# Patient Record
Sex: Female | Born: 1940 | Race: White | Hispanic: No | Marital: Married | Smoking: Never smoker
Health system: Southern US, Community
[De-identification: ages and names within clinical notes are randomized; demographics above are authoritative.]

## PROBLEM LIST (undated history)

## (undated) DIAGNOSIS — E785 Hyperlipidemia, unspecified: Secondary | ICD-10-CM

## (undated) DIAGNOSIS — G4733 Obstructive sleep apnea (adult) (pediatric): Secondary | ICD-10-CM

## (undated) DIAGNOSIS — I4819 Other persistent atrial fibrillation: Secondary | ICD-10-CM

## (undated) DIAGNOSIS — E119 Type 2 diabetes mellitus without complications: Secondary | ICD-10-CM

## (undated) DIAGNOSIS — I38 Endocarditis, valve unspecified: Secondary | ICD-10-CM

## (undated) DIAGNOSIS — E669 Obesity, unspecified: Secondary | ICD-10-CM

## (undated) DIAGNOSIS — F5104 Psychophysiologic insomnia: Secondary | ICD-10-CM

## (undated) DIAGNOSIS — Z9989 Dependence on other enabling machines and devices: Secondary | ICD-10-CM

## (undated) DIAGNOSIS — E781 Pure hyperglyceridemia: Secondary | ICD-10-CM

## (undated) DIAGNOSIS — F419 Anxiety disorder, unspecified: Secondary | ICD-10-CM

## (undated) DIAGNOSIS — I10 Essential (primary) hypertension: Secondary | ICD-10-CM

## (undated) HISTORY — DX: Essential (primary) hypertension: I10

## (undated) HISTORY — DX: Other persistent atrial fibrillation: I48.19

## (undated) HISTORY — DX: Endocarditis, valve unspecified: I38

## (undated) HISTORY — DX: Obesity, unspecified: E66.9

## (undated) HISTORY — PX: CATARACT EXTRACTION W/ INTRAOCULAR LENS IMPLANT: SHX1309

## (undated) HISTORY — DX: Hyperlipidemia, unspecified: E78.5

## (undated) HISTORY — DX: Pure hyperglyceridemia: E78.1

---

## 1969-09-08 HISTORY — PX: APPENDECTOMY: SHX54

## 1983-09-09 HISTORY — PX: PLACEMENT OF BREAST IMPLANTS: SHX6334

## 1999-11-25 ENCOUNTER — Encounter: Payer: Self-pay | Admitting: Emergency Medicine

## 1999-11-25 ENCOUNTER — Emergency Department (HOSPITAL_COMMUNITY): Admission: EM | Admit: 1999-11-25 | Discharge: 1999-11-25 | Payer: Self-pay | Admitting: Emergency Medicine

## 2002-03-18 ENCOUNTER — Emergency Department (HOSPITAL_COMMUNITY): Admission: EM | Admit: 2002-03-18 | Discharge: 2002-03-18 | Payer: Self-pay | Admitting: Emergency Medicine

## 2002-03-18 ENCOUNTER — Encounter: Payer: Self-pay | Admitting: Emergency Medicine

## 2003-01-20 ENCOUNTER — Other Ambulatory Visit: Admission: RE | Admit: 2003-01-20 | Discharge: 2003-01-20 | Payer: Self-pay | Admitting: Internal Medicine

## 2003-09-17 ENCOUNTER — Emergency Department (HOSPITAL_COMMUNITY): Admission: EM | Admit: 2003-09-17 | Discharge: 2003-09-17 | Payer: Self-pay | Admitting: Emergency Medicine

## 2005-11-15 ENCOUNTER — Emergency Department (HOSPITAL_COMMUNITY): Admission: EM | Admit: 2005-11-15 | Discharge: 2005-11-15 | Payer: Self-pay | Admitting: Emergency Medicine

## 2006-07-09 ENCOUNTER — Encounter: Admission: RE | Admit: 2006-07-09 | Discharge: 2006-07-09 | Payer: Self-pay | Admitting: Family Medicine

## 2006-12-20 ENCOUNTER — Emergency Department (HOSPITAL_COMMUNITY): Admission: EM | Admit: 2006-12-20 | Discharge: 2006-12-20 | Payer: Self-pay | Admitting: Emergency Medicine

## 2011-04-30 DIAGNOSIS — I38 Endocarditis, valve unspecified: Secondary | ICD-10-CM

## 2011-04-30 HISTORY — DX: Endocarditis, valve unspecified: I38

## 2013-03-14 ENCOUNTER — Encounter: Payer: Self-pay | Admitting: *Deleted

## 2013-03-15 ENCOUNTER — Encounter: Payer: Self-pay | Admitting: Cardiovascular Disease

## 2013-03-15 ENCOUNTER — Ambulatory Visit: Payer: Self-pay | Admitting: Cardiovascular Disease

## 2013-03-22 ENCOUNTER — Ambulatory Visit (INDEPENDENT_AMBULATORY_CARE_PROVIDER_SITE_OTHER): Payer: Self-pay | Admitting: Pharmacist Clinician (PhC)/ Clinical Pharmacy Specialist

## 2013-03-22 ENCOUNTER — Ambulatory Visit (INDEPENDENT_AMBULATORY_CARE_PROVIDER_SITE_OTHER): Payer: Medicare Other | Admitting: Cardiovascular Disease

## 2013-03-22 ENCOUNTER — Encounter: Payer: Self-pay | Admitting: Cardiovascular Disease

## 2013-03-22 VITALS — BP 170/100 | HR 84 | Ht 65.0 in | Wt 206.3 lb

## 2013-03-22 DIAGNOSIS — I480 Paroxysmal atrial fibrillation: Secondary | ICD-10-CM

## 2013-03-22 DIAGNOSIS — I4891 Unspecified atrial fibrillation: Secondary | ICD-10-CM

## 2013-03-22 DIAGNOSIS — E782 Mixed hyperlipidemia: Secondary | ICD-10-CM

## 2013-03-22 DIAGNOSIS — I10 Essential (primary) hypertension: Secondary | ICD-10-CM

## 2013-03-22 DIAGNOSIS — Z7901 Long term (current) use of anticoagulants: Secondary | ICD-10-CM

## 2013-03-22 DIAGNOSIS — Z5181 Encounter for therapeutic drug level monitoring: Secondary | ICD-10-CM

## 2013-03-22 DIAGNOSIS — E119 Type 2 diabetes mellitus without complications: Secondary | ICD-10-CM

## 2013-03-22 MED ORDER — OMEGA-3-ACID ETHYL ESTERS 1 G PO CAPS: 1.0000 g | ORAL_CAPSULE | Freq: Two times a day (BID) | ORAL | Status: DC

## 2013-03-22 MED ORDER — METOPROLOL SUCCINATE ER 100 MG PO TB24: ORAL_TABLET | ORAL | Status: DC

## 2013-03-22 NOTE — Patient Instructions (Signed)
Your physician has requested that you have an echocardiogram. Echocardiography is a painless test that uses sound waves to create images of your heart. It provides your doctor with information about the size and shape of your heart and how well your heart's chambers and valves are working. This procedure takes approximately one hour. There are no restrictions for this procedure.  Your physician has recommended that you have a sleep study. This test records several body functions during sleep, including: brain activity, eye movement, oxygen and carbon dioxide blood levels, heart rate and rhythm, breathing rate and rhythm, the flow of air through your mouth and nose, snoring, body muscle movements, and chest and belly movement.  Your physician recommends that you return for lab work. Your physician recommends that you schedule a follow-up appointment in: 4 WEEKS. Your physician has recommended you make the following change in your medication: increase your metoprolol as directed- 100mg  in the AM and 50 mg in the PM. INCREASE YOUR FISH OIL TO 2 TWICE DAILY.

## 2013-03-23 ENCOUNTER — Ambulatory Visit (HOSPITAL_COMMUNITY)
Admission: RE | Admit: 2013-03-23 | Discharge: 2013-03-23 | Disposition: A | Discharge status: Home / Self Care | Payer: Medicare Other | Source: Ambulatory Visit | Attending: Internal Medicine | Admitting: Internal Medicine

## 2013-03-23 DIAGNOSIS — Z8249 Family history of ischemic heart disease and other diseases of the circulatory system: Secondary | ICD-10-CM | POA: Insufficient documentation

## 2013-03-23 DIAGNOSIS — I4891 Unspecified atrial fibrillation: Secondary | ICD-10-CM | POA: Insufficient documentation

## 2013-03-23 LAB — TSH: TSH: 2.71 u[IU]/mL (ref 0.350–4.500)

## 2013-03-23 LAB — CBC
HCT: 41.1 % (ref 36.0–46.0)
Hemoglobin: 13.7 g/dL (ref 12.0–15.0)
MCHC: 33.3 g/dL (ref 30.0–36.0)

## 2013-03-23 LAB — COMPREHENSIVE METABOLIC PANEL
AST: 22 U/L (ref 0–37)
Albumin: 4.6 g/dL (ref 3.5–5.2)
BUN: 15 mg/dL (ref 6–23)
CO2: 26 mEq/L (ref 19–32)
Creat: 0.76 mg/dL (ref 0.50–1.10)
Total Protein: 7.4 g/dL (ref 6.0–8.3)

## 2013-03-23 NOTE — Progress Notes (Signed)
2D Echo Performed 03/23/2013    Clearence Ped, RCS

## 2013-03-24 LAB — NMR LIPOPROFILE WITH LIPIDS
Cholesterol, Total: 232 mg/dL — ABNORMAL HIGH
HDL Particle Number: 32.3 umol/L
HDL Size: 8.4 nm — ABNORMAL LOW
HDL-C: 42 mg/dL
LDL Particle Number: 1796 nmol/L — ABNORMAL HIGH
LDL Size: 19.7 nm — ABNORMAL LOW
LP-IR Score: 82 — ABNORMAL HIGH
Large HDL-P: <1.3 umol/L — ABNORMAL LOW
Large VLDL-P: 19.6 nmol/L — ABNORMAL HIGH
Small LDL Particle Number: 1442 nmol/L — ABNORMAL HIGH
Triglycerides: 486 mg/dL — ABNORMAL HIGH
VLDL Size: 49.3 nm — ABNORMAL HIGH

## 2013-03-25 ENCOUNTER — Telehealth: Payer: Self-pay | Admitting: Cardiovascular Disease

## 2013-03-25 NOTE — Telephone Encounter (Signed)
Dr. Tresa Endo I saw this message at 6:45 can you please address this. I didn't see your office note. Only my patient instructions.

## 2013-03-25 NOTE — Telephone Encounter (Signed)
Pt is still feeling like she is in A-fib, she saw Dr. Tresa Endo on Monday and would like for a call back today being it is Friday.

## 2013-03-28 ENCOUNTER — Telehealth: Payer: Self-pay | Admitting: Cardiovascular Disease

## 2013-03-28 NOTE — Telephone Encounter (Signed)
Amy Wright wants to know if we have her ECHO and lab results yet.

## 2013-03-29 ENCOUNTER — Encounter: Payer: Self-pay | Admitting: Cardiovascular Disease

## 2013-03-29 ENCOUNTER — Telehealth: Payer: Self-pay | Admitting: *Deleted

## 2013-03-29 DIAGNOSIS — E119 Type 2 diabetes mellitus without complications: Secondary | ICD-10-CM | POA: Insufficient documentation

## 2013-03-29 DIAGNOSIS — I10 Essential (primary) hypertension: Secondary | ICD-10-CM | POA: Insufficient documentation

## 2013-03-29 DIAGNOSIS — E782 Mixed hyperlipidemia: Secondary | ICD-10-CM | POA: Insufficient documentation

## 2013-03-29 DIAGNOSIS — I4819 Other persistent atrial fibrillation: Secondary | ICD-10-CM | POA: Insufficient documentation

## 2013-03-29 DIAGNOSIS — Z7901 Long term (current) use of anticoagulants: Secondary | ICD-10-CM | POA: Insufficient documentation

## 2013-03-29 NOTE — Progress Notes (Signed)
Patient ID: Amy Wright, female   DOB: [birth year], 72 y.o.   MRN: [id number]     HPI: Amy Wright, is a 72 y.o. female who presents for cardiology evaluation. I last saw her 5 months ago.  Amy Wright has a history of paroxysmal atrial fibrillation and is on chronic Coumadin therapy. Has a history of type 2 diabetes mellitus, mixed hyperlipidemia with marked hypertriglyceridemia, as well as what valve sclerosis. An echo Doppler study in 2012 showed normal systolic function with mild to moderate mitral regurgitation and mild mitral annular calcification, aortic valve sclerosis without stenosis. Remotely, triglycerides have been in excess of 500. Laboratory in December at [facility] showed improvement but still markedly abnormal lipids with a total cholesterol 186 triglycerides 406 HDL 42 VLDL 81 and non-HDL cholesterol 144. When I last saw her she was taken to lipids one 35 mg daily. I increased her Levoxyl to 2 capsules twice a day and recommended a minimum of 30 pound weight loss. She did have followup laboratory on 01/06/2013 at [facility] by Dr. Riley Nearing showed a total cholesterol 202 triglycerides 503 HDL 42 VLDL 101 and non-HDL cholesterol 160. Direct LDL was 105. Chemistries were normal.  She states she was started on diabetic drug at that time and did not do well with it.  She has noticed some irregularity to her heart rhythm. She denies chest pain she does difficulty with sleep. She wakes up multiple times per night. She feels drained and her sleep is nonrestorative. She apparently had never increased her lovaza and was only taking one capsule a day.  Apparently, patient noted regularity to her heart rate this past Saturday. EMS was apparently called to her home. She did not have an emergency room evaluation. She presents now for cardiology evaluation.  Past Medical History  Diagnosis Date  . Arrhythmia     Atrial fibrillation  . Hyperlipidemia   . Hypertriglyceridemia   . Valvular  sclerosis 04/30/11    ECHO-Mitral Valve- mild to moderate mitral regurgitation. Central jet. Aortic Valve appears to be mildly sclerotic. Trace aortic regurgitation.EF >55%  . Diabetes mellitus without complication     type 2    Past Surgical History  Procedure Laterality Date  . Cesarean section  1971  . Breast enhancement surgery  1985    Not on File  Current Outpatient Prescriptions  Medication Sig Dispense Refill  . Ascorbic Acid (VITAMIN C) 1000 MG tablet Take 1,000 mg by mouth. 3 tablets daily      . Canagliflozin (INVOKANA) 300 MG TABS Take 1 tablet by mouth daily.      . Cholecalciferol (VITAMIN D-3 PO) Take by mouth.      . Choline Fenofibrate (TRILIPIX) 135 MG capsule Take 135 mg by mouth daily.      . digoxin (LANOXIN) 0.25 MG tablet Take 0.25 mg by mouth daily.      Marland Kitchen diltiazem (CARDIZEM CD) 240 MG 24 hr capsule       . ezetimibe (ZETIA) 10 MG tablet Take 10 mg by mouth daily.      . Fenofibrate 120 MG TABS Take by mouth.      . folic acid (FOLVITE) 1 MG tablet Take 1 mg by mouth daily.      Marland Kitchen glipiZIDE (GLUCOTROL) 10 MG tablet Take 10 mg by mouth daily.      Marland Kitchen losartan-hydrochlorothiazide (HYZAAR) 100-25 MG per tablet Take 1 tablet by mouth daily.      . metFORMIN (GLUCOPHAGE) 1000 MG tablet Take 1,000 mg  by mouth 2 (two) times daily with a meal.      . metoprolol succinate (TOPROL-XL) 100 MG 24 hr tablet Take 100 mg by mouth daily. Take with or immediately following a meal.      . omega-3 acid ethyl esters (LOVAZA) 1 G capsule       . sertraline (ZOLOFT) 100 MG tablet Take 100 mg by mouth daily.      . vitamin B-12 (CYANOCOBALAMIN) 100 MCG tablet Take 50 mcg by mouth daily.      Marland Kitchen warfarin (COUMADIN) 2.5 MG tablet Take 2.5 mg by mouth daily. Alt with 5 mg.      . zolpidem (AMBIEN CR) 12.5 MG CR tablet Take 12.5 mg by mouth at bedtime as needed for sleep.      . metoprolol succinate (TOPROL-XL) 100 MG 24 hr tablet Take 1 pill in the morning and 1/2 pill at night.  270  tablet  3  . omega-3 acid ethyl esters (LOVAZA) 1 G capsule Take 1 capsule (1 g total) by mouth 2 (two) times daily. Take 2 capsules  240 capsule  3   No current facility-administered medications for this visit.    Socially she is married has 3 children 6 grandchildren. Tobacco. She does not routinely exercise but are use occasional alcohol.  ROS is negative for fevers, chills or night sweats. There is no significant weight loss. She does admit to fatigue, nonrestorative sleep. She has noticed an irregularity to her heart rhythm. She denies chest pain. There is mild shortness of breath with activity. Her diabetes has not been well-controlled. He denies significant edema. She denies bleeding  Other system review is negative.  PE BP 170/100  Pulse 84  Ht 5\' 5"  (1.651 m)  Wt 206 lb 4.8 oz (93.577 kg)  BMI 34.33 kg/m2  General: Alert, oriented, no distress.  Skin: normal turgor, no rashes HEENT: Normocephalic, atraumatic. Pupils round and reactive; sclera anicteric;no lid lag.  Nose without nasal septal hypertrophy Mouth/Parynx benign; Mallinpatti scale 3 Neck: No JVD, no carotid briuts Lungs: clear to ausculatation and percussion; no wheezing or rales Heart: Irregularly irregular with ventricular rate in the 80s to 90s.  s1 s2 normal 1/6 systolic murmur. Abdomen: soft, nontender; no hepatosplenomehaly, BS+; abdominal aorta nontender and not dilated by palpation. Pulses 2+ Extremities: no clubbing cyanosis or edema, Homan's sign negative  Neurologic: grossly nonfocal  ECG: Atrial fibrillation at 84 beats per minute; non-specific ST-T changes which may be contributed by Lanoxin effect.  LABS:  BMET    Component Value Date/Time   NA 137 03/23/2013 1105   K 4.7 03/23/2013 1105   CL 99 03/23/2013 1105   CO2 26 03/23/2013 1105   GLUCOSE 210* 03/23/2013 1105   BUN 15 03/23/2013 1105   CREATININE 0.76 03/23/2013 1105   CALCIUM 9.9 03/23/2013 1105     Hepatic Function Panel     Component  Value Date/Time   PROT 7.4 03/23/2013 1105   ALBUMIN 4.6 03/23/2013 1105   AST 22 03/23/2013 1105   ALT 19 03/23/2013 1105   ALKPHOS 37* 03/23/2013 1105   BILITOT 0.4 03/23/2013 1105     CBC    Component Value Date/Time   WBC 8.6 03/23/2013 1105   RBC 5.14* 03/23/2013 1105   HGB 13.7 03/23/2013 1105   HCT 41.1 03/23/2013 1105   PLT 470* 03/23/2013 1105   MCV 80.0 03/23/2013 1105   MCH 26.7 03/23/2013 1105   MCHC 33.3 03/23/2013 1105   RDW 15.0  03/23/2013 1105     BNP No results found for this basename: probnp    Lipid Panel     Component Value Date/Time   TRIG 486* 03/23/2013 1105   LDLCALC NOT CALC 03/23/2013 1105     RADIOLOGY: No results found.    ASSESSMENT AND PLAN: Amy Wright is an 16 -year-old who has a history of paroxysmal atrial fibrillation and has been on Coumadin anticoagulation therapy.  EKG today confirms atrial fibrillation with a ventricular rate in the 80s. He HR  significantly increased over the weekend leading to an EMS evaluation.  She is continued to have poorly controlled diabetes mellitus and marked hyperlipidemia with significant elevation of her triglycerides. She also has significant issues with sleep and is very possible she may have obstructive sleep apnea which may be contributory to her atrial fibrillation. Of note, she has a twin sister who does have sleep apnea and is on CPAP therapy. We discussed data regarding sleep apnea and if left untreated the significant increased risk of recurrent AF. Rezulin, and recommending a complete set of laboratory be checked consistently CBC, CMP, magnesium level, TSH, and NMR glycosides profile. Of scheduling her for a 2-D echo Doppler study as well as a sleep study.  Adjustments will be made accordingly to her medications once laboratories results are available. In the interim I recommended she increase her Lovaza to 2 capsules twice a day. I'm also further titrate her Toprol to 100 mg in the morning and 50 mg at bedtime.   I will see her back in the office in 4 weeks for followup evaluation.      Lennette Bihari, MD, [facility]  03/29/2013 9:22 AM

## 2013-03-29 NOTE — Telephone Encounter (Signed)
Message copied by Vita Barley on Tue Mar 29, 2013 12:18 PM ------      Message from: Nicki Guadalajara A      Created: Mon Mar 28, 2013 10:43 AM       Echo wnl;  Increased LDL-P can she take a statin; if so strart atorva 20.  Is she on fenofibrate already? ------

## 2013-03-29 NOTE — Telephone Encounter (Signed)
Just finished talking to you-would you please send her a copy of all the results you just gave you.

## 2013-03-29 NOTE — Telephone Encounter (Signed)
Echo and lab results called to pt.  Already taking Fenofibrate and Zetia.  State she is unable to take statins due to myalgias. Dr. Tresa Endo will discuss labs in more detail at visit.  Pt voiced understanding.

## 2013-04-08 NOTE — Telephone Encounter (Signed)
Spoke with patient to ask what copies she is requesting. She informs me that [place] sent her copies of her recent labs and echo.

## 2013-04-19 ENCOUNTER — Ambulatory Visit (INDEPENDENT_AMBULATORY_CARE_PROVIDER_SITE_OTHER): Payer: Medicare Other | Admitting: Cardiovascular Disease

## 2013-04-19 ENCOUNTER — Encounter: Payer: Self-pay | Admitting: Cardiovascular Disease

## 2013-04-19 VITALS — BP 148/86 | HR 49 | Ht 65.0 in | Wt 204.6 lb

## 2013-04-19 DIAGNOSIS — I4891 Unspecified atrial fibrillation: Secondary | ICD-10-CM

## 2013-04-19 MED ORDER — PROPAFENONE HCL 150 MG PO TABS: 150.0000 mg | ORAL_TABLET | Freq: Three times a day (TID) | ORAL | Status: DC

## 2013-04-19 NOTE — Patient Instructions (Addendum)
Your physician has recommended you make the following change in your medication: Cut your digoxin into 1/2. Take once daily. Start new prescription given for propafenone 150mg . This has already been sent to your pharmacy.   Your physician recommends that you schedule a follow-up appointment in: 2 WEEKS

## 2013-04-24 ENCOUNTER — Encounter: Payer: Self-pay | Admitting: Cardiovascular Disease

## 2013-04-24 NOTE — Progress Notes (Signed)
Patient ID: Amy Wright, female   DOB: 1940/11/10, 72 y.o.   MRN: [id number]     HPI: Amy Wright, is a 72 y.o. female who presents for cardiology evaluation. I last saw her one month ago with complaints of palpitations and was found to be in atrial fibrillation.  Ms. Ricotta has a history of paroxysmal atrial fibrillation and has been on chronic Coumadin therapy. Additional problems include type 2 diabetes mellitus, mixed hyperlipidemia with marked hypertriglyceridemia, as well as aortic valve sclerosis. An echo Doppler study in 2012 showed normal systolic function with mild to moderate mitral regurgitation and mild mitral annular calcification, aortic valve sclerosis without stenosis. Remotely, triglycerides have been in excess of 500. Laboratory in December at [facility] showed improvement but still markedly abnormal lipids with a total cholesterol 186 triglycerides 406 HDL 42 VLDL 81 and non-HDL cholesterol 144. When I last saw her she was taken to lipids one 35 mg daily. I increased her Levoxyl to 2 capsules twice a day and recommended a minimum of 30 pound weight loss. She did have followup laboratory on 01/06/2013 at [facility] by Dr. Riley Nearing showed a total cholesterol 202 triglycerides 503 HDL 42 VLDL 101 and non-HDL cholesterol 160. Direct LDL was 105. Chemistries were normal.  She states she was started on diabetic drug at that time and did not do well with it.  She has noticed some irregularity to her heart rhythm. She denies chest pain she does difficulty with sleep. She wakes up multiple times per night. She feels drained and her sleep is nonrestorative. She apparently had never increased her lovaza and was only taking one capsule a day.  Recently, the patient had experienced episodes of increasing heart rates and irregularity. In July. EMS was apparently called to her home. I saw her on July 15. 2014 and her EKG at that time showed atrial fibrillation at 84 beats per minute. At that  time, I increased her Toprol to 150 mg in the morning and 100 mg at night. I was also concerned about the possibility of his obstructive sleep apnea and discussed with her the implications that if she does have sleep apnea the likelihood that this can lead to recurrent atrial fibrillation following cardioversion. I did recommend she undergo an echo Doppler study as well as undergo a complete set of laboratory. She presents now for evaluation.  Of note, she states she did not schedule her sleep study. She still notes her heart rate is irregular. She has been taking Coumadin anticoagulation. Her echo Doppler was done on 03/23/2013 and showed an ejection fraction of 55-60% without wall motion abnormalities. She did have mitral annular calcification with mild MR. Her left atrium was upper normal in size. She did not have evidence for pulmonary hypertension. She presents for evaluation.  Past Medical History  Diagnosis Date  . Arrhythmia     Atrial fibrillation  . Hyperlipidemia   . Hypertriglyceridemia   . 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 Central jet. Aortic Valve appears to be mildly sclerotic. Trace aortic regurgitation.EF >55%  . Diabetes mellitus without complication     type 2    Past Surgical History  Procedure Laterality Date  . Cesarean section  1971  . Breast enhancement surgery  1985    Allergies  Allergen Reactions  . Statins     myalgias    Current Outpatient Prescriptions  Medication Sig Dispense Refill  . amoxicillin-clavulanate (AUGMENTIN) 875-125 MG per tablet Take 1  tablet by mouth 2 (two) times daily.      . Ascorbic Acid (VITAMIN C) 1000 MG tablet Take 1,000 mg by mouth. 3 tablets daily      . Canagliflozin (INVOKANA) 300 MG TABS Take 1 tablet by mouth daily.      . Cholecalciferol (VITAMIN D-3 PO) Take by mouth.      . Choline Fenofibrate (TRILIPIX) 135 MG capsule Take 135 mg by mouth daily.      . digoxin  (LANOXIN) 0.25 MG tablet Take 0.25 mg by mouth daily.      Marland Kitchen diltiazem (CARDIZEM CD) 240 MG 24 hr capsule       . ezetimibe (ZETIA) 10 MG tablet Take 10 mg by mouth daily.      . Fenofibrate 120 MG TABS Take by mouth.      . folic acid (FOLVITE) 1 MG tablet Take 1 mg by mouth daily.      Marland Kitchen glipiZIDE (GLUCOTROL) 10 MG tablet Take 10 mg by mouth daily.      Marland Kitchen LORazepam (ATIVAN) 0.5 MG tablet Take 1 tablet by mouth as needed.      Marland Kitchen losartan-hydrochlorothiazide (HYZAAR) 100-25 MG per tablet Take 1 tablet by mouth daily.      . metFORMIN (GLUCOPHAGE) 1000 MG tablet Take 1,000 mg by mouth 2 (two) times daily with a meal.      . metoprolol succinate (TOPROL-XL) 100 MG 24 hr tablet Take 1 pill in the morning and 1/2 pill at night.  270 tablet  3  . omega-3 acid ethyl esters (LOVAZA) 1 G capsule Take 1 capsule (1 g total) by mouth 2 (two) times daily. Take 2 capsules  240 capsule  3  . sertraline (ZOLOFT) 100 MG tablet Take 100 mg by mouth daily.      . vitamin B-12 (CYANOCOBALAMIN) 100 MCG tablet Take 50 mcg by mouth daily.      Marland Kitchen zolpidem (AMBIEN CR) 12.5 MG CR tablet Take 12.5 mg by mouth at bedtime as needed for sleep.      . propafenone (RYTHMOL) 150 MG tablet Take 1 tablet (150 mg total) by mouth every 8 (eight) hours.  90 tablet  6  . warfarin (COUMADIN) 2.5 MG tablet Take 2.5 mg by mouth daily. Alt with 5 mg.       No current facility-administered medications for this visit.    Socially she is married has 3 children 6 grandchildren. Tobacco. She does not routinely exercise but are use occasional alcohol.  ROS is negative for fevers, chills or night sweats. There is no significant weight loss. She does admit to fatigue, nonrestorative sleep. She has noticed an irregularity to her heart rhythm. She denies chest pain. There is mild shortness of breath with activity. Her diabetes has not been well-controlled. He denies significant edema. She denies bleeding  Other system review is  negative.  PE BP 148/86  Pulse 49  Ht 5\' 5"  (1.651 m)  Wt 204 lb 9.6 oz (92.806 kg)  BMI 34.05 kg/m2  General: Alert, oriented, no distress.  Skin: normal turgor, no rashes HEENT: Normocephalic, atraumatic. Pupils round and reactive; sclera anicteric;no lid lag.  Nose without nasal septal hypertrophy Mouth/Parynx benign; Mallinpatti scale 3 Neck: No JVD, no carotid briuts Lungs: clear to ausculatation and percussion; no wheezing or rales Heart: Irregularly irregular with ventricular rate in the 80s to 90s.  s1 s2 normal 1/6 systolic murmur. Abdomen: soft, nontender; no hepatosplenomehaly, BS+; abdominal aorta nontender and not dilated by palpation.  Pulses 2+ Extremities: no clubbing cyanosis or edema, Homan's sign negative  Neurologic: grossly nonfocal  ECG: Atrial fibrillation at 60 - 70s beats per minute; non-specific ST-T changes which may be contributed by Lanoxin effect.  LABS:  BMET    Component Value Date/Time   NA 137 03/23/2013 1105   K 4.7 03/23/2013 1105   CL 99 03/23/2013 1105   CO2 26 03/23/2013 1105   GLUCOSE 210* 03/23/2013 1105   BUN 15 03/23/2013 1105   CREATININE 0.76 03/23/2013 1105   CALCIUM 9.9 03/23/2013 1105     Hepatic Function Panel     Component Value Date/Time   PROT 7.4 03/23/2013 1105   ALBUMIN 4.6 03/23/2013 1105   AST 22 03/23/2013 1105   ALT 19 03/23/2013 1105   ALKPHOS 37* 03/23/2013 1105   BILITOT 0.4 03/23/2013 1105     CBC    Component Value Date/Time   WBC 8.6 03/23/2013 1105   RBC 5.14* 03/23/2013 1105   HGB 13.7 03/23/2013 1105   HCT 41.1 03/23/2013 1105   PLT 470* 03/23/2013 1105   MCV 80.0 03/23/2013 1105   MCH 26.7 03/23/2013 1105   MCHC 33.3 03/23/2013 1105   RDW 15.0 03/23/2013 1105     BNP No results found for this basename: probnp    Lipid Panel     Component Value Date/Time   TRIG 486* 03/23/2013 1105   LDLCALC NOT CALC 03/23/2013 1105     RADIOLOGY: No results found.    ASSESSMENT AND PLAN: Ms. Deahl is an 73  -year-old who has a history of paroxysmal atrial fibrillation and has been on Coumadin anticoagulation therapy.  She remains in atrial fibrillation despite increased beta blocker therapy. Her echo Doppler study confirms normal systolic function and upper normal left atrial dimensions. She remains in atrial fibrillation. At this point, I'm recommending she initiate Propafanone 150 mg every 8 hours in attempt to convert her into sinus rhythm. I will decrease her digoxin dose by one half and she was instructed to reduce toprol depending on heart rate.  I again discussed the importance of undergoing evaluation for probable sleep apnea which may be contributory to her atrial fibrillation. We discussed the doubling risk of recurrent AF following cardioversion if her sleep apnea is not treated. I will see her back in the office in 2 weeks for followup evaluation following initiation of Propafenone and at that time she continues to be nature fibrillation and has been continued to have therapeutic anticoagulation we will make plans for attempt at outpatient DC cardioversion.     Lennette Bihari, MD, [facility]  04/24/2013 2:21 PM

## 2013-05-05 ENCOUNTER — Ambulatory Visit (INDEPENDENT_AMBULATORY_CARE_PROVIDER_SITE_OTHER): Payer: Medicare Other | Admitting: Cardiovascular Disease

## 2013-05-05 ENCOUNTER — Telehealth: Payer: Self-pay | Admitting: Cardiovascular Disease

## 2013-05-05 ENCOUNTER — Encounter: Payer: Self-pay | Admitting: Cardiovascular Disease

## 2013-05-05 VITALS — BP 130/80 | HR 93 | Ht 65.0 in | Wt 206.5 lb

## 2013-05-05 DIAGNOSIS — Z5181 Encounter for therapeutic drug level monitoring: Secondary | ICD-10-CM

## 2013-05-05 DIAGNOSIS — Z7901 Long term (current) use of anticoagulants: Secondary | ICD-10-CM

## 2013-05-05 DIAGNOSIS — I480 Paroxysmal atrial fibrillation: Secondary | ICD-10-CM

## 2013-05-05 DIAGNOSIS — E119 Type 2 diabetes mellitus without complications: Secondary | ICD-10-CM

## 2013-05-05 DIAGNOSIS — E782 Mixed hyperlipidemia: Secondary | ICD-10-CM

## 2013-05-05 DIAGNOSIS — I4891 Unspecified atrial fibrillation: Secondary | ICD-10-CM

## 2013-05-05 DIAGNOSIS — I10 Essential (primary) hypertension: Secondary | ICD-10-CM

## 2013-05-05 MED ORDER — PROPAFENONE HCL 225 MG PO TABS: 225.0000 mg | ORAL_TABLET | Freq: Three times a day (TID) | ORAL | Status: DC

## 2013-05-05 MED ORDER — LORAZEPAM 0.5 MG PO TABS: 0.5000 mg | ORAL_TABLET | ORAL | Status: DC | PRN

## 2013-05-05 NOTE — Telephone Encounter (Signed)
Returning your call. °

## 2013-05-05 NOTE — Patient Instructions (Signed)
Your physician has recommended you make the following change in your medication: DECREASE the metoprolol to 50 mg twice daily (1/2 tablet of the 100 mg)  STOP the propafenone 150 mg AND start the new prescription sent to the pharmacy for the 225mg . Your physician recommends that you schedule a follow-up appointment in: 3-4 weeks.

## 2013-05-07 ENCOUNTER — Encounter: Payer: Self-pay | Admitting: Cardiovascular Disease

## 2013-05-07 NOTE — Progress Notes (Signed)
Patient ID: Amy Wright, female   DOB: [birth year], 72 y.o.   MRN: [id number]     HPI: Amy Wright, is a 72 y.o. female who presents for cardiology evaluation of her recent atrial fibrillation.  Amy Wright has a history of paroxysmal atrial fibrillation and has been on chronic Coumadin therapy. Additional problems include type 2 diabetes mellitus, mixed hyperlipidemia with marked hypertriglyceridemia, as well as aortic valve sclerosis. An echo Doppler study in 2012 showed normal systolic function with mild to moderate mitral regurgitation and mild mitral annular calcification, aortic valve sclerosis without stenosis. She has a history of significant mixed hyperlipidemia and triglycerides have been in excess of 500. Laboratory in December at [facility] showed improvement but still markedly abnormal lipids with a total cholesterol 186 triglycerides 406 HDL 42 VLDL 81 and non-HDL cholesterol 144. When I last saw her she was taken to lipids one 35 mg daily. I increased her Lovaza 2 capsules twice a day and recommended a minimum of 30 pound weight loss. Followup laboratory  On 01/06/2013 at [facility] by Dr. Riley Nearing showed a total cholesterol 202 triglycerides 503 HDL 42 VLDL 101 and non-HDL cholesterol 160. Direct LDL was 105. Chemistries were normal. In July she was found to be in atrial fibrillation.  When I saw her I increased her Toprol to 150 mg in the morning and 100 mg at night. I was also concerned about the possibility of his obstructive sleep apnea and discussed with her the implications that if she does have sleep apnea the likelihood that this can lead to recurrent atrial fibrillation following cardioversion. I did recommend she undergo an echo Doppler study as well as undergo a complete set of laboratory. She presents now for evaluation. Amy Wright has not scheduled her sleep study. She still notes her heart rate is irregular. She has been taking Coumadin anticoagulation. Her echo Doppler was  done on 03/23/2013 and showed an ejection fraction of 55-60% without wall motion abnormalities. She did have mitral annular calcification with mild MR. Her left atrium was upper normal in size. She did not have evidence for pulmonary hypertension. When I saw her several weeks ago further discussion was made concerning attempt at restoration of sinus rhythm. At that time, he started off and on 150 mg every 8 hours. She has felt improved since taking this medication. She is unaware of any significant tachycardia palpitations. She presents for followup evaluation.  Past Medical History  Diagnosis Date  . Arrhythmia     Atrial fibrillation  . Hyperlipidemia   . Hypertriglyceridemia   . 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 Central jet. Aortic Valve appears to be mildly sclerotic. Trace aortic regurgitation.EF >55%  . Diabetes mellitus without complication     type 2    Past Surgical History  Procedure Laterality Date  . Cesarean section  1971  . Breast enhancement surgery  1985    Allergies  Allergen Reactions  . Statins     myalgias    Current Outpatient Prescriptions  Medication Sig Dispense Refill  . Ascorbic Acid (VITAMIN C) 1000 MG tablet Take 1,000 mg by mouth. 3 tablets daily      . Canagliflozin (INVOKANA) 300 MG TABS Take 1 tablet by mouth daily.      . Cholecalciferol (VITAMIN D-3 PO) Take by mouth.      . Choline Fenofibrate (TRILIPIX) 135 MG capsule Take 135 mg by mouth daily.      . digoxin (LANOXIN) 0.25  MG tablet Take 0.25 mg by mouth daily. Takes 1/2 tablet      . diltiazem (CARDIZEM CD) 240 MG 24 hr capsule Take 240 mg by mouth daily.       Marland Kitchen ezetimibe (ZETIA) 10 MG tablet Take 10 mg by mouth daily.      . Fenofibrate 120 MG TABS Take by mouth.      . folic acid (FOLVITE) 1 MG tablet Take 1 mg by mouth daily.      Marland Kitchen glipiZIDE (GLUCOTROL) 10 MG tablet Take 10 mg by mouth daily.      Marland Kitchen LORazepam (ATIVAN) 0.5 MG tablet  Take 1 tablet (0.5 mg total) by mouth as needed.  30 tablet  0  . losartan-hydrochlorothiazide (HYZAAR) 100-25 MG per tablet Take 1 tablet by mouth daily.      . metFORMIN (GLUCOPHAGE) 1000 MG tablet Take 1,000 mg by mouth 2 (two) times daily with a meal.      . metoprolol succinate (TOPROL-XL) 100 MG 24 hr tablet Take 1 pill in the morning and 1/2 pill at night.  270 tablet  3  . omega-3 acid ethyl esters (LOVAZA) 1 G capsule Take 1 capsule (1 g total) by mouth 2 (two) times daily. Take 2 capsules  240 capsule  3  . sertraline (ZOLOFT) 100 MG tablet Take 100 mg by mouth daily.      . vitamin B-12 (CYANOCOBALAMIN) 100 MCG tablet Take 50 mcg by mouth daily.      Marland Kitchen warfarin (COUMADIN) 2.5 MG tablet Take 2.5 mg by mouth daily. Alt with 5 mg.      . zolpidem (AMBIEN CR) 12.5 MG CR tablet Take 12.5 mg by mouth at bedtime as needed for sleep.      . propafenone (RYTHMOL) 225 MG tablet Take 1 tablet (225 mg total) by mouth every 8 (eight) hours.  90 tablet  6   No current facility-administered medications for this visit.    Socially she is married has 3 children 6 grandchildren. Tobacco. She does not routinely exercise but are use occasional alcohol.  ROS is negative for fevers, chills or night sweats. There is no significant weight loss. She does admit to fatigue, nonrestorative sleep. She has noticed an irregularity to her heart rhythm. She denies chest pain. There is mild shortness of breath with activity. Her diabetes has not been well-controlled. He denies significant edema. She denies bleeding. She denies paresthesias per she denies significant weight loss. Other system review is negative.  PE BP 130/80  Pulse 93  Ht 5\' 5"  (1.651 m)  Wt 206 lb 8 oz (93.668 kg)  BMI 34.36 kg/m2  General: Alert, oriented, no distress.  Skin: normal turgor, no rashes HEENT: Normocephalic, atraumatic. Pupils round and reactive; sclera anicteric;no lid lag.  Nose without nasal septal hypertrophy Mouth/Parynx  benign; Mallinpatti scale 3 Neck: No JVD, no carotid briuts Lungs: clear to ausculatation and percussion; no wheezing or rales Heart: Irregularly irregular with ventricular rate in the 80s to 90s.  s1 s2 normal 1/6 systolic murmur. Abdomen: soft, nontender; no hepatosplenomehaly, BS+; abdominal aorta nontender and not dilated by palpation. Pulses 2+ Extremities: no clubbing cyanosis or edema, Homan's sign negative  Neurologic: grossly nonfocal  ECG: Atrial fibrillation with ventricular rate at approximately 80-90 beats per minute. There are nonspecific ST-T changes which may be contributed  Lanoxin effect.  LABS:  BMET    Component Value Date/Time   NA 137 03/23/2013 1105   K 4.7 03/23/2013 1105  CL 99 03/23/2013 1105   CO2 26 03/23/2013 1105   GLUCOSE 210* 03/23/2013 1105   BUN 15 03/23/2013 1105   CREATININE 0.76 03/23/2013 1105   CALCIUM 9.9 03/23/2013 1105     Hepatic Function Panel     Component Value Date/Time   PROT 7.4 03/23/2013 1105   ALBUMIN 4.6 03/23/2013 1105   AST 22 03/23/2013 1105   ALT 19 03/23/2013 1105   ALKPHOS 37* 03/23/2013 1105   BILITOT 0.4 03/23/2013 1105     CBC    Component Value Date/Time   WBC 8.6 03/23/2013 1105   RBC 5.14* 03/23/2013 1105   HGB 13.7 03/23/2013 1105   HCT 41.1 03/23/2013 1105   PLT 470* 03/23/2013 1105   MCV 80.0 03/23/2013 1105   MCH 26.7 03/23/2013 1105   MCHC 33.3 03/23/2013 1105   RDW 15.0 03/23/2013 1105     BNP No results found for this basename: probnp    Lipid Panel     Component Value Date/Time   TRIG 486* 03/23/2013 1105   LDLCALC NOT CALC 03/23/2013 1105     RADIOLOGY: No results found.    ASSESSMENT AND PLAN: Amy Wright is an 83 -year-old who has a history of paroxysmal atrial fibrillation and has been on Coumadin anticoagulation therapy.  She remains in atrial fibrillation despite beta blocker, calcium channel blocker, Lanoxin, and most recently low-dose propofenone therapy. Her echo Doppler study confirms  normal systolic function and upper normal left atrial dimensions. She remains in atrial fibrillation. I did discuss plans for outpatient DC cardioversion on therapy. She would like to see if perhaps the prepontine known can be tried a little bit longer before attempting DC cardioversion. Her QTc interval is normal at 397 ms. I will now attempt to further titrate this to 225 mg every 8 hours and will reduce her metoprolol succinate 50 mg twice a day from her present dose of 100 mg in the morning and 50 mg at night. I will see her back in the office in 3 weeks followup evaluation. If she still in atrial fibrillation we will schedule her for outpatient DC cardioversion at that time.   Lennette Bihari, MD, [facility]  05/07/2013 12:15 PM

## 2013-06-02 ENCOUNTER — Ambulatory Visit: Payer: No Typology Code available for payment source | Admitting: Cardiovascular Disease

## 2013-06-07 ENCOUNTER — Telehealth: Payer: Self-pay | Admitting: Cardiovascular Disease

## 2013-06-07 ENCOUNTER — Telehealth: Payer: Self-pay | Admitting: *Deleted

## 2013-06-07 NOTE — Telephone Encounter (Signed)
Pt was calling in regards to a cancelled appointment and being in A-fib. Wants to see Dr. Tresa Endo but he has nothing available. I offered a PA appointment and she refused and said she does not want to see them she wants to see Dr. Tresa Endo this week.

## 2013-06-07 NOTE — Telephone Encounter (Signed)
Spoke with patient in reference to her symptoms of a-fib. She has cancelled previous appointment. Offered her appointment to see mid level. She declined saying that she would wait for Dr. Tresa Endo. Gave her appointment for next Wednesday 10/8. Stressed to her the importance of getting her symptoms under control and that a PA or NP is well capable of taking care of her. She states that if her symptoms increase, then she will call me back to see one of them.

## 2013-06-07 NOTE — Telephone Encounter (Signed)
After previous conversation with the patient, I spoke with Dr. Tresa Endo about her symptoms. He advised me to have the patient to go back to previous dose of metoprolol and to keep up with her pulse rate. Advise [place] if it gets too slow. Patient states that her rate has been in the 80's and 90's. She voiced understanding of her instructions.

## 2013-06-15 ENCOUNTER — Ambulatory Visit (INDEPENDENT_AMBULATORY_CARE_PROVIDER_SITE_OTHER): Payer: Medicare Other | Admitting: Cardiovascular Disease

## 2013-06-15 ENCOUNTER — Encounter: Payer: Self-pay | Admitting: Cardiovascular Disease

## 2013-06-15 ENCOUNTER — Ambulatory Visit (INDEPENDENT_AMBULATORY_CARE_PROVIDER_SITE_OTHER): Payer: Medicare Other | Admitting: Pharmacist Clinician (PhC)/ Clinical Pharmacy Specialist

## 2013-06-15 VITALS — BP 120/60 | HR 75 | Ht 65.5 in | Wt 204.9 lb

## 2013-06-15 DIAGNOSIS — Z79899 Other long term (current) drug therapy: Secondary | ICD-10-CM

## 2013-06-15 DIAGNOSIS — I4891 Unspecified atrial fibrillation: Secondary | ICD-10-CM

## 2013-06-15 DIAGNOSIS — R5381 Other malaise: Secondary | ICD-10-CM

## 2013-06-15 DIAGNOSIS — E782 Mixed hyperlipidemia: Secondary | ICD-10-CM

## 2013-06-15 DIAGNOSIS — Z7901 Long term (current) use of anticoagulants: Secondary | ICD-10-CM

## 2013-06-15 DIAGNOSIS — R0602 Shortness of breath: Secondary | ICD-10-CM

## 2013-06-15 DIAGNOSIS — D689 Coagulation defect, unspecified: Secondary | ICD-10-CM

## 2013-06-15 DIAGNOSIS — I480 Paroxysmal atrial fibrillation: Secondary | ICD-10-CM

## 2013-06-15 DIAGNOSIS — Z5181 Encounter for therapeutic drug level monitoring: Secondary | ICD-10-CM

## 2013-06-15 DIAGNOSIS — I10 Essential (primary) hypertension: Secondary | ICD-10-CM

## 2013-06-15 DIAGNOSIS — E119 Type 2 diabetes mellitus without complications: Secondary | ICD-10-CM

## 2013-06-15 NOTE — Patient Instructions (Signed)
Dr. Tresa Endo has recommended that you have a Cardioversion (DCCV). Electrical Cardioversion uses a jolt of electricity to your heart either through paddles or wired patches attached to your chest. This is a controlled, usually prescheduled, procedure. Defibrillation is done under light anesthesia in the hospital, and you usually go home the day of the procedure. This is done to get your heart back into a normal rhythm. You are not awake for the procedure. Please see the instruction sheet given to you today.  You need to get blood work and a chest x-ray prior to this procedure.

## 2013-06-15 NOTE — Progress Notes (Signed)
Patient ID: Amy Wright, female   DOB: 10/22/1940, 72 y.o.   MRN: [id number]      HPI: Amy Wright, is a 72 y.o. female who presents for followup cardiology evaluation of her recent atrial fibrillation.  Amy Wright has a history of paroxysmal atrial fibrillation and has been on chronic Coumadin therapy. Additional problems include type 2 diabetes mellitus, mixed hyperlipidemia with marked hypertriglyceridemia, as well as aortic valve sclerosis. An echo Doppler study in 2012 showed normal systolic function with mild to moderate mitral regurgitation and mild mitral annular calcification, aortic valve sclerosis without stenosis. She has a history of significant mixed hyperlipidemia and triglycerides have been in excess of 500. Laboratory in December at [facility] showed improvement but still markedly abnormal lipids with a total cholesterol 186 triglycerides 406 HDL 42 VLDL 81 and non-HDL cholesterol 144. When I last saw her she was taken to lipids one 35 mg daily. I increased her Lovaza 2 capsules twice a day and recommended a minimum of 30 pound weight loss. Followup laboratory  On 01/06/2013 at [facility] by Dr. Aguiar showed a total cholesterol 202 triglycerides 503 HDL 42 VLDL 101 and non-HDL cholesterol 160. Direct LDL was 105. Chemistries were normal. In July she was found to be in atrial fibrillation.  When I saw her I increased her Toprol to 150 mg in the morning and 100 mg at night. I was also concerned about the possibility of his obstructive sleep apnea and discussed with her the implications that if she does have sleep apnea the likelihood that this can lead to recurrent atrial fibrillation following cardioversion. I did recommend she undergo an echo Doppler study as well as undergo a complete set of laboratory.   Amy Wright has not scheduled her sleep study. She still notes her heart rate is irregular. She has been taking Coumadin anticoagulation. An echo Doppler study on 03/23/2013   showed an ejection fraction of 55-60% without wall motion abnormalities. She did have mitral annular calcification with mild MR. Her left atrium was upper normal in size. She did not have evidence for pulmonary hypertension. When I saw her several weeks ago further discussion was made concerning attempt at restoration of sinus rhythm. She was started initially on Propofenone 150 mg every 8 hours. When I last saw on 05/05/2013 I discussed cardioversion at that time but she preferred another attempt to see if she would convert to sinus rhythm medically. As result, I further titrated her profenone to 225 mg 3 times a day. I reduced her metoprolol succinate 50 mg twice a day. He has felt improved on this therapy. She presents now for followup evaluation  Past Medical History  Diagnosis Date  . Arrhythmia     Atrial fibrillation  . Hyperlipidemia   . Hypertriglyceridemia   . 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 Central jet. Aortic Valve appears to be mildly sclerotic. Trace aortic regurgitation.EF >55%  . Diabetes mellitus without complication     type 2    Past Surgical History  Procedure Laterality Date  . Cesarean section  1971  . Breast enhancement surgery  1985    Allergies  Allergen Reactions  . Statins     myalgias    Current Outpatient Prescriptions  Medication Sig Dispense Refill  . Ascorbic Acid (VITAMIN C) 1000 MG tablet Take 1,000 mg by mouth. 3 tablets daily      . Canagliflozin (INVOKANA) 300 MG TABS Take 1 tablet by mouth daily.      .   Cholecalciferol (VITAMIN D-3 PO) Take by mouth.      . Choline Fenofibrate (TRILIPIX) 135 MG capsule Take 135 mg by mouth daily.      . digoxin (LANOXIN) 0.25 MG tablet Take 0.25 mg by mouth daily. Takes 1/2 tablet      . diltiazem (CARDIZEM CD) 240 MG 24 hr capsule Take 240 mg by mouth daily.       . ezetimibe (ZETIA) 10 MG tablet Take 10 mg by mouth daily.      . Fenofibrate 120 MG TABS  Take by mouth.      . folic acid (FOLVITE) 1 MG tablet Take 1 mg by mouth daily.      . glipiZIDE (GLUCOTROL) 10 MG tablet Take 10 mg by mouth daily.      . LORazepam (ATIVAN) 0.5 MG tablet Take 1 tablet (0.5 mg total) by mouth as needed.  30 tablet  0  . losartan-hydrochlorothiazide (HYZAAR) 100-25 MG per tablet Take 1 tablet by mouth daily.      . metFORMIN (GLUCOPHAGE) 1000 MG tablet Take 1,000 mg by mouth 2 (two) times daily with a meal.      . metoprolol succinate (TOPROL-XL) 100 MG 24 hr tablet Take 1 pill in the morning and 1/2 pill at night.  270 tablet  3  . omega-3 acid ethyl esters (LOVAZA) 1 G capsule Take 1 capsule (1 g total) by mouth 2 (two) times daily. Take 2 capsules  240 capsule  3  . propafenone (RYTHMOL) 225 MG tablet Take 1 tablet (225 mg total) by mouth every 8 (eight) hours.  90 tablet  6  . sertraline (ZOLOFT) 100 MG tablet Take 100 mg by mouth daily.      . vitamin B-12 (CYANOCOBALAMIN) 100 MCG tablet Take 50 mcg by mouth daily.      . warfarin (COUMADIN) 2.5 MG tablet Take 2.5 mg by mouth daily. Alt with 5 mg.      . zolpidem (AMBIEN CR) 12.5 MG CR tablet Take 12.5 mg by mouth at bedtime as needed for sleep.       No current facility-administered medications for this visit.    Socially she is married has 3 children 6 grandchildren. No tobacco use. She does not routinely exercise but are use occasional alcohol.  ROS is negative for fevers, chills or night sweats. There is no significant weight loss. She does admit to fatigue, nonrestorative sleep. She has noticed an irregularity to her heart rhythm but her rate has been controlled.. She denies chest pain. There is mild shortness of breath with activity. Her diabetes has not been well-controlled. She denies significant edema. She denies bleeding. She denies paresthesias.  She denies bleeding. She has been on chronic Coumadin therapy for approximately 8 years. Other 12 point system review is negative.  PE BP 120/60   Pulse 75  Ht 5' 5.5" (1.664 m)  Wt 204 lb 14.4 oz (92.942 kg)  BMI 33.57 kg/m2  General: Alert, oriented, no distress.  Skin: normal turgor, no rashes HEENT: Normocephalic, atraumatic. Pupils round and reactive; sclera anicteric;no lid lag.  Nose without nasal septal hypertrophy Mouth/Parynx benign; Mallinpatti scale 3 Neck: No JVD, no carotid briuts Lungs: clear to ausculatation and percussion; no wheezing or rales Heart: Irregularly irregular with ventricular rate in the 80s to 90s.  s1 s2 normal 1/6 systolic murmur. Abdomen: soft, nontender; no hepatosplenomehaly, BS+; abdominal aorta nontender and not dilated by palpation. Pulses 2+ Extremities: no clubbing cyanosis or edema, Homan's sign negative    Neurologic: grossly nonfocal  ECG: Atrial fibrillation with ventricular rate a that is now more controlled in the 70s. There are nonspecific ST-T changes which may be contributed  Lanoxin effect.  An INR was obtained in the office today and this is 2.7 demonstrating therapeutic anticoagulation with warfarin.  LABS:  BMET    Component Value Date/Time   NA 137 03/23/2013 1105   K 4.7 03/23/2013 1105   CL 99 03/23/2013 1105   CO2 26 03/23/2013 1105   GLUCOSE 210* 03/23/2013 1105   BUN 15 03/23/2013 1105   CREATININE 0.76 03/23/2013 1105   CALCIUM 9.9 03/23/2013 1105     Hepatic Function Panel     Component Value Date/Time   PROT 7.4 03/23/2013 1105   ALBUMIN 4.6 03/23/2013 1105   AST 22 03/23/2013 1105   ALT 19 03/23/2013 1105   ALKPHOS 37* 03/23/2013 1105   BILITOT 0.4 03/23/2013 1105     CBC    Component Value Date/Time   WBC 8.6 03/23/2013 1105   RBC 5.14* 03/23/2013 1105   HGB 13.7 03/23/2013 1105   HCT 41.1 03/23/2013 1105   PLT 470* 03/23/2013 1105   MCV 80.0 03/23/2013 1105   MCH 26.7 03/23/2013 1105   MCHC 33.3 03/23/2013 1105   RDW 15.0 03/23/2013 1105     BNP No results found for this basename: probnp    Lipid Panel     Component Value Date/Time   TRIG 486*  03/23/2013 1105   LDLCALC NOT CALC 03/23/2013 1105     RADIOLOGY: No results found.    ASSESSMENT AND PLAN: Ms. Reali is an 72 -year-old who has a history of paroxysmal atrial fibrillation and has been on Coumadin anticoagulation therapy.  She remains in atrial fibrillation despite beta blocker, calcium channel blocker, Lanoxin, and most recently  propofenone therapy. Her ventricular rate is now improved in the 70s and she has tolerated the titration of her profenone to 225 mg every 8 hours. Her echo Doppler study confirms normal systolic function and upper normal left atrial dimensions. Her INR today is therapeutic at 2.7. I discussed with her the DC cardioversion procedure including risks/benefits of the procedure as well as anesthesia. We will schedule this to be done at [facility] next Thursday on 06/23/2013. Prior to that, she will have laboratory as well as a chest x-ray preoperatively.  Thomas A. Kelly, MD, FACC  06/15/2013 11:41 AM    

## 2013-06-20 ENCOUNTER — Ambulatory Visit
Admission: RE | Admit: 2013-06-20 | Discharge: 2013-06-20 | Disposition: A | Discharge status: Home / Self Care | Payer: Medicare Other | Source: Ambulatory Visit | Attending: Cardiovascular Disease | Admitting: Cardiovascular Disease

## 2013-06-20 ENCOUNTER — Other Ambulatory Visit: Payer: Self-pay | Admitting: *Deleted

## 2013-06-20 DIAGNOSIS — I4891 Unspecified atrial fibrillation: Secondary | ICD-10-CM

## 2013-06-20 DIAGNOSIS — R0602 Shortness of breath: Secondary | ICD-10-CM

## 2013-06-20 DIAGNOSIS — Z01818 Encounter for other preprocedural examination: Secondary | ICD-10-CM

## 2013-06-20 LAB — CBC
HCT: 40.7 % (ref 36.0–46.0)
MCHC: 33.2 g/dL (ref 30.0–36.0)
MCV: 78.6 fL (ref 78.0–100.0)
RBC: 5.18 MIL/uL — ABNORMAL HIGH (ref 3.87–5.11)
RDW: 16.1 % — ABNORMAL HIGH (ref 11.5–15.5)
WBC: 7.9 10*3/uL (ref 4.0–10.5)

## 2013-06-21 LAB — COMPREHENSIVE METABOLIC PANEL
ALT: 17 U/L (ref 0–35)
AST: 18 U/L (ref 0–37)
Albumin: 4.4 g/dL (ref 3.5–5.2)
Alkaline Phosphatase: 33 U/L — ABNORMAL LOW (ref 39–117)
BUN: 20 mg/dL (ref 6–23)
Glucose, Bld: 182 mg/dL — ABNORMAL HIGH (ref 70–99)
Potassium: 4.8 mEq/L (ref 3.5–5.3)
Total Bilirubin: 0.5 mg/dL (ref 0.3–1.2)

## 2013-06-21 LAB — PROTIME-INR
INR: 2.23 — ABNORMAL HIGH (ref <1.50)
Prothrombin Time: 23.9 seconds — ABNORMAL HIGH (ref 11.6–15.2)

## 2013-06-21 NOTE — Progress Notes (Signed)
Quick Note:  This is a pre procedure CXR and will be discussed at that time. ______

## 2013-06-22 ENCOUNTER — Telehealth: Payer: Self-pay | Admitting: Cardiovascular Disease

## 2013-06-22 MED ORDER — SODIUM CHLORIDE 0.9 % IV SOLN
INTRAVENOUS | Status: DC
  Administered 2013-06-23: 500 mL via INTRAVENOUS

## 2013-06-22 NOTE — Telephone Encounter (Signed)
Wants to know if lab work and chest xrays came out all right from yesterday? Wants to know if she still on for her Cardioversion tomorrow? Please call.

## 2013-06-22 NOTE — Telephone Encounter (Signed)
Burna Mortimer notified and stated tests were pre-procedure protocol and pt is still scheduled for cardioversion.    Returned call and pt informed.  Pt verbalized understanding and agreed w/ plan.  Pt would like Burna Mortimer to call her back.  Message forwarded to W. Waddell, CMA.

## 2013-06-23 ENCOUNTER — Encounter (HOSPITAL_COMMUNITY): Payer: Self-pay

## 2013-06-23 ENCOUNTER — Encounter (HOSPITAL_COMMUNITY): Payer: Medicare Other | Admitting: Anesthesiology

## 2013-06-23 ENCOUNTER — Encounter (HOSPITAL_COMMUNITY)
Admission: RE | Disposition: A | Discharge status: Home / Self Care | Payer: Self-pay | Source: Ambulatory Visit | Attending: Cardiovascular Disease

## 2013-06-23 ENCOUNTER — Ambulatory Visit (HOSPITAL_COMMUNITY)
Admission: RE | Admit: 2013-06-23 | Discharge: 2013-06-23 | Disposition: A | Discharge status: Home / Self Care | Payer: Medicare Other | Source: Ambulatory Visit | Attending: Cardiovascular Disease | Admitting: Cardiovascular Disease

## 2013-06-23 ENCOUNTER — Ambulatory Visit (HOSPITAL_COMMUNITY): Payer: Medicare Other | Admitting: Anesthesiology

## 2013-06-23 DIAGNOSIS — R9431 Abnormal electrocardiogram [ECG] [EKG]: Secondary | ICD-10-CM | POA: Insufficient documentation

## 2013-06-23 DIAGNOSIS — Z5181 Encounter for therapeutic drug level monitoring: Secondary | ICD-10-CM

## 2013-06-23 DIAGNOSIS — I4891 Unspecified atrial fibrillation: Secondary | ICD-10-CM | POA: Insufficient documentation

## 2013-06-23 DIAGNOSIS — E119 Type 2 diabetes mellitus without complications: Secondary | ICD-10-CM | POA: Insufficient documentation

## 2013-06-23 DIAGNOSIS — I080 Rheumatic disorders of both mitral and aortic valves: Secondary | ICD-10-CM | POA: Insufficient documentation

## 2013-06-23 DIAGNOSIS — Z01818 Encounter for other preprocedural examination: Secondary | ICD-10-CM

## 2013-06-23 DIAGNOSIS — I480 Paroxysmal atrial fibrillation: Secondary | ICD-10-CM

## 2013-06-23 DIAGNOSIS — Z7901 Long term (current) use of anticoagulants: Secondary | ICD-10-CM

## 2013-06-23 DIAGNOSIS — I10 Essential (primary) hypertension: Secondary | ICD-10-CM | POA: Insufficient documentation

## 2013-06-23 DIAGNOSIS — E782 Mixed hyperlipidemia: Secondary | ICD-10-CM | POA: Insufficient documentation

## 2013-06-23 HISTORY — PX: CARDIOVERSION: SHX1299

## 2013-06-23 LAB — GLUCOSE, CAPILLARY: Glucose-Capillary: 178 mg/dL — ABNORMAL HIGH (ref 70–99)

## 2013-06-23 SURGERY — CARDIOVERSION
Anesthesia: Monitor Anesthesia Care | Wound class: Clean

## 2013-06-23 MED ORDER — PROPOFOL 10 MG/ML IV BOLUS
INTRAVENOUS | Status: DC | PRN
  Administered 2013-06-23: 80 mg via INTRAVENOUS

## 2013-06-23 MED ORDER — LIDOCAINE HCL (CARDIAC) 20 MG/ML IV SOLN
INTRAVENOUS | Status: DC | PRN
  Administered 2013-06-23: 100 mg via INTRAVENOUS

## 2013-06-23 NOTE — Anesthesia Postprocedure Evaluation (Signed)
  Anesthesia Post-op Note  Patient: Amy Wright  Procedure(s) Performed: Procedure(s): CARDIOVERSION (N/A)  Patient Location: Endoscopy Unit  Anesthesia Type:MAC  Level of Consciousness: awake, alert  and oriented  Airway and Oxygen Therapy: Patient Spontanous Breathing and Patient connected to nasal cannula oxygen  Post-op Pain: none  Post-op Assessment: Post-op Vital signs reviewed and Patient's Cardiovascular Status Stable  Post-op Vital Signs: Reviewed and stable  Complications: No apparent anesthesia complications

## 2013-06-23 NOTE — Preoperative (Signed)
Beta Blockers   Reason not to administer Beta Blockers:Not Applicable 

## 2013-06-23 NOTE — Transfer of Care (Signed)
Immediate Anesthesia Transfer of Care Note  Patient: Amy Wright  Procedure(s) Performed: Procedure(s): CARDIOVERSION (N/A)  Patient Location: Endoscopy Unit  Anesthesia Type:MAC  Level of Consciousness: awake  Airway & Oxygen Therapy: Patient Spontanous Breathing and Patient connected to nasal cannula oxygen  Post-op Assessment: Report given to PACU RN and Post -op Vital signs reviewed and stable  Post vital signs: Reviewed and stable  Complications: No apparent anesthesia complications

## 2013-06-23 NOTE — Telephone Encounter (Signed)
Spoke with patient answered all of her questions in reference to her cardioversion to her satisfaction.

## 2013-06-23 NOTE — Anesthesia Preprocedure Evaluation (Addendum)
Anesthesia Evaluation  Patient identified by MRN, date of birth, ID band Patient awake    Reviewed: Allergy & Precautions, H&P , NPO status , Patient's Chart, lab work & pertinent test results, reviewed documented beta blocker date and time   Airway Mallampati: II TM Distance: >3 FB Neck ROM: Full    Dental  (+) Teeth Intact   Pulmonary    Pulmonary exam normal       Cardiovascular hypertension, Pt. on medications and Pt. on home beta blockers + dysrhythmias Atrial Fibrillation Rhythm:Irregular     Neuro/Psych    GI/Hepatic   Endo/Other  diabetes, Well Controlled, Type 2  Renal/GU      Musculoskeletal   Abdominal   Peds  Hematology   Anesthesia Other Findings   Reproductive/Obstetrics                          Anesthesia Physical Anesthesia Plan  ASA: III  Anesthesia Plan: MAC   Post-op Pain Management:    Induction: Intravenous  Airway Management Planned: Mask  Additional Equipment:   Intra-op Plan:   Post-operative Plan:   Informed Consent:   Dental advisory given  Plan Discussed with: CRNA, Anesthesiologist and Surgeon  Anesthesia Plan Comments:         Anesthesia Quick Evaluation

## 2013-06-23 NOTE — Interval H&P Note (Signed)
History and Physical Interval Note:  06/23/2013 1:22 PM  Amy Wright  has presented today for surgery, with the diagnosis of afib  The various methods of treatment have been discussed with the patient and family. After consideration of risks, benefits and other options for treatment, the patient has consented to  Procedure(s): CARDIOVERSION (N/A) as a surgical intervention .  The patient's history has been reviewed, patient examined, no change in status, stable for surgery.  I have reviewed the patient's chart and labs.  Questions were answered to the patient's satisfaction.     [name]

## 2013-06-23 NOTE — H&P (View-Only) (Signed)
Patient ID: Amy Wright, female   DOB: 05/02/1941, 72 y.o.   MRN: [id number]      HPI: Amy Wright, is a 72 y.o. female who presents for followup cardiology evaluation of her recent atrial fibrillation.  Amy Wright has a history of paroxysmal atrial fibrillation and has been on chronic Coumadin therapy. Additional problems include type 2 diabetes mellitus, mixed hyperlipidemia with marked hypertriglyceridemia, as well as aortic valve sclerosis. An echo Doppler study in 2012 showed normal systolic function with mild to moderate mitral regurgitation and mild mitral annular calcification, aortic valve sclerosis without stenosis. She has a history of significant mixed hyperlipidemia and triglycerides have been in excess of 500. Laboratory in December at [facility] showed improvement but still markedly abnormal lipids with a total cholesterol 186 triglycerides 406 HDL 42 VLDL 81 and non-HDL cholesterol 144. When I last saw her she was taken to lipids one 35 mg daily. I increased her Lovaza 2 capsules twice a day and recommended a minimum of 30 pound weight loss. Followup laboratory  On 01/06/2013 at [facility] by Amy Wright showed a total cholesterol 202 triglycerides 503 HDL 42 VLDL 101 and non-HDL cholesterol 160. Direct LDL was 105. Chemistries were normal. In July she was found to be in atrial fibrillation.  When I saw her I increased her Toprol to 150 mg in the morning and 100 mg at night. I was also concerned about the possibility of his obstructive sleep apnea and discussed with her the implications that if she does have sleep apnea the likelihood that this can lead to recurrent atrial fibrillation following cardioversion. I did recommend she undergo an echo Doppler study as well as undergo a complete set of laboratory.   Amy Wright has not scheduled her sleep study. She still notes her heart rate is irregular. She has been taking Coumadin anticoagulation. An echo Doppler study on 03/23/2013   showed an ejection fraction of 55-60% without wall motion abnormalities. She did have mitral annular calcification with mild MR. Her left atrium was upper normal in size. She did not have evidence for pulmonary hypertension. When I saw her several weeks ago further discussion was made concerning attempt at restoration of sinus rhythm. She was started initially on Amy Wright 150 mg every 8 hours. When I last saw on 05/05/2013 I discussed cardioversion at that time but she preferred another attempt to see if she would convert to sinus rhythm medically. As result, I further titrated her profenone to 225 mg 3 times a day. I reduced her metoprolol succinate 50 mg twice a day. He has felt improved on this therapy. She presents now for followup evaluation  Past Medical History  Diagnosis Date  . Arrhythmia     Atrial fibrillation  . Hyperlipidemia   . Hypertriglyceridemia   . 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 Central jet. Aortic Valve appears to be mildly sclerotic. Trace aortic regurgitation.EF >55%  . Diabetes mellitus without complication     type 2    Past Surgical History  Procedure Laterality Date  . Cesarean section  1971  . Breast enhancement surgery  1985    Allergies  Allergen Reactions  . Statins     myalgias    Current Outpatient Prescriptions  Medication Sig Dispense Refill  . Ascorbic Acid (VITAMIN C) 1000 MG tablet Take 1,000 mg by mouth. 3 tablets daily      . Canagliflozin (INVOKANA) 300 MG TABS Take 1 tablet by mouth daily.      Marland Kitchen  Cholecalciferol (VITAMIN D-3 PO) Take by mouth.      . Choline Fenofibrate (TRILIPIX) 135 MG capsule Take 135 mg by mouth daily.      . digoxin (LANOXIN) 0.25 MG tablet Take 0.25 mg by mouth daily. Takes 1/2 tablet      . diltiazem (CARDIZEM CD) 240 MG 24 hr capsule Take 240 mg by mouth daily.       Marland Kitchen ezetimibe (ZETIA) 10 MG tablet Take 10 mg by mouth daily.      . Fenofibrate 120 MG TABS  Take by mouth.      . folic acid (FOLVITE) 1 MG tablet Take 1 mg by mouth daily.      Marland Kitchen glipiZIDE (GLUCOTROL) 10 MG tablet Take 10 mg by mouth daily.      Marland Kitchen LORazepam (ATIVAN) 0.5 MG tablet Take 1 tablet (0.5 mg total) by mouth as needed.  30 tablet  0  . losartan-hydrochlorothiazide (HYZAAR) 100-25 MG per tablet Take 1 tablet by mouth daily.      . metFORMIN (GLUCOPHAGE) 1000 MG tablet Take 1,000 mg by mouth 2 (two) times daily with a meal.      . metoprolol succinate (TOPROL-XL) 100 MG 24 hr tablet Take 1 pill in the morning and 1/2 pill at night.  270 tablet  3  . omega-3 acid ethyl esters (LOVAZA) 1 G capsule Take 1 capsule (1 g total) by mouth 2 (two) times daily. Take 2 capsules  240 capsule  3  . propafenone (RYTHMOL) 225 MG tablet Take 1 tablet (225 mg total) by mouth every 8 (eight) hours.  90 tablet  6  . sertraline (ZOLOFT) 100 MG tablet Take 100 mg by mouth daily.      . vitamin B-12 (CYANOCOBALAMIN) 100 MCG tablet Take 50 mcg by mouth daily.      Marland Kitchen warfarin (COUMADIN) 2.5 MG tablet Take 2.5 mg by mouth daily. Alt with 5 mg.      . zolpidem (AMBIEN CR) 12.5 MG CR tablet Take 12.5 mg by mouth at bedtime as needed for sleep.       No current facility-administered medications for this visit.    Socially she is married has 3 children 6 grandchildren. No tobacco use. She does not routinely exercise but are use occasional alcohol.  ROS is negative for fevers, chills or night sweats. There is no significant weight loss. She does admit to fatigue, nonrestorative sleep. She has noticed an irregularity to her heart rhythm but her rate has been controlled.. She denies chest pain. There is mild shortness of breath with activity. Her diabetes has not been well-controlled. She denies significant edema. She denies bleeding. She denies paresthesias.  She denies bleeding. She has been on chronic Coumadin therapy for approximately 8 years. Other 12 point system review is negative.  PE BP 120/60   Pulse 75  Ht 5' 5.5" (1.664 m)  Wt 204 lb 14.4 oz (92.942 kg)  BMI 33.57 kg/m2  General: Alert, oriented, no distress.  Skin: normal turgor, no rashes HEENT: Normocephalic, atraumatic. Pupils round and reactive; sclera anicteric;no lid lag.  Nose without nasal septal hypertrophy Mouth/Parynx benign; Mallinpatti scale 3 Neck: No JVD, no carotid briuts Lungs: clear to ausculatation and percussion; no wheezing or rales Heart: Irregularly irregular with ventricular rate in the 80s to 90s.  s1 s2 normal 1/6 systolic murmur. Abdomen: soft, nontender; no hepatosplenomehaly, BS+; abdominal aorta nontender and not dilated by palpation. Pulses 2+ Extremities: no clubbing cyanosis or edema, Homan's sign negative  Neurologic: grossly nonfocal  ECG: Atrial fibrillation with ventricular rate a that is now more controlled in the 70s. There are nonspecific ST-T changes which may be contributed  Lanoxin effect.  An INR was obtained in the office today and this is 2.7 demonstrating therapeutic anticoagulation with warfarin.  LABS:  BMET    Component Value Date/Time   NA 137 03/23/2013 1105   K 4.7 03/23/2013 1105   CL 99 03/23/2013 1105   CO2 26 03/23/2013 1105   GLUCOSE 210* 03/23/2013 1105   BUN 15 03/23/2013 1105   CREATININE 0.76 03/23/2013 1105   CALCIUM 9.9 03/23/2013 1105     Hepatic Function Panel     Component Value Date/Time   PROT 7.4 03/23/2013 1105   ALBUMIN 4.6 03/23/2013 1105   AST 22 03/23/2013 1105   ALT 19 03/23/2013 1105   ALKPHOS 37* 03/23/2013 1105   BILITOT 0.4 03/23/2013 1105     CBC    Component Value Date/Time   WBC 8.6 03/23/2013 1105   RBC 5.14* 03/23/2013 1105   HGB 13.7 03/23/2013 1105   HCT 41.1 03/23/2013 1105   PLT 470* 03/23/2013 1105   MCV 80.0 03/23/2013 1105   MCH 26.7 03/23/2013 1105   MCHC 33.3 03/23/2013 1105   RDW 15.0 03/23/2013 1105     BNP No results found for this basename: probnp    Lipid Panel     Component Value Date/Time   TRIG 486*  03/23/2013 1105   LDLCALC NOT CALC 03/23/2013 1105     RADIOLOGY: No results found.    ASSESSMENT AND PLAN: Ms. Goates is an 56 -year-old who has a history of paroxysmal atrial fibrillation and has been on Coumadin anticoagulation therapy.  She remains in atrial fibrillation despite beta blocker, calcium channel blocker, Lanoxin, and most recently  Amy Wright therapy. Her ventricular rate is now improved in the 70s and she has tolerated the titration of her profenone to 225 mg every 8 hours. Her echo Doppler study confirms normal systolic function and upper normal left atrial dimensions. Her INR today is therapeutic at 2.7. I discussed with her the DC cardioversion procedure including risks/benefits of the procedure as well as anesthesia. We will schedule this to be done at [facility] next Thursday on 06/23/2013. Prior to that, she will have laboratory as well as a chest x-ray preoperatively.  Lennette Bihari, MD, [facility]  06/15/2013 11:41 AM

## 2013-06-23 NOTE — CV Procedure (Signed)
  CARDIOVERSION NOTE   Procedure: Electrical Cardioversion Indications:  Atrial Fibrillation  Procedure Details:  Consent: Risks of procedure as well as the alternatives and risks of each were explained to the (patient/caregiver).  Consent for procedure obtained.  Time Out: Verified patient identification, verified procedure, site/side was marked, verified correct patient position, special equipment/implants available, medications/allergies/relevent history reviewed, required imaging and test results available.  Performed  Patient placed on cardiac monitor, pulse oximetry, supplemental oxygen as necessary.  Sedation given: propofol 80 mg and lidocaine 100 mg IV Pacer pads placed anterior and posterior chest.  Cardioverted 1 time(s).  Cardioverted at 120J.  Evaluation: Findings: Post procedure EKG shows: NSR Complications: None Patient did tolerate procedure well.   Patient had markedly prolonged sinus node recovery time, with ultimate restoration of sinus rhythm; HR currently 62.  ECG ordered.     Lennette Bihari, MD, [facility] 06/23/2013 1:48 PM

## 2013-06-24 ENCOUNTER — Encounter (HOSPITAL_COMMUNITY): Payer: Self-pay | Admitting: Cardiovascular Disease

## 2013-06-25 ENCOUNTER — Telehealth: Payer: Self-pay | Admitting: Cardiology

## 2013-06-25 NOTE — Telephone Encounter (Signed)
Pt called concerned her HR was too slow and she was weak. HR 48. I told her to decrease Her Toprol to 50 mg QHS and stop her Lanoxin. She was instructed to continue Rythmol. She will call Monday and let us know how she is doing, I would probably stop Diltiazem next if she still has bradycardia.  Corine Shelter PA-C 06/25/2013 3:51 PM

## 2013-06-27 ENCOUNTER — Telehealth: Payer: Self-pay | Admitting: Cardiovascular Disease

## 2013-06-27 ENCOUNTER — Other Ambulatory Visit: Payer: Self-pay | Admitting: *Deleted

## 2013-06-27 MED ORDER — METOPROLOL SUCCINATE ER 50 MG PO TB24: ORAL_TABLET | ORAL | Status: DC

## 2013-06-27 NOTE — Telephone Encounter (Signed)
Spoke with patient she informs me that the feels that she is in a-fib again. Her symptoms started over the weekend. She spoke with Franky Macho and he made some medication changes due to bradycardia. Now she states that her heartrate has gone back up "the other way." approximately 100 bpm. Informed her per Dr. Tresa Endo to increase the metoprolol to 75 mg daily. Schedule for sleep study patient informs me that she is already scheduled for this Thursday.

## 2013-06-27 NOTE — Telephone Encounter (Signed)
Had cardioversion on Thursday  Fine until Friday evening  Talked to [facility] over weekend  He took her off some of meds and she is still having problems today  Heart rate 100 about 10 mins ago  Please call

## 2013-06-27 NOTE — Telephone Encounter (Signed)
Returned call.  Pt stated she thinks she is back in Afib.  Stated she talked to [place] over the weekend and he made changes to her medicines (see phone note).  Pt stated she doesn't think the Rhythmol is working and she wants to hear from Dr. Tresa Endo if he wants her to come in to be seen.  Pt informed she will need an appt for evaluation and EKG to determine what rhythm she is in.  RN offered appt and pt declined.  Stated she wanted Burna Mortimer (CMA for Dr. Tresa Endo) to call her back.  Pt informed Burna Mortimer will be notified, but she and Dr. Tresa Endo are seeing patients right now.  Pt stated she wants to hear from [place] and again declined appt.  Pt informed Burna Mortimer will be notified and understands the delay in treatment by waiting.  Message forwarded to W. Waddell, CMA.

## 2013-06-29 ENCOUNTER — Telehealth: Payer: Self-pay | Admitting: Cardiovascular Disease

## 2013-06-29 NOTE — Telephone Encounter (Signed)
Message forwarded to Dr. Kelly/Wanda, CMA.  

## 2013-06-29 NOTE — Telephone Encounter (Signed)
Please let Burna Mortimer know she is back in atrial Fib.

## 2013-06-29 NOTE — Telephone Encounter (Signed)
Informed patient that since she is still having problems with her a-fib, then she will need appointment tomorrow for evaluation. Scheduled her to see Corine Shelter.

## 2013-06-30 ENCOUNTER — Encounter: Payer: Self-pay | Admitting: Cardiovascular Disease

## 2013-06-30 ENCOUNTER — Ambulatory Visit (INDEPENDENT_AMBULATORY_CARE_PROVIDER_SITE_OTHER): Payer: Medicare Other | Admitting: Cardiovascular Disease

## 2013-06-30 ENCOUNTER — Encounter: Payer: Self-pay | Admitting: Cardiology

## 2013-06-30 ENCOUNTER — Ambulatory Visit (INDEPENDENT_AMBULATORY_CARE_PROVIDER_SITE_OTHER): Payer: Medicare Other | Admitting: Cardiology

## 2013-06-30 VITALS — BP 138/88 | HR 81 | Ht 65.0 in | Wt 204.0 lb

## 2013-06-30 VITALS — BP 130/80 | HR 82 | Ht 65.5 in | Wt 204.0 lb

## 2013-06-30 DIAGNOSIS — Z7901 Long term (current) use of anticoagulants: Secondary | ICD-10-CM

## 2013-06-30 DIAGNOSIS — I4891 Unspecified atrial fibrillation: Secondary | ICD-10-CM

## 2013-06-30 DIAGNOSIS — E119 Type 2 diabetes mellitus without complications: Secondary | ICD-10-CM

## 2013-06-30 DIAGNOSIS — I480 Paroxysmal atrial fibrillation: Secondary | ICD-10-CM

## 2013-06-30 DIAGNOSIS — I10 Essential (primary) hypertension: Secondary | ICD-10-CM

## 2013-06-30 MED ORDER — METOPROLOL SUCCINATE ER 50 MG PO TB24: 50.0000 mg | ORAL_TABLET | Freq: Two times a day (BID) | ORAL | Status: DC

## 2013-06-30 NOTE — Patient Instructions (Signed)
You have been referred to Dr. Hillis Range. Your physician has recommended you make the following change in your medication: increase the metoprolol to 50 mg twice daily.  Your physician has recommended that you have a sleep study. This test records several body functions during sleep, including: brain activity, eye movement, oxygen and carbon dioxide blood levels, heart rate and rhythm, breathing rate and rhythm, the flow of air through your mouth and nose, snoring, body muscle movements, and chest and belly movement. Keep the appointment scheduled for tonight.

## 2013-06-30 NOTE — Assessment & Plan Note (Signed)
SSS component and recurrent PAF after recent DCCV on Rythmol

## 2013-06-30 NOTE — Progress Notes (Signed)
Patient ID: Amy Wright, female   DOB: [birth year], 72 y.o.   MRN: [id number]       HPI: Amy Wright is a 72 y.o. female who is seen as an add-on today with recurrent atrial fibrillation following her recent outpatient DC cardioversion.  Ms. Verley has a history of paroxysmal atrial fibrillation and has been on chronic Coumadin therapy.   An echo Doppler study in 2012 showed normal systolic function with mild to moderate mitral regurgitation and mild mitral annular calcification, aortic valve sclerosis without stenosis.   In July she was found to be in atrial fibrillation.  At that time I increased her Toprol to 150 mg in the morning and 100 mg at night. I was also concerned about the possibility of his obstructive sleep apnea and discussed with her the implications that if she does have sleep apnea the likelihood that this can lead to recurrent atrial fibrillation following cardioversion. I did recommend she undergo an echo Doppler study as well as undergo a complete set of laboratory.   Ms. Richardson deferred scheduling her sleep study.   An echo Doppler study on 03/23/2013  showed an ejection fraction of 55-60% without wall motion abnormalities. She did have mitral annular calcification with mild MR. Her left atrium was upper normal in size. She did not have evidence for pulmonary hypertension. In August  further discussion was made concerning attempt at restoration of sinus rhythm. She was started initially on Propofenone 150 mg every 8 hours. When I saw on 05/05/2013 I discussed cardioversion at that time but she preferred another attempt to see if she would convert to sinus rhythm medically. As result, I further titrated her profenone to 225 mg 3 times a day. I reduced her metoprolol succinate 50 mg twice a day. He has felt improved on this therapy.   Last saw her on 10/8 2014 at which time she was still in atrial fibrillation and plans were made to schedule her for outpatient DC  cardioversion. She presented for outpatient cardioversion on 06/23/2013. Following one shock at 120 J of synchronized cardioversion she successfully converted to sinus rhythm. However, she had a markedly prolonged sinus node recovery time with a pause of greater than 5 seconds prior to her first sinus beat. She ultimately was discharged from the outpatient suite with a heart rate in the 60s. Her digoxin was discontinued. Apparently, the following day she called the office and was noted to have a slow heart rate she was advised to reduce her beta blocker therapy. She certainly call the office and her pulse was fast and apparently she was back in atrial fibrillation. She presents now for followup evaluation. Of note, she is scheduled for a diagnostic polysomnogram tonight to assess for atrial fibrillation which he had initially deferred.  Additional problems include type 2 diabetes mellitus, as well as mixed hyperlipidemia with a history of previously marked hypertriglyceridemia. She has a history of significant mixed hyperlipidemia and triglycerides have been in excess of 500. Laboratory in December at [facility] showed improvement but still markedly abnormal lipids with a total cholesterol 186 triglycerides 406 HDL 42 VLDL 81 and non-HDL cholesterol 144. When I last saw her she was taken to lipids one 35 mg daily. I increased her Lovaza 2 capsules twice a day and recommended a minimum of 30 pound weight loss. Followup laboratory  On 01/06/2013 at [facility] by Dr. Riley Nearing showed a total cholesterol 202 triglycerides 503 HDL 42 VLDL 101 and non-HDL cholesterol 160. Direct LDL was 105.  Chemistries were normal.  Past Medical History  Diagnosis Date  . Arrhythmia     Atrial fibrillation  . Hyperlipidemia   . Hypertriglyceridemia   . 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 Central jet. Aortic Valve appears to be mildly sclerotic. Trace aortic regurgitation.EF  >55%  . Diabetes mellitus without complication     type 2    Past Surgical History  Procedure Laterality Date  . Cesarean section  1971  . Breast enhancement surgery  1985  . Cardioversion N/A 06/23/2013    Procedure: CARDIOVERSION;  Surgeon: Lennette Bihari, MD;  Location: [facility] ENDOSCOPY;  Service: Cardiovascular;  Laterality: N/A;    Allergies  Allergen Reactions  . Statins     myalgias    Current Outpatient Prescriptions  Medication Sig Dispense Refill  . Ascorbic Acid (VITAMIN C) 1000 MG tablet Take 1,000 mg by mouth. 3 tablets daily      . Canagliflozin (INVOKANA) 300 MG TABS Take 1 tablet by mouth daily.      . Cholecalciferol (VITAMIN D-3 PO) Take by mouth.      . Choline Fenofibrate (TRILIPIX) 135 MG capsule Take 135 mg by mouth daily.      Marland Kitchen diltiazem (CARDIZEM CD) 240 MG 24 hr capsule Take 240 mg by mouth daily.       Marland Kitchen ezetimibe (ZETIA) 10 MG tablet Take 10 mg by mouth daily.      . Fenofibrate 120 MG TABS Take by mouth.      . folic acid (FOLVITE) 1 MG tablet Take 1 mg by mouth daily.      Marland Kitchen glipiZIDE (GLUCOTROL) 10 MG tablet Take 10 mg by mouth daily.      Marland Kitchen LORazepam (ATIVAN) 0.5 MG tablet Take 1 tablet (0.5 mg total) by mouth as needed.  30 tablet  0  . losartan-hydrochlorothiazide (HYZAAR) 100-25 MG per tablet Take 1 tablet by mouth daily.      . metFORMIN (GLUCOPHAGE) 1000 MG tablet Take 1,000 mg by mouth 2 (two) times daily with a meal.      . metoprolol succinate (TOPROL-XL) 50 MG 24 hr tablet Take 1 tablet (50 mg total) by mouth 2 (two) times daily.  60 tablet  11  . omega-3 acid ethyl esters (LOVAZA) 1 G capsule Take 1 capsule (1 g total) by mouth 2 (two) times daily. Take 2 capsules  240 capsule  3  . propafenone (RYTHMOL) 225 MG tablet Take 1 tablet (225 mg total) by mouth every 8 (eight) hours.  90 tablet  6  . sertraline (ZOLOFT) 100 MG tablet Take 100 mg by mouth daily.      . vitamin B-12 (CYANOCOBALAMIN) 100 MCG tablet Take 50 mcg by mouth daily.      Marland Kitchen  warfarin (COUMADIN) 2.5 MG tablet Take 2.5 mg by mouth daily. Alt with 5 mg.      . zolpidem (AMBIEN CR) 12.5 MG CR tablet Take 12.5 mg by mouth at bedtime as needed for sleep.       No current facility-administered medications for this visit.    Socially she is married has 3 children 6 grandchildren. No tobacco use. She does not routinely exercise but are use occasional alcohol.  ROS is negative for fevers, chills or night sweats. There is no significant weight loss. She does admit to fatigue, nonrestorative sleep. She has noticed an irregularity to her heart rhythm probably 24 hours after her cardioversion. She denies chest pain. There  is mild shortness of breath with activity. She denies change in bowel or bladder habits. She denies blood in her stool or hematuria. Her diabetes has not been well-controlled. She denies significant edema. She denies bleeding. She denies paresthesias.   She has been on chronic Coumadin therapy for approximately 8 years. She denies claudications. She denies paresthesias. Other 12 point system review is negative.  PE BP 130/80  Pulse 82  Ht 5' 5.5" (1.664 m)  Wt 204 lb (92.534 kg)  BMI 33.42 kg/m2  General: Alert, oriented, no distress.  Skin: normal turgor, no rashes HEENT: Normocephalic, atraumatic. Pupils round and reactive; sclera anicteric;no lid lag.  Nose without nasal septal hypertrophy Mouth/Parynx benign; Mallinpatti scale 3 Neck: No JVD, no carotid briuts Lungs: clear to ausculatation and percussion; no wheezing or rales Heart: Irregularly irregular with ventricular rate in the 80s.  s1 s2 normal 1/6 systolic murmur. Abdomen: soft, nontender; no hepatosplenomehaly, BS+; abdominal aorta nontender and not dilated by palpation. Pulses 2+ Extremities: no clubbing cyanosis or edema, Homan's sign negative  Neurologic: grossly nonfocal  ECG: Recurrent atrial fibrillation following cardioversion  with ventricular rate in the 80s. There are nonspecific  ST-T changes which may be contributed    LABS:  BMET    Component Value Date/Time   NA 136 06/20/2013 1250   K 4.8 06/20/2013 1250   CL 100 06/20/2013 1250   CO2 27 06/20/2013 1250   GLUCOSE 182* 06/20/2013 1250   BUN 20 06/20/2013 1250   CREATININE 0.91 06/20/2013 1250   CALCIUM 10.0 06/20/2013 1250     Hepatic Function Panel     Component Value Date/Time   PROT 7.3 06/20/2013 1250   ALBUMIN 4.4 06/20/2013 1250   AST 18 06/20/2013 1250   ALT 17 06/20/2013 1250   ALKPHOS 33* 06/20/2013 1250   BILITOT 0.5 06/20/2013 1250     CBC    Component Value Date/Time   WBC 7.9 06/20/2013 1250   RBC 5.18* 06/20/2013 1250   HGB 13.5 06/20/2013 1250   HCT 40.7 06/20/2013 1250   PLT 424* 06/20/2013 1250   MCV 78.6 06/20/2013 1250   MCH 26.1 06/20/2013 1250   MCHC 33.2 06/20/2013 1250   RDW 16.1* 06/20/2013 1250     BNP No results found for this basename: probnp    Lipid Panel     Component Value Date/Time   TRIG 486* 03/23/2013 1105   LDLCALC NOT CALC 03/23/2013 1105     RADIOLOGY: No results found.    ASSESSMENT AND PLAN: Ms. Kretz is an 79 -year-old who has a history of paroxysmal atrial fibrillation and has been on Coumadin anticoagulation therapy.  She remained in atrial fibrillation despite beta blocker, calcium channel blocker, Lanoxin, and most recently  propofenone therapy. On October 16 she underwent initially successful cardioversion but had a markedly prolonged sinus node recovery time following her one shock at 120 J which resulted in restoration of sinus rhythm. At that time, she was told to discontinue her Lanoxin. She suddenly developed bradycardia leading to a dose reduction of her beta blocker therapy but ultimately developed recurrent tachydysrhythmia and recurrent atrial fibrillation for which her dose of beta blocker was then further increase. She is back in atrial fibrillation today with ventricular rate in the 80s. I had a long discussion with  her. It is my recommendation that she be referred to Dr. Johney Frame for consideration of electrophysiologic evaluation of possible atrial fibrillation ablation. She does have a prolonged sinus node recovery time which  was detected following her initial synchronized countershock. I have recommended slight titration of her Toprol to 50 twice a day. I will schedule her to see Dr. Johney Frame as soon as possible. Discussions can be made with him concerning her candidacy for atrial fibrillation ablation versus alternative from her pharmacotherapeutic strategies. She will undergo her sleep study tonight and if she test positive for obstructive sleep apnea we will try to expedite her CPAP titration trial and initiation of CPAP therapy in light of her recurrent atrial fibrillation.  Lennette Bihari, MD, [facility]  06/30/2013 12:58 PM

## 2013-06-30 NOTE — Progress Notes (Signed)
06/30/2013 Amy Wright   1941-01-06  [id number]  Primary Physicia Amy Wright., MD Primary Cardiologist: Dr Amy Wright  HPI:  72 y/o female with a history of PAF dating back > 10 yrs. She had previously seen Dr Amy Wright and transferred her care to Dr Amy Wright in 2006. She had infrequent PAF that was self limiting until this summer. Dr Amy Wright saw her and placed her on Rythmol. The pt is very symptomatic when in AF, complaining of fatigue, palpitations, and an uncomfortable feeling in her chest. She has been chronically anticoagulated on Coumadin. Echo in July showed NL LVF with LA at upper limits of normal. She underwent DCCV as an OP on 06/23/13. This was complicated by a prolonged sinus recovery time. She called this past weekend saying her HR was slow, in the 40s, and I decreased her Toprol 50 mg BID to 50 mg QD and stopped her Lanoxin. She called earlier this week saying her HR was now back in AF around 100. Her Toprol was increased to 75 mg daily. She is here for follow up. She is scheduled for a sleep study tomorrow.   Current Outpatient Prescriptions  Medication Sig Dispense Refill  . Ascorbic Acid (VITAMIN C) 1000 MG tablet Take 1,000 mg by mouth. 3 tablets daily      . Canagliflozin (INVOKANA) 300 MG TABS Take 1 tablet by mouth daily.      . Cholecalciferol (VITAMIN D-3 PO) Take by mouth.      . Choline Fenofibrate (TRILIPIX) 135 MG capsule Take 135 mg by mouth daily.      Marland Kitchen diltiazem (CARDIZEM CD) 240 MG 24 hr capsule Take 240 mg by mouth daily.       Marland Kitchen ezetimibe (ZETIA) 10 MG tablet Take 10 mg by mouth daily.      . Fenofibrate 120 MG TABS Take by mouth.      . folic acid (FOLVITE) 1 MG tablet Take 1 mg by mouth daily.      Marland Kitchen glipiZIDE (GLUCOTROL) 10 MG tablet Take 10 mg by mouth daily.      Marland Kitchen LORazepam (ATIVAN) 0.5 MG tablet Take 1 tablet (0.5 mg total) by mouth as needed.  30 tablet  0  . losartan-hydrochlorothiazide (HYZAAR) 100-25 MG per tablet Take 1 tablet by mouth daily.      .  metFORMIN (GLUCOPHAGE) 1000 MG tablet Take 1,000 mg by mouth 2 (two) times daily with a meal.      . omega-3 acid ethyl esters (LOVAZA) 1 G capsule Take 1 capsule (1 g total) by mouth 2 (two) times daily. Take 2 capsules  240 capsule  3  . propafenone (RYTHMOL) 225 MG tablet Take 1 tablet (225 mg total) by mouth every 8 (eight) hours.  90 tablet  6  . sertraline (ZOLOFT) 100 MG tablet Take 100 mg by mouth daily.      . vitamin B-12 (CYANOCOBALAMIN) 100 MCG tablet Take 50 mcg by mouth daily.      Marland Kitchen warfarin (COUMADIN) 2.5 MG tablet Take 2.5 mg by mouth daily. Alt with 5 mg.      . zolpidem (AMBIEN CR) 12.5 MG CR tablet Take 12.5 mg by mouth at bedtime as needed for sleep.      . metoprolol succinate (TOPROL-XL) 50 MG 24 hr tablet Take 1 tablet (50 mg total) by mouth 2 (two) times daily.  60 tablet  11   No current facility-administered medications for this visit.    Allergies  Allergen Reactions  . Statins  myalgias    History   Social History  . Marital Status: Married    Spouse Name: N/A    Number of Children: N/A  . Years of Education: N/A   Occupational History  . Not on file.   Social History Main Topics  . Smoking status: Passive Smoke Exposure - Never Smoker    Types: Cigarettes  . Smokeless tobacco: Not on file  . Alcohol Use: No  . Drug Use: No  . Sexual Activity: Not on file   Other Topics Concern  . Not on file   Social History Narrative  . No narrative on file       Blood pressure 138/88, pulse 81, height 5\' 5"  (1.651 m), weight 204 lb (92.534 kg).  Heart: irregularly irregular rhythm  EKG AF with VR around 80  ASSESSMENT AND PLAN:   PAF (paroxysmal atrial fibrillation) SSS component and recurrent PAF after recent DCCV on Rythmol  Anticoagulated on Coumadin .  Essential hypertension .  DM2 (diabetes mellitus, type 2) .    PLAN  I discussed her case with Dr Amy Wright. The pt became upset and tearful when we suggested referral to Dr Amy Wright. I  have placed the pt on Dr Amy Wright schedule and he will see her today.  Amy Wright KPA-C 06/30/2013 2:14 PM

## 2013-07-04 ENCOUNTER — Telehealth: Payer: Self-pay | Admitting: *Deleted

## 2013-07-04 NOTE — Telephone Encounter (Signed)
Pt stated that she had a test done on Thursday and wants to know if it was read and I told her that Dr. Tresa Endo is not in the office until tomorrow and she asked if it was not read if someone else can read this study and call her back. She stated that she needs to know something ASAP because she is in a-fib and is very anxious.

## 2013-07-04 NOTE — Telephone Encounter (Signed)
Returned a call to patient informing her that Dr. Tresa Endo may or may not have read her sleep study over the weekend. He is the only MD for our practice that can read the study. patient is very anxious about getting a CPAP machine if she needs to have one. I explained toher the process and how it works. Letting her know that it is not a fast process. If the sleep study is suggestive of sleep apnea she will have to have a titration study. This in turn also has to be interpreted before she can be referred out for a machine. I will ask Dr. Tresa Endo tomorrow if he had the chance over the weekend to read her study.

## 2013-07-04 NOTE — Telephone Encounter (Signed)
Message forwarded to W. Waddell, CMA.  

## 2013-07-08 ENCOUNTER — Ambulatory Visit: Payer: Medicare Other | Admitting: Cardiology

## 2013-07-12 ENCOUNTER — Telehealth: Payer: Self-pay | Admitting: Cardiovascular Disease

## 2013-07-12 NOTE — Telephone Encounter (Signed)
Message routed to [place].

## 2013-07-12 NOTE — Telephone Encounter (Signed)
She wants Amy Wright to call her-concerning her sleep study.

## 2013-07-12 NOTE — Telephone Encounter (Signed)
Only wants to talk w/ wanda.  Message routed for Wanda's review.

## 2013-07-13 NOTE — Telephone Encounter (Signed)
Returned patient's call she is really anxious to get her sleep study results from last Thursday night. Informed her that Dr. Tresa Endo did read some studies over the weekend, however I don't know if hers was one of them. Informed her again that there is a process to this and it could take a couple of weeks before she will actually get her CPAP machine, however I will do everything that I can do to expedite the process.

## 2013-07-14 ENCOUNTER — Other Ambulatory Visit: Payer: Self-pay

## 2013-07-22 ENCOUNTER — Telehealth: Payer: Self-pay | Admitting: *Deleted

## 2013-07-22 NOTE — Telephone Encounter (Signed)
Faxed signed supply order back to [facility].

## 2013-08-05 ENCOUNTER — Ambulatory Visit (INDEPENDENT_AMBULATORY_CARE_PROVIDER_SITE_OTHER): Payer: Medicare Other | Admitting: Internal Medicine

## 2013-08-05 ENCOUNTER — Encounter: Payer: Self-pay | Admitting: Internal Medicine

## 2013-08-05 VITALS — BP 148/84 | HR 86 | Ht 65.5 in | Wt 205.1 lb

## 2013-08-05 DIAGNOSIS — Z5181 Encounter for therapeutic drug level monitoring: Secondary | ICD-10-CM

## 2013-08-05 DIAGNOSIS — G4733 Obstructive sleep apnea (adult) (pediatric): Secondary | ICD-10-CM

## 2013-08-05 DIAGNOSIS — Z7901 Long term (current) use of anticoagulants: Secondary | ICD-10-CM

## 2013-08-05 DIAGNOSIS — I10 Essential (primary) hypertension: Secondary | ICD-10-CM

## 2013-08-05 DIAGNOSIS — I4891 Unspecified atrial fibrillation: Secondary | ICD-10-CM

## 2013-08-05 MED ORDER — FUROSEMIDE 20 MG PO TABS: 20.0000 mg | ORAL_TABLET | Freq: Every day | ORAL | Status: DC

## 2013-08-05 MED ORDER — LOSARTAN POTASSIUM 100 MG PO TABS: 100.0000 mg | ORAL_TABLET | Freq: Every day | ORAL | Status: DC

## 2013-08-05 NOTE — Patient Instructions (Signed)
Your physician has recommended you make the following change in your medication:  1) Stop Hyzaar 2) Stop Propafenone 3) Start Losartan 100 mg daily 4) Start Furosemide 20 mg daily  Your physician is recommending Tikosyn loading:  Please come to the office to see Kennon Rounds (pharmacist) for lab work on Tuesday 08/09/2013. After, you will go to hospital to be admitted for Tikosyn loading - 3 day hospital stay.

## 2013-08-05 NOTE — Progress Notes (Signed)
Primary Care Physician: Angelica Chessman., MD Referring Physician: Dr Tresa Endo   Amy Wright is a 72 y.o. female with a h/o diabetes, hypertension, sleep apnea (on CPAP), and atrial fibrillation.  She was first diagnosed with atrial fibrillation about 10 years ago and had relatively infrequent episodes (one every couple of years - these were self terminating and did not require cardioversion).  She had increasing frequency and duration of afib thereafter. She went into atrial fibrillation about 7 months ago and this has persisted.  She was initially placed on propafenone 150mg  tid which failed to convert her to SR.  She then had her propafenone dose increased to 225mg  tid and underwent cardioversion last month, but had recurrence of atrial fibrillation after a couple of days. She is symptomatic with atrial fibrillation with fatigue and shortness of breath.  She also had some bradycardia post cardioversion and her beta blocker was reduced.   Last echo 03/2013 demonstrated an EF of  55-60%, no RWMA, mild MR, LA upper limits of normal (no measurement available).   Today, she denies symptoms of palpitations, chest pain,  orthopnea, PND, lower extremity edema, dizziness, presyncope, syncope, or neurologic sequela. The patient is tolerating medications without difficulties and is otherwise without complaint today.   Past Medical History  Diagnosis Date  . Persistent atrial fibrillation   . Hyperlipidemia   . Hypertriglyceridemia   . 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 Central jet. Aortic Valve appears to be mildly sclerotic. Trace aortic regurgitation.EF >55%  . Diabetes mellitus without complication     type 2  . Hypertension   . Obesity   . Obstructive sleep apnea     uses CPAP   Past Surgical History  Procedure Laterality Date  . Cesarean section  1971  . Breast enhancement surgery  1985  . Cardioversion N/A 06/23/2013    Procedure:  CARDIOVERSION;  Surgeon: Lennette Bihari, MD;  Location: [facility] [place] ENDOSCOPY;  Service: Cardiovascular;  Laterality: N/A;    Current Outpatient Prescriptions  Medication Sig Dispense Refill  . Ascorbic Acid (VITAMIN C) 1000 MG tablet Take 1,000 mg by mouth 3 (three) times daily.       . Canagliflozin (INVOKANA) 300 MG TABS Take 1 tablet by mouth daily.      . Cholecalciferol (VITAMIN D-3 PO) Take 1,000 mg by mouth daily.       . Choline Fenofibrate (TRILIPIX) 135 MG capsule Take 135 mg by mouth daily.      Marland Kitchen diltiazem (CARDIZEM CD) 240 MG 24 hr capsule Take 240 mg by mouth daily.       Marland Kitchen ezetimibe (ZETIA) 10 MG tablet Take 10 mg by mouth daily.      . Fenofibrate 120 MG TABS Take 1 tablet by mouth daily.       . folic acid (FOLVITE) 1 MG tablet Take 1 mg by mouth daily.      Marland Kitchen glipiZIDE (GLUCOTROL) 10 MG tablet Take 10 mg by mouth daily.      Marland Kitchen LORazepam (ATIVAN) 0.5 MG tablet Take 1 tablet (0.5 mg total) by mouth as needed.  30 tablet  0  . metFORMIN (GLUCOPHAGE) 1000 MG tablet Take 1,000 mg by mouth 2 (two) times daily with a meal.      . metoprolol succinate (TOPROL-XL) 50 MG 24 hr tablet Take 1 tablet (50 mg total) by mouth 2 (two) times daily.  60 tablet  11  . omega-3 acid ethyl esters (LOVAZA) 1 G  capsule Take 1 capsule (1 g total) by mouth 2 (two) times daily. Take 2 capsules  240 capsule  3  . sertraline (ZOLOFT) 100 MG tablet Take 100 mg by mouth daily.      . vitamin B-12 (CYANOCOBALAMIN) 100 MCG tablet Take 50 mcg by mouth daily. Pt is currently not taking this everyday      . warfarin (COUMADIN) 2.5 MG tablet Take 2.5 mg by mouth daily. Alt with 5 mg.      . zolpidem (AMBIEN CR) 12.5 MG CR tablet Take 12.5 mg by mouth at bedtime as needed for sleep.      . furosemide (LASIX) 20 MG tablet Take 1 tablet (20 mg total) by mouth daily.  30 tablet  3  . losartan (COZAAR) 100 MG tablet Take 1 tablet (100 mg total) by mouth daily.  30 tablet  3   No current facility-administered medications for  this visit.    Allergies  Allergen Reactions  . Statins     myalgias    History   Social History  . Marital Status: Married    Spouse Name: N/A    Number of Children: N/A  . Years of Education: N/A   Occupational History  . Not on file.   Social History Main Topics  . Smoking status: Passive Smoke Exposure - Never Smoker    Types: Cigarettes  . Smokeless tobacco: Not on file  . Alcohol Use: No  . Drug Use: No  . Sexual Activity: Not on file   Other Topics Concern  . Not on file   Social History Narrative  . No narrative on file    Family History  Problem Relation Age of Onset  . Heart attack Mother   . Cardiomyopathy Mother   . Cancer - Lung Father   . Cancer - Other Maternal Grandmother   . Diabetes Maternal Grandfather   . Stroke Paternal Grandfather     ROS- All systems are reviewed and negative except as per the HPI above  Physical Exam: Filed Vitals:   08/05/13 1052  BP: 148/84  Pulse: 86  Height: 5' 5.5" (1.664 m)  Weight: 205 lb 1.9 oz (93.042 kg)    GEN- The patient is overweight appearing, alert and oriented x 3 today.   Head- normocephalic, atraumatic Eyes-  Sclera clear, conjunctiva pink Ears- hearing intact Oropharynx- clear Neck- supple, no JVP Lymph- no cervical lymphadenopathy Lungs- Clear to ausculation bilaterally, normal work of breathing Heart- irregular rate and rhythm, no murmurs, rubs or gallops, PMI not laterally displaced GI- soft, NT, ND, + BS Extremities- no clubbing, cyanosis, or edema MS- no significant deformity or atrophy Skin- no rash or lesion Psych- euthymic mood, full affect Neuro- strength and sensation are intact  EKG- atrial fibrillation, ventricular rate 85, QRS 84, Qtc 307  Assessment and Plan: 1.  Atrial fibrillation The patient has symptomatic persistent atrial fibrillation.  She has failed medical therapy with rhythmol. Therapeutic strategies for afib including medicine and ablation were discussed  in detail with the patient today. Risk, benefits, and alternatives to EP study and radiofrequency ablation for afib were also discussed in detail today.  At this time, she would prefer to continue medical therapy. I would recommend tikosyn and she wishes to proceed with this.  I will stop rhythmol today and start tikosyn once her rhythmol has adequately washed out. Continue coumadin long term (CHADS2VASC is at least 4)  2.  Sleep apnea Compliance with CPAP is encouraged  3.  Hypertension Stop hctz (in preparation for starting tikosyn) Will add low dose lasix for diuresis  Return to see me in 6 weeks

## 2013-08-07 ENCOUNTER — Encounter: Payer: Self-pay | Admitting: Internal Medicine

## 2013-08-07 DIAGNOSIS — G4733 Obstructive sleep apnea (adult) (pediatric): Secondary | ICD-10-CM | POA: Insufficient documentation

## 2013-08-08 ENCOUNTER — Telehealth: Payer: Self-pay | Admitting: Internal Medicine

## 2013-08-08 NOTE — Telephone Encounter (Signed)
Spoke with pt.  Verified medications from her visit with Dr. Johney Frame last week.

## 2013-08-08 NOTE — Telephone Encounter (Signed)
Follow Up  Pt called states that she requests a call back to discuss which medications she should or should not take due to the procedure she is having tomorrow. Pt requests to speak with sally. Please assist.

## 2013-08-09 ENCOUNTER — Inpatient Hospital Stay (HOSPITAL_COMMUNITY)
Admission: AD | Admit: 2013-08-09 | Discharge: 2013-08-12 | DRG: 310 | Disposition: A | Discharge status: Home / Self Care | Payer: Medicare Other | Source: Ambulatory Visit | Attending: Internal Medicine | Admitting: Internal Medicine

## 2013-08-09 ENCOUNTER — Encounter (HOSPITAL_COMMUNITY): Payer: Self-pay | Admitting: General Practice

## 2013-08-09 ENCOUNTER — Ambulatory Visit (INDEPENDENT_AMBULATORY_CARE_PROVIDER_SITE_OTHER): Payer: Medicare Other | Admitting: Pharmacist

## 2013-08-09 VITALS — Wt 209.8 lb

## 2013-08-09 DIAGNOSIS — I4819 Other persistent atrial fibrillation: Secondary | ICD-10-CM

## 2013-08-09 DIAGNOSIS — Z79899 Other long term (current) drug therapy: Secondary | ICD-10-CM

## 2013-08-09 DIAGNOSIS — E119 Type 2 diabetes mellitus without complications: Secondary | ICD-10-CM | POA: Diagnosis present

## 2013-08-09 DIAGNOSIS — E785 Hyperlipidemia, unspecified: Secondary | ICD-10-CM | POA: Diagnosis present

## 2013-08-09 DIAGNOSIS — I4891 Unspecified atrial fibrillation: Principal | ICD-10-CM | POA: Diagnosis present

## 2013-08-09 DIAGNOSIS — E669 Obesity, unspecified: Secondary | ICD-10-CM | POA: Diagnosis present

## 2013-08-09 DIAGNOSIS — E781 Pure hyperglyceridemia: Secondary | ICD-10-CM | POA: Diagnosis present

## 2013-08-09 DIAGNOSIS — Z7901 Long term (current) use of anticoagulants: Secondary | ICD-10-CM

## 2013-08-09 DIAGNOSIS — G4733 Obstructive sleep apnea (adult) (pediatric): Secondary | ICD-10-CM | POA: Diagnosis present

## 2013-08-09 DIAGNOSIS — I359 Nonrheumatic aortic valve disorder, unspecified: Secondary | ICD-10-CM | POA: Diagnosis present

## 2013-08-09 DIAGNOSIS — Z6838 Body mass index (BMI) 38.0-38.9, adult: Secondary | ICD-10-CM

## 2013-08-09 DIAGNOSIS — I495 Sick sinus syndrome: Secondary | ICD-10-CM | POA: Diagnosis present

## 2013-08-09 DIAGNOSIS — I10 Essential (primary) hypertension: Secondary | ICD-10-CM | POA: Diagnosis present

## 2013-08-09 HISTORY — DX: Anxiety disorder, unspecified: F41.9

## 2013-08-09 HISTORY — DX: Type 2 diabetes mellitus without complications: E11.9

## 2013-08-09 HISTORY — DX: Obstructive sleep apnea (adult) (pediatric): G47.33

## 2013-08-09 HISTORY — DX: Obstructive sleep apnea (adult) (pediatric): Z99.89

## 2013-08-09 HISTORY — DX: Psychophysiologic insomnia: F51.04

## 2013-08-09 LAB — BASIC METABOLIC PANEL
CO2: 25 mEq/L (ref 19–32)
Calcium: 9.8 mg/dL (ref 8.4–10.5)
Chloride: 105 mEq/L (ref 96–112)
Creatinine, Ser: 0.7 mg/dL (ref 0.4–1.2)
Potassium: 4.6 mEq/L (ref 3.5–5.1)
Sodium: 137 mEq/L (ref 135–145)

## 2013-08-09 LAB — PROTIME-INR: Prothrombin Time: 25.3 s — ABNORMAL HIGH (ref 10.2–12.4)

## 2013-08-09 LAB — MAGNESIUM: Magnesium: 2.1 mg/dL (ref 1.5–2.5)

## 2013-08-09 MED ORDER — SODIUM CHLORIDE 0.9 % IV SOLN: 250.0000 mL | INTRAVENOUS | Status: DC | PRN

## 2013-08-09 MED ORDER — METOPROLOL SUCCINATE ER 50 MG PO TB24
50.0000 mg | ORAL_TABLET | Freq: Two times a day (BID) | ORAL | Status: DC
Start: 2013-08-09 — End: 2013-08-12
  Administered 2013-08-09 – 2013-08-12 (×6): 50 mg via ORAL
  Filled 2013-08-09 (×7): qty 1

## 2013-08-09 MED ORDER — ZOLPIDEM TARTRATE 5 MG PO TABS: 12.5000 mg | ORAL_TABLET | Freq: Every evening | ORAL | Status: DC | PRN

## 2013-08-09 MED ORDER — ZOLPIDEM TARTRATE 5 MG PO TABS
5.0000 mg | ORAL_TABLET | Freq: Every evening | ORAL | Status: DC | PRN
  Administered 2013-08-09: 5 mg via ORAL
  Filled 2013-08-09: qty 1

## 2013-08-09 MED ORDER — FUROSEMIDE 20 MG PO TABS
20.0000 mg | ORAL_TABLET | Freq: Every day | ORAL | Status: DC
  Administered 2013-08-10 – 2013-08-12 (×3): 20 mg via ORAL
  Filled 2013-08-09 (×3): qty 1

## 2013-08-09 MED ORDER — LORAZEPAM 0.5 MG PO TABS
0.5000 mg | ORAL_TABLET | Freq: Every day | ORAL | Status: DC | PRN
  Administered 2013-08-11: 0.5 mg via ORAL
  Filled 2013-08-09: qty 1

## 2013-08-09 MED ORDER — FOLIC ACID 1 MG PO TABS
1.0000 mg | ORAL_TABLET | Freq: Every day | ORAL | Status: DC
  Administered 2013-08-10 – 2013-08-12 (×2): 1 mg via ORAL
  Filled 2013-08-09 (×4): qty 1

## 2013-08-09 MED ORDER — CANAGLIFLOZIN 300 MG PO TABS
1.0000 | ORAL_TABLET | Freq: Every day | ORAL | Status: DC
  Administered 2013-08-10 – 2013-08-12 (×3): 300 mg via ORAL
  Filled 2013-08-09 (×5): qty 1

## 2013-08-09 MED ORDER — ZOLPIDEM TARTRATE 5 MG PO TABS: 7.5000 mg | ORAL_TABLET | Freq: Once | ORAL | Status: DC

## 2013-08-09 MED ORDER — WARFARIN - PHARMACIST DOSING INPATIENT
Freq: Every day | Status: DC
  Administered 2013-08-09: 18:00:00

## 2013-08-09 MED ORDER — SERTRALINE HCL 100 MG PO TABS
100.0000 mg | ORAL_TABLET | Freq: Every day | ORAL | Status: DC
  Administered 2013-08-10 – 2013-08-12 (×2): 100 mg via ORAL
  Filled 2013-08-09 (×4): qty 1

## 2013-08-09 MED ORDER — WARFARIN SODIUM 2.5 MG PO TABS
2.5000 mg | ORAL_TABLET | Freq: Every day | ORAL | Status: AC
  Administered 2013-08-09: 2.5 mg via ORAL
  Filled 2013-08-09: qty 1

## 2013-08-09 MED ORDER — VITAMIN B-12 100 MCG PO TABS
50.0000 ug | ORAL_TABLET | Freq: Every day | ORAL | Status: DC
  Administered 2013-08-10 – 2013-08-12 (×2): 50 ug via ORAL
  Filled 2013-08-09 (×4): qty 1

## 2013-08-09 MED ORDER — SODIUM CHLORIDE 0.9 % IJ SOLN: 3.0000 mL | INTRAMUSCULAR | Status: DC | PRN

## 2013-08-09 MED ORDER — METFORMIN HCL 500 MG PO TABS
1000.0000 mg | ORAL_TABLET | Freq: Two times a day (BID) | ORAL | Status: DC
  Administered 2013-08-09 – 2013-08-12 (×6): 1000 mg via ORAL
  Filled 2013-08-09 (×9): qty 2

## 2013-08-09 MED ORDER — LOSARTAN POTASSIUM 50 MG PO TABS
100.0000 mg | ORAL_TABLET | Freq: Every day | ORAL | Status: DC
  Administered 2013-08-10 – 2013-08-12 (×3): 100 mg via ORAL
  Filled 2013-08-09 (×4): qty 2

## 2013-08-09 MED ORDER — SODIUM CHLORIDE 0.9 % IJ SOLN
3.0000 mL | Freq: Two times a day (BID) | INTRAMUSCULAR | Status: DC
  Administered 2013-08-10: 3 mL via INTRAVENOUS

## 2013-08-09 MED ORDER — MELATONIN 3 MG PO TABS
1.0000 | ORAL_TABLET | Freq: Every day | ORAL | Status: DC
  Filled 2013-08-09: qty 1

## 2013-08-09 MED ORDER — DOFETILIDE 500 MCG PO CAPS
500.0000 ug | ORAL_CAPSULE | Freq: Two times a day (BID) | ORAL | Status: DC
  Filled 2013-08-09: qty 1

## 2013-08-09 MED ORDER — DOFETILIDE 500 MCG PO CAPS
500.0000 ug | ORAL_CAPSULE | Freq: Two times a day (BID) | ORAL | Status: DC
  Administered 2013-08-09 – 2013-08-11 (×4): 500 ug via ORAL
  Filled 2013-08-09 (×7): qty 1

## 2013-08-09 MED ORDER — GLIPIZIDE 10 MG PO TABS
10.0000 mg | ORAL_TABLET | Freq: Every day | ORAL | Status: DC
  Administered 2013-08-10 – 2013-08-12 (×3): 10 mg via ORAL
  Filled 2013-08-09 (×4): qty 1

## 2013-08-09 MED ORDER — DILTIAZEM HCL ER COATED BEADS 240 MG PO CP24
240.0000 mg | ORAL_CAPSULE | Freq: Every evening | ORAL | Status: DC
  Administered 2013-08-09 – 2013-08-11 (×3): 240 mg via ORAL
  Filled 2013-08-09 (×4): qty 1

## 2013-08-09 NOTE — Assessment & Plan Note (Signed)
Reviewed pt's labs.  K- 4.6, Mg- 2.1, INR- 2.4.  Acceptable for starting Tikosyn.  SCr- 0.7; CrCl- 169mL/min.  Would start with Tikosyn BID.  Pt has a 7 day follow up scheduled for post-discharge.

## 2013-08-09 NOTE — Progress Notes (Signed)
HPI Amy Wright is a 72 y.o. female with a h/o diabetes, hypertension, sleep apnea (on CPAP), and atrial fibrillation.  She was first diagnosed with atrial fibrillation about 10 years ago and had relatively infrequent episodes (one every couple of years - these were self terminating and did not require cardioversion).  She had increasing frequency and duration of afib thereafter. She went into atrial fibrillation about 7 months ago and this has persisted.  She was initially placed on propafenone 150mg  tid which failed to convert her to SR.  She then had her propafenone dose increased to 225mg  tid and underwent cardioversion last month, but had recurrence of atrial fibrillation after a couple of days. She is symptomatic with atrial fibrillation with fatigue and shortness of breath.  She also had some bradycardia post cardioversion and her beta blocker was reduced.   She was seen in consult by Dr. Johney Frame on 11/28.  Given her symptomatic, persistent afib, medical therapy versus ablation was discussed.  Pt would prefer to continue with medical therapy at this time.  She was agreeable to try Tikosyn.   Reviewed pt's medication list.  She stopped taking propafenone on 11/28.  Her HCTZ was changed to furosemide on that day as well.  She is currently not taking any other QTc prolongating or contraindicated medications.    EKG reviewed by Dr. Ladona Ridgel.  Afib with vent rate of 97 bpm.  QTc- 434 msec   Pt anticoagulated with Coumadin.  Her last INR check was prior to her cardioversion in October.  This was therapeutic.  Prior to that, last check by PCP was in May and also therapeutic (2.3).  Reviewed potential side effects including QTc prolongation and importance of compliance with patient.  She is aware to call our office if she misses more than 2 doses in a row.    Current Outpatient Prescriptions  Medication Sig Dispense Refill  . Ascorbic Acid (VITAMIN C) 1000 MG tablet Take 1,000 mg by mouth 3 (three)  times daily.       . Canagliflozin (INVOKANA) 300 MG TABS Take 1 tablet by mouth daily.      . Cholecalciferol (VITAMIN D-3 PO) Take 1,000 mg by mouth daily.       . Choline Fenofibrate (TRILIPIX) 135 MG capsule Take 135 mg by mouth daily.      Marland Kitchen diltiazem (CARDIZEM CD) 240 MG 24 hr capsule Take 240 mg by mouth daily.       Marland Kitchen ezetimibe (ZETIA) 10 MG tablet Take 10 mg by mouth daily.      . Fenofibrate 120 MG TABS Take 1 tablet by mouth daily.       . folic acid (FOLVITE) 1 MG tablet Take 1 mg by mouth daily.      . furosemide (LASIX) 20 MG tablet Take 1 tablet (20 mg total) by mouth daily.  30 tablet  3  . glipiZIDE (GLUCOTROL) 10 MG tablet Take 10 mg by mouth daily.      Marland Kitchen LORazepam (ATIVAN) 0.5 MG tablet Take 1 tablet (0.5 mg total) by mouth as needed.  30 tablet  0  . losartan (COZAAR) 100 MG tablet Take 1 tablet (100 mg total) by mouth daily.  30 tablet  3  . metFORMIN (GLUCOPHAGE) 1000 MG tablet Take 1,000 mg by mouth 2 (two) times daily with a meal.      . metoprolol succinate (TOPROL-XL) 50 MG 24 hr tablet Take 1 tablet (50 mg total) by mouth 2 (two) times daily.  60 tablet  11  . omega-3 acid ethyl esters (LOVAZA) 1 G capsule Take 1 capsule (1 g total) by mouth 2 (two) times daily. Take 2 capsules  240 capsule  3  . sertraline (ZOLOFT) 100 MG tablet Take 100 mg by mouth daily.      . vitamin B-12 (CYANOCOBALAMIN) 100 MCG tablet Take 50 mcg by mouth daily. Pt is currently not taking this everyday      . warfarin (COUMADIN) 2.5 MG tablet Take 2.5 mg by mouth daily. Alt with 5 mg.      . zolpidem (AMBIEN CR) 12.5 MG CR tablet Take 12.5 mg by mouth at bedtime as needed for sleep.       No current facility-administered medications for this visit.    Allergies  Allergen Reactions  . Statins     myalgias

## 2013-08-09 NOTE — Progress Notes (Signed)

## 2013-08-09 NOTE — H&P (Signed)
Primary Care Physician: Angelica Chessman., MD  Referring Physician: Dr Tresa Endo   Amy Wright is a 72 y.o. female with a h/o diabetes, hypertension, sleep apnea (on CPAP), and atrial fibrillation. She was first diagnosed with atrial fibrillation about 10 years ago and had relatively infrequent episodes (one every couple of years - these were self terminating and did not require cardioversion). She had increasing frequency and duration of afib thereafter. She went into atrial fibrillation about 7 months ago and this has persisted. She was initially placed on propafenone 150mg  tid which failed to convert her to SR. She then had her propafenone dose increased to 225mg  tid and underwent cardioversion last month, but had recurrence of atrial fibrillation after a couple of days. She is symptomatic with atrial fibrillation with fatigue and shortness of breath. She also had some bradycardia post cardioversion and her beta blocker was reduced.  Last echo 03/2013 demonstrated an EF of 55-60%, no RWMA, mild MR, LA upper limits of normal (no measurement available).  Today, she denies symptoms of palpitations, chest pain, orthopnea, PND, lower extremity edema, dizziness, presyncope, syncope, or neurologic sequela. The patient is tolerating medications without difficulties and is otherwise without complaint today.   Past Medical History   Diagnosis  Date   .  Persistent atrial fibrillation    .  Hyperlipidemia    .  Hypertriglyceridemia    .  Valvular sclerosis  04/30/11     ECHO-Mitral Valve- mild to moderate mitral regurgitation. Central jet. Aortic Valve appears to be mildly sclerotic. Trace aortic regurgitation.EF >55%   .  Diabetes mellitus without complication      type 2   .  Hypertension    .  Obesity    .  Obstructive sleep apnea      uses CPAP    Past Surgical History   Procedure  Laterality  Date   .  Cesarean section   1971   .  Breast enhancement surgery   1985   .  Cardioversion  N/A   06/23/2013     Procedure: CARDIOVERSION; Surgeon: Lennette Bihari, MD; Location: [facility] ENDOSCOPY; Service: Cardiovascular; Laterality: N/A;    Current Outpatient Prescriptions   Medication  Sig  Dispense  Refill   .  Ascorbic Acid (VITAMIN C) 1000 MG tablet  Take 1,000 mg by mouth 3 (three) times daily.     .  Canagliflozin (INVOKANA) 300 MG TABS  Take 1 tablet by mouth daily.     .  Cholecalciferol (VITAMIN D-3 PO)  Take 1,000 mg by mouth daily.     .  Choline Fenofibrate (TRILIPIX) 135 MG capsule  Take 135 mg by mouth daily.     Marland Kitchen  diltiazem (CARDIZEM CD) 240 MG 24 hr capsule  Take 240 mg by mouth daily.     Marland Kitchen  ezetimibe (ZETIA) 10 MG tablet  Take 10 mg by mouth daily.     .  Fenofibrate 120 MG TABS  Take 1 tablet by mouth daily.     .  folic acid (FOLVITE) 1 MG tablet  Take 1 mg by mouth daily.     Marland Kitchen  glipiZIDE (GLUCOTROL) 10 MG tablet  Take 10 mg by mouth daily.     Marland Kitchen  LORazepam (ATIVAN) 0.5 MG tablet  Take 1 tablet (0.5 mg total) by mouth as needed.  30 tablet  0   .  metFORMIN (GLUCOPHAGE) 1000 MG tablet  Take 1,000 mg by mouth 2 (two) times daily with a meal.     .  metoprolol succinate (TOPROL-XL) 50 MG 24 hr tablet  Take 1 tablet (50 mg total) by mouth 2 (two) times daily.  60 tablet  11   .  omega-3 acid ethyl esters (LOVAZA) 1 G capsule  Take 1 capsule (1 g total) by mouth 2 (two) times daily. Take 2 capsules  240 capsule  3   .  sertraline (ZOLOFT) 100 MG tablet  Take 100 mg by mouth daily.     .  vitamin B-12 (CYANOCOBALAMIN) 100 MCG tablet  Take 50 mcg by mouth daily. Pt is currently not taking this everyday     .  warfarin (COUMADIN) 2.5 MG tablet  Take 2.5 mg by mouth daily. Alt with 5 mg.     .  zolpidem (AMBIEN CR) 12.5 MG CR tablet  Take 12.5 mg by mouth at bedtime as needed for sleep.     .  furosemide (LASIX) 20 MG tablet  Take 1 tablet (20 mg total) by mouth daily.  30 tablet  3   .  losartan (COZAAR) 100 MG tablet  Take 1 tablet (100 mg total) by mouth daily.  30 tablet  3     No current facility-administered medications for this visit.    Allergies   Allergen  Reactions   .  Statins      myalgias    History    Social History   .  Marital Status:  Married     Spouse Name:  N/A     Number of Children:  N/A   .  Years of Education:  N/A    Occupational History   .  Not on file.    Social History Main Topics   .  Smoking status:  Passive Smoke Exposure - Never Smoker     Types:  Cigarettes   .  Smokeless tobacco:  Not on file   .  Alcohol Use:  No   .  Drug Use:  No   .  Sexual Activity:  Not on file    Other Topics  Concern   .  Not on file    Social History Narrative   .  No narrative on file    Family History   Problem  Relation  Age of Onset   .  Heart attack  Mother    .  Cardiomyopathy  Mother    .  Cancer - Lung  Father    .  Cancer - Other  Maternal Grandmother    .  Diabetes  Maternal Grandfather    .  Stroke  Paternal Grandfather    ROS- All systems are reviewed and negative except as per the HPI above  Physical Exam:  Filed Vitals:    08/05/13 1052   BP:  148/84   Pulse:  86   Height:  5' 5.5" (1.664 m)   Weight:  205 lb 1.9 oz (93.042 kg)    GEN- The patient is overweight appearing, alert and oriented x 3 today.  Head- normocephalic, atraumatic  Eyes- Sclera clear, conjunctiva pink  Ears- hearing intact  Oropharynx- clear  Neck- supple, no JVP  Lymph- no cervical lymphadenopathy  Lungs- Clear to ausculation bilaterally, normal work of breathing  Heart- irregular rate and rhythm, no murmurs, rubs or gallops, PMI not laterally displaced  GI- soft, NT, ND, + BS  Extremities- no clubbing, cyanosis, or edema  MS- no significant deformity or atrophy  Skin- no rash or lesion  Psych- euthymic mood, full affect  Neuro- strength and sensation are intact  EKGs are reviewed  Assessment and Plan:  1. Atrial fibrillation  The patient has symptomatic persistent atrial fibrillation. She has failed medical therapy with  rhythmol. Continue coumadin long term (CHADS2VASC is at least 4).  She is now admitted for initiation of tikosyn.  2. HTN Stable No change required today

## 2013-08-10 LAB — BASIC METABOLIC PANEL
BUN: 16 mg/dL (ref 6–23)
Calcium: 9.4 mg/dL (ref 8.4–10.5)
Chloride: 106 mEq/L (ref 96–112)
GFR calc Af Amer: >90 mL/min (ref >90)
GFR calc non Af Amer: 88 mL/min — ABNORMAL LOW (ref >90)
Glucose, Bld: 112 mg/dL — ABNORMAL HIGH (ref 70–99)
Potassium: 3.7 mEq/L (ref 3.5–5.1)
Sodium: 143 mEq/L (ref 135–145)

## 2013-08-10 LAB — GLUCOSE, CAPILLARY
Glucose-Capillary: 105 mg/dL — ABNORMAL HIGH (ref 70–99)
Glucose-Capillary: 77 mg/dL (ref 70–99)
Glucose-Capillary: 106 mg/dL — ABNORMAL HIGH (ref 70–99)

## 2013-08-10 LAB — MAGNESIUM: Magnesium: 2 mg/dL (ref 1.5–2.5)

## 2013-08-10 MED ORDER — WARFARIN SODIUM 2.5 MG PO TABS: 2.5000 mg | ORAL_TABLET | ORAL | Status: DC

## 2013-08-10 MED ORDER — POTASSIUM CHLORIDE CRYS ER 20 MEQ PO TBCR
40.0000 meq | EXTENDED_RELEASE_TABLET | Freq: Once | ORAL | Status: AC
  Administered 2013-08-10: 40 meq via ORAL
  Filled 2013-08-10: qty 2

## 2013-08-10 MED ORDER — ZOLPIDEM TARTRATE 5 MG PO TABS
5.0000 mg | ORAL_TABLET | Freq: Every evening | ORAL | Status: DC | PRN
  Administered 2013-08-10 – 2013-08-11 (×2): 5 mg via ORAL
  Filled 2013-08-10 (×2): qty 1

## 2013-08-10 MED ORDER — WARFARIN SODIUM 5 MG PO TABS
5.0000 mg | ORAL_TABLET | ORAL | Status: DC
  Administered 2013-08-11: 5 mg via ORAL
  Filled 2013-08-10: qty 1

## 2013-08-10 MED ORDER — WARFARIN SODIUM 5 MG PO TABS
5.0000 mg | ORAL_TABLET | ORAL | Status: DC
  Filled 2013-08-10: qty 1

## 2013-08-10 MED ORDER — WARFARIN SODIUM 2.5 MG PO TABS
2.5000 mg | ORAL_TABLET | ORAL | Status: DC
  Filled 2013-08-10: qty 1

## 2013-08-10 MED ORDER — WARFARIN SODIUM 2.5 MG PO TABS
2.5000 mg | ORAL_TABLET | ORAL | Status: DC
  Administered 2013-08-10: 2.5 mg via ORAL
  Filled 2013-08-10: qty 1

## 2013-08-10 MED ORDER — SODIUM CHLORIDE 0.9 % IJ SOLN: 3.0000 mL | INTRAMUSCULAR | Status: DC | PRN

## 2013-08-10 MED ORDER — WARFARIN - PHYSICIAN DOSING INPATIENT: Freq: Every day | Status: DC

## 2013-08-10 MED ORDER — SODIUM CHLORIDE 0.9 % IJ SOLN
3.0000 mL | Freq: Two times a day (BID) | INTRAMUSCULAR | Status: DC
  Administered 2013-08-10 – 2013-08-11 (×2): 3 mL via INTRAVENOUS

## 2013-08-10 MED ORDER — SODIUM CHLORIDE 0.9 % IV SOLN: 250.0000 mL | INTRAVENOUS | Status: DC

## 2013-08-10 NOTE — Care Management Note (Addendum)
    Page 1 of 1   08/12/2013     10:38:24 AM   CARE MANAGEMENT NOTE 08/12/2013  Patient:  Amy Wright,Amy Wright   Account Number:  [id number]  Date Initiated:  08/10/2013  Documentation initiated by:  GRAVES-BIGELOW,Meghana Tullo  Subjective/Objective Assessment:   Pt admitted with Atrial fibrillation. Plan for Tikosyn Load.     Action/Plan:   Benefits check in process and will make pt aware once complete. CM will assist with 7 day supply of Tikosyn from main pharmacy. MD please write Rx for 7 day supply no refills and then the original Rx with refills.   Anticipated DC Date:  08/12/2013   Anticipated DC Plan:  HOME/SELF CARE      DC Planning Services  CM consult  Medication Assistance      Choice offered to / List presented to:             Status of service:  Completed, signed off Medicare Important Message given?   (If response is "NO", the following Medicare IM given date fields will be blank) Date Medicare IM given:   Date Additional Medicare IM given:    Discharge Disposition:  HOME/SELF CARE  Per UR Regulation:  Reviewed for med. necessity/level of care/duration of stay  If discussed at Long Length of Stay Meetings, dates discussed:    Comments:  08-12-13 [address] Tomi Bamber[place], [place] [phone] CM assisted with 7 day supply for Tikosyn. No further needs at this time.   08-10-13 [address]Mitzie Na, [place] [phone] CM did call [facility] on [address] and tikosyn is available. CM will continue to monitor.

## 2013-08-10 NOTE — Progress Notes (Signed)
   SUBJECTIVE: The patient is doing well today.  At this time, she denies chest pain, shortness of breath, or any new concerns. Admitted 12-2 for Tikosyn loading - first dose received last night.   Labs pending this morning.   CURRENT MEDICATIONS: . Canagliflozin  1 tablet Oral Daily  . diltiazem  240 mg Oral QPM  . dofetilide  500 mcg Oral Q12H  . folic acid  1 mg Oral Daily  . furosemide  20 mg Oral Daily  . glipiZIDE  10 mg Oral Daily  . losartan  100 mg Oral Daily  . metFORMIN  1,000 mg Oral BID WC  . metoprolol succinate  50 mg Oral BID  . sertraline  100 mg Oral Daily  . sodium chloride  3 mL Intravenous Q12H  . vitamin B-12  50 mcg Oral Daily  . Warfarin - Pharmacist Dosing Inpatient   Does not apply q1800      OBJECTIVE: Physical Exam: Filed Vitals:   08/09/13 1545 08/09/13 2012 08/10/13 0555  BP: 148/88 143/77 149/88  Pulse: 117 104 60  Temp: 98.5 F (36.9 C)  97.9 F (36.6 C)  TempSrc: Oral  Oral  Resp: 18  18  Height: 5' 0.5" (1.537 m)    Weight: 200 lb (90.719 kg)    SpO2: 97% 98% 98%    Intake/Output Summary (Last 24 hours) at 08/10/13 3244 Last data filed at 08/10/13 0555  Gross per 24 hour  Intake      0 ml  Output    600 ml  Net   -600 ml    Telemetry reveals atrial fibrillation with controlled ventricular response.   GEN- The patient is well appearing, alert and oriented x 3 today.   Head- normocephalic, atraumatic Eyes-  Sclera clear, conjunctiva pink Ears- hearing intact Oropharynx- clear Neck- supple, no JVP Lymph- no cervical lymphadenopathy Lungs- Clear to ausculation bilaterally, normal work of breathing Heart- irregular rate and rhythm, no murmurs, rubs or gallops, PMI not laterally displaced GI- soft, NT, ND, + BS Extremities- no clubbing, cyanosis, or edema Skin- no rash or lesion Psych- euthymic mood, full affect Neuro- strength and sensation are intact  LABS: Basic Metabolic Panel:  Recent Labs  09/09/70 1242  NA 137    K 4.6  CL 105  CO2 25  GLUCOSE 119*  BUN 18  CREATININE 0.7  CALCIUM 9.8  MG 2.1   EKG 08-09-13 - atrial fibrillation, ventricular rate 106, QTc  ASSESSMENT AND PLAN:  Active Problems:   Atrial fibrillation  1. afib Doing well with tikosyn No changes today  2. HTN Stable No change required today  3. OSA Stable No change required today

## 2013-08-10 NOTE — Progress Notes (Signed)
UR Completed Nathon Stefanski Graves-Bigelow, RN,BSN [phone]  

## 2013-08-11 ENCOUNTER — Encounter (HOSPITAL_COMMUNITY): Payer: Self-pay | Admitting: Anesthesiology

## 2013-08-11 ENCOUNTER — Encounter (HOSPITAL_COMMUNITY)
Admission: AD | Disposition: A | Discharge status: Home / Self Care | Payer: Self-pay | Source: Ambulatory Visit | Attending: Internal Medicine

## 2013-08-11 DIAGNOSIS — Z5181 Encounter for therapeutic drug level monitoring: Secondary | ICD-10-CM

## 2013-08-11 LAB — GLUCOSE, CAPILLARY
Glucose-Capillary: 125 mg/dL — ABNORMAL HIGH (ref 70–99)
Glucose-Capillary: 104 mg/dL — ABNORMAL HIGH (ref 70–99)
Glucose-Capillary: 124 mg/dL — ABNORMAL HIGH (ref 70–99)

## 2013-08-11 LAB — BASIC METABOLIC PANEL
BUN: 19 mg/dL (ref 6–23)
CO2: 25 mEq/L (ref 19–32)
Calcium: 9.8 mg/dL (ref 8.4–10.5)
GFR calc Af Amer: >90 mL/min (ref >90)
GFR calc non Af Amer: 84 mL/min — ABNORMAL LOW (ref >90)
Glucose, Bld: 99 mg/dL (ref 70–99)
Potassium: 3.6 mEq/L (ref 3.5–5.1)
Sodium: 138 mEq/L (ref 135–145)

## 2013-08-11 LAB — PROTIME-INR: INR: 1.91 — ABNORMAL HIGH (ref 0.00–1.49)

## 2013-08-11 SURGERY — CARDIOVERSION
Anesthesia: Monitor Anesthesia Care

## 2013-08-11 MED ORDER — DOFETILIDE 250 MCG PO CAPS
250.0000 ug | ORAL_CAPSULE | Freq: Two times a day (BID) | ORAL | Status: DC
  Administered 2013-08-11 – 2013-08-12 (×2): 250 ug via ORAL
  Filled 2013-08-11 (×4): qty 1

## 2013-08-11 NOTE — Progress Notes (Signed)
Patient ID: Amy Wright, female   DOB: [birth year], 72 y.o.   MRN: [id number] Subjective:  Feels better in NSR  Objective:  Vital Signs in the last 24 hours: Temp:  [98 F (36.7 C)-98.7 F (37.1 C)] 98 F (36.7 C) (12/04 0544) Pulse Rate:  [65-68] 65 (12/04 1028) Resp:  [18-20] 20 (12/04 0544) BP: (124-138)/(64-77) 124/64 mmHg (12/04 1028) SpO2:  [97 %-98 %] 97 % (12/04 0544)  Intake/Output from previous day: 12/03 0701 - 12/04 0700 In: 240 [P.O.:240] Out: 350 [Urine:350] Intake/Output from this shift:    Physical Exam: Well appearing 72 yo woman, NAD HEENT: Unremarkable Neck:  No JVD, no thyromegally Back:  No CVA tenderness Lungs:  Clear with no wheezes, rales, or rhonchi HEART:  Regular rate rhythm, no murmurs, no rubs, no clicks Abd:  Flat, positive bowel sounds, no organomegally, no rebound, no guarding Ext:  2 plus pulses, no edema, no cyanosis, no clubbing Skin:  No rashes no nodules Neuro:  CN II through XII intact, motor grossly intact  Lab Results: No results found for this basename: WBC, HGB, PLT,  in the last 72 hours  Recent Labs  08/10/13 0457 08/11/13 0350  NA 143 138  K 3.7 3.6  CL 106 101  CO2 26 25  GLUCOSE 112* 99  BUN 16 19  CREATININE 0.64 0.74   No results found for this basename: TROPONINI, CK, MB,  in the last 72 hours Hepatic Function Panel No results found for this basename: PROT, ALBUMIN, AST, ALT, ALKPHOS, BILITOT, BILIDIR, IBILI,  in the last 72 hours No results found for this basename: CHOL,  in the last 72 hours No results found for this basename: PROTIME,  in the last 72 hours  Imaging: No results found.  Cardiac Studies: Tele - atrial fib now to NSR with a 5.5 second pause yesterday Assessment/Plan:  1. Atrial fibrillation 2. Sinus node dysfunction 3. S/p initiation of Tikosyn Rec: await today's ecg. Her ECG from yesterday suggests that the QT is too long. If long today, will reduce dose of Tiksoyn to 250 mcg twice  daily.  LOS: 2 days    Paullette Mckain,M.D. 08/11/2013, 1:00 PM

## 2013-08-11 NOTE — Anesthesia Preprocedure Evaluation (Deleted)
Anesthesia Evaluation   Patient awake    Reviewed: Allergy & Precautions, H&P , NPO status , Patient's Chart, lab work & pertinent test results  Airway       Dental   Pulmonary neg pulmonary ROS, shortness of breath, sleep apnea , resolved,          Cardiovascular hypertension, negative cardio ROS  Atrial Fibrillation Rhythm:irregular Rate:Abnormal     Neuro/Psych negative neurological ROS  negative psych ROS   GI/Hepatic negative GI ROS, Neg liver ROS,   Endo/Other  diabetes, Insulin Dependent  Renal/GU negative Renal ROS     Musculoskeletal   Abdominal   Peds  Hematology   Anesthesia Other Findings   Reproductive/Obstetrics negative OB ROS                           Anesthesia Physical Anesthesia Plan  ASA: III  Anesthesia Plan: General   Post-op Pain Management:    Induction:   Airway Management Planned:   Additional Equipment:   Intra-op Plan:   Post-operative Plan:   Informed Consent:   Plan Discussed with: Anesthesiologist, CRNA and Surgeon  Anesthesia Plan Comments:         Anesthesia Quick Evaluation

## 2013-08-12 DIAGNOSIS — E119 Type 2 diabetes mellitus without complications: Secondary | ICD-10-CM

## 2013-08-12 LAB — MAGNESIUM: Magnesium: 1.9 mg/dL (ref 1.5–2.5)

## 2013-08-12 LAB — BASIC METABOLIC PANEL
BUN: 20 mg/dL (ref 6–23)
CO2: 25 mEq/L (ref 19–32)
Calcium: 9.6 mg/dL (ref 8.4–10.5)
Creatinine, Ser: 0.71 mg/dL (ref 0.50–1.10)

## 2013-08-12 LAB — PROTIME-INR: Prothrombin Time: 22.2 seconds — ABNORMAL HIGH (ref 11.6–15.2)

## 2013-08-12 MED ORDER — POTASSIUM CHLORIDE CRYS ER 10 MEQ PO TBCR: 20.0000 meq | EXTENDED_RELEASE_TABLET | Freq: Every day | ORAL | Status: DC

## 2013-08-12 MED ORDER — DOFETILIDE 250 MCG PO CAPS: 250.0000 ug | ORAL_CAPSULE | Freq: Two times a day (BID) | ORAL | Status: DC

## 2013-08-12 MED ORDER — POTASSIUM CHLORIDE CRYS ER 20 MEQ PO TBCR
40.0000 meq | EXTENDED_RELEASE_TABLET | Freq: Once | ORAL | Status: AC
  Administered 2013-08-12: 40 meq via ORAL

## 2013-08-12 NOTE — Progress Notes (Signed)
Pt provided with dc isntructions and education. Pt verbalized understanding. Pt has no questions at this time. IV removed with tip intact. Heart monitor cleaned and returned to front. Pt waiting for tikosyn from pharmacy. Levonne Spiller, RN

## 2013-08-12 NOTE — Progress Notes (Signed)
   SUBJECTIVE: The patient is doing well today.  At this time, she denies chest pain, shortness of breath, or any new concerns. Admitted 12-2 for Tikosyn loading - converted to SR after 2nd dose with 5 second post termination pause.  She feels symptomatically improved in sinus rhythm.   QTc yesterday in sinus rhythm - Tikosyn dose reduced to twice daily  CURRENT MEDICATIONS: . Canagliflozin  1 tablet Oral Daily  . diltiazem  240 mg Oral QPM  . dofetilide  250 mcg Oral Q12H  . folic acid  1 mg Oral Daily  . furosemide  20 mg Oral Daily  . glipiZIDE  10 mg Oral Daily  . losartan  100 mg Oral Daily  . metFORMIN  1,000 mg Oral BID WC  . metoprolol succinate  50 mg Oral BID  . sertraline  100 mg Oral Daily  . sodium chloride  3 mL Intravenous Q12H  . sodium chloride  3 mL Intravenous Q12H  . vitamin B-12  50 mcg Oral Daily  . warfarin  2.5 mg Oral Custom  . warfarin  2.5 mg Oral Custom  . warfarin  5 mg Oral Custom  . Warfarin - Physician Dosing Inpatient   Does not apply q1800   . sodium chloride      OBJECTIVE: Physical Exam: Filed Vitals:   08/11/13 1028 08/11/13 1408 08/11/13 2100 08/12/13 0549  BP: 124/64 117/65 126/62 115/59  Pulse: 65 65 66 59  Temp:  98.4 F (36.9 C) 97.4 F (36.3 C) 97.4 F (36.3 C)  TempSrc:   Axillary Oral  Resp:  18    Height:      Weight:    199 lb 15.3 oz (90.7 kg)  SpO2:  98% 94% 98%    Intake/Output Summary (Last 24 hours) at 08/12/13 0701 Last data filed at 08/11/13 1300  Gross per 24 hour  Intake    600 ml  Output      0 ml  Net    600 ml    Telemetry reveals sinus rhythm   GEN- The patient is well appearing, alert and oriented x 3 today.   Head- normocephalic, atraumatic Eyes-  Sclera clear, conjunctiva pink Ears- hearing intact Oropharynx- clear Neck- supple, no JVP Lymph- no cervical lymphadenopathy Lungs- Clear to ausculation bilaterally, normal work of breathing Heart- irregular rate and rhythm, no  murmurs, rubs or gallops, PMI not laterally displaced GI- soft, NT, ND, + BS Extremities- no clubbing, cyanosis, or edema Skin- no rash or lesion Psych- euthymic mood, full affect Neuro- strength and sensation are intact  LABS: Basic Metabolic Panel:  Recent Labs  16/10/96 0350 08/12/13 0505  NA 138 138  K 3.6 3.8  CL 101 102  CO2 25 25  GLUCOSE 99 102*  BUN 19 20  CREATININE 0.74 0.71  CALCIUM 9.8 9.6  MG 1.9 1.9   EKG today reveals improved Qt, sinus rhythm  ASSESSMENT AND PLAN:  Active Problems:   Atrial fibrillation  1. afib Doing well with tikosyn No changes today  2. HTN Stable No change required today  3. OSA Stable No change required today  4. Post termination pause- she has never had symptomatic pauses before.  She does not wish to consider pacing at this time.  If she has further pauses in the future then pacing may be required.  She is aware  DC to home today Followup with labs and EKG in 1 week I will see in 4 weeks

## 2013-08-19 ENCOUNTER — Ambulatory Visit: Payer: Medicare Other | Admitting: Pharmacist

## 2013-08-19 NOTE — Discharge Summary (Signed)
ELECTROPHYSIOLOGY DISCHARGE SUMMARY    Patient ID: Amy Wright,  MRN: [id number], DOB/AGE: 12-Sep-1940 72 y.o.  Admit date: 08/09/2013 Discharge date: 08/19/2013  Primary Care Physician: Riley Nearing, MD Primary Cardiologist: Johney Frame, MD  Primary Discharge Diagnosis:  1. Symptomatic, persistent AFib s/p Tikosyn drug loading 2. Bradycardia s/p post termination pauses  Secondary Discharge Diagnoses:  1. HTN 2. OSA 3. DM 4. Dyslipidemia  Procedures This Admission:  None   History and Hospital Course:  Amy Wright is a 72 y.o. female with a h/o diabetes, hypertension, sleep apnea (on CPAP), and atrial fibrillation. She was first diagnosed with atrial fibrillation about 10 years ago and had relatively infrequent episodes (one every couple of years - these were self terminating and did not require cardioversion). She has had increasing frequency and duration of afib thereafter. She went into atrial fibrillation about 7 months ago and this has persisted. She was initially placed on propafenone 150mg  TID which failed to convert her to SR. She then had her propafenone dose increased to 225mg  TID and underwent cardioversion last month but had recurrence of atrial fibrillation after a couple of days. She is symptomatic with atrial fibrillation with fatigue and shortness of breath. She also had some bradycardia post cardioversion and her beta blocker was reduced. Last echo 03/2013 demonstrated an EF of 55-60%, no RWMA, mild MR, LA upper limits of normal (no measurement available). After discussion of options with Dr. Johney Frame, she elected to proceed with hospitalization for initiation of Tikosyn. She was admitted on 08/09/2013 and Tikosyn initiated per protocol. On 08/11/2013 she converted to SR with post-termination pause of 5 seconds. Per Dr Fawn Kirk notes, she was asymptomatic and did not want to consider PPM at this time. She also developed prolonged QT and her Tikosyn dose was decreased to 250 mcg every  12 hours. She remained in SR and hemodynamically stable. On 08/12/2013, she was seen, examined and deemed stable for discharge home by Dr. Hillis Range. She will return to clinic in one week for follow-up with Audrie Lia, PharmD. She will also have 12-lead ECG and BMET at that visit. She will follow-up with Dr. Johney Frame in 4 weeks.   Discharge Vitals: Blood pressure 120/60, pulse 68, temperature 98 F (36.7 C), temperature source Oral, resp. rate 18, height 5' 0.5" (1.537 m), weight 199 lb 15.3 oz (90.7 kg), SpO2 99.00%.   Labs: Lab Results  Component Value Date   WBC 7.9 06/20/2013   HGB 13.5 06/20/2013   HCT 40.7 06/20/2013   MCV 78.6 06/20/2013   PLT 424* 06/20/2013      Component Value Date/Time   NA 138 08/12/2013 0505   K 3.8 08/12/2013 0505   CL 102 08/12/2013 0505   CO2 25 08/12/2013 0505   GLUCOSE 102* 08/12/2013 0505   BUN 20 08/12/2013 0505   CREATININE 0.71 08/12/2013 0505   CREATININE 0.91 06/20/2013 1250   CALCIUM 9.6 08/12/2013 0505   GFRNONAA 85* 08/12/2013 0505   GFRAA >90 08/12/2013 0505   INR 2.02  Disposition:  The patient is being discharged in stable condition.  Follow-up: Follow-up Information   Follow up with [facility] On 08/19/2013. (At 2:00 PM for Tikosyn follow-up with Audrie Lia, PharmD (will have 12-lead ECG and BMET))    Specialty:  Cardiology   Contact information:   [address], [address] [place] [address] [phone]     Discharge Medications:    Medication List  diltiazem 240 MG 24 hr capsule  Commonly known as:  CARDIZEM CD  Take 240 mg by mouth every evening. 5 pm     dofetilide 250 MCG capsule  Commonly known as:  TIKOSYN  Take 1 capsule (250 mcg total) by mouth every 12 (twelve) hours.     ezetimibe 10 MG tablet  Commonly known as:  ZETIA  Take 10 mg by mouth at bedtime.     folic acid 1 MG tablet  Commonly known as:  FOLVITE  Take 1 mg by mouth daily.     furosemide 20 MG tablet    Commonly known as:  LASIX  Take 1 tablet (20 mg total) by mouth daily.     glipiZIDE 10 MG tablet  Commonly known as:  GLUCOTROL  Take 10 mg by mouth daily.     INVOKANA 300 MG Tabs  Generic drug:  Canagliflozin  Take 1 tablet by mouth daily.     LORazepam 0.5 MG tablet  Commonly known as:  ATIVAN  Take 0.5 mg by mouth daily as needed for anxiety.     losartan 100 MG tablet  Commonly known as:  COZAAR  Take 1 tablet (100 mg total) by mouth daily.     Melatonin 3 MG Tabs  Take 1 tablet by mouth at bedtime.     metFORMIN 1000 MG tablet  Commonly known as:  GLUCOPHAGE  Take 1,000 mg by mouth 2 (two) times daily with a meal.     metoprolol succinate 50 MG 24 hr tablet  Commonly known as:  TOPROL-XL  Take 1 tablet (50 mg total) by mouth 2 (two) times daily.     omega-3 acid ethyl esters 1 G capsule  Commonly known as:  LOVAZA  Take 1 g by mouth 2 (two) times daily.     potassium chloride 10 MEQ tablet  Commonly known as:  KLOR-CON M10  Take 2 tablets (20 mEq total) by mouth daily.     sertraline 100 MG tablet  Commonly known as:  ZOLOFT  Take 100 mg by mouth daily. 3 pm     TRILIPIX 135 MG capsule  Generic drug:  Choline Fenofibrate  Take 135 mg by mouth daily at 6 PM.     vitamin B-12 100 MCG tablet  Commonly known as:  CYANOCOBALAMIN  Take 50 mcg by mouth daily. Pt is currently not taking this everyday     vitamin C 1000 MG tablet  Take 3,000 mg by mouth daily.     VITAMIN D-3 PO  Take 1,000 mg by mouth daily.     warfarin 2.5 MG tablet  Commonly known as:  COUMADIN  Take 2.5-5 mg by mouth See admin instructions. Patient takes 5 mg for one day then 2.5 mg for two days then repeat.  Patient took 2.5 mg on 12/1.     zolpidem 12.5 MG CR tablet  Commonly known as:  AMBIEN CR  Take 12.5 mg by mouth at bedtime as needed for sleep.       Duration of Discharge Encounter: Greater than 30 minutes including physician time.  Limmie Patricia,  PA-C 08/19/2013, 10:37 AM   Hillis Range MD

## 2013-08-22 ENCOUNTER — Ambulatory Visit (INDEPENDENT_AMBULATORY_CARE_PROVIDER_SITE_OTHER): Payer: Medicare Other | Admitting: Pharmacist

## 2013-08-22 DIAGNOSIS — I4891 Unspecified atrial fibrillation: Secondary | ICD-10-CM

## 2013-08-22 DIAGNOSIS — I4819 Other persistent atrial fibrillation: Secondary | ICD-10-CM

## 2013-08-22 LAB — BASIC METABOLIC PANEL
BUN: 23 mg/dL (ref 6–23)
Calcium: 9.9 mg/dL (ref 8.4–10.5)
Chloride: 102 mEq/L (ref 96–112)
Creatinine, Ser: 0.8 mg/dL (ref 0.4–1.2)
GFR: 74.17 mL/min (ref >60.00)

## 2013-08-22 LAB — MAGNESIUM: Magnesium: 2 mg/dL (ref 1.5–2.5)

## 2013-08-23 NOTE — Progress Notes (Signed)
HPI Amy Wright is a 72 y.o. female with a h/o diabetes, hypertension, sleep apnea (on CPAP), and atrial fibrillation.  She was recently admitted from 12/2-12/5 for Tikosyn initiation.  She converted to sinus rhythm on 12/4 but also had a prolonged QTc so her dose was decreased to BID.  She did well on this dose and was discharge home on 12/5.   Since discharge, pt states she feels like a different person.  She has an increase in energy and even put up some Christmas lights last week.  She has been compliant with her doses and not missed any.  She has already received her supply from the local pharmacy with no problems.    Labs at discharge: K- 3.8, Mg- 1.9, INR 2.02  EKG today reviewed by Dr. Johney Frame shows sinus rhythm with 1st degree AV block.  Vent Rate 63 bpm and QTc 458.     Current Outpatient Prescriptions  Medication Sig Dispense Refill  . Ascorbic Acid (VITAMIN C) 1000 MG tablet Take 3,000 mg by mouth daily.       . Canagliflozin (INVOKANA) 300 MG TABS Take 1 tablet by mouth daily.      . Cholecalciferol (VITAMIN D-3 PO) Take 1,000 mg by mouth daily.       . Choline Fenofibrate (TRILIPIX) 135 MG capsule Take 135 mg by mouth daily at 6 PM.       . diltiazem (CARDIZEM CD) 240 MG 24 hr capsule Take 240 mg by mouth every evening. 5 pm      . dofetilide (TIKOSYN) 250 MCG capsule Take 1 capsule (250 mcg total) by mouth every 12 (twelve) hours.  60 capsule  4  . ezetimibe (ZETIA) 10 MG tablet Take 10 mg by mouth at bedtime.       . folic acid (FOLVITE) 1 MG tablet Take 1 mg by mouth daily.      . furosemide (LASIX) 20 MG tablet Take 1 tablet (20 mg total) by mouth daily.  30 tablet  3  . glipiZIDE (GLUCOTROL) 10 MG tablet Take 10 mg by mouth daily.      Marland Kitchen LORazepam (ATIVAN) 0.5 MG tablet Take 0.5 mg by mouth daily as needed for anxiety.      Marland Kitchen losartan (COZAAR) 100 MG tablet Take 1 tablet (100 mg total) by mouth daily.  30 tablet  3  . Melatonin 3 MG TABS Take 1 tablet by mouth  at bedtime.      . metFORMIN (GLUCOPHAGE) 1000 MG tablet Take 1,000 mg by mouth 2 (two) times daily with a meal.      . metoprolol succinate (TOPROL-XL) 50 MG 24 hr tablet Take 1 tablet (50 mg total) by mouth 2 (two) times daily.  60 tablet  11  . omega-3 acid ethyl esters (LOVAZA) 1 G capsule Take 1 g by mouth 2 (two) times daily.      . potassium chloride (KLOR-CON M10) 10 MEQ tablet Take 2 tablets (20 mEq total) by mouth daily.  60 tablet  4  . sertraline (ZOLOFT) 100 MG tablet Take 100 mg by mouth daily. 3 pm      . vitamin B-12 (CYANOCOBALAMIN) 100 MCG tablet Take 50 mcg by mouth daily. Pt is currently not taking this everyday      . warfarin (COUMADIN) 2.5 MG tablet Take 2.5-5 mg by mouth See admin instructions. Patient takes 5 mg for one day then 2.5 mg for two days then repeat.  Patient took 2.5 mg on  12/1.      . zolpidem (AMBIEN CR) 12.5 MG CR tablet Take 12.5 mg by mouth at bedtime as needed for sleep.       No current facility-administered medications for this visit.    Allergies  Allergen Reactions  . Statins     myalgias

## 2013-08-24 NOTE — Assessment & Plan Note (Signed)
Checked pt's labs today.  K- 3.8, Mg 2.0.  Pt was placed on of K daily at hospital discharge.  Asked pt to be consistent with taking this and increase potassium rich foods.  QTc stable since discharge.  She has a 4 week appt to follow up with Dr. Johney Frame in January.

## 2013-08-26 ENCOUNTER — Ambulatory Visit: Payer: Medicare Other | Admitting: Pharmacist

## 2013-08-30 ENCOUNTER — Telehealth: Payer: Self-pay | Admitting: Internal Medicine

## 2013-08-30 ENCOUNTER — Other Ambulatory Visit: Payer: Self-pay | Admitting: Internal Medicine

## 2013-08-30 NOTE — Telephone Encounter (Signed)
New message    Refill coumadin 5mg  at [facility]  [phone]

## 2013-08-30 NOTE — Telephone Encounter (Signed)
This patient has their inr checked at [facility] so I was not sure who was supposed to fill it. Thanks, MI

## 2013-08-30 NOTE — Telephone Encounter (Signed)
We don't monitor INRs on this patient, her PCP does.  Please route to them

## 2013-08-30 NOTE — Telephone Encounter (Signed)
Telephoned pt and informed her to call PCP who manages coumadin for refill.

## 2013-09-22 ENCOUNTER — Ambulatory Visit: Payer: Medicare Other | Admitting: Internal Medicine

## 2013-09-22 ENCOUNTER — Ambulatory Visit (INDEPENDENT_AMBULATORY_CARE_PROVIDER_SITE_OTHER): Payer: Medicare Other | Admitting: Internal Medicine

## 2013-09-22 ENCOUNTER — Encounter: Payer: Self-pay | Admitting: Internal Medicine

## 2013-09-22 VITALS — BP 178/86 | HR 66 | Ht 65.0 in | Wt 208.0 lb

## 2013-09-22 DIAGNOSIS — G4733 Obstructive sleep apnea (adult) (pediatric): Secondary | ICD-10-CM

## 2013-09-22 DIAGNOSIS — I10 Essential (primary) hypertension: Secondary | ICD-10-CM

## 2013-09-22 DIAGNOSIS — R0789 Other chest pain: Secondary | ICD-10-CM | POA: Insufficient documentation

## 2013-09-22 DIAGNOSIS — Z7901 Long term (current) use of anticoagulants: Secondary | ICD-10-CM

## 2013-09-22 DIAGNOSIS — R0602 Shortness of breath: Secondary | ICD-10-CM | POA: Insufficient documentation

## 2013-09-22 DIAGNOSIS — Z5181 Encounter for therapeutic drug level monitoring: Secondary | ICD-10-CM

## 2013-09-22 DIAGNOSIS — I4819 Other persistent atrial fibrillation: Secondary | ICD-10-CM

## 2013-09-22 DIAGNOSIS — I4891 Unspecified atrial fibrillation: Secondary | ICD-10-CM

## 2013-09-22 LAB — BASIC METABOLIC PANEL
BUN: 25 mg/dL — ABNORMAL HIGH (ref 6–23)
CHLORIDE: 100 meq/L (ref 96–112)
CO2: 28 mEq/L (ref 19–32)
Calcium: 10.5 mg/dL (ref 8.4–10.5)
Creatinine, Ser: 0.8 mg/dL (ref 0.4–1.2)
GFR: 71.1 mL/min (ref >60.00)
GLUCOSE: 131 mg/dL — AB (ref 70–99)
POTASSIUM: 3.8 meq/L (ref 3.5–5.1)
Sodium: 137 mEq/L (ref 135–145)

## 2013-09-22 LAB — MAGNESIUM: Magnesium: 2 mg/dL (ref 1.5–2.5)

## 2013-09-22 NOTE — Patient Instructions (Signed)
Your physician recommends that you schedule a follow-up appointment in: 3 months with Dr Johney FrameAllred   Your physician has requested that you have en exercise stress myoview. For further information please visit [email or web address]. Please follow instruction sheet, as given.   Your physician recommends that you return for lab work today:  BMP/MAG

## 2013-09-22 NOTE — Progress Notes (Signed)
PCP:  Angelica Chessman., MD Primary Cardiologist:  Dr Tresa Endo  The patient presents today for routine electrophysiology followup.  Since starting tikosyn, the patient reports doing very well.  Her energy is much improved in sinus.  She is very happy with sinus rhythm.  She has had some chest discomfort and SOB. Today, she denies symptoms of palpitations,orthopnea, PND, lower extremity edema, dizziness, presyncope, syncope, or neurologic sequela.  The patient feels that she is tolerating medications without difficulties and is otherwise without complaint today.   Past Medical History  Diagnosis Date  . Persistent atrial fibrillation   . Hyperlipidemia   . Hypertriglyceridemia   . 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 Central jet. Aortic Valve appears to be mildly sclerotic. Trace aortic regurgitation.EF >55%  . Hypertension   . Obesity   . Enlarged heart   . Heart murmur   . Walking pneumonia   . OSA on CPAP   . Exertional shortness of breath   . Type II diabetes mellitus   . Anxiety   . Chronic insomnia    Past Surgical History  Procedure Laterality Date  . Cesarean section  1971  . Cardioversion N/A 06/23/2013    Procedure: CARDIOVERSION;  Surgeon: Lennette Bihari, MD;  Location: [facility] ENDOSCOPY;  Service: Cardiovascular;  Laterality: N/A;  . Appendectomy  1971  . Placement of breast implants Bilateral 1985  . Cataract extraction w/ intraocular lens implant Bilateral     Current Outpatient Prescriptions  Medication Sig Dispense Refill  . Ascorbic Acid (VITAMIN C) 1000 MG tablet Take 3,000 mg by mouth daily.       . Canagliflozin (INVOKANA) 300 MG TABS Take 1 tablet by mouth daily.      . Cholecalciferol (VITAMIN D-3 PO) Take 1,000 mg by mouth daily.       . Choline Fenofibrate (TRILIPIX) 135 MG capsule Take 135 mg by mouth daily at 6 PM.       . diltiazem (CARDIZEM CD) 240 MG 24 hr capsule Take 240 mg by mouth every evening. 5  pm      . dofetilide (TIKOSYN) 250 MCG capsule Take 1 capsule (250 mcg total) by mouth every 12 (twelve) hours.  60 capsule  4  . ezetimibe (ZETIA) 10 MG tablet Take 10 mg by mouth at bedtime.       . folic acid (FOLVITE) 1 MG tablet Take 1 mg by mouth daily.      . furosemide (LASIX) 20 MG tablet Take 1 tablet (20 mg total) by mouth daily.  30 tablet  3  . glipiZIDE (GLUCOTROL) 10 MG tablet Take 10 mg by mouth daily.      Marland Kitchen LORazepam (ATIVAN) 0.5 MG tablet Take 0.5 mg by mouth daily as needed for anxiety.      Marland Kitchen losartan (COZAAR) 100 MG tablet Take 1 tablet (100 mg total) by mouth daily.  30 tablet  3  . Melatonin 3 MG TABS Take 1 tablet by mouth at bedtime.      . metFORMIN (GLUCOPHAGE) 1000 MG tablet Take 1,000 mg by mouth 2 (two) times daily with a meal.      . metoprolol succinate (TOPROL-XL) 50 MG 24 hr tablet Take 1 tablet (50 mg total) by mouth 2 (two) times daily.  60 tablet  11  . omega-3 acid ethyl esters (LOVAZA) 1 G capsule Take 1 g by mouth 2 (two) times daily.      . potassium chloride (  KLOR-CON M10) 10 MEQ tablet Take 2 tablets (20 mEq total) by mouth daily.  60 tablet  4  . sertraline (ZOLOFT) 100 MG tablet Take 100 mg by mouth daily. 3 pm      . vitamin B-12 (CYANOCOBALAMIN) 100 MCG tablet Take 50 mcg by mouth daily. Pt is currently not taking this everyday      . warfarin (COUMADIN) 2.5 MG tablet Take 2.5-5 mg by mouth See admin instructions. Patient takes 5 mg for one day then 2.5 mg for two days then repeat.  Patient took 2.5 mg on 12/1.      Marland Kitchen. zolpidem (AMBIEN CR) 12.5 MG CR tablet Take 12.5 mg by mouth at bedtime as needed for sleep.       No current facility-administered medications for this visit.    Allergies  Allergen Reactions  . Statins     myalgias    History   Social History  . Marital Status: Married    Spouse Name: N/A    Number of Children: N/A  . Years of Education: N/A   Occupational History  . Not on file.   Social History Main Topics  .  Smoking status: Passive Smoke Exposure - Never Smoker    Types: Cigarettes  . Smokeless tobacco: Never Used  . Alcohol Use: Yes     Comment: 08/09/2013 "nothing to drink in >4 yrs; never had a problem w/it"  . Drug Use: No  . Sexual Activity: Not Currently   Other Topics Concern  . Not on file   Social History Narrative  . No narrative on file    Family History  Problem Relation Age of Onset  . Heart attack Mother   . Cardiomyopathy Mother   . Cancer - Lung Father   . Cancer - Other Maternal Grandmother   . Diabetes Maternal Grandfather   . Stroke Paternal Grandfather     ROS-  All systems are reviewed and are negative except as outlined in the HPI above  Physical Exam: Filed Vitals:   09/22/13 1236  BP: 178/86  Pulse: 66  Height: 5\' 5"  (1.651 m)  Weight: 208 lb (94.348 kg)    GEN- The patient is well appearing, alert and oriented x 3 today.   Head- normocephalic, atraumatic Eyes-  Sclera clear, conjunctiva pink Ears- hearing intact Oropharynx- clear Neck- supple, no JVP Lymph- no cervical lymphadenopathy Lungs- Clear to ausculation bilaterally, normal work of breathing Heart- Regular rate and rhythm, no murmurs, rubs or gallops, PMI not laterally displaced GI- soft, NT, ND, + BS Extremities- no clubbing, cyanosis, or edema Neuro- strength and sensation are intact  ekg today reveals sinus rhythm 66 bpm, PR 228, nonspecific ST/T changes  Assessment and Plan:   Assessment and Plan: 1.  Atrial fibrillation Tolerating tikosyn and feels much better in sinus Check bmet and mg Continue coumadin long term (CHADS2VASC is at least 4)  2.  Sleep apnea Compliance with CPAP is encouraged  3.  Hypertension Stable No change required today  4. Chest discomfort and SOB Exercise myoview is ordered.  IF she does not achieve target heart rate then she will need to be converted to lexiscan   Return to see me in 3 months

## 2013-10-05 ENCOUNTER — Encounter (HOSPITAL_COMMUNITY): Payer: Medicare Other

## 2013-11-03 ENCOUNTER — Ambulatory Visit: Payer: Medicare Other | Admitting: Cardiovascular Disease

## 2013-11-04 ENCOUNTER — Ambulatory Visit: Payer: Medicare Other | Admitting: Internal Medicine

## 2013-12-05 ENCOUNTER — Other Ambulatory Visit: Payer: Self-pay | Admitting: Internal Medicine

## 2013-12-07 ENCOUNTER — Encounter (HOSPITAL_COMMUNITY): Payer: Self-pay | Admitting: Internal Medicine

## 2013-12-13 ENCOUNTER — Ambulatory Visit: Payer: Medicare Other | Admitting: Cardiovascular Disease

## 2014-01-10 ENCOUNTER — Other Ambulatory Visit (HOSPITAL_COMMUNITY): Payer: Self-pay | Admitting: Cardiology

## 2014-02-02 ENCOUNTER — Encounter: Payer: Medicare Other | Admitting: Cardiology

## 2014-02-06 NOTE — Progress Notes (Signed)
This encounter was created in error - please disregard.  Patient canceled appointment. 

## 2014-02-13 ENCOUNTER — Other Ambulatory Visit (HOSPITAL_COMMUNITY): Payer: Self-pay | Admitting: Internal Medicine

## 2014-02-13 ENCOUNTER — Other Ambulatory Visit: Payer: Self-pay

## 2014-02-13 MED ORDER — DOFETILIDE 250 MCG PO CAPS: ORAL_CAPSULE | ORAL | Status: DC

## 2014-02-21 ENCOUNTER — Ambulatory Visit: Payer: Medicare Other | Admitting: Cardiology

## 2014-02-28 ENCOUNTER — Ambulatory Visit: Payer: Medicare Other | Admitting: Cardiology

## 2014-03-13 ENCOUNTER — Other Ambulatory Visit: Payer: Self-pay | Admitting: *Deleted

## 2014-03-13 ENCOUNTER — Other Ambulatory Visit: Payer: Self-pay | Admitting: Internal Medicine

## 2014-03-13 ENCOUNTER — Telehealth: Payer: Self-pay | Admitting: Internal Medicine

## 2014-03-13 ENCOUNTER — Telehealth: Payer: Self-pay | Admitting: *Deleted

## 2014-03-13 MED ORDER — DOFETILIDE 250 MCG PO CAPS: ORAL_CAPSULE | ORAL | Status: DC

## 2014-03-13 NOTE — Telephone Encounter (Signed)
New problem    Pt want to know does she need to come off coumadin to have a tooth pulled. Please advise pt.

## 2014-03-13 NOTE — Telephone Encounter (Signed)
Patient would like to know if ok to use nitrous oxide at he upcoming dental appointment. Please advise. Thanks, MI

## 2014-03-15 NOTE — Telephone Encounter (Signed)
Routing to Michalene, RN, who is covering Dr. Allred today, to address with him. 

## 2014-03-15 NOTE — Telephone Encounter (Signed)
Spoke with patient. She has appointment 7/20 with Dr. Johney FrameAllred. She has no dental appointment yet. She will discuss at upcoming visit with Dr. Johney FrameAllred  1) holding coumadin for the extraction of broken tooth and   2) using nitrous oxide

## 2014-03-15 NOTE — Telephone Encounter (Signed)
Routing to Mill SpringMichalene, Charity fundraiserN, who is covering Dr. Johney FrameAllred today, to address with him.

## 2014-03-22 DIAGNOSIS — E785 Hyperlipidemia, unspecified: Secondary | ICD-10-CM | POA: Insufficient documentation

## 2014-03-22 DIAGNOSIS — F329 Major depressive disorder, single episode, unspecified: Secondary | ICD-10-CM | POA: Insufficient documentation

## 2014-03-22 DIAGNOSIS — G4700 Insomnia, unspecified: Secondary | ICD-10-CM | POA: Insufficient documentation

## 2014-03-22 DIAGNOSIS — F32A Depression, unspecified: Secondary | ICD-10-CM | POA: Insufficient documentation

## 2014-03-22 DIAGNOSIS — N760 Acute vaginitis: Secondary | ICD-10-CM | POA: Insufficient documentation

## 2014-03-22 DIAGNOSIS — E781 Pure hyperglyceridemia: Secondary | ICD-10-CM | POA: Insufficient documentation

## 2014-03-27 ENCOUNTER — Ambulatory Visit (INDEPENDENT_AMBULATORY_CARE_PROVIDER_SITE_OTHER): Payer: Medicare Other | Admitting: Internal Medicine

## 2014-03-27 ENCOUNTER — Encounter: Payer: Self-pay | Admitting: Internal Medicine

## 2014-03-27 VITALS — BP 138/84 | HR 67 | Ht 65.5 in | Wt 218.8 lb

## 2014-03-27 DIAGNOSIS — I4891 Unspecified atrial fibrillation: Secondary | ICD-10-CM

## 2014-03-27 DIAGNOSIS — G4733 Obstructive sleep apnea (adult) (pediatric): Secondary | ICD-10-CM

## 2014-03-27 DIAGNOSIS — I4819 Other persistent atrial fibrillation: Secondary | ICD-10-CM

## 2014-03-27 DIAGNOSIS — Z5181 Encounter for therapeutic drug level monitoring: Secondary | ICD-10-CM

## 2014-03-27 DIAGNOSIS — I10 Essential (primary) hypertension: Secondary | ICD-10-CM

## 2014-03-27 DIAGNOSIS — Z7901 Long term (current) use of anticoagulants: Secondary | ICD-10-CM

## 2014-03-27 NOTE — Progress Notes (Signed)
PCP:  Angelica ChessmanAGUIAR,RAFAELA M., MD Primary Cardiologist:  Dr Tresa EndoKelly  The patient presents today for routine electrophysiology followup.  Since starting tikosyn, the patient reports doing very well.  Her energy is much improved in sinus.  She is doing well with the warfarin without any issues with bleeding. Has not noticed any atrial fibrillation. She did have labs drawn with her primary last week and will obtain these to make sure she had a metb and magnesium done. If not, patient is aware , will need additional labs drawn. EKG today shows sinus rhythm QTC were 462 ms. He is using her CPAP and trying to lose weight by starting a new exercise routine.  Past Medical History  Diagnosis Date  . Persistent atrial fibrillation   . Hyperlipidemia   . Hypertriglyceridemia   . 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 Central jet. Aortic Valve appears to be mildly sclerotic. Trace aortic regurgitation.EF >55%  . Hypertension   . Obesity   . Enlarged heart   . Heart murmur   . Walking pneumonia   . OSA on CPAP   . Exertional shortness of breath   . Type II diabetes mellitus   . Anxiety   . Chronic insomnia    Past Surgical History  Procedure Laterality Date  . Cesarean section  1971  . Cardioversion N/A 06/23/2013    Procedure: CARDIOVERSION;  Surgeon: Lennette Biharihomas A Kelly, MD;  [facility];  Service: Cardiovascular;  Laterality: N/A;  . Appendectomy  1971  . Placement of breast implants Bilateral 1985  . Cataract extraction w/ intraocular lens implant Bilateral     Current Outpatient Prescriptions  Medication Sig Dispense Refill  . Ascorbic Acid (VITAMIN C) 1000 MG tablet Take 3,000 mg by mouth daily.       . Canagliflozin (INVOKANA) 300 MG TABS Take 1 tablet by mouth daily.      . Cholecalciferol (VITAMIN D-3 PO) Take 1,000 mg by mouth daily.       . Choline Fenofibrate (TRILIPIX) 135 MG capsule Take 135 mg by mouth daily at 6 PM.         . diltiazem (CARDIZEM CD) 240 MG 24 hr capsule Take 240 mg by mouth every evening. 5 pm      . dofetilide (TIKOSYN) 250 MCG capsule TAKE 1 CAPSULE (250 MCG TOTAL) BY MOUTH EVERY 12 (TWELVE) HOURS.  60 capsule  0  . ezetimibe (ZETIA) 10 MG tablet Take 10 mg by mouth at bedtime.       . fluconazole (DIFLUCAN) 150 MG tablet Take 1 tablet by mouth daily.      . folic acid (FOLVITE) 1 MG tablet Take 1 mg by mouth daily.      Marland Kitchen. glipiZIDE (GLUCOTROL) 10 MG tablet Take 10 mg by mouth daily.      Marland Kitchen. linagliptin (TRADJENTA) 5 MG TABS tablet Take 5 mg by mouth daily.      Marland Kitchen. LORazepam (ATIVAN) 0.5 MG tablet Take 0.5 mg by mouth daily as needed for anxiety.      Marland Kitchen. losartan (COZAAR) 100 MG tablet TAKE 1 TABLET (100 MG TOTAL) BY MOUTH DAILY.  30 tablet  0  . Melatonin 3 MG TABS Take 1 tablet by mouth at bedtime.      . metFORMIN (GLUCOPHAGE) 1000 MG tablet Take 1,000 mg by mouth 2 (two) times daily with a meal.      . metoprolol succinate (TOPROL-XL) 50 MG 24 hr tablet  Take 1 tablet (50 mg total) by mouth 2 (two) times daily.  60 tablet  11  . omega-3 acid ethyl esters (LOVAZA) 1 G capsule Take 1 g by mouth 2 (two) times daily.      . potassium chloride (KLOR-CON M10) 10 MEQ tablet Take 2 tablets (20 mEq total) by mouth daily.  60 tablet  4  . sertraline (ZOLOFT) 100 MG tablet Take 100 mg by mouth daily. 3 pm      . vitamin B-12 (CYANOCOBALAMIN) 100 MCG tablet Take 50 mcg by mouth daily. Pt is currently not taking this everyday      . warfarin (COUMADIN) 2.5 MG tablet Take 2.5-5 mg by mouth See admin instructions.       Marland Kitchen zolpidem (AMBIEN CR) 12.5 MG CR tablet Take 12.5 mg by mouth at bedtime as needed for sleep.       No current facility-administered medications for this visit.    Allergies  Allergen Reactions  . Statins     myalgias    History   Social History  . Marital Status: Married    Spouse Name: N/A    Number of Children: N/A  . Years of Education: N/A   Occupational History  . Not on  file.   Social History Main Topics  . Smoking status: Passive Smoke Exposure - Never Smoker    Types: Cigarettes  . Smokeless tobacco: Never Used  . Alcohol Use: Yes     Comment: 08/09/2013 "nothing to drink in >4 yrs; never had a problem w/it"  . Drug Use: No  . Sexual Activity: Not Currently   Other Topics Concern  . Not on file   Social History Narrative  . No narrative on file    Family History  Problem Relation Age of Onset  . Heart attack Mother   . Cardiomyopathy Mother   . Cancer - Lung Father   . Cancer - Other Maternal Grandmother   . Diabetes Maternal Grandfather   . Stroke Paternal Grandfather     ROS-  All systems are reviewed and are negative except as outlined in the HPI above  Physical Exam: Filed Vitals:   03/27/14 1547  BP: 138/84  Pulse: 67  Height: 5' 5.5" (1.664 m)  Weight: 99.247 kg (218 lb 12.8 oz)    GEN- The patient is well appearing, alert and oriented x 3 today.   Head- normocephalic, atraumatic Eyes-  Sclera clear, conjunctiva pink Ears- hearing intact Oropharynx- clear Neck- supple, no JVP Lymph- no cervical lymphadenopathy Lungs- Clear to ausculation bilaterally, normal work of breathing Heart- Regular rate and rhythm, no murmurs, rubs or gallops, PMI not laterally displaced GI- soft, NT, ND, + BS Extremities- no clubbing, cyanosis, or edema Neuro- strength and sensation are intact  ekg today reveals sinus rhythm  67 beats per minutes with first degree AV block QTC 462 ms   Assessment and Plan: 1.  Atrial fibrillation Tolerating tikosyn and feels much better in sinus Check bmet and mg, see if drawn at PCPs office last week if not she will require these to be drawn. Continue coumadin long term (CHADS2VASC is at least 4)  2.  Sleep apnea Using CPAP on a regular basis.  3.  Hypertension Stable No change required today  4. Obesity with BMI of 35.84 Patient is aware and is starting an exercise program and trying to modify  diet.  Return to see Rudi Coco in 4 months in the afib clinic

## 2014-03-27 NOTE — Patient Instructions (Signed)
Your physician recommends that you schedule a follow-up appointment in: 4 months with Rudi Cocoonna Carroll, NP

## 2014-04-13 ENCOUNTER — Other Ambulatory Visit: Payer: Self-pay

## 2014-04-13 ENCOUNTER — Other Ambulatory Visit: Payer: Self-pay | Admitting: Internal Medicine

## 2014-04-13 MED ORDER — DOFETILIDE 250 MCG PO CAPS: ORAL_CAPSULE | ORAL | Status: DC

## 2014-06-13 DIAGNOSIS — R350 Frequency of micturition: Secondary | ICD-10-CM | POA: Insufficient documentation

## 2014-06-13 DIAGNOSIS — N390 Urinary tract infection, site not specified: Secondary | ICD-10-CM | POA: Insufficient documentation

## 2014-08-06 ENCOUNTER — Other Ambulatory Visit: Payer: Self-pay | Admitting: Internal Medicine

## 2014-08-11 ENCOUNTER — Other Ambulatory Visit: Payer: Self-pay

## 2014-08-11 MED ORDER — DOFETILIDE 250 MCG PO CAPS: ORAL_CAPSULE | ORAL | Status: DC

## 2014-09-11 ENCOUNTER — Other Ambulatory Visit: Payer: Self-pay | Admitting: *Deleted

## 2014-09-11 MED ORDER — DOFETILIDE 250 MCG PO CAPS: ORAL_CAPSULE | ORAL | Status: DC

## 2014-09-19 ENCOUNTER — Ambulatory Visit (HOSPITAL_COMMUNITY): Payer: Medicare Other | Admitting: Nurse Practitioner

## 2014-09-21 ENCOUNTER — Ambulatory Visit: Payer: Medicare Other | Admitting: Nurse Practitioner

## 2014-09-21 ENCOUNTER — Encounter (HOSPITAL_COMMUNITY): Payer: Self-pay | Admitting: Cardiology

## 2014-09-27 ENCOUNTER — Ambulatory Visit: Payer: Medicare Other | Admitting: Nurse Practitioner

## 2014-11-06 LAB — PROTIME-INR: INR: 3.1 — AB (ref 0.9–1.1)

## 2014-11-07 ENCOUNTER — Ambulatory Visit (INDEPENDENT_AMBULATORY_CARE_PROVIDER_SITE_OTHER): Payer: Medicare Other | Admitting: Nurse Practitioner

## 2014-11-07 ENCOUNTER — Encounter: Payer: Self-pay | Admitting: Nurse Practitioner

## 2014-11-07 VITALS — BP 150/88 | HR 69 | Ht 65.0 in | Wt 227.0 lb

## 2014-11-07 DIAGNOSIS — I4819 Other persistent atrial fibrillation: Secondary | ICD-10-CM

## 2014-11-07 DIAGNOSIS — I4891 Unspecified atrial fibrillation: Secondary | ICD-10-CM

## 2014-11-07 HISTORY — DX: Other persistent atrial fibrillation: I48.19

## 2014-11-07 LAB — MAGNESIUM: Magnesium: 1.7 mg/dL (ref 1.5–2.5)

## 2014-11-07 MED ORDER — WARFARIN SODIUM 2.5 MG PO TABS: 2.5000 mg | ORAL_TABLET | ORAL | Status: DC

## 2014-11-07 MED ORDER — DOFETILIDE 250 MCG PO CAPS: ORAL_CAPSULE | ORAL | Status: DC

## 2014-11-07 NOTE — Patient Instructions (Signed)
Your physician recommends that you return for lab work today  Follow up with Dr. Johney FrameAllred as scheduled

## 2014-11-07 NOTE — Progress Notes (Signed)
PCP:  Angelica ChessmanAGUIAR,RAFAELA M., MD Primary Cardiologist:  Dr Tresa EndoKelly  The patient presents today for routine electrophysiology followup.  Since starting tikosyn, the patient reports doing very well.  Her energy is much improved in sinus.  She is doing well with the warfarin without any issues with bleeding. Has not noticed any atrial fibrillation. She did have labs drawn with her primary  yesterday. Reviewed and show a normal bmet but will need magnesium drawn today.  Also noted that her LDL is at goal at 55 but trigs elevated at 333. Discussed with pt to avoid simple sugars and white foods to decrease this valve. She also has plans to increase her walking now that weather is warmer to encourage wt loss.  Past Medical History  Diagnosis Date  . Persistent atrial fibrillation   . Hyperlipidemia   . Hypertriglyceridemia   . 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 Central jet. Aortic Valve appears to be mildly sclerotic. Trace aortic regurgitation.EF >55%  . Hypertension   . Obesity   . Enlarged heart   . Heart murmur   . Walking pneumonia   . OSA on CPAP   . Exertional shortness of breath   . Type II diabetes mellitus   . Anxiety   . Chronic insomnia    Past Surgical History  Procedure Laterality Date  . Cesarean section  1971  . Cardioversion N/A 06/23/2013    Procedure: CARDIOVERSION;  Surgeon: Lennette Biharihomas A Kelly, MD;  [facility];  Service: Cardiovascular;  Laterality: N/A;  . Appendectomy  1971  . Placement of breast implants Bilateral 1985  . Cataract extraction w/ intraocular lens implant Bilateral     Current Outpatient Prescriptions  Medication Sig Dispense Refill  . Ascorbic Acid (VITAMIN C) 1000 MG tablet Take 3,000 mg by mouth daily.     . Cholecalciferol (VITAMIN D-3 PO) Take 1,000 mg by mouth daily.     . Choline Fenofibrate (TRILIPIX) 135 MG capsule Take 135 mg by mouth daily at 6 PM.     . diltiazem (CARDIZEM CD)  240 MG 24 hr capsule Take 240 mg by mouth every evening. 5 pm    . dofetilide (TIKOSYN) 250 MCG capsule TAKE ONE CAPSULE BY MOUTH EVERY 12 HOURS 60 capsule 6  . ezetimibe (ZETIA) 10 MG tablet Take 10 mg by mouth at bedtime.     . folic acid (FOLVITE) 1 MG tablet Take 1 mg by mouth daily.    Marland Kitchen. glipiZIDE (GLUCOTROL) 10 MG tablet Take 10 mg by mouth daily.    Marland Kitchen. linagliptin (TRADJENTA) 5 MG TABS tablet Take 5 mg by mouth daily.    Marland Kitchen. LORazepam (ATIVAN) 0.5 MG tablet Take 0.5 mg by mouth daily as needed for anxiety.    Marland Kitchen. losartan (COZAAR) 100 MG tablet TAKE 1 TABLET (100 MG TOTAL) BY MOUTH DAILY. 30 tablet 0  . Melatonin 3 MG TABS Take 1 tablet by mouth at bedtime.    . metFORMIN (GLUCOPHAGE) 1000 MG tablet Take 1,000 mg by mouth 2 (two) times daily with a meal.    . metoprolol succinate (TOPROL-XL) 50 MG 24 hr tablet Take 1 tablet (50 mg total) by mouth 2 (two) times daily. 60 tablet 11  . omega-3 acid ethyl esters (LOVAZA) 1 G capsule Take 1 g by mouth 2 (two) times daily.    . potassium chloride (KLOR-CON M10) 10 MEQ tablet Take 2 tablets (20 mEq total) by mouth daily. 60 tablet  4  . sertraline (ZOLOFT) 100 MG tablet Take 100 mg by mouth daily. 3 pm    . vitamin B-12 (CYANOCOBALAMIN) 100 MCG tablet Take 50 mcg by mouth daily. Pt is currently not taking this everyday    . warfarin (COUMADIN) 2.5 MG tablet Take 1-2 tablets (2.5-5 mg total) by mouth See admin instructions. 60 tablet 6  . zolpidem (AMBIEN CR) 6.25 MG CR tablet Take 6.25 mg by mouth.     No current facility-administered medications for this visit.    Allergies  Allergen Reactions  . Statins     myalgias    History   Social History  . Marital Status: Married    Spouse Name: N/A  . Number of Children: N/A  . Years of Education: N/A   Occupational History  . Not on file.   Social History Main Topics  . Smoking status: Passive Smoke Exposure - Never Smoker    Types: Cigarettes  . Smokeless tobacco: Never Used  . Alcohol  Use: Yes     Comment: 08/09/2013 "nothing to drink in >4 yrs; never had a problem w/it"  . Drug Use: No  . Sexual Activity: Not Currently   Other Topics Concern  . Not on file   Social History Narrative    Family History  Problem Relation Age of Onset  . Heart attack Mother   . Cardiomyopathy Mother   . Cancer - Lung Father   . Cancer - Other Maternal Grandmother   . Diabetes Maternal Grandfather   . Stroke Paternal Grandfather     ROS-  All systems are reviewed and are negative except as outlined in the HPI above  Physical Exam: Filed Vitals:   11/07/14 1459  BP: 150/88  Pulse: 69  Height:  (1.651 m)  Weight: 227 lb (102.967 kg)    GEN- The patient is well appearing, alert and oriented x 3 today.   Head- normocephalic, atraumatic Eyes-  Sclera clear, conjunctiva pink Ears- hearing intact Oropharynx- clear Neck- supple, no JVP Lymph- no cervical lymphadenopathy Lungs- Clear to ausculation bilaterally, normal work of breathing Heart- Regular rate and rhythm, no murmurs, rubs or gallops, PMI not laterally displaced GI- soft, NT, ND, + BS Extremities- no clubbing, cyanosis, or edema Neuro- strength and sensation are intact  ekg today reveals sinus rhythm  69 beats per minute with 1st degree AVB, otherwise normal EKG, QTc .   Assessment and Plan: 1.  Atrial fibrillation Tolerating tikosyn, maintaining sinus Check  Mg today, bmet reviewed from yesterday and shows normal kidney function. Continue coumadin long term (CHADS2VASC is at least 4)  2.  Sleep apnea Using CPAP on a regular basis.  3.  Hypertension Stable Elevated today Avoid salt and encouraged weight loss  4. Obesity with BMI of 35.84 Patient is aware and is starting an exercise program and trying to modify diet.  Return to see Dr. Johney Frame in 6 months.

## 2014-11-08 ENCOUNTER — Telehealth: Payer: Self-pay | Admitting: *Deleted

## 2014-11-08 MED ORDER — MAGNESIUM 200 MG PO TABS: ORAL_TABLET | ORAL | Status: DC

## 2014-11-08 NOTE — Telephone Encounter (Signed)
-----   Message from Rudi Cocoonna Carroll, NP sent at 11/08/2014  8:46 AM EST ----- Magnesium low. Recommend OTC supplement two a day x one week then decrease to one a day. May cause loose stools with two a day if so decrease to one a day. Repeat magnesium in two weeks.

## 2014-11-08 NOTE — Telephone Encounter (Signed)
Notified patient of lab result and recommendation to start magnesium supplement OTC 2x/day for one week then decrease to once/day. Patient instructed to have magnesium level checked in 2 weeks - wants to have this done at her primary doctors office (dr. Riley NearingAguiar) patient states she will have results faxed to us.  Patient also questioning whether she is taking potassium supplement or not. Informed pt she is prescribed potassium (klor-con) 20mEq

## 2014-12-07 LAB — PROTIME-INR

## 2014-12-25 ENCOUNTER — Telehealth (HOSPITAL_COMMUNITY): Payer: Self-pay | Admitting: *Deleted

## 2014-12-25 NOTE — Telephone Encounter (Signed)
Patient had called and left voicemail Friday afternoon stating she had a question regarding dental work and Therapist, occupationaltikosyn. Left message 4/18 for patient to call back

## 2015-01-05 LAB — PROTIME-INR: INR: 2 — AB (ref 0.9–1.1)

## 2015-02-08 ENCOUNTER — Other Ambulatory Visit: Payer: Self-pay

## 2015-02-08 ENCOUNTER — Telehealth: Payer: Self-pay

## 2015-02-09 ENCOUNTER — Other Ambulatory Visit (HOSPITAL_COMMUNITY): Payer: Self-pay | Admitting: *Deleted

## 2015-02-09 MED ORDER — WARFARIN SODIUM 5 MG PO TABS: 2.5000 mg | ORAL_TABLET | ORAL | Status: DC

## 2015-02-09 MED ORDER — COUMADIN 5 MG PO TABS: ORAL_TABLET | ORAL | Status: DC

## 2015-02-09 MED ORDER — WARFARIN SODIUM 5 MG PO TABS: ORAL_TABLET | ORAL | Status: DC

## 2015-02-09 NOTE — Telephone Encounter (Signed)
Talked with patient - last order for coumadin called in was 2.5mg  tablets but needed 5mg  tablets called in.  New RX was sent to pharmacy

## 2015-02-15 DIAGNOSIS — E669 Obesity, unspecified: Secondary | ICD-10-CM | POA: Insufficient documentation

## 2015-03-08 ENCOUNTER — Telehealth: Payer: Self-pay | Admitting: *Deleted

## 2015-03-08 NOTE — Telephone Encounter (Signed)
Patient called and wanted Dr Jenel LucksAllred's approval of her changing to generic tikosyn as [facility] Please advise. Thanks, MI

## 2015-03-09 NOTE — Telephone Encounter (Signed)
Spoke with pt.  We are okay with generic Tikosyn but would just request she come to the office to get a EKG a few weeks after starting the generic version.  She is agreeable and will call us once she starts the generic.

## 2015-04-19 LAB — PROTIME-INR: INR: 2.1 — AB (ref 0.9–1.1)

## 2015-04-27 ENCOUNTER — Other Ambulatory Visit: Payer: Self-pay | Admitting: Pharmacist

## 2015-04-27 MED ORDER — METOPROLOL SUCCINATE ER 50 MG PO TB24: 50.0000 mg | ORAL_TABLET | Freq: Two times a day (BID) | ORAL | Status: DC

## 2015-06-05 ENCOUNTER — Other Ambulatory Visit: Payer: Self-pay | Admitting: Internal Medicine

## 2015-07-16 ENCOUNTER — Encounter: Payer: Self-pay | Admitting: Internal Medicine

## 2015-08-06 ENCOUNTER — Other Ambulatory Visit: Payer: Self-pay | Admitting: Internal Medicine

## 2015-08-06 ENCOUNTER — Other Ambulatory Visit: Payer: Self-pay | Admitting: *Deleted

## 2015-08-06 ENCOUNTER — Telehealth: Payer: Self-pay | Admitting: Internal Medicine

## 2015-08-06 NOTE — Telephone Encounter (Signed)
Rx sent for pt earlier today.

## 2015-08-06 NOTE — Telephone Encounter (Signed)
New Message  Pt specifically requested to speak w/ representative on exactly when her Rx would be called in. Pt was made explicitly aware that she would need to give our office time to send in refill- pt requests to speak w/ Rep. Please call back and discuss.   *STAT* If patient is at the pharmacy, call can be transferred to refill team.   1. Which medications need to be refilled? (please list name of each medication and dose if known) tikosyn (generic) 250 mg  2. Which pharmacy/location (including street and city if local pharmacy) is medication to be sent to? [facility] [place]  3. Do they need a 30 day or 90 day supply? 90

## 2015-08-16 ENCOUNTER — Ambulatory Visit: Payer: Medicare Other | Admitting: Nurse Practitioner

## 2015-08-22 DIAGNOSIS — M79672 Pain in left foot: Secondary | ICD-10-CM | POA: Insufficient documentation

## 2015-09-19 DIAGNOSIS — R3129 Other microscopic hematuria: Secondary | ICD-10-CM | POA: Insufficient documentation

## 2015-11-26 DIAGNOSIS — E1165 Type 2 diabetes mellitus with hyperglycemia: Secondary | ICD-10-CM | POA: Insufficient documentation

## 2016-01-27 ENCOUNTER — Other Ambulatory Visit (HOSPITAL_COMMUNITY): Payer: Self-pay | Admitting: Nurse Practitioner

## 2016-02-09 ENCOUNTER — Other Ambulatory Visit: Payer: Self-pay | Admitting: Internal Medicine

## 2016-02-11 NOTE — Telephone Encounter (Signed)
02-11-16 pt called @ 1040am to make appt-scheduled with Amber 02-13-16 and pt needs refill of Tikosyn Veneda Melter0only has 2 pills left

## 2016-02-12 ENCOUNTER — Encounter: Payer: Self-pay | Admitting: Nurse Practitioner

## 2016-02-12 NOTE — Progress Notes (Signed)
Electrophysiology Office Note Date: 02/13/2016  ID:  Amy Wright, DOB 07-21-1941, MRN [id number]  PCP: Angelica Chessman., MD Primary Cardiologist: Tresa Endo Electrophysiologist: Allred  CC: follow up for atrial fibrillation  Amy Wright is a 75 y.o. female seen today for Dr Johney Frame.  She presents today for routine electrophysiology followup.  Since last being seen in our clinic, the patient reports doing reasonably well.  She has had 2 mechanical falls with subsequent ankle soreness.  She is clear that she did not have syncope. She does not think she has had any AF.  She has been compliant with Tikosyn.  She is also struggling with ongoing UTI's.  She denies chest pain, palpitations, dyspnea, PND, orthopnea, nausea, vomiting, dizziness, syncope, edema, weight gain, or early satiety.  Past Medical History  Diagnosis Date  . Persistent atrial fibrillation (HCC) 11/07/14    Chads2vasc score of at least 4  . Hyperlipidemia   . Hypertriglyceridemia   . 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 Central jet. Aortic Valve appears to be mildly sclerotic. Trace aortic regurgitation.EF >55%  . Hypertension   . Obesity   . OSA on CPAP   . Type II diabetes mellitus (HCC)   . Anxiety   . Chronic insomnia    Past Surgical History  Procedure Laterality Date  . Cesarean section  1971  . Cardioversion N/A 06/23/2013    Procedure: CARDIOVERSION;  Surgeon: Lennette Bihari, MD;  Location: [facility] ENDOSCOPY;  Service: Cardiovascular;  Laterality: N/A;  . Appendectomy  1971  . Placement of breast implants Bilateral 1985  . Cataract extraction w/ intraocular lens implant Bilateral     Current Outpatient Prescriptions  Medication Sig Dispense Refill  . Ascorbic Acid (VITAMIN C) 1000 MG tablet Take 3,000 mg by mouth daily.     . Cholecalciferol (VITAMIN D-3 PO) Take 1,000 mg by mouth daily.     . Choline Fenofibrate (TRILIPIX) 135 MG capsule Take 135 mg  by mouth daily at 6 PM.     . COUMADIN 5 MG tablet Take 0.5-1 tablet daily. See admin instructions 30 tablet 6  . diltiazem (CARDIZEM CD) 240 MG 24 hr capsule Take 240 mg by mouth every evening. 5 pm    . dofetilide (TIKOSYN) 250 MCG capsule TAKE ONE CAPSULE BY MOUTH EVERY 12 HOURS 180 capsule 0  . ezetimibe (ZETIA) 10 MG tablet Take 10 mg by mouth at bedtime.     . folic acid (FOLVITE) 1 MG tablet Take 1 mg by mouth daily.    Marland Kitchen glipiZIDE (GLUCOTROL XL) 5 MG 24 hr tablet Take 5 mg by mouth daily.    Marland Kitchen LANTUS SOLOSTAR 100 UNIT/ML Solostar Pen Inject 36 Units into the skin daily.    Marland Kitchen linagliptin (TRADJENTA) 5 MG TABS tablet Take 5 mg by mouth daily.    Marland Kitchen LORazepam (ATIVAN) 0.5 MG tablet Take 0.5 mg by mouth daily as needed for anxiety.    Marland Kitchen losartan (COZAAR) 100 MG tablet TAKE 1 TABLET (100 MG TOTAL) BY MOUTH DAILY. 30 tablet 0  . Magnesium 200 MG TABS Take twice a day for 1 week then decrease to once a day 60 each   . Melatonin 3 MG TABS Take 1 tablet by mouth at bedtime.    . metFORMIN (GLUCOPHAGE) 1000 MG tablet Take 1,000 mg by mouth 2 (two) times daily with a meal.    . metoprolol succinate (TOPROL-XL) 50 MG 24 hr tablet Take 1 tablet (  50 mg total) by mouth 2 (two) times daily. 60 tablet 11  . omega-3 acid ethyl esters (LOVAZA) 1 G capsule Take 1 g by mouth 2 (two) times daily.    . potassium chloride (KLOR-CON M10) 10 MEQ tablet Take 2 tablets (20 mEq total) by mouth daily. 60 tablet 4  . sertraline (ZOLOFT) 100 MG tablet Take 100 mg by mouth daily. 3 pm    . vitamin B-12 (CYANOCOBALAMIN) 100 MCG tablet Take 50 mcg by mouth daily. Pt is currently not taking this everyday    . zolpidem (AMBIEN CR) 6.25 MG CR tablet Take 6.25 mg by mouth.     No current facility-administered medications for this visit.    Allergies:   Rosuvastatin calcium; Statins; Atorvastatin; and Ezetimibe-simvastatin   Social History: Social History   Social History  . Marital Status: Married    Spouse Name: N/A   . Number of Children: N/A  . Years of Education: N/A   Occupational History  . Not on file.   Social History Main Topics  . Smoking status: Passive Smoke Exposure - Never Smoker    Types: Cigarettes  . Smokeless tobacco: Never Used  . Alcohol Use: Yes     Comment: 08/09/2013 "nothing to drink in >4 yrs; never had a problem w/it"  . Drug Use: No  . Sexual Activity: Not Currently   Other Topics Concern  . Not on file   Social History Narrative    Family History: Family History  Problem Relation Age of Onset  . Heart attack Mother   . Cardiomyopathy Mother   . Cancer - Lung Father   . Cancer - Other Maternal Grandmother   . Diabetes Maternal Grandfather   . Stroke Paternal Grandfather     Review of Systems: All other systems reviewed and are otherwise negative except as noted above.   Physical Exam: VS:  BP 140/80 mmHg  Pulse 68  Ht 5\' 5"  (1.651 m)  Wt 242 lb (109.77 kg)  BMI 40.27 kg/m2  SpO2 95% , BMI Body mass index is 40.27 kg/(m^2). Wt Readings from Last 3 Encounters:  02/13/16 242 lb (109.77 kg)  11/07/14 227 lb (102.967 kg)  03/27/14 218 lb 12.8 oz (99.247 kg)    GEN- The patient is obese appearing, alert and oriented x 3 today.   HEENT: normocephalic, atraumatic; sclera clear, conjunctiva pink; hearing intact; oropharynx clear; neck supple  Lungs- Clear to ausculation bilaterally, normal work of breathing.  No wheezes, rales, rhonchi Heart- Regular rate and rhythm  GI- soft, non-tender, non-distended, bowel sounds present  Extremities- no clubbing, cyanosis, + dependent edema MS- no significant deformity or atrophy Skin- warm and dry, no rash or lesion  Psych- euthymic mood, full affect Neuro- strength and sensation are intact   EKG:  EKG is ordered today. The ekg ordered today shows sinus rhythm, rate 62, QTc 477msec  Recent Labs: No results found for requested labs within last 365 days.    Other studies Reviewed: Additional studies/ records  that were reviewed today include: Dr Jenel LucksAllred's office notes  Assessment and Plan: 1.  Persistent atrial fibrillation Maintaining SR by symptoms Continue Tikosyn 250mcg bid - discussed with patient today importance of regular follow up BMET, Mg today.  QTc stable Continue Warfarin for CHADS2VASC of 4 INR today per patient's request, will forward to PCP  2.  HTN Stable No change required today  3.  OSA Compliance with CPAP encouraged  4.  Obesity Body mass index is 40.27  kg/(m^2). Weight loss encouraged She asks about appetite suppressants today, I advised regular exercise    5.  Recurrent UTI Advised to make sure PCP knows about Tikosyn and to avoid QT prolonging medications   Current medicines are reviewed at length with the patient today.   The patient does not have concerns regarding her medicines.  The following changes were made today:  none  Labs/ tests ordered today include: BMET, Mg, INR  No orders of the defined types were placed in this encounter.     Disposition:   Follow up with Dr Johney Frame 6 months     Signed, Gypsy Balsam, NP 02/13/2016 1:14 PM   [facility] [address] [place] [address] [phone] (office) [phone] (fax)

## 2016-02-13 ENCOUNTER — Ambulatory Visit (INDEPENDENT_AMBULATORY_CARE_PROVIDER_SITE_OTHER): Payer: Medicare Other | Admitting: Nurse Practitioner

## 2016-02-13 ENCOUNTER — Encounter: Payer: Self-pay | Admitting: Nurse Practitioner

## 2016-02-13 VITALS — BP 140/80 | HR 68 | Ht 65.0 in | Wt 242.0 lb

## 2016-02-13 DIAGNOSIS — G4733 Obstructive sleep apnea (adult) (pediatric): Secondary | ICD-10-CM | POA: Diagnosis not present

## 2016-02-13 DIAGNOSIS — I10 Essential (primary) hypertension: Secondary | ICD-10-CM | POA: Diagnosis not present

## 2016-02-13 DIAGNOSIS — I4819 Other persistent atrial fibrillation: Secondary | ICD-10-CM

## 2016-02-13 DIAGNOSIS — I481 Persistent atrial fibrillation: Secondary | ICD-10-CM | POA: Diagnosis not present

## 2016-02-13 DIAGNOSIS — Z9989 Dependence on other enabling machines and devices: Secondary | ICD-10-CM

## 2016-02-13 LAB — BASIC METABOLIC PANEL
BUN: 15 mg/dL (ref 7–25)
CALCIUM: 10 mg/dL (ref 8.6–10.4)
CHLORIDE: 104 mmol/L (ref 98–110)
CO2: 22 mmol/L (ref 20–31)
CREATININE: 0.68 mg/dL (ref 0.60–0.93)
GLUCOSE: 125 mg/dL — AB (ref 65–99)
Potassium: 4.5 mmol/L (ref 3.5–5.3)
Sodium: 140 mmol/L (ref 135–146)

## 2016-02-13 LAB — PROTIME-INR
INR: 2.11 — AB (ref <1.50)
Prothrombin Time: 23.9 seconds — ABNORMAL HIGH (ref 11.6–15.2)

## 2016-02-13 LAB — MAGNESIUM: Magnesium: 1.8 mg/dL (ref 1.5–2.5)

## 2016-02-13 NOTE — Patient Instructions (Addendum)
Medication Instructions:   Your physician recommends that you continue on your current medications as directed. Please refer to the Current Medication list given to you today.   If you need a refill on your cardiac medications before your next appointment, please call your pharmacy.  Labwork: BMET MAG AND PT/INR    Testing/Procedures:  NONE ORDER TODAY    Follow-Up:  Your physician wants you to follow-up in:  IN 6  MONTHS WITH DR Johney FrameALLRED   You will receive a reminder letter in the mail two months in advance. If you don't receive a letter, please call our office to schedule the   follow-up appointment.     Any Other Special Instructions Will Be Listed Below (If Applicable).

## 2016-02-14 ENCOUNTER — Telehealth: Payer: Self-pay | Admitting: *Deleted

## 2016-02-14 ENCOUNTER — Other Ambulatory Visit: Payer: Self-pay | Admitting: *Deleted

## 2016-02-14 MED ORDER — MAGNESIUM OXIDE 400 MG PO TABS: 400.0000 mg | ORAL_TABLET | Freq: Every day | ORAL | Status: AC

## 2016-02-14 NOTE — Telephone Encounter (Signed)
-----   Message from Marily LenteAmber K Seiler, NP sent at 02/13/2016  6:09 PM EDT ----- Please notify patient of stable labs.  Magnesium a little low. Start Mag-ox 400mg  daily.  Please send copy of labs to PCP

## 2016-02-27 ENCOUNTER — Other Ambulatory Visit (HOSPITAL_COMMUNITY): Payer: Self-pay | Admitting: Nurse Practitioner

## 2016-02-27 NOTE — Telephone Encounter (Signed)
Needs to be filled by MD following, Dr Riley NearingAguiar

## 2016-04-01 ENCOUNTER — Encounter: Payer: Self-pay | Admitting: Internal Medicine

## 2016-04-01 DIAGNOSIS — Z9229 Personal history of other drug therapy: Secondary | ICD-10-CM | POA: Insufficient documentation

## 2016-04-01 LAB — PROTIME-INR: INR: 2.6 — AB (ref 0.9–1.1)

## 2016-04-02 ENCOUNTER — Other Ambulatory Visit (HOSPITAL_COMMUNITY): Payer: Self-pay | Admitting: Nurse Practitioner

## 2016-05-12 ENCOUNTER — Other Ambulatory Visit: Payer: Self-pay | Admitting: Internal Medicine

## 2016-05-26 ENCOUNTER — Other Ambulatory Visit (HOSPITAL_COMMUNITY): Payer: Self-pay | Admitting: Nurse Practitioner

## 2016-06-03 ENCOUNTER — Encounter: Payer: Self-pay | Admitting: Internal Medicine

## 2016-06-04 ENCOUNTER — Ambulatory Visit (INDEPENDENT_AMBULATORY_CARE_PROVIDER_SITE_OTHER): Payer: Medicare Other | Admitting: Internal Medicine

## 2016-06-04 ENCOUNTER — Other Ambulatory Visit: Payer: Self-pay

## 2016-06-04 ENCOUNTER — Encounter: Payer: Self-pay | Admitting: Internal Medicine

## 2016-06-04 VITALS — BP 150/100 | HR 69 | Ht 65.0 in | Wt 243.4 lb

## 2016-06-04 DIAGNOSIS — I10 Essential (primary) hypertension: Secondary | ICD-10-CM

## 2016-06-04 DIAGNOSIS — I4819 Other persistent atrial fibrillation: Secondary | ICD-10-CM

## 2016-06-04 DIAGNOSIS — G4733 Obstructive sleep apnea (adult) (pediatric): Secondary | ICD-10-CM

## 2016-06-04 DIAGNOSIS — I481 Persistent atrial fibrillation: Secondary | ICD-10-CM | POA: Diagnosis not present

## 2016-06-04 DIAGNOSIS — R0602 Shortness of breath: Secondary | ICD-10-CM | POA: Diagnosis not present

## 2016-06-04 DIAGNOSIS — Z9989 Dependence on other enabling machines and devices: Secondary | ICD-10-CM

## 2016-06-04 MED ORDER — METOPROLOL SUCCINATE ER 50 MG PO TB24: 50.0000 mg | ORAL_TABLET | Freq: Every day | ORAL | 11 refills | Status: DC

## 2016-06-04 NOTE — Patient Instructions (Addendum)
Medication Instructions:  Your physician has recommended you make the following change in your medication:  1) Decrease Metoprolol to 50 mg once daily   Labwork: None ordered   Testing/Procedures: Your physician has requested that you have an echocardiogram. Echocardiography is a painless test that uses sound waves to create images of your heart. It provides your doctor with information about the size and shape of your heart and how well your heart's chambers and valves are working. This procedure takes approximately one hour. There are no restrictions for this procedure.  PLEASE CALL WHEN YOU ARE READY TO SCHEDULE ECHO [phone]     Follow-Up: Your physician wants you to follow-up in: 3 months with Dr. Johney FrameAllred.   Any Other Special Instructions Will Be Listed Below (If Applicable).     If you need a refill on your cardiac medications before your next appointment, please call your pharmacy.

## 2016-06-07 ENCOUNTER — Other Ambulatory Visit (HOSPITAL_COMMUNITY): Payer: Self-pay | Admitting: Nurse Practitioner

## 2016-06-09 NOTE — Progress Notes (Signed)
PCP:  Angelica Chessman., MD Primary Cardiologist:  Dr Tresa Endo  The patient presents today for routine electrophysiology followup. Doing reasonably well at this time.  Denies CP, SOB, presyncope or syncope.  Past Medical History:  Diagnosis Date  . Anxiety   . Chronic insomnia   . Hyperlipidemia   . Hypertension   . Hypertriglyceridemia   . Obesity   . OSA on CPAP   . Persistent atrial fibrillation (HCC) 11/07/14   Chads2vasc score of at least 4  . Type II diabetes mellitus (HCC)   . 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 Central jet. Aortic Valve appears to be mildly sclerotic. Trace aortic regurgitation.EF >55%   Past Surgical History:  Procedure Laterality Date  . APPENDECTOMY  1971  . CARDIOVERSION N/A 06/23/2013   Procedure: CARDIOVERSION;  Surgeon: Lennette Bihari, MD;  Location: [facility] ENDOSCOPY;  Service: Cardiovascular;  Laterality: N/A;  . CATARACT EXTRACTION W/ INTRAOCULAR LENS IMPLANT Bilateral   . CESAREAN SECTION  1971  . PLACEMENT OF BREAST IMPLANTS Bilateral 1985    Current Outpatient Prescriptions  Medication Sig Dispense Refill  . ACCU-CHEK COMPACT PLUS test strip CHECK BLOOD SUGAR TWICE DAILY OR AS DIRECTED  0  . Ascorbic Acid (VITAMIN C) 1000 MG tablet Take 3,000 mg by mouth daily.     . Cholecalciferol (VITAMIN D-3 PO) Take 1,000 mg by mouth daily.     . Choline Fenofibrate (TRILIPIX) 135 MG capsule Take 135 mg by mouth daily at 6 PM.     . COUMADIN 5 MG tablet Take 0.5-1 tablet daily. See admin instructions 30 tablet 6  . diltiazem (CARDIZEM CD) 240 MG 24 hr capsule Take 240 mg by mouth every evening. 5 pm    . dofetilide (TIKOSYN) 250 MCG capsule TAKE ONE CAPSULE BY MOUTH EVERY 12 HOURS 180 capsule 2  . ezetimibe (ZETIA) 10 MG tablet Take 10 mg by mouth at bedtime.     . folic acid (FOLVITE) 1 MG tablet Take 1 mg by mouth daily.    Marland Kitchen glipiZIDE (GLUCOTROL XL) 5 MG 24 hr tablet Take 5 mg by mouth daily.    Marland Kitchen  LANTUS SOLOSTAR 100 UNIT/ML Solostar Pen Inject 36 Units into the skin daily.    Marland Kitchen linagliptin (TRADJENTA) 5 MG TABS tablet Take 5 mg by mouth daily.    Marland Kitchen LORazepam (ATIVAN) 0.5 MG tablet Take 0.5 mg by mouth daily as needed for anxiety.    Marland Kitchen losartan (COZAAR) 100 MG tablet TAKE 1 TABLET (100 MG TOTAL) BY MOUTH DAILY. 30 tablet 0  . magnesium oxide (MAG-OX) 400 MG tablet Take 1 tablet (400 mg total) by mouth daily. 30 tablet 5  . Melatonin 3 MG TABS Take 1 tablet by mouth at bedtime.    . metFORMIN (GLUCOPHAGE) 1000 MG tablet Take 1,000 mg by mouth 2 (two) times daily with a meal.    . metoprolol succinate (TOPROL-XL) 50 MG 24 hr tablet Take 1 tablet (50 mg total) by mouth daily. 60 tablet 11  . omega-3 acid ethyl esters (LOVAZA) 1 G capsule Take 1 g by mouth daily.     . potassium chloride (KLOR-CON M10) 10 MEQ tablet Take 2 tablets (20 mEq total) by mouth daily. 60 tablet 4  . sertraline (ZOLOFT) 100 MG tablet Take 100 mg by mouth daily. 3 pm    . VESICARE 5 MG tablet Take 1 tablet by mouth every other day.  0  . vitamin B-12 (CYANOCOBALAMIN) 100  MCG tablet Take 50 mcg by mouth daily.     Marland Kitchen. zolpidem (AMBIEN CR) 6.25 MG CR tablet Take 6.25 mg by mouth at bedtime.      No current facility-administered medications for this visit.     Allergies  Allergen Reactions  . Rosuvastatin Calcium     myalgias  . Statins     myalgias  . Atorvastatin Rash  . Ezetimibe-Simvastatin Rash    Social History   Social History  . Marital status: Married    Spouse name: N/A  . Number of children: N/A  . Years of education: N/A   Occupational History  . Not on file.   Social History Main Topics  . Smoking status: Passive Smoke Exposure - Never Smoker    Types: Cigarettes  . Smokeless tobacco: Never Used  . Alcohol use Yes     Comment: 08/09/2013 "nothing to drink in >4 yrs; never had a problem w/it"  . Drug use: No  . Sexual activity: Not Currently   Other Topics Concern  . Not on file    Social History Narrative  . No narrative on file    Family History  Problem Relation Age of Onset  . Heart attack Mother   . Cardiomyopathy Mother   . Cancer - Lung Father   . Cancer - Other Maternal Grandmother   . Stroke Paternal Grandfather   . Diabetes Maternal Grandfather     ROS-  All systems are reviewed and are negative except as outlined in the HPI above  Physical Exam: Vitals:   06/04/16 1620  BP: (!) 150/100  Pulse: 69  SpO2: 93%  Weight: 243 lb 6.4 oz (110.4 kg)  Height: 5\' 5"  (1.651 m)    GEN- The patient is well appearing, alert and oriented x 3 today.   Head- normocephalic, atraumatic Eyes-  Sclera clear, conjunctiva pink Ears- hearing intact Oropharynx- clear Neck- supple, no JVP Lymph- no cervical lymphadenopathy Lungs- Clear to ausculation bilaterally, normal work of breathing Heart- Regular rate and rhythm, no murmurs, rubs or gallops, PMI not laterally displaced GI- soft, NT, ND, + BS Extremities- no clubbing, cyanosis, or edema Neuro- strength and sensation are intact  ekg is reviewed   Assessment and Plan: 1.  Atrial fibrillation Tolerating tikosyn and feels much better in sinus Wean metoprolol Continue coumadin long term (CHADS2VASC is at least 4) Update echo  2.  Sleep apnea Using CPAP on a regular basis.  3.  Hypertension Stable No change required today  4. Obesity  Patient is aware and is starting an exercise program and trying to modify diet.  Return to see me in 3 months  Fayrene FearingJames Christel Bai,MD

## 2016-06-11 ENCOUNTER — Other Ambulatory Visit (HOSPITAL_COMMUNITY): Payer: Self-pay | Admitting: Nurse Practitioner

## 2016-06-11 ENCOUNTER — Telehealth: Payer: Self-pay | Admitting: *Deleted

## 2016-06-11 NOTE — Telephone Encounter (Signed)
Patient is calling requesting a refill on her Warfarin.  We do not adjust this medication.  I spoke with Raven at Dr Rudene ReAguiar's ofice to see if they were adjusting.  The last labs they show are from 04/01/16 with at INR of 2.6.  The patient insist she was there in Aug and Sept.  I let her know the importance of having this monitored on a regular basis and she verbalizes understanding saying she has been on it for 10 years and understands.  Raven is going to discuss with Dr Riley NearingAguiar on Mon and call me back to let me know if they will follow and fill her med.  I have called in 6 pills for patient to get her to Monday.  She is aware and says she is going to call Dr Rudene ReAguiar's also.

## 2016-06-18 ENCOUNTER — Encounter: Payer: Self-pay | Admitting: Internal Medicine

## 2016-06-18 LAB — PROTIME-INR: INR: 2.9 — AB (ref 0.9–1.1)

## 2016-06-19 ENCOUNTER — Telehealth: Payer: Self-pay | Admitting: Internal Medicine

## 2016-06-19 NOTE — Telephone Encounter (Signed)
New Message   Pt c/o medication issue:  1. Name of Medication:  Lexapro-(not listed but prescribed by pts PCP) Tikosyn  2. How are you currently taking this medication (dosage and times per day)?  Tikosyn-250 mcg capsule once every 12 hrs Lexapro-(not listed but stated prescribed by pts PCP)  3. Are you having a reaction (difficulty breathing--STAT)? No  4. What is your medication issue? Pts pharmacist voiced they're not sure if they should take the Lexapro due to it may be a drug interaction with Tikosyn.  Please f/u with pharmacist

## 2016-06-19 NOTE — Telephone Encounter (Signed)
Spoke with Raven and patient's PCP office.  They are going to follow her INR's and dose her Coumadin.  They will call in her rx for medications also.  I also let her know about the Cymbalta being a better choice in place of her Zoloft.  They are going to contact patient and handle both.

## 2016-06-19 NOTE — Telephone Encounter (Signed)
Agree with pharmacist - there is a significant drug interaction between Tikosyn and SSRIs for QTc prolongation. Would recommend that patient contact his PCP to try an SNRI like duloxetine instead. This has similar efficacy but avoids an interaction with Tikosyn.

## 2016-06-19 NOTE — Telephone Encounter (Signed)
PCP is going to monitor and handle her Warfarin and INR's

## 2016-07-28 ENCOUNTER — Telehealth: Payer: Self-pay | Admitting: Internal Medicine

## 2016-07-28 NOTE — Telephone Encounter (Signed)
°  New Prob   Pt states she feels she is in A-fib. Requesting to speak to an Charity fundraiserN. Please call.

## 2016-07-29 ENCOUNTER — Encounter: Payer: Self-pay | Admitting: Internal Medicine

## 2016-07-29 NOTE — Telephone Encounter (Signed)
This encounter was created in error - please disregard.
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
## 2016-07-29 NOTE — Telephone Encounter (Signed)
Left message for patient to return my call.

## 2016-07-29 NOTE — Telephone Encounter (Signed)
Mrs. Amy Wright is returning a call ,. Please call .Marland Kitchen. Thanks

## 2016-07-30 ENCOUNTER — Ambulatory Visit (INDEPENDENT_AMBULATORY_CARE_PROVIDER_SITE_OTHER): Payer: Medicare Other | Admitting: Internal Medicine

## 2016-07-30 ENCOUNTER — Telehealth: Payer: Self-pay | Admitting: Interventional Cardiology

## 2016-07-30 VITALS — BP 142/100 | HR 112 | Ht 65.5 in | Wt 238.0 lb

## 2016-07-30 DIAGNOSIS — I10 Essential (primary) hypertension: Secondary | ICD-10-CM | POA: Diagnosis not present

## 2016-07-30 DIAGNOSIS — G4733 Obstructive sleep apnea (adult) (pediatric): Secondary | ICD-10-CM | POA: Diagnosis not present

## 2016-07-30 DIAGNOSIS — I4819 Other persistent atrial fibrillation: Secondary | ICD-10-CM

## 2016-07-30 DIAGNOSIS — Z9989 Dependence on other enabling machines and devices: Secondary | ICD-10-CM

## 2016-07-30 DIAGNOSIS — I481 Persistent atrial fibrillation: Secondary | ICD-10-CM

## 2016-07-30 LAB — BASIC METABOLIC PANEL
BUN: 19 mg/dL (ref 7–25)
CHLORIDE: 103 mmol/L (ref 98–110)
CO2: 23 mmol/L (ref 20–31)
CREATININE: 0.85 mg/dL (ref 0.60–0.93)
Calcium: 9.8 mg/dL (ref 8.6–10.4)
GLUCOSE: 194 mg/dL — AB (ref 65–99)
Potassium: 4.8 mmol/L (ref 3.5–5.3)
Sodium: 137 mmol/L (ref 135–146)

## 2016-07-30 LAB — CBC WITH DIFFERENTIAL/PLATELET
BASOS PCT: 0 %
Basophils Absolute: 0 cells/uL (ref 0–200)
EOS ABS: 212 {cells}/uL (ref 15–500)
Eosinophils Relative: 2 %
HCT: 41.1 % (ref 35.0–45.0)
Hemoglobin: 13.6 g/dL (ref 11.7–15.5)
LYMPHS PCT: 36 %
Lymphs Abs: 3816 cells/uL (ref 850–3900)
MCH: 26.3 pg — AB (ref 27.0–33.0)
MCHC: 33.1 g/dL (ref 32.0–36.0)
MCV: 79.3 fL — AB (ref 80.0–100.0)
MONOS PCT: 8 %
MPV: 9.5 fL (ref 7.5–12.5)
Monocytes Absolute: 848 cells/uL (ref 200–950)
NEUTROS PCT: 54 %
Neutro Abs: 5724 cells/uL (ref 1500–7800)
PLATELETS: 498 10*3/uL — AB (ref 140–400)
RBC: 5.18 MIL/uL — ABNORMAL HIGH (ref 3.80–5.10)
RDW: 15.4 % — AB (ref 11.0–15.0)
WBC: 10.6 10*3/uL (ref 3.8–10.8)

## 2016-07-30 LAB — PROTIME-INR
INR: 2.6 — ABNORMAL HIGH
Prothrombin Time: 26.2 s — ABNORMAL HIGH (ref 9.0–11.5)

## 2016-07-30 NOTE — Patient Instructions (Addendum)
Medication Instructions:  Your physician recommends that you continue on your current medications as directed. Please refer to the Current Medication list given to you today.   Labwork: Your physician recommends that you return for lab work today: BMP/MAG/CBC/INR  Please have your PCP fax over your last 2 INR's today to me Dennis BastKelly Crosley Stejskal, RN at [phone]  Testing/Procedures: Your physician has requested that you have an echocardiogram. Echocardiography is a painless test that uses sound waves to create images of your heart. It provides your doctor with information about the size and shape of your heart and how well your heart's chambers and valves are working. This procedure takes approximately one hour. There are no restrictions for this procedure.  Your physician has recommended that you have a Cardioversion (DCCV). Electrical Cardioversion uses a jolt of electricity to your heart either through paddles or wired patches attached to your chest. This is a controlled, usually prescheduled, procedure. Defibrillation is done under light anesthesia in the hospital, and you usually go home the day of the procedure. This is done to get your heart back into a normal rhythm. You are not awake for the procedure. Please see the instruction sheet given to you today.--08/04/16  [facility]30[facility] Do not eat or drink after midnight the night prior to the procedure Will need someone to drive you home after the procedure    Follow-Up: Your physician recommends that you schedule a follow-up appointment as scheduled

## 2016-07-30 NOTE — Progress Notes (Signed)
Electrophysiology Office Note Date: 07/30/2016  ID:  Amy Wright, DOB 1941/08/20, MRN [id number]  PCP: Angelica Chessman., MD Primary Cardiologist: Tresa Endo Electrophysiologist: Wetzel Meester  CC: follow up for atrial fibrillation  Amy Wright is a 75 y.o. female seen today for an add on visit after having recurrent atrial fibrillation. She got choked on Sunday and had coughing and developed atrial fibrillation afterwards.  She has remained in AF since. She is fatigued and has shortness of breath with exertion. She reports compliance with Warfarin and Tikosyn. She denies chest pain,  PND, orthopnea, nausea, vomiting, dizziness, syncope, edema, weight gain, or early satiety.  Past Medical History:  Diagnosis Date  . Anxiety   . Chronic insomnia   . Hyperlipidemia   . Hypertension   . Hypertriglyceridemia   . Obesity   . OSA on CPAP   . Persistent atrial fibrillation (HCC) 11/07/14   Chads2vasc score of at least 4  . Type II diabetes mellitus (HCC)   . 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 Central jet. Aortic Valve appears to be mildly sclerotic. Trace aortic regurgitation.EF >55%   Past Surgical History:  Procedure Laterality Date  . APPENDECTOMY  1971  . CARDIOVERSION N/A 06/23/2013   Procedure: CARDIOVERSION;  Surgeon: Lennette Bihari, MD;  Location: [facility] ENDOSCOPY;  Service: Cardiovascular;  Laterality: N/A;  . CATARACT EXTRACTION W/ INTRAOCULAR LENS IMPLANT Bilateral   . CESAREAN SECTION  1971  . PLACEMENT OF BREAST IMPLANTS Bilateral 1985    Current Outpatient Prescriptions  Medication Sig Dispense Refill  . Ascorbic Acid (VITAMIN C) 1000 MG tablet Take 3,000 mg by mouth daily.     . Cholecalciferol (VITAMIN D-3 PO) Take 1,000 mg by mouth daily.     . Choline Fenofibrate (TRILIPIX) 135 MG capsule Take 135 mg by mouth daily at 6 PM.     . COUMADIN 5 MG tablet Take 0.5-1 tablet daily. See admin instructions 30 tablet 6  .  diltiazem (CARDIZEM CD) 240 MG 24 hr capsule Take 240 mg by mouth every evening. 5 pm    . dofetilide (TIKOSYN) 250 MCG capsule TAKE ONE CAPSULE BY MOUTH EVERY 12 HOURS 180 capsule 2  . DULoxetine (CYMBALTA) 30 MG capsule Take 30 mg by mouth.    . ezetimibe (ZETIA) 10 MG tablet Take 10 mg by mouth at bedtime.     . folic acid (FOLVITE) 1 MG tablet Take 1 mg by mouth daily.    Marland Kitchen glipiZIDE (GLUCOTROL XL) 5 MG 24 hr tablet Take 5 mg by mouth daily.    Marland Kitchen LANTUS SOLOSTAR 100 UNIT/ML Solostar Pen Inject 36 Units into the skin daily.    Marland Kitchen LORazepam (ATIVAN) 0.5 MG tablet Take 0.5 mg by mouth daily as needed for anxiety.    Marland Kitchen losartan (COZAAR) 100 MG tablet TAKE 1 TABLET (100 MG TOTAL) BY MOUTH DAILY. 30 tablet 0  . magnesium oxide (MAG-OX) 400 MG tablet Take 1 tablet (400 mg total) by mouth daily. 30 tablet 5  . Melatonin 3 MG TABS Take 1 tablet by mouth at bedtime.    . metFORMIN (GLUCOPHAGE) 1000 MG tablet Take 1,000 mg by mouth 2 (two) times daily with a meal.    . metoprolol succinate (TOPROL-XL) 50 MG 24 hr tablet TAKE ONE TABLET BY MOUTH TWICE DAILY    . omega-3 acid ethyl esters (LOVAZA) 1 G capsule Take 1 g by mouth daily.     . potassium chloride (KLOR-CON M10) 10  MEQ tablet Take 2 tablets (20 mEq total) by mouth daily. 60 tablet 4  . vitamin B-12 (CYANOCOBALAMIN) 100 MCG tablet Take 50 mcg by mouth daily.     Marland Kitchen. zolpidem (AMBIEN CR) 6.25 MG CR tablet Take 6.25 mg by mouth at bedtime.     Marland Kitchen. ACCU-CHEK COMPACT PLUS test strip CHECK BLOOD SUGAR TWICE DAILY OR AS DIRECTED  0  . linagliptin (TRADJENTA) 5 MG TABS tablet Take 5 mg by mouth daily.     No current facility-administered medications for this visit.     Allergies:   Rosuvastatin calcium; Statins; Atorvastatin; and Ezetimibe-simvastatin   Social History: Social History   Social History  . Marital status: Married    Spouse name: N/A  . Number of children: N/A  . Years of education: N/A   Occupational History  . Not on file.    Social History Main Topics  . Smoking status: Passive Smoke Exposure - Never Smoker    Types: Cigarettes  . Smokeless tobacco: Never Used  . Alcohol use Yes     Comment: 08/09/2013 "nothing to drink in >4 yrs; never had a problem w/it"  . Drug use: No  . Sexual activity: Not Currently   Other Topics Concern  . Not on file   Social History Narrative  . No narrative on file    Family History: Family History  Problem Relation Age of Onset  . Heart attack Mother   . Cardiomyopathy Mother   . Cancer - Lung Father   . Cancer - Other Maternal Grandmother   . Stroke Paternal Grandfather   . Diabetes Maternal Grandfather     Review of Systems: All other systems reviewed and are otherwise negative except as noted above.   Physical Exam: VS:  BP (!) 142/100   Pulse (!) 112   Ht 5' 5.5" (1.664 m)   Wt 238 lb (108 kg)   BMI 39.00 kg/m  , BMI Body mass index is 39 kg/m. Wt Readings from Last 3 Encounters:  07/30/16 238 lb (108 kg)  06/04/16 243 lb 6.4 oz (110.4 kg)  02/13/16 242 lb (109.8 kg)    GEN- The patient is obese appearing, alert and oriented x 3 today.   HEENT: normocephalic, atraumatic; sclera clear, conjunctiva pink; hearing intact; oropharynx clear; neck supple  Lungs- Clear to ausculation bilaterally, normal work of breathing.  No wheezes, rales, rhonchi Heart- Irregular rate and rhythm  GI- soft, non-tender, non-distended, bowel sounds present  Extremities- no clubbing, cyanosis, + dependent edema MS- no significant deformity or atrophy Skin- warm and dry, no rash or lesion  Psych- euthymic mood, full affect Neuro- strength and sensation are intact   EKG:  EKG is ordered today. The ekg ordered today shows atrial fibrillation  Recent Labs: 02/13/2016: Magnesium 1.8 07/30/2016: BUN 19; Creat 0.85; Hemoglobin 13.6; Platelets 498; Potassium 4.8; Sodium 137    Other studies Reviewed: Additional studies/ records that were reviewed today include: Dr  Jenel LucksAllred's office notes  Assessment and Plan: 1.  Persistent atrial fibrillation Recurrent atrial fibrillation after coughing on Sunday. She has done well for several years on Tikosyn without recurrence. Will plan DCCV if INR last month at PCP was >2.  Check INR today.  Continue Tikosyn 250mcg bid  BMET, Mg today.  QTc stable Continue Warfarin for CHADS2VASC of 4 Ok to take extra Metoprolol as needed for tachypalpitations between now and when cardioversion can be done.   2.  HTN BP elevated in office today but stable  at home Continue current therapy   3.  OSA Compliance with CPAP encouraged  4.  Obesity Body mass index is 39 kg/m. Weight loss encouraged  Current medicines are reviewed at length with the patient today.   The patient does not have concerns regarding her medicines.  The following changes were made today:  none  Labs/ tests ordered today include: BMET, Mg, INR, DCCV Orders Placed This Encounter  Procedures  . Basic metabolic panel  . CBC with Differential  . Protime-INR  . EKG 12-Lead  . ECHOCARDIOGRAM COMPLETE     Disposition:   Follow up with Dr Johney FrameAllred 4 weeks as scheduled   Signed, Hillis RangeJames Quintell Bonnin, MD  07/30/2016 5:22 PM   [facility] [address] [place] [address] [phone] (office) [phone] (fax)

## 2016-07-30 NOTE — Telephone Encounter (Signed)
    She wants to cancel cardioversion.  Her pulse has returned to normal at [employer]7PM tonight after vomiting after eating [place].  She feels fine and will continue her Tikosyn.    She will call the office on Monday to f/u.  Corky CraftsJayadeep S Demerius Podolak, MD

## 2016-08-04 ENCOUNTER — Ambulatory Visit (HOSPITAL_COMMUNITY): Admission: RE | Admit: 2016-08-04 | Payer: Medicare Other | Source: Ambulatory Visit | Admitting: Cardiovascular Disease

## 2016-08-04 ENCOUNTER — Encounter (HOSPITAL_COMMUNITY): Admission: RE | Payer: Self-pay | Source: Ambulatory Visit

## 2016-08-04 SURGERY — CARDIOVERSION
Anesthesia: Monitor Anesthesia Care

## 2016-08-20 ENCOUNTER — Encounter: Payer: Self-pay | Admitting: Internal Medicine

## 2016-08-25 ENCOUNTER — Ambulatory Visit: Payer: Medicare Other | Admitting: Internal Medicine

## 2016-09-09 ENCOUNTER — Ambulatory Visit (HOSPITAL_COMMUNITY): Payer: Medicare Other | Attending: Cardiovascular Disease

## 2016-09-09 ENCOUNTER — Other Ambulatory Visit: Payer: Self-pay

## 2016-09-09 DIAGNOSIS — E785 Hyperlipidemia, unspecified: Secondary | ICD-10-CM | POA: Insufficient documentation

## 2016-09-09 DIAGNOSIS — I340 Nonrheumatic mitral (valve) insufficiency: Secondary | ICD-10-CM | POA: Insufficient documentation

## 2016-09-09 DIAGNOSIS — F419 Anxiety disorder, unspecified: Secondary | ICD-10-CM | POA: Insufficient documentation

## 2016-09-09 DIAGNOSIS — I10 Essential (primary) hypertension: Secondary | ICD-10-CM | POA: Diagnosis not present

## 2016-09-09 DIAGNOSIS — G4733 Obstructive sleep apnea (adult) (pediatric): Secondary | ICD-10-CM | POA: Diagnosis not present

## 2016-09-09 DIAGNOSIS — E119 Type 2 diabetes mellitus without complications: Secondary | ICD-10-CM | POA: Diagnosis not present

## 2016-09-09 DIAGNOSIS — E669 Obesity, unspecified: Secondary | ICD-10-CM | POA: Insufficient documentation

## 2016-09-09 DIAGNOSIS — I4819 Other persistent atrial fibrillation: Secondary | ICD-10-CM

## 2016-09-09 DIAGNOSIS — Z6839 Body mass index (BMI) 39.0-39.9, adult: Secondary | ICD-10-CM | POA: Insufficient documentation

## 2016-09-09 DIAGNOSIS — I481 Persistent atrial fibrillation: Secondary | ICD-10-CM | POA: Insufficient documentation

## 2016-09-12 ENCOUNTER — Telehealth: Payer: Self-pay | Admitting: Internal Medicine

## 2016-09-12 NOTE — Telephone Encounter (Signed)
Spoke with patient and she is aware of her results.

## 2016-09-12 NOTE — Telephone Encounter (Signed)
Mrs. Bonnielee Haffmmons is calling to find out her test results . Please call

## 2016-09-29 ENCOUNTER — Ambulatory Visit: Payer: Medicare Other | Admitting: Internal Medicine

## 2016-12-01 ENCOUNTER — Telehealth: Payer: Self-pay | Admitting: *Deleted

## 2016-12-01 NOTE — Telephone Encounter (Signed)
Faxed CPAP supply order back to [facility].

## 2016-12-03 DIAGNOSIS — N644 Mastodynia: Secondary | ICD-10-CM | POA: Insufficient documentation

## 2017-02-15 ENCOUNTER — Other Ambulatory Visit: Payer: Self-pay | Admitting: Internal Medicine

## 2017-04-22 ENCOUNTER — Telehealth: Payer: Self-pay | Admitting: Internal Medicine

## 2017-04-22 NOTE — Telephone Encounter (Signed)
Patient said she is not in A-fib but her HR last night was 50 and this morning was 40 rechecked is 57, also c/o fatigue, sob with activity. BP while on phone with patient is 147/71 & HR 57. Patient said her HR normally range around 60-68.  Previous BP readings were unavailable. No c/o chest pain but does describe an uncomfortable feeling in chest feeling of weakness. Patient said she felt lethargic. Speech is clear. No c/o n/v, dizziness, fever, chills. Patient is concerned about her heart rated decreasing and fluctuating.   All cardiac medications reconciled with patient while on the phone.  Patient offered an appt on 04/23/17 @9 :30 am in the a-fib clinic but patient declined stating this is too early d/t OSA and she needed an afternoon appointment. Patient [facility]. Patient advised that the first available afternoon appointment is on 09/24. Patient accepted the 10:30 am appt with Keitha ButteUrsuy on 05/01/17. Patient advised that if her symptoms got worse that she needed to go to the ED for an evaluation. Patient verbalized understanding of plan.

## 2017-04-22 NOTE — Telephone Encounter (Signed)
Patient calling, states that she is an AFIB patient and last night her heart rate was 50. Patient states that this morning her heart rate is 40. Patient states that she feels really weak and tired.

## 2017-04-23 ENCOUNTER — Ambulatory Visit (HOSPITAL_COMMUNITY): Payer: Medicare Other | Admitting: Nurse Practitioner

## 2017-04-23 ENCOUNTER — Telehealth: Payer: Self-pay | Admitting: Internal Medicine

## 2017-04-23 NOTE — Telephone Encounter (Signed)
Pt aware will discuss with Dr Graciela HusbandsKlein this afternoon .Amy Wright/cy

## 2017-04-23 NOTE — Telephone Encounter (Signed)
Patient calling, states that her heart rate went down to 42 and she is feeling really weak and fatigued.

## 2017-04-23 NOTE — Telephone Encounter (Signed)
Reviewed with Dr Graciela HusbandsKlein. Per Dr Graciela HusbandsKlein have pt stop metoprolol 50 mg bid and come in tomorrow  for ekg.Pt notified of med change and will come in tomorrow at 10:00 am for ekg ./cy

## 2017-04-24 ENCOUNTER — Ambulatory Visit (INDEPENDENT_AMBULATORY_CARE_PROVIDER_SITE_OTHER): Payer: Medicare Other | Admitting: *Deleted

## 2017-04-24 VITALS — BP 144/86 | HR 61 | Ht 65.0 in | Wt 241.4 lb

## 2017-04-24 DIAGNOSIS — R001 Bradycardia, unspecified: Secondary | ICD-10-CM | POA: Diagnosis not present

## 2017-04-24 DIAGNOSIS — Z79899 Other long term (current) drug therapy: Secondary | ICD-10-CM | POA: Diagnosis not present

## 2017-04-24 DIAGNOSIS — R531 Weakness: Secondary | ICD-10-CM

## 2017-04-24 DIAGNOSIS — R5383 Other fatigue: Secondary | ICD-10-CM | POA: Diagnosis not present

## 2017-04-24 NOTE — Patient Instructions (Signed)
Pt came in for ekg due to stopping Metoprolol 50 mg bid per Dr Graciela Husbands yesterday.  Ekg reviewed by Dr End have pt continue to hold Metoprolol and keep follow up appt.Pt agrees ./cu

## 2017-05-01 ENCOUNTER — Ambulatory Visit: Payer: Medicare Other | Admitting: Physician Assistant

## 2017-05-05 ENCOUNTER — Telehealth: Payer: Self-pay | Admitting: Internal Medicine

## 2017-05-05 NOTE — Telephone Encounter (Signed)
Returned call to pt - she states one of her teeth broke and she is going to the dentist to have it fixed. She is having a single dental extraction today. Her PCP manages her Coumadin. She states her PCP drew her INR last week and it was in range. Advised pt it is ok to continue her Coumadin for her extraction today. No further questions.

## 2017-05-05 NOTE — Telephone Encounter (Signed)
New message    Pt is calling asking for a call back. Please call. It's about her coumadin.

## 2017-05-27 ENCOUNTER — Telehealth: Payer: Self-pay | Admitting: Internal Medicine

## 2017-05-27 NOTE — Telephone Encounter (Signed)
Patient states that she has been SOB on exertion and her HR has been fluctuating between 75 and 128 since she came in for a nurse visit on 8/17. She states that she was taking toprol-XL  BID and was instructed to decrease it to once a day due to bradycardia. According to the note she was instructed to stop the metoprolol and keep follow up appointment on 8/24 with Francis Dowse. Patient denies having any SOB, chest pain, lightheadedness, dizziness, fluttering in her chest, palpitations, or any other symptoms at this time. Patient states that she is still taking Toprol-XL 50 mg QD. Patient states that she is taking coumadin and tikosyn 250 mg BID, and that Dr. Riley Nearing increased her diltiazem to 300 mg QD. Patient is requesting an appointment. Patient cancelled the appointment with Francis Dowse on 8/24 because she did not want to see a "nurse". I explained to the patient that Luster Landsberg was not a nurse, but the patient is stating that she wants an appointment with her cardiologist, Dr. Johney Frame. Patinet also denies an appointment with Rudi Coco in the Afib clinic. Made patient aware that I would speak to the EP scheduler to see when Dr. Johney Frame would be able to see her. Patient states that she is currently without power and that her phone is about to die and that she will call back at 2 PM today.

## 2017-05-27 NOTE — Telephone Encounter (Signed)
Patient c/o Palpitations:  High priority if patient c/o lightheadedness and shortness of breath.  1. How long have you been having palpitations? Last office visit with Christine 2. Are you currently experiencing lightheadedness and shortness of breath? sob 3. Have you checked your BP and heart rate? (document readings) HR 128 4. Are you experiencing any other symptoms? High HR

## 2017-05-27 NOTE — Telephone Encounter (Signed)
Patient calling back. Made patient aware that I spoke with the EP scheduler and that Dr. Johney Frame had an opening on 9/21 at 3:30 PM. Patient agrees to come during that time.

## 2017-05-29 ENCOUNTER — Ambulatory Visit (INDEPENDENT_AMBULATORY_CARE_PROVIDER_SITE_OTHER): Payer: Medicare Other | Admitting: Internal Medicine

## 2017-05-29 ENCOUNTER — Encounter: Payer: Self-pay | Admitting: Internal Medicine

## 2017-05-29 VITALS — BP 128/72 | HR 72 | Ht 65.0 in | Wt 243.8 lb

## 2017-05-29 DIAGNOSIS — Z9989 Dependence on other enabling machines and devices: Secondary | ICD-10-CM | POA: Diagnosis not present

## 2017-05-29 DIAGNOSIS — R001 Bradycardia, unspecified: Secondary | ICD-10-CM | POA: Diagnosis not present

## 2017-05-29 DIAGNOSIS — G4733 Obstructive sleep apnea (adult) (pediatric): Secondary | ICD-10-CM | POA: Diagnosis not present

## 2017-05-29 DIAGNOSIS — I481 Persistent atrial fibrillation: Secondary | ICD-10-CM

## 2017-05-29 DIAGNOSIS — I4819 Other persistent atrial fibrillation: Secondary | ICD-10-CM

## 2017-05-29 NOTE — Progress Notes (Signed)
PCP: Angelica Chessman, MD Primary Cardiologist: Dr Tresa Endo Primary EP: Dr Johney Frame  Amy Wright is a 76 y.o. female who presents today for routine electrophysiology followup.  Since last being seen in our clinic, the patient reports doing reasonably well.  She has chronic stable SOB which she attributes to being overweight.  She reports having heart rates 40s several weeks ago for which she called had her metoprolol dose was adjusted.  She has done better since. Today, she denies symptoms of chest pain,lower extremity edema, dizziness, presyncope, or syncope.  The patient is otherwise without complaint today.   Past Medical History:  Diagnosis Date  . Anxiety   . Chronic insomnia   . Hyperlipidemia   . Hypertension   . Hypertriglyceridemia   . Obesity   . OSA on CPAP   . Persistent atrial fibrillation (HCC) 11/07/14   Chads2vasc score of at least 4  . Type II diabetes mellitus (HCC)   . 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 Central jet. Aortic Valve appears to be mildly sclerotic. Trace aortic regurgitation.EF >55%   Past Surgical History:  Procedure Laterality Date  . APPENDECTOMY  1971  . CARDIOVERSION N/A 06/23/2013   Procedure: CARDIOVERSION;  Surgeon: Lennette Bihari, MD;  Location: [facility] ENDOSCOPY;  Service: Cardiovascular;  Laterality: N/A;  . CATARACT EXTRACTION W/ INTRAOCULAR LENS IMPLANT Bilateral   . CESAREAN SECTION  1971  . PLACEMENT OF BREAST IMPLANTS Bilateral 1985    ROS- all systems are reviewed and negatives except as per HPI above  Current Outpatient Prescriptions  Medication Sig Dispense Refill  . ACCU-CHEK COMPACT PLUS test strip CHECK BLOOD SUGAR TWICE DAILY OR AS DIRECTED  0  . Ascorbic Acid (VITAMIN C) 1000 MG tablet Take 3,000 mg by mouth daily.     . Cholecalciferol (VITAMIN D-3 PO) Take 1,000 mg by mouth daily.     . Choline Fenofibrate (TRILIPIX) 135 MG capsule Take 135 mg by mouth daily at 6 PM.     .  COUMADIN 5 MG tablet Take 0.5-1 tablet daily. See admin instructions 30 tablet 6  . diltiazem (CARDIZEM CD) 300 MG 24 hr capsule Take 300 mg by mouth daily.    Marland Kitchen dofetilide (TIKOSYN) 250 MCG capsule TAKE ONE CAPSULE BY MOUTH EVERY 12 HOURS 180 capsule 1  . DULoxetine (CYMBALTA) 30 MG capsule Take 30 mg by mouth.    . ezetimibe (ZETIA) 10 MG tablet Take 10 mg by mouth at bedtime.     . folic acid (FOLVITE) 1 MG tablet Take 1 mg by mouth daily.    Marland Kitchen glipiZIDE (GLUCOTROL) 10 MG tablet Take 10 mg by mouth daily.    Marland Kitchen LANTUS SOLOSTAR 100 UNIT/ML Solostar Pen Inject 36 Units into the skin daily.    Marland Kitchen linagliptin (TRADJENTA) 5 MG TABS tablet Take 5 mg by mouth daily.    Marland Kitchen LORazepam (ATIVAN) 0.5 MG tablet Take 0.5 mg by mouth daily as needed for anxiety.    Marland Kitchen losartan (COZAAR) 100 MG tablet TAKE 1 TABLET (100 MG TOTAL) BY MOUTH DAILY. 30 tablet 0  . magnesium oxide (MAG-OX) 400 MG tablet Take 1 tablet (400 mg total) by mouth daily. 30 tablet 5  . Melatonin 3 MG TABS Take 1 tablet by mouth at bedtime.    . metFORMIN (GLUCOPHAGE) 1000 MG tablet Take 1,000 mg by mouth 2 (two) times daily with a meal.    . metoprolol succinate (TOPROL-XL) 50 MG 24 hr tablet  Take 50 mg by mouth daily.  0  . omega-3 acid ethyl esters (LOVAZA) 1 G capsule Take 1 g by mouth daily.     . potassium chloride (K-DUR) 10 MEQ tablet Take 10 mEq by mouth daily.    . sertraline (ZOLOFT) 100 MG tablet Take 100 mg by mouth daily.    . vitamin B-12 (CYANOCOBALAMIN) 100 MCG tablet Take 50 mcg by mouth daily.     Marland Kitchen zolpidem (AMBIEN CR) 6.25 MG CR tablet Take 6.25 mg by mouth at bedtime.      No current facility-administered medications for this visit.     Physical Exam: Vitals:   05/29/17 1526  BP: 128/72  Pulse: 72  SpO2: 95%  Weight: 243 lb 12.8 oz (110.6 kg)  Height:  (1.651 m)    GEN- The patient is overweight appearing, alert and oriented x 3 today.   Head- normocephalic, atraumatic Eyes-  Sclera clear, conjunctiva  pink Ears- hearing intact Oropharynx- clear Lungs- Clear to ausculation bilaterally, normal work of breathing Heart- Regular rate and rhythm, no murmurs, rubs or gallops, PMI not laterally displaced GI- soft, NT, ND, + BS Extremities- no clubbing, cyanosis, or edema  EKG tracing ordered today is personally reviewed and shows sinus rhythm 72 bpm, PR 220 msec, Qtc 446 msec  Assessment and Plan:  1. Persistent afib Doing reasonably well with tikosyn Wishes to have PCP follow bmet, mg twice per year.  She reports that she is compliant Continue long term anticoagulation for chads2vasc score of 4.  2. Palpitations Unclear etiology We discussed ILR placement  If persists, she may be willing to consider long term monitoring on return  3. Morbid obesity We discussed lifestyle modification today Body mass index is 40.57 kg/m.  4. HTN Stable No change required today  4. OSA Compliance with CPAP encouraged  Return to see me in 2 months If doing well, will then follow yearly  Hillis Range MD, [facility] 05/29/2017 3:47 PM

## 2017-05-29 NOTE — Patient Instructions (Signed)
Medication Instructions:  Your physician recommends that you continue on your current medications as directed. Please refer to the Current Medication list given to you today.   Labwork: None ordered   Testing/Procedures: None ordered   Follow-Up: Your physician recommends that you schedule a follow-up appointment in: 2 months with Dr Allred   Any Other Special Instructions Will Be Listed Below (If Applicable).     If you need a refill on your cardiac medications before your next appointment, please call your pharmacy.   

## 2017-08-12 ENCOUNTER — Other Ambulatory Visit: Payer: Self-pay | Admitting: Internal Medicine

## 2017-12-14 ENCOUNTER — Other Ambulatory Visit: Payer: Self-pay | Admitting: Internal Medicine

## 2017-12-18 ENCOUNTER — Telehealth: Payer: Self-pay | Admitting: Internal Medicine

## 2017-12-18 NOTE — Telephone Encounter (Signed)
New Message:     STAT if HR is under 50 or over 120 (normal HR is 60-100 beats per minute)  1) What is your heart rate? 65 today and yesterday was 170  2) Do you have a log of your heart rate readings (document readings)? 65 today, 90 two hours ago  3) Do you have any other symptoms? Dizziness and fatigue

## 2017-12-18 NOTE — Telephone Encounter (Signed)
Called patient who states that she has intermittent episodes of her HR being elevated for the past week. She states that yesterday it got as high as 174. She states that her HR normally runs 65-68. Today it has been 65-90. Her BP has been fine, 140/76. Patient states she would feel dizzy when her HR gets elevated. She states that she has been fatigued lately. Patient denies any additional symptoms. Patient had stopped Toprol-XL 2 months ago d/t bradycardia in the 40s. Patient states that she restarted taking the Toprol-XL 50 mg BID 4 days ago. Patient states that she was taking the Toprol-XL BID before she stopped it and so that is what she is taking now. Patient also takes Tikosyn 250 mg BID, diltiazem 300 mg QD, and losartan-HCTZ 100-25 mg QD. Patient denies symptoms at this time. Patient states that she is seeing her PCP next week. Patient overdue for f/u with Dr. Johney FrameAllred. Next available appointment not until June. No EP APP appointments available soon either. Offered for patient to be seen in the Afib Clinic next week, but patient refused and states that she will just wait to see what JA says. Made her aware that [facility]. Will send to EP scheduler as well to see if she can find a sooner appointment. Made patient aware that since it is Friday and 5:15 PM and the office is closed that she will receive a call on Monday. Instructed the patient to be seen in the ER over the weekend if her HR remains elevated and she becomes symptomatic. Patient verbalized understanding and thanked me for the call.

## 2017-12-21 ENCOUNTER — Encounter: Payer: Self-pay | Admitting: Internal Medicine

## 2017-12-21 ENCOUNTER — Telehealth: Payer: Self-pay

## 2017-12-21 ENCOUNTER — Ambulatory Visit: Payer: Medicare Other | Admitting: Internal Medicine

## 2017-12-21 VITALS — BP 142/90 | HR 111 | Ht 65.0 in | Wt 244.0 lb

## 2017-12-21 DIAGNOSIS — I481 Persistent atrial fibrillation: Secondary | ICD-10-CM | POA: Diagnosis not present

## 2017-12-21 DIAGNOSIS — R002 Palpitations: Secondary | ICD-10-CM | POA: Diagnosis not present

## 2017-12-21 DIAGNOSIS — Z9989 Dependence on other enabling machines and devices: Secondary | ICD-10-CM

## 2017-12-21 DIAGNOSIS — G4733 Obstructive sleep apnea (adult) (pediatric): Secondary | ICD-10-CM

## 2017-12-21 DIAGNOSIS — I10 Essential (primary) hypertension: Secondary | ICD-10-CM | POA: Diagnosis not present

## 2017-12-21 DIAGNOSIS — I4819 Other persistent atrial fibrillation: Secondary | ICD-10-CM

## 2017-12-21 NOTE — Telephone Encounter (Signed)
Pt will come for appt at 12:00 pm

## 2017-12-21 NOTE — Progress Notes (Signed)
PCP: Angelica ChessmanAguiar, Rafaela M, MD Primary Cardiologist: Dr Tresa EndoKelly Primary EP: Dr Johney FrameAllred  Amy MauJeanette Wright is a 77 y.o. female who presents today for routine electrophysiology followup.  Since last being seen in our clinic, the patient reports doing reasonably well.  + palpitations and fatigue for 2 weeks.  She is in afib today.  Today, she denies symptoms of chest pain, shortness of breath,  lower extremity edema, dizziness, presyncope, or syncope.  The patient is otherwise without complaint today.   Past Medical History:  Diagnosis Date  . Anxiety   . Chronic insomnia   . Hyperlipidemia   . Hypertension   . Hypertriglyceridemia   . Obesity   . OSA on CPAP   . Persistent atrial fibrillation (HCC) 11/07/14   Chads2vasc score of at least 4  . Type II diabetes mellitus (HCC)   . 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 Central jet. Aortic Valve appears to be mildly sclerotic. Trace aortic regurgitation.EF >55%   Past Surgical History:  Procedure Laterality Date  . APPENDECTOMY  1971  . CARDIOVERSION N/A 06/23/2013   Procedure: CARDIOVERSION;  Surgeon: Lennette Biharihomas A Kelly, MD;  [facility];  Service: Cardiovascular;  Laterality: N/A;  . CATARACT EXTRACTION W/ INTRAOCULAR LENS IMPLANT Bilateral   . CESAREAN SECTION  1971  . PLACEMENT OF BREAST IMPLANTS Bilateral 1985    ROS- all systems are reviewed and negatives except as per HPI above  Current Outpatient Medications  Medication Sig Dispense Refill  . ACCU-CHEK COMPACT PLUS test strip CHECK BLOOD SUGAR TWICE DAILY OR AS DIRECTED  0  . Ascorbic Acid (VITAMIN C) 1000 MG tablet Take 3,000 mg by mouth daily.     . Cholecalciferol (VITAMIN D-3 PO) Take 1,000 mg by mouth daily.     . Choline Fenofibrate (TRILIPIX) 135 MG capsule Take 135 mg by mouth daily at 6 PM.     . COUMADIN 5 MG tablet Take 0.5-1 tablet daily. See admin instructions 30 tablet 6  . diltiazem (CARDIZEM CD) 300 MG 24 hr  capsule Take 300 mg by mouth daily.    Marland Kitchen. dofetilide (TIKOSYN) 250 MCG capsule TAKE ONE CAPSULE BY MOUTH EVERY 12 HOURS 60 capsule 0  . ezetimibe (ZETIA) 10 MG tablet Take 10 mg by mouth at bedtime.     . folic acid (FOLVITE) 1 MG tablet Take 1 mg by mouth daily.    Marland Kitchen. glipiZIDE (GLUCOTROL) 10 MG tablet Take 10 mg by mouth daily.    Marland Kitchen. LANTUS SOLOSTAR 100 UNIT/ML Solostar Pen Inject 45 Units into the skin daily.     Marland Kitchen. LORazepam (ATIVAN) 0.5 MG tablet Take 0.5 mg by mouth daily as needed for anxiety.    Marland Kitchen. losartan-hydrochlorothiazide (HYZAAR) 100-25 MG tablet Take 1 tablet by mouth daily.    . magnesium oxide (MAG-OX) 400 MG tablet Take 1 tablet (400 mg total) by mouth daily. 30 tablet 5  . Melatonin 3 MG TABS Take 1 tablet by mouth at bedtime.    . metFORMIN (GLUCOPHAGE) 1000 MG tablet Take 1,000 mg by mouth 2 (two) times daily with a meal.    . mirabegron ER (MYRBETRIQ) 50 MG TB24 tablet Take 50 mg by mouth daily.    Marland Kitchen. omega-3 acid ethyl esters (LOVAZA) 1 G capsule Take 1 g by mouth daily.     . potassium chloride (K-DUR) 10 MEQ tablet Take 10 mEq by mouth daily.    . sertraline (ZOLOFT) 100 MG tablet Take 100  mg by mouth daily.    . vitamin B-12 (CYANOCOBALAMIN) 100 MCG tablet Take 50 mcg by mouth daily.     Marland Kitchen warfarin (COUMADIN) 2.5 MG tablet Take by mouth as directed.    . zolpidem (AMBIEN CR) 6.25 MG CR tablet Take 6.25 mg by mouth at bedtime.     . metoprolol succinate (TOPROL-XL) 50 MG 24 hr tablet Take 50 mg by mouth 2 (two) times daily.   0   No current facility-administered medications for this visit.     Physical Exam: Vitals:   12/21/17 1401  BP: (!) 142/90  Pulse: (!) 111  Weight: 244 lb (110.7 kg)  Height: 5\' 5"  (1.651 m)    GEN- The patient is well appearing, alert and oriented x 3 today.   Head- normocephalic, atraumatic Eyes-  Sclera clear, conjunctiva pink Ears- hearing intact Oropharynx- clear Lungs- Clear to ausculation bilaterally, normal work of  breathing Heart- irregular rate and rhythm, no murmurs, rubs or gallops, PMI not laterally displaced GI- soft, NT, ND, + BS Extremities- no clubbing, cyanosis, or edema  EKG tracing ordered today is personally reviewed and shows afib, V rate 110 bpm, nonspecific St/T changes  Wt Readings from Last 3 Encounters:  12/21/17 244 lb (110.7 kg)  05/29/17 243 lb 12.8 oz (110.6 kg)  04/24/17 241 lb 6.4 oz (109.5 kg)     Assessment and Plan:  1. Persistent afib Back in afib today despite tikosyn On coumadin for chads2vasc score of 4 We discussed options of cardioversion, amiodarone, and ablation.   She would like to try cardioversion at this time.  She states "I have done really well for the past year on tikosyn".  If she fails tikosyn then we should consider amiodarone or ablation.  2. Morbid obesity Body mass index is 40.6 kg/m. Lifestyle modification again encouraged.  She has made no progress  3. HTN Stable No change required today  4. OSA Compliance with CPAP encouraged  Return to see Lupita Leash in the AF clinic in 4 weeks  Hillis Range MD, [facility] 12/21/2017 2:14 PM

## 2017-12-21 NOTE — Telephone Encounter (Signed)
Call returned to Pt.  Asked Pt to come in for appt today 12/21/2017 @ 10:30 am. Gave direct # for Pt to confirm.

## 2017-12-21 NOTE — Patient Instructions (Addendum)
Medication Instructions:  Your physician recommends that you continue on your current medications as directed. Please refer to the Current Medication list given to you today.  Labwork: You will get lab work today:  BMET, Jodelle GreenMag, and PT/INR--December 21, 2017 Then December 29, 2017-INR Then January 04, 2018-INR Then you will get one prior to your procedure on Jan 12, 2018  Testing/Procedures: Your physician has recommended that you have a Cardioversion (DCCV). Electrical Cardioversion uses a jolt of electricity to your heart either through paddles or wired patches attached to your chest. This is a controlled, usually prescheduled, procedure. Defibrillation is done under light anesthesia in the hospital, and you usually go home the day of the procedure. This is done to get your heart back into a normal rhythm. You are not awake for the procedure. Please see the instruction sheet given to you today.   Follow-Up: Your physician wants you to follow-up in: 4 weeks with Rudi Cocoonna Carroll, NP at the afib clinic.   Any Other Special Instructions Will Be Listed Below (If Applicable).  If you need a refill on your cardiac medications before your next appointment, please call your pharmacy.    You are scheduled for a Cardioversion on Jan 13, 2018 with Dr. Duke Salviaandolph.  Please arrive [facility]trance A[facility] at [facility]: [address] [place], [place] [address] at 11:30 am (1 hour prior to procedure unless lab work is needed; if lab work is needed arrive 1.5 hours ahead)  DIET: Nothing to eat or drink after midnight except a sip of water with medications (see medication instructions below)  Medication Instructions:  On the AM of your procedure take all your morning medications except for: Hold all diabetic medications and your Hyzaar  Continue your anticoagulant: warfarin.  Do not miss any doses.  If you miss a dose of your warfarin your cardioversion will be rescheduled. You will need to  continue your anticoagulant after your procedure until you  are told by your Provider that it is safe to stop   Labs: If patient is on Coumadin, patient needs pt/INR, CBC, BMET within 3 days (No pt/INR needed for patients taking Xarelto, Eliquis, Pradaxa)  You must have a responsible person to drive you home and stay in the waiting area during your procedure. Failure to do so could result in cancellation.  Bring your insurance cards.  *Special Note: Every effort is made to have your procedure done on time. Occasionally there are emergencies that occur at the hospital that may cause delays. Please be patient if a delay does occur.

## 2017-12-22 LAB — SPECIMEN STATUS

## 2017-12-23 LAB — BASIC METABOLIC PANEL
BUN / CREAT RATIO: 19 (ref 12–28)
BUN: 16 mg/dL (ref 8–27)
CO2: 21 mmol/L (ref 20–29)
Calcium: 10.1 mg/dL (ref 8.7–10.3)
Chloride: 102 mmol/L (ref 96–106)
Creatinine, Ser: 0.83 mg/dL (ref 0.57–1.00)
GFR, EST AFRICAN AMERICAN: 80 mL/min/{1.73_m2} (ref >59)
GFR, EST NON AFRICAN AMERICAN: 70 mL/min/{1.73_m2} (ref >59)
Glucose: 164 mg/dL — ABNORMAL HIGH (ref 65–99)
POTASSIUM: 5.1 mmol/L (ref 3.5–5.2)
SODIUM: 140 mmol/L (ref 134–144)

## 2017-12-23 LAB — SPECIMEN STATUS REPORT

## 2017-12-23 LAB — PROTIME-INR
INR: 2.5 — ABNORMAL HIGH (ref 0.8–1.2)
Prothrombin Time: 24.7 s — ABNORMAL HIGH (ref 9.1–12.0)

## 2017-12-23 LAB — MAGNESIUM: Magnesium: 1.7 mg/dL (ref 1.6–2.3)

## 2017-12-24 ENCOUNTER — Telehealth: Payer: Self-pay | Admitting: Internal Medicine

## 2017-12-24 NOTE — Telephone Encounter (Signed)
Call returned to Pt. Pt states she went back into normal SR and feels fine.   Notified Pt that her magnesium level is less than 2.  Pt states she takes her mag supplement daily. Pt to see PCP today and will ask about any addl magnesium supplement she could take.  Pt lab work forwarded to Dr. Riley NearingAguiar per Pt request. All lab work and DCCV will be cancelled. Pt will call office Monday to set up f/u with Dr. Johney FrameAllred.

## 2017-12-24 NOTE — Telephone Encounter (Signed)
New Message   Pt calling to speak to a nurse, stated its important and that she is not in afib anymore and she is back in normal rythym

## 2017-12-24 NOTE — Telephone Encounter (Signed)
Follow UP:    Pt said to cx her May 8th procedure,she is not in Atrial Fib any more.

## 2017-12-29 ENCOUNTER — Other Ambulatory Visit: Payer: Medicare Other

## 2018-01-04 ENCOUNTER — Other Ambulatory Visit: Payer: Medicare Other

## 2018-01-08 ENCOUNTER — Other Ambulatory Visit: Payer: Self-pay | Admitting: Internal Medicine

## 2018-01-13 ENCOUNTER — Ambulatory Visit (HOSPITAL_COMMUNITY): Admit: 2018-01-13 | Payer: Medicare Other | Admitting: Cardiovascular Disease

## 2018-01-13 ENCOUNTER — Encounter (HOSPITAL_COMMUNITY): Payer: Self-pay

## 2018-01-13 SURGERY — CARDIOVERSION
Anesthesia: General

## 2018-02-24 ENCOUNTER — Ambulatory Visit: Payer: Medicare Other | Admitting: Internal Medicine

## 2018-02-26 ENCOUNTER — Other Ambulatory Visit: Payer: Self-pay

## 2018-02-26 MED ORDER — POTASSIUM CHLORIDE ER 10 MEQ PO TBCR: 10.0000 meq | EXTENDED_RELEASE_TABLET | Freq: Every day | ORAL | 2 refills | Status: DC

## 2018-04-01 ENCOUNTER — Ambulatory Visit: Payer: Medicare Other | Admitting: Physician Assistant

## 2018-04-09 ENCOUNTER — Encounter: Payer: Self-pay | Admitting: Physician Assistant

## 2018-04-29 ENCOUNTER — Telehealth: Payer: Self-pay | Admitting: Internal Medicine

## 2018-04-29 NOTE — Telephone Encounter (Signed)
Follow up  ° ° °Patient is returning call.  °

## 2018-04-29 NOTE — Telephone Encounter (Signed)
Left a message on husband's cell phone, per dpr, because her primary number has a screening tool that blocked my outgoing call without allowing a voicemail to be left.

## 2018-04-29 NOTE — Telephone Encounter (Signed)
Patient c/o Palpitations:  High priority if patient c/o lightheadedness, shortness of breath, or chest pain  1) How long have you had palpitations/irregular HR/ Afib? Are you having the symptoms now? 1wk  100-117  2) Are you currently experiencing lightheadedness, SOB or CP? yes  3) Do you have a history of afib (atrial fibrillation) or irregular heart rhythm? Yes   4) Have you checked your BP or HR? (document readings if available): ??  5) Are you experiencing any other symptoms? no

## 2018-04-29 NOTE — Telephone Encounter (Signed)
Pt called to report that she has been having feelings of being in and out of afib over the last few days. She said her heart rate has been up to 100 on and off. She denies chest pain, palpitations, but is having some sob. She says that she doesn't feel bad enough to go to the ER. She is also taking her Tikosyn, metoprolol, and coumadin every day. I advised pt that I will talk with Dr. Johney FrameAllred and possibly the afib clinic to see if we can get her in. I advised her that if anything worsens and she feels bad that she should consider going to the ER and they can put her on the monitor. Pt agrees and verbalized understanding and will wait for my phone call back.

## 2018-04-30 ENCOUNTER — Ambulatory Visit: Payer: Medicare Other | Admitting: Physician Assistant

## 2018-04-30 NOTE — Telephone Encounter (Signed)
Attempted to call pt again.. No answer and no voice mail option

## 2018-04-30 NOTE — Telephone Encounter (Signed)
Left message for pt to call back... After talking with Jenny/ Dr. Johney FrameAllred.Marland Kitchen. Appt made in the Afib clinic 05/05/18 at 2pm.

## 2018-04-30 NOTE — Telephone Encounter (Signed)
Another message left for pt on her cell

## 2018-05-03 ENCOUNTER — Telehealth: Payer: Self-pay | Admitting: Internal Medicine

## 2018-05-03 NOTE — Telephone Encounter (Signed)
° °  Patient declined to see Rudi CocoDonna Carroll on 8/28, appt canceled at the request of patient. Patient has concerns about her HR.   1) What is your heart rate? 110  2) Do you have a log of your heart rate readings (document readings)? 80-90  3) Do you have any other symptoms? SOB at times    1. Are you currently SOB (can you hear that pt is SOB on the phone)? No  2. How long have you been experiencing SOB?  A "few days" per patient  3. Are you SOB when sitting or when up moving around? Moving around  4. Are you currently experiencing any other symptoms? tired

## 2018-05-04 ENCOUNTER — Ambulatory Visit (HOSPITAL_COMMUNITY)
Admission: RE | Admit: 2018-05-04 | Discharge: 2018-05-04 | Disposition: A | Discharge status: Home / Self Care | Payer: Medicare Other | Source: Ambulatory Visit | Attending: Nurse Practitioner | Admitting: Nurse Practitioner

## 2018-05-04 ENCOUNTER — Encounter (HOSPITAL_COMMUNITY): Payer: Self-pay | Admitting: Nurse Practitioner

## 2018-05-04 VITALS — BP 166/96 | HR 107 | Ht 65.0 in | Wt 246.0 lb

## 2018-05-04 DIAGNOSIS — Z7722 Contact with and (suspected) exposure to environmental tobacco smoke (acute) (chronic): Secondary | ICD-10-CM | POA: Diagnosis not present

## 2018-05-04 DIAGNOSIS — I481 Persistent atrial fibrillation: Secondary | ICD-10-CM | POA: Diagnosis not present

## 2018-05-04 DIAGNOSIS — Z79899 Other long term (current) drug therapy: Secondary | ICD-10-CM | POA: Insufficient documentation

## 2018-05-04 DIAGNOSIS — E669 Obesity, unspecified: Secondary | ICD-10-CM | POA: Diagnosis not present

## 2018-05-04 DIAGNOSIS — I4892 Unspecified atrial flutter: Secondary | ICD-10-CM | POA: Insufficient documentation

## 2018-05-04 DIAGNOSIS — I10 Essential (primary) hypertension: Secondary | ICD-10-CM | POA: Insufficient documentation

## 2018-05-04 DIAGNOSIS — E119 Type 2 diabetes mellitus without complications: Secondary | ICD-10-CM | POA: Diagnosis not present

## 2018-05-04 DIAGNOSIS — Z880 Allergy status to penicillin: Secondary | ICD-10-CM | POA: Diagnosis not present

## 2018-05-04 DIAGNOSIS — Z888 Allergy status to other drugs, medicaments and biological substances status: Secondary | ICD-10-CM | POA: Insufficient documentation

## 2018-05-04 DIAGNOSIS — Z7901 Long term (current) use of anticoagulants: Secondary | ICD-10-CM | POA: Diagnosis not present

## 2018-05-04 DIAGNOSIS — Z794 Long term (current) use of insulin: Secondary | ICD-10-CM | POA: Diagnosis not present

## 2018-05-04 DIAGNOSIS — I4819 Other persistent atrial fibrillation: Secondary | ICD-10-CM

## 2018-05-04 DIAGNOSIS — Z6841 Body Mass Index (BMI) 40.0 and over, adult: Secondary | ICD-10-CM | POA: Insufficient documentation

## 2018-05-04 DIAGNOSIS — Z9889 Other specified postprocedural states: Secondary | ICD-10-CM | POA: Insufficient documentation

## 2018-05-04 DIAGNOSIS — G4733 Obstructive sleep apnea (adult) (pediatric): Secondary | ICD-10-CM | POA: Diagnosis not present

## 2018-05-04 LAB — BASIC METABOLIC PANEL
ANION GAP: 12 (ref 5–15)
BUN: 22 mg/dL (ref 8–23)
CALCIUM: 10.1 mg/dL (ref 8.9–10.3)
CHLORIDE: 102 mmol/L (ref 98–111)
CO2: 24 mmol/L (ref 22–32)
CREATININE: 0.94 mg/dL (ref 0.44–1.00)
GFR calc non Af Amer: 58 mL/min — ABNORMAL LOW (ref >60)
Glucose, Bld: 235 mg/dL — ABNORMAL HIGH (ref 70–99)
Potassium: 4.1 mmol/L (ref 3.5–5.1)
SODIUM: 138 mmol/L (ref 135–145)

## 2018-05-04 LAB — PROTIME-INR
INR: 2.05
PROTHROMBIN TIME: 23 s — AB (ref 11.4–15.2)

## 2018-05-04 LAB — MAGNESIUM: MAGNESIUM: 1.9 mg/dL (ref 1.7–2.4)

## 2018-05-04 NOTE — Patient Instructions (Addendum)
You will need weekly INR checks for 3 more weeks after this week.  Have your INR checks faxed to [phone]

## 2018-05-04 NOTE — Progress Notes (Signed)
Primary Care Physician: Angelica Chessman, MD Referring Physician: Dr. Loren Racer Shultis is a 77 y.o. female with a h/o afib on tikosyn. He is in the afib clinic for return of afib a few days ago. She was going to have a cardioversion with Dr. Johney Frame in May but she returned to SR on her on. She is on coumadin and will need 4 therapeutic INR's before proceeding to cardioversion. No known trigger.   Today, she denies symptoms of chest pain, shortness of breath, orthopnea, PND, lower extremity edema, dizziness, presyncope, syncope, or neurologic sequela. + for palpitations. The patient is tolerating medications without difficulties and is otherwise without complaint today.   Past Medical History:  Diagnosis Date  . Anxiety   . Chronic insomnia   . Hyperlipidemia   . Hypertension   . Hypertriglyceridemia   . Obesity   . OSA on CPAP   . Persistent atrial fibrillation (HCC) 11/07/14   Chads2vasc score of at least 4  . Type II diabetes mellitus (HCC)   . 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 Central jet. Aortic Valve appears to be mildly sclerotic. Trace aortic regurgitation.EF >55%   Past Surgical History:  Procedure Laterality Date  . APPENDECTOMY  1971  . CARDIOVERSION N/A 06/23/2013   Procedure: CARDIOVERSION;  Surgeon: Lennette Bihari, MD;  Location: [facility] ENDOSCOPY;  Service: Cardiovascular;  Laterality: N/A;  . CATARACT EXTRACTION W/ INTRAOCULAR LENS IMPLANT Bilateral   . CESAREAN SECTION  1971  . PLACEMENT OF BREAST IMPLANTS Bilateral 1985    Current Outpatient Medications  Medication Sig Dispense Refill  . ACCU-CHEK COMPACT PLUS test strip CHECK BLOOD SUGAR TWICE DAILY OR AS DIRECTED  0  . Ascorbic Acid (VITAMIN C) 1000 MG tablet Take 3,000 mg by mouth daily.     . Cholecalciferol (VITAMIN D-3 PO) Take 1,000 mg by mouth daily.     Marland Kitchen COUMADIN 5 MG tablet Take 0.5-1 tablet daily. See admin instructions 30 tablet 6  .  diltiazem (CARDIZEM CD) 300 MG 24 hr capsule 1 capsule daily.    Marland Kitchen dofetilide (TIKOSYN) 250 MCG capsule TAKE ONE CAPSULE BY MOUTH EVERY 12 HOURS 60 capsule 11  . ezetimibe (ZETIA) 10 MG tablet Take 10 mg by mouth at bedtime.     . folic acid (FOLVITE) 1 MG tablet Take 1 mg by mouth daily.    . furosemide (LASIX) 20 MG tablet Take 20 mg by mouth daily.  1  . glipiZIDE (GLUCOTROL) 10 MG tablet Take 10 mg by mouth daily.    Marland Kitchen LANTUS SOLOSTAR 100 UNIT/ML Solostar Pen Inject 50 Units into the skin daily.     Marland Kitchen LORazepam (ATIVAN) 0.5 MG tablet Take 0.5 mg by mouth daily as needed for anxiety.    Marland Kitchen losartan (COZAAR) 100 MG tablet 1 tablet daily.  0  . magnesium oxide (MAG-OX) 400 MG tablet Take 1 tablet (400 mg total) by mouth daily. 30 tablet 5  . Melatonin 3 MG TABS Take 1 tablet by mouth at bedtime.    . metFORMIN (GLUCOPHAGE) 1000 MG tablet Take 1,000 mg by mouth 2 (two) times daily with a meal.    . metoprolol succinate (TOPROL-XL) 50 MG 24 hr tablet Take 50 mg by mouth 2 (two) times daily.   0  . omega-3 acid ethyl esters (LOVAZA) 1 G capsule Take 1 g by mouth daily.     . potassium chloride (K-DUR) 10 MEQ tablet Take 1 tablet (  10 mEq total) by mouth daily. (Patient taking differently: Take 10 mEq by mouth every other day. ) 90 tablet 2  . sertraline (ZOLOFT) 100 MG tablet 1 tablet.    . vitamin B-12 (CYANOCOBALAMIN) 100 MCG tablet Take 50 mcg by mouth daily.     Marland Kitchen. zolpidem (AMBIEN CR) 6.25 MG CR tablet Take 6.25 mg by mouth at bedtime.     . Choline Fenofibrate (TRILIPIX) 135 MG capsule Take 135 mg by mouth daily at 6 PM.      No current facility-administered medications for this encounter.     Allergies  Allergen Reactions  . Penicillins   . Rosuvastatin Calcium     myalgias Other reaction(s): Other (See Comments) myalgias myalgias myalgias myalgias   . Statins     myalgias Other reaction(s): Other (See Comments) myalgias myalgias myalgias   . Amoxicillin Rash  .  Atorvastatin Rash    Other reaction(s): Other (See Comments) unknow   . Ezetimibe-Simvastatin Rash    Other reaction(s): Other (See Comments) unknown     Social History   Socioeconomic History  . Marital status: Married    Spouse name: Not on file  . Number of children: Not on file  . Years of education: Not on file  . Highest education level: Not on file  Occupational History  . Not on file  Social Needs  . Financial resource strain: Not on file  . Food insecurity:    Worry: Not on file    Inability: Not on file  . Transportation needs:    Medical: Not on file    Non-medical: Not on file  Tobacco Use  . Smoking status: Passive Smoke Exposure - Never Smoker  . Smokeless tobacco: Never Used  Substance and Sexual Activity  . Alcohol use: Yes    Comment: 08/09/2013 "nothing to drink in >4 yrs; never had a problem w/it"  . Drug use: No  . Sexual activity: Not Currently  Lifestyle  . Physical activity:    Days per week: Not on file    Minutes per session: Not on file  . Stress: Not on file  Relationships  . Social connections:    Talks on phone: Not on file    Gets together: Not on file    Attends religious service: Not on file    Active member of club or organization: Not on file    Attends meetings of clubs or organizations: Not on file    Relationship status: Not on file  . Intimate partner violence:    Fear of current or ex partner: Not on file    Emotionally abused: Not on file    Physically abused: Not on file    Forced sexual activity: Not on file  Other Topics Concern  . Not on file  Social History Narrative  . Not on file    Family History  Problem Relation Age of Onset  . Heart attack Mother   . Cardiomyopathy Mother   . Cancer - Lung Father   . Cancer - Other Maternal Grandmother   . Stroke Paternal Grandfather   . Diabetes Maternal Grandfather     ROS- All systems are reviewed and negative except as per the HPI above  Physical  Exam: Vitals:   05/04/18 1429  BP: (!) 166/96  Pulse: (!) 107  Weight: 111.6 kg  Height: 5\' 5"  (1.651 m)   Wt Readings from Last 3 Encounters:  05/04/18 111.6 kg  12/21/17 110.7 kg  05/29/17 110.6  kg    Labs: Lab Results  Component Value Date   NA 140 12/21/2017   K 5.1 12/21/2017   CL 102 12/21/2017   CO2 21 12/21/2017   GLUCOSE 164 (H) 12/21/2017   BUN 16 12/21/2017   CREATININE 0.83 12/21/2017   CALCIUM 10.1 12/21/2017   MG 1.7 12/21/2017   Lab Results  Component Value Date   INR 2.05 05/04/2018   Lab Results  Component Value Date   CHOL 232 (H) 03/23/2013   HDL 42 03/23/2013   LDLCALC NOT CALC 03/23/2013   TRIG 486 (H) 03/23/2013     GEN- The patient is well appearing, alert and oriented x 3 today.   Head- normocephalic, atraumatic Eyes-  Sclera clear, conjunctiva pink Ears- hearing intact Oropharynx- clear Neck- supple, no JVP Lymph- no cervical lymphadenopathy Lungs- Clear to ausculation bilaterally, normal work of breathing Heart- irregular rate and rhythm, no murmurs, rubs or gallops, PMI not laterally displaced GI- soft, NT, ND, + BS Extremities- no clubbing, cyanosis, or edema MS- no significant deformity or atrophy Skin- no rash or lesion Psych- euthymic mood, full affect Neuro- strength and sensation are intact  EKG-atrial flutter at 107 bpm    Assessment and Plan:  1. Persistent atrial flutter Pt would like to proceed with cardioversion She is not interested in change in antiarrythmic or ablation at this point Continue Tikosyn 250 mg bid Will procedure with cardioversion after 4 therapeutic INR's INR, bmet/mag today  Will being back in 2 weeks given therapeutic INR's to get scheduled for cardioversion Will coordinate labs with her PCP who checks coumadin    Lupita Leash C[facility] [address] [place], [place] [address] [phone]

## 2018-05-04 NOTE — Telephone Encounter (Signed)
Pt returned this nurses call.  Pt in agreement to see Afib clinic today.  Pt with increased heart rates, sob and fatigue.  Will cont to monitor.

## 2018-05-04 NOTE — Telephone Encounter (Signed)
Left message on cell phone requesting call back.  Left message on Pt and husband cell phone.  Unable to get through on main phone.  Also sent message via MyChart.  Advised to call back ASAP to get Pt set up with Afib clinic.

## 2018-05-05 ENCOUNTER — Ambulatory Visit (HOSPITAL_COMMUNITY): Payer: Medicare Other | Admitting: Nurse Practitioner

## 2018-05-06 ENCOUNTER — Telehealth (HOSPITAL_COMMUNITY): Payer: Self-pay | Admitting: *Deleted

## 2018-05-06 NOTE — Telephone Encounter (Signed)
Pt cld stating that she went back into NSR yesterday and that she has given it the whole day and morning but she is still NSR in the 60s.   Pt is requesting we cancel 2 week f/u we had for scheduling dccv.  Pt will call with any changes

## 2018-05-19 ENCOUNTER — Ambulatory Visit (HOSPITAL_COMMUNITY): Payer: Medicare Other | Admitting: Nurse Practitioner

## 2018-07-05 ENCOUNTER — Telehealth: Payer: Self-pay | Admitting: Internal Medicine

## 2018-07-05 NOTE — Telephone Encounter (Signed)
Pt c/o Shortness Of Breath: STAT if SOB developed within the last 24 hours or pt is noticeably SOB on the phone  1. Are you currently SOB (can you hear that pt is SOB on the phone)? No  2. How long have you been experiencing SOB? A week   3. Are you SOB when sitting or when up moving around? No  4. Are you currently experiencing any other symptoms? Patient states she has been having A-fib symptoms for the past week. It has been as hight as 127 her pulse.  She refused an appt for this Friday with NP.

## 2018-07-06 ENCOUNTER — Telehealth: Payer: Self-pay | Admitting: Internal Medicine

## 2018-07-06 NOTE — Telephone Encounter (Signed)
Call transferred into triage  Pt in afib since the weekend. Gets very SOB with minimal exertion.  Has to go sit down and rest. HR in 120s. BP staying 140s/70s. Scheduled her in afib clinic tomorrow at 2:00 pm. She will continue resting this evening and tomorrow.  Aware of afib clinic phone number and parking code/directions. Cancelled appointment with APP on 07/09/18.

## 2018-07-06 NOTE — Telephone Encounter (Signed)
New Message        Patient states she is in A-fib and I got her an appt for Friday, just asking if this is ok.

## 2018-07-07 ENCOUNTER — Ambulatory Visit (HOSPITAL_COMMUNITY): Payer: Medicare Other | Admitting: Nurse Practitioner

## 2018-07-07 NOTE — Telephone Encounter (Signed)
Patient called in stating she returned to NSR this morning. Feeling better other than tired from not sleeping well last night. She will call if AF returns.

## 2018-07-09 ENCOUNTER — Ambulatory Visit: Payer: Medicare Other | Admitting: Nurse Practitioner

## 2018-07-12 ENCOUNTER — Telehealth (HOSPITAL_COMMUNITY): Payer: Self-pay | Admitting: *Deleted

## 2018-07-12 NOTE — Telephone Encounter (Signed)
Patient called in stating she is back in AF again. HR right around 100. Patient is very frustrated at the increased frequency of breakthrough. BP 138/70 - pt will try increasing metoprolol to 75mg  see if this will rate control her until appt on Thursday. Pt will call if any issues arise prior to this.

## 2018-07-15 ENCOUNTER — Ambulatory Visit (HOSPITAL_COMMUNITY)
Admission: RE | Admit: 2018-07-15 | Discharge: 2018-07-15 | Disposition: A | Discharge status: Home / Self Care | Payer: Medicare Other | Source: Ambulatory Visit | Attending: Nurse Practitioner | Admitting: Nurse Practitioner

## 2018-07-15 ENCOUNTER — Encounter (HOSPITAL_COMMUNITY): Payer: Self-pay | Admitting: Nurse Practitioner

## 2018-07-15 VITALS — BP 142/66 | HR 95 | Ht 65.0 in | Wt 249.0 lb

## 2018-07-15 DIAGNOSIS — Z7901 Long term (current) use of anticoagulants: Secondary | ICD-10-CM | POA: Insufficient documentation

## 2018-07-15 DIAGNOSIS — I10 Essential (primary) hypertension: Secondary | ICD-10-CM | POA: Diagnosis not present

## 2018-07-15 DIAGNOSIS — I4819 Other persistent atrial fibrillation: Secondary | ICD-10-CM | POA: Diagnosis not present

## 2018-07-15 DIAGNOSIS — I4892 Unspecified atrial flutter: Secondary | ICD-10-CM | POA: Diagnosis not present

## 2018-07-15 DIAGNOSIS — G4700 Insomnia, unspecified: Secondary | ICD-10-CM | POA: Diagnosis not present

## 2018-07-15 DIAGNOSIS — Z794 Long term (current) use of insulin: Secondary | ICD-10-CM | POA: Diagnosis not present

## 2018-07-15 DIAGNOSIS — E119 Type 2 diabetes mellitus without complications: Secondary | ICD-10-CM | POA: Insufficient documentation

## 2018-07-15 DIAGNOSIS — E781 Pure hyperglyceridemia: Secondary | ICD-10-CM | POA: Diagnosis not present

## 2018-07-15 DIAGNOSIS — Z7722 Contact with and (suspected) exposure to environmental tobacco smoke (acute) (chronic): Secondary | ICD-10-CM | POA: Insufficient documentation

## 2018-07-15 DIAGNOSIS — E669 Obesity, unspecified: Secondary | ICD-10-CM | POA: Insufficient documentation

## 2018-07-15 DIAGNOSIS — I4891 Unspecified atrial fibrillation: Secondary | ICD-10-CM | POA: Diagnosis not present

## 2018-07-15 DIAGNOSIS — G4733 Obstructive sleep apnea (adult) (pediatric): Secondary | ICD-10-CM | POA: Insufficient documentation

## 2018-07-15 DIAGNOSIS — Z8249 Family history of ischemic heart disease and other diseases of the circulatory system: Secondary | ICD-10-CM | POA: Insufficient documentation

## 2018-07-15 DIAGNOSIS — F419 Anxiety disorder, unspecified: Secondary | ICD-10-CM | POA: Insufficient documentation

## 2018-07-15 DIAGNOSIS — Z79899 Other long term (current) drug therapy: Secondary | ICD-10-CM | POA: Insufficient documentation

## 2018-07-15 DIAGNOSIS — I340 Nonrheumatic mitral (valve) insufficiency: Secondary | ICD-10-CM | POA: Diagnosis not present

## 2018-07-15 LAB — BASIC METABOLIC PANEL
Anion gap: 8 (ref 5–15)
BUN: 19 mg/dL (ref 8–23)
CHLORIDE: 104 mmol/L (ref 98–111)
CO2: 27 mmol/L (ref 22–32)
CREATININE: 0.88 mg/dL (ref 0.44–1.00)
Calcium: 10.1 mg/dL (ref 8.9–10.3)
GFR calc Af Amer: >60 mL/min (ref >60)
GFR calc non Af Amer: >60 mL/min (ref >60)
GLUCOSE: 199 mg/dL — AB (ref 70–99)
Potassium: 4.6 mmol/L (ref 3.5–5.1)
Sodium: 139 mmol/L (ref 135–145)

## 2018-07-15 LAB — MAGNESIUM: Magnesium: 1.9 mg/dL (ref 1.7–2.4)

## 2018-07-15 LAB — PROTIME-INR
INR: 2.45
Prothrombin Time: 26.2 seconds — ABNORMAL HIGH (ref 11.4–15.2)

## 2018-07-15 NOTE — Progress Notes (Signed)
Primary Care Physician: Angelica Chessman, MD Referring Physician: Dr. Loren Racer Bonet is a 77 y.o. female with a h/o afib on tikosyn. She is in the afib clinic for return of afib last week. Increase in BB has not encouraged return to SR. She was going to have a cardioversion with Dr. Johney Frame in April, but she returned to SR on her on. At  that visit, he discussed that amiodarone or ablation may be an option if Tikosyn did not maintain SR, however with  a left atrium of 51 mm, severely dilated,   I wonder how successful ablation would be. She would like to continue Tikosyn and proceed with cardioversion.  She is on coumadin and will need 4 therapeutic INR's before proceeding to cardioversion. No known trigger, other than obesity.    Today, she denies symptoms of chest pain, shortness of breath, orthopnea, PND, lower extremity edema, dizziness, presyncope, syncope, or neurologic sequela. + for palpitations. The patient is tolerating medications without difficulties and is otherwise without complaint today.   Past Medical History:  Diagnosis Date  . Anxiety   . Chronic insomnia   . Hyperlipidemia   . Hypertension   . Hypertriglyceridemia   . Obesity   . OSA on CPAP   . Persistent atrial fibrillation 11/07/14   Chads2vasc score of at least 4  . Type II diabetes mellitus (HCC)   . 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 Central jet. Aortic Valve appears to be mildly sclerotic. Trace aortic regurgitation.EF >55%   Past Surgical History:  Procedure Laterality Date  . APPENDECTOMY  1971  . CARDIOVERSION N/A 06/23/2013   Procedure: CARDIOVERSION;  Surgeon: Lennette Bihari, MD;  Location: [facility] ENDOSCOPY;  Service: Cardiovascular;  Laterality: N/A;  . CATARACT EXTRACTION W/ INTRAOCULAR LENS IMPLANT Bilateral   . CESAREAN SECTION  1971  . PLACEMENT OF BREAST IMPLANTS Bilateral 1985    Current Outpatient Medications  Medication Sig  Dispense Refill  . ACCU-CHEK COMPACT PLUS test strip CHECK BLOOD SUGAR TWICE DAILY OR AS DIRECTED  0  . Ascorbic Acid (VITAMIN C) 1000 MG tablet Take 3,000 mg by mouth daily.     . Cholecalciferol (VITAMIN D-3 PO) Take 1,000 mg by mouth daily.     . Choline Fenofibrate (TRILIPIX) 135 MG capsule Take 135 mg by mouth daily at 6 PM.     . COUMADIN 5 MG tablet Take 0.5-1 tablet daily. See admin instructions 30 tablet 6  . diltiazem (CARDIZEM CD) 300 MG 24 hr capsule 1 capsule daily.    Marland Kitchen dofetilide (TIKOSYN) 250 MCG capsule TAKE ONE CAPSULE BY MOUTH EVERY 12 HOURS 60 capsule 11  . ezetimibe (ZETIA) 10 MG tablet Take 10 mg by mouth at bedtime.     . folic acid (FOLVITE) 1 MG tablet Take 1 mg by mouth daily.    . furosemide (LASIX) 20 MG tablet Take 20 mg by mouth as needed.   1  . glipiZIDE (GLUCOTROL) 10 MG tablet Take 10 mg by mouth daily.    Marland Kitchen LANTUS SOLOSTAR 100 UNIT/ML Solostar Pen Inject 50 Units into the skin daily.     Marland Kitchen LORazepam (ATIVAN) 0.5 MG tablet Take 0.5 mg by mouth daily as needed for anxiety.    Marland Kitchen losartan (COZAAR) 100 MG tablet 1 tablet daily.  0  . magnesium oxide (MAG-OX) 400 MG tablet Take 1 tablet (400 mg total) by mouth daily. 30 tablet 5  . Melatonin 3 MG  TABS Take 1 tablet by mouth at bedtime.    . metFORMIN (GLUCOPHAGE) 1000 MG tablet Take 1,000 mg by mouth 2 (two) times daily with a meal.    . metoprolol succinate (TOPROL-XL) 50 MG 24 hr tablet Take 50 mg by mouth 2 (two) times daily.   0  . omega-3 acid ethyl esters (LOVAZA) 1 G capsule Take 1 g by mouth daily.     . potassium chloride (K-DUR) 10 MEQ tablet Take 1 tablet (10 mEq total) by mouth daily. (Patient taking differently: Take 10 mEq by mouth every other day. ) 90 tablet 2  . sertraline (ZOLOFT) 100 MG tablet 1 tablet.    . vitamin B-12 (CYANOCOBALAMIN) 100 MCG tablet Take 50 mcg by mouth daily.     Marland Kitchen zolpidem (AMBIEN CR) 6.25 MG CR tablet Take 6.25 mg by mouth at bedtime.      No current facility-administered  medications for this encounter.     Allergies  Allergen Reactions  . Penicillins   . Rosuvastatin Calcium     myalgias Other reaction(s): Other (See Comments) myalgias myalgias myalgias myalgias   . Statins     myalgias Other reaction(s): Other (See Comments) myalgias myalgias myalgias   . Amoxicillin Rash  . Atorvastatin Rash    Other reaction(s): Other (See Comments) unknow   . Ezetimibe-Simvastatin Rash    Other reaction(s): Other (See Comments) unknown     Social History   Socioeconomic History  . Marital status: Married    Spouse name: Not on file  . Number of children: Not on file  . Years of education: Not on file  . Highest education level: Not on file  Occupational History  . Not on file  Social Needs  . Financial resource strain: Not on file  . Food insecurity:    Worry: Not on file    Inability: Not on file  . Transportation needs:    Medical: Not on file    Non-medical: Not on file  Tobacco Use  . Smoking status: Passive Smoke Exposure - Never Smoker  . Smokeless tobacco: Never Used  Substance and Sexual Activity  . Alcohol use: Yes    Comment: 08/09/2013 "nothing to drink in >4 yrs; never had a problem w/it"  . Drug use: No  . Sexual activity: Not Currently  Lifestyle  . Physical activity:    Days per week: Not on file    Minutes per session: Not on file  . Stress: Not on file  Relationships  . Social connections:    Talks on phone: Not on file    Gets together: Not on file    Attends religious service: Not on file    Active member of club or organization: Not on file    Attends meetings of clubs or organizations: Not on file    Relationship status: Not on file  . Intimate partner violence:    Fear of current or ex partner: Not on file    Emotionally abused: Not on file    Physically abused: Not on file    Forced sexual activity: Not on file  Other Topics Concern  . Not on file  Social History Narrative  . Not on file     Family History  Problem Relation Age of Onset  . Heart attack Mother   . Cardiomyopathy Mother   . Cancer - Lung Father   . Cancer - Other Maternal Grandmother   . Stroke Paternal Grandfather   . Diabetes Maternal  Grandfather     ROS- All systems are reviewed and negative except as per the HPI above  Physical Exam: Vitals:   07/15/18 1330  BP: (!) 142/66  Pulse: 95  SpO2: 94%  Weight: 112.9 kg  Height: 5\' 5"  (1.651 m)   Wt Readings from Last 3 Encounters:  07/15/18 112.9 kg  05/04/18 111.6 kg  12/21/17 110.7 kg    Labs: Lab Results  Component Value Date   NA 138 05/04/2018   K 4.1 05/04/2018   CL 102 05/04/2018   CO2 24 05/04/2018   GLUCOSE 235 (H) 05/04/2018   BUN 22 05/04/2018   CREATININE 0.94 05/04/2018   CALCIUM 10.1 05/04/2018   MG 1.9 05/04/2018   Lab Results  Component Value Date   INR 2.05 05/04/2018   Lab Results  Component Value Date   CHOL 232 (H) 03/23/2013   HDL 42 03/23/2013   LDLCALC NOT CALC 03/23/2013   TRIG 486 (H) 03/23/2013     GEN- The patient is well appearing, alert and oriented x 3 today.   Head- normocephalic, atraumatic Eyes-  Sclera clear, conjunctiva pink Ears- hearing intact Oropharynx- clear Neck- supple, no JVP Lymph- no cervical lymphadenopathy Lungs- Clear to ausculation bilaterally, normal work of breathing Heart- irregular rate and rhythm, no murmurs, rubs or gallops, PMI not laterally displaced GI- soft, NT, ND, + BS Extremities- no clubbing, cyanosis, or edema MS- no significant deformity or atrophy Skin- no rash or lesion Psych- euthymic mood, full affect Neuro- strength and sensation are intact  EKG-afib at 95 bpm, qrs int 80 ms, qtc 402 ms     Assessment and Plan:  1. Persistent atrial flutter Out of rhythm x one week Pt would like to proceed with cardioversion She is not interested in change in antiarrythmic or ablation at this point Continue Tikosyn 250 mg bid Continue metoprolol succinate  75 mg bid Will procedure with cardioversion after 4 therapeutic INR's INR, bmet/mag/INR today  Will coordinate INR's with her PCP who checks coumadin  After 3 therapeutic INR's will get her back in to schedule procedure, she is to call the office if she goes back in , as this has happened several times in the past  Lupita Leash C[facility] [address] [place], [place] [address] [phone]

## 2018-07-19 ENCOUNTER — Telehealth (HOSPITAL_COMMUNITY): Payer: Self-pay | Admitting: *Deleted

## 2018-07-19 ENCOUNTER — Telehealth: Payer: Self-pay | Admitting: Internal Medicine

## 2018-07-19 NOTE — Telephone Encounter (Signed)
Pt called in stating she converted back to NSR over the weekend. Her HR is currently in the 60s feeling much better. She does report feeling very dizzy/faint with conversion this time. Pt will call if further extended breakthrough before follow up with Dr. Johney Frame in the spring.

## 2018-07-19 NOTE — Telephone Encounter (Signed)
Cardiology moonlighter note  Return page from patient.  She states that she spontaneously converted from atrial fibrillation to normal sinus rhythm yesterday.  Her heart rates in sinus rhythm have been between 50 and 70.  She has continued to take her Tikosyn twice daily as prescribed.  She is calling tonight to ask whether she should continue taking her Tikosyn.  She has had no symptoms of palpitations, chest pain, chest pressure, shortness of breath, lightheadedness, dizziness, syncope, presyncope.  I recommended that the patient continue taking her Tikosyn as prescribed.  She is also taking metoprolol 75 mg twice daily.  She will continue taking this for now and continue to monitor her heart rates.  If her heart rates remain in the 50s or if she develops any symptoms of bradycardia, she understands she should call back for further instruction.  A copy of this note will be forwarded to the patient's outpatient electrophysiologist, Dr. Johney Frame.   Rosario Jacks, MD Cardiology Fellow, PGY-6

## 2018-11-15 ENCOUNTER — Ambulatory Visit: Payer: Medicare Other | Admitting: Internal Medicine

## 2018-11-16 ENCOUNTER — Encounter: Payer: Self-pay | Admitting: Internal Medicine

## 2018-12-22 ENCOUNTER — Telehealth: Payer: Self-pay | Admitting: Internal Medicine

## 2018-12-22 NOTE — Telephone Encounter (Signed)
New message   [facility] asking patient to call back about appt on 04.20.20 with Dr. Johney Frame. Will make sure patient is aware it's on Mychart and let her know the instructions.

## 2018-12-24 ENCOUNTER — Telehealth: Payer: Self-pay

## 2018-12-24 NOTE — Telephone Encounter (Signed)
Follow Up:   Pt called and said she did not want any type of visit at this time.

## 2018-12-24 NOTE — Telephone Encounter (Signed)
Left message regarding appt on 12/27/18. 

## 2018-12-27 ENCOUNTER — Ambulatory Visit: Payer: Medicare Other | Admitting: Internal Medicine

## 2019-01-07 ENCOUNTER — Other Ambulatory Visit: Payer: Self-pay | Admitting: Internal Medicine

## 2019-01-07 NOTE — Telephone Encounter (Signed)
Pt's medication was sent to pt's pharmacy as requested. Confirmation received.  °

## 2019-04-01 ENCOUNTER — Other Ambulatory Visit: Payer: Self-pay | Admitting: Internal Medicine

## 2019-04-12 ENCOUNTER — Telehealth: Payer: Self-pay | Admitting: Internal Medicine

## 2019-04-12 NOTE — Telephone Encounter (Signed)
New Message   Pt c/o medication issue:  1. Name of Medication: Coumadin  2. How are you currently taking this medication (dosage and times per day)?   3. Are you having a reaction (difficulty breathing--STAT)?   4. What is your medication issue? Patient is calling because she was advised by the pharmacy that coumadin is no longer being manufactured. She is wanting to see about switching to Eliquis. Please call to discuss.

## 2019-04-12 NOTE — Telephone Encounter (Signed)
Lpm 8/4

## 2019-04-15 NOTE — Telephone Encounter (Signed)
Attempted to call Pt but her phone blocked my call and I was unable to leave a message.  Sent [employer].  Pt has appt with Dr. Rayann Heman on 04/25/19.  Pt can discuss medication change at that visit.

## 2019-04-25 ENCOUNTER — Telehealth: Payer: Medicare Other | Admitting: Internal Medicine

## 2019-04-25 ENCOUNTER — Telehealth: Payer: Self-pay | Admitting: Internal Medicine

## 2019-04-25 NOTE — Telephone Encounter (Signed)
Patient interested in switching to Eliquis. Her coumadin is managed by PCP. She will have her PCP check INR and have them send [place] results. We will prescribe Elqiuis and give her instructions on how to change. Patient really wanted in person visit with Dr. Rayann Heman. Expained that he isnt seeing people in person, just virtual right now, but that she could still get a lot of good information from him and if he felt she needed to be seen in person he would see her. I also suggested she follow up with afib clinic, but she did not want to.

## 2019-04-25 NOTE — Telephone Encounter (Signed)
  Patient would like to speak to the nurse in regards to her coumadin. She is almost out and she can no longer get the coumadin. Can leave detailed message if she doesn't answer

## 2019-05-04 ENCOUNTER — Other Ambulatory Visit: Payer: Self-pay | Admitting: Internal Medicine

## 2019-05-16 ENCOUNTER — Telehealth: Payer: Self-pay | Admitting: Medical

## 2019-05-16 MED ORDER — DOFETILIDE 250 MCG PO CAPS: 250.0000 ug | ORAL_CAPSULE | Freq: Two times a day (BID) | ORAL | 2 refills | Status: DC

## 2019-05-16 NOTE — Telephone Encounter (Signed)
   Patient called requesting a refill of her Tikosyn. Refill sent to her preferred pharmacy. Patient recommended to contact our office to arrange a follow-up visit as she missed her visit in August 2020. Patient agrees to contact our office soon. She had no other concerns at this time.  Abigail Butts, PA-C 05/16/19; 11:24 AM

## 2019-07-03 ENCOUNTER — Emergency Department (HOSPITAL_COMMUNITY)
Admission: EM | Admit: 2019-07-03 | Discharge: 2019-07-03 | Disposition: A | Discharge status: Home / Self Care | Payer: Medicare Other | Attending: Emergency Medicine | Admitting: Emergency Medicine

## 2019-07-03 ENCOUNTER — Other Ambulatory Visit: Payer: Self-pay

## 2019-07-03 DIAGNOSIS — Z7722 Contact with and (suspected) exposure to environmental tobacco smoke (acute) (chronic): Secondary | ICD-10-CM | POA: Diagnosis not present

## 2019-07-03 DIAGNOSIS — Z7984 Long term (current) use of oral hypoglycemic drugs: Secondary | ICD-10-CM | POA: Insufficient documentation

## 2019-07-03 DIAGNOSIS — Z7901 Long term (current) use of anticoagulants: Secondary | ICD-10-CM | POA: Insufficient documentation

## 2019-07-03 DIAGNOSIS — Z79899 Other long term (current) drug therapy: Secondary | ICD-10-CM | POA: Insufficient documentation

## 2019-07-03 DIAGNOSIS — E119 Type 2 diabetes mellitus without complications: Secondary | ICD-10-CM | POA: Insufficient documentation

## 2019-07-03 DIAGNOSIS — R040 Epistaxis: Secondary | ICD-10-CM | POA: Insufficient documentation

## 2019-07-03 DIAGNOSIS — I10 Essential (primary) hypertension: Secondary | ICD-10-CM | POA: Diagnosis not present

## 2019-07-03 DIAGNOSIS — I4819 Other persistent atrial fibrillation: Secondary | ICD-10-CM | POA: Insufficient documentation

## 2019-07-03 LAB — BASIC METABOLIC PANEL
Anion gap: 10 (ref 5–15)
BUN: 15 mg/dL (ref 8–23)
CO2: 24 mmol/L (ref 22–32)
Calcium: 9.5 mg/dL (ref 8.9–10.3)
Chloride: 100 mmol/L (ref 98–111)
Creatinine, Ser: 0.83 mg/dL (ref 0.44–1.00)
GFR calc Af Amer: >60 mL/min (ref >60)
GFR calc non Af Amer: >60 mL/min (ref >60)
Glucose, Bld: 199 mg/dL — ABNORMAL HIGH (ref 70–99)
Potassium: 4.3 mmol/L (ref 3.5–5.1)
Sodium: 134 mmol/L — ABNORMAL LOW (ref 135–145)

## 2019-07-03 LAB — CBC
HCT: 39.2 % (ref 36.0–46.0)
Hemoglobin: 12.6 g/dL (ref 12.0–15.0)
MCH: 25.4 pg — ABNORMAL LOW (ref 26.0–34.0)
MCHC: 32.1 g/dL (ref 30.0–36.0)
MCV: 78.9 fL — ABNORMAL LOW (ref 80.0–100.0)
Platelets: 426 10*3/uL — ABNORMAL HIGH (ref 150–400)
RBC: 4.97 MIL/uL (ref 3.87–5.11)
RDW: 15.8 % — ABNORMAL HIGH (ref 11.5–15.5)
WBC: 13.2 10*3/uL — ABNORMAL HIGH (ref 4.0–10.5)
nRBC: 0 % (ref 0.0–0.2)

## 2019-07-03 LAB — PROTIME-INR
INR: 2.5 — ABNORMAL HIGH (ref 0.8–1.2)
Prothrombin Time: 26.7 seconds — ABNORMAL HIGH (ref 11.4–15.2)

## 2019-07-03 MED ORDER — TRANEXAMIC ACID 1000 MG/10ML IV SOLN
500.0000 mg | Freq: Once | INTRAVENOUS | Status: AC
  Administered 2019-07-03: 500 mg via TOPICAL
  Filled 2019-07-03: qty 10

## 2019-07-03 MED ORDER — COCAINE HCL 4 % EX SOLN
4.0000 mL | Freq: Once | CUTANEOUS | Status: AC
  Administered 2019-07-03: 4 mL via TOPICAL
  Filled 2019-07-03: qty 4

## 2019-07-03 MED ORDER — LABETALOL HCL 5 MG/ML IV SOLN
20.0000 mg | Freq: Once | INTRAVENOUS | Status: AC
  Administered 2019-07-03: 20 mg via INTRAVENOUS
  Filled 2019-07-03: qty 4

## 2019-07-03 NOTE — ED Notes (Signed)
Patient verbalizes understanding of discharge instructions. Opportunity for questioning and answers were provided. Armband removed by staff, pt discharged from ED ambulatory.   

## 2019-07-03 NOTE — Discharge Instructions (Addendum)
Do not take your Coumadin today.  If the bleeding starts again, pinch the nose and hold pressure for 15 minutes without letting up.  If bleeding does not stop, return to the ER.

## 2019-07-03 NOTE — ED Provider Notes (Signed)
[facility] EMERGENCY DEPARTMENT Provider Note   CSN: [id number] Arrival date & time: 07/03/19  0144     History   Chief Complaint Chief Complaint  Patient presents with  . Epistaxis    HPI Amy Wright is a 78 y.o. female.     Patient presents to the emergency department for evaluation of nosebleed.  Patient reports that she was at home when her nose started spontaneous bleeding.  She reports that at first it bled out of the left side, but then she started to notice bleeding out of both sides of her nose and down the back of her throat.  She has held some pressure on her nose but has not stopped bleeding.  She does take Coumadin for history of atrial fibrillation.  She denies trauma.  No headache or blurred vision.  She has never had a previous nosebleed.     Past Medical History:  Diagnosis Date  . Anxiety   . Chronic insomnia   . Hyperlipidemia   . Hypertension   . Hypertriglyceridemia   . Obesity   . OSA on CPAP   . Persistent atrial fibrillation 11/07/14   Chads2vasc score of at least 4  . Type II diabetes mellitus ([place])   . 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 Central jet. Aortic Valve appears to be mildly sclerotic. Trace aortic regurgitation.EF >55%    Patient Active Problem List   Diagnosis Date Noted  . Breast pain, left 12/03/2016  . Hx of long term use of blood thinners 04/01/2016  . Uncontrolled type 2 diabetes mellitus with hyperglycemia ([place]) 11/26/2015  . Microscopic hematuria 09/19/2015  . Left foot pain 08/22/2015  . Obesity 02/15/2015  . Frequency of urination 06/13/2014  . 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  . Depression 03/22/2014  . Insomnia 03/22/2014  . Vaginitis 03/22/2014  . Hyperlipemia 03/22/2014  . Hypertriglyceridemia 03/22/2014  . Atrial fibrillation ([place]) 08/09/2013  . Obstructive sleep apnea syndrome 08/07/2013  . Persistent atrial fibrillation ([place]) 03/29/2013  .  Anticoagulated on Coumadin 03/29/2013  . DM2 (diabetes mellitus, type 2) ([place]) 03/29/2013  . Mixed hyperlipidemia 03/29/2013  . Essential hypertension 03/29/2013    Past Surgical History:  Procedure Laterality Date  . APPENDECTOMY  1971  . CARDIOVERSION N/A 06/23/2013   Procedure: CARDIOVERSION;  Surgeon: Troy Sine, MD;  Location: [place];  Service: Cardiovascular;  Laterality: N/A;  . CATARACT EXTRACTION W/ INTRAOCULAR LENS IMPLANT Bilateral   . [place]  . PLACEMENT OF BREAST IMPLANTS Bilateral 1985     OB History   No obstetric history on file.      Home Medications    Prior to Admission medications   Medication Sig Start Date End Date Taking? Authorizing Provider  ACCU-CHEK COMPACT PLUS test strip CHECK BLOOD SUGAR TWICE DAILY OR AS DIRECTED 02/27/16  Yes [provider]  Ascorbic Acid (VITAMIN C) 1000 MG tablet Take 3,000 mg by mouth daily.    Yes [provider]  Cholecalciferol (VITAMIN D-3 PO) Take 1,000 mg by mouth daily.    Yes [provider]  Choline Fenofibrate (TRILIPIX) 135 MG capsule Take 135 mg by mouth daily at 6 PM.    Yes [provider]  COUMADIN 5 MG tablet Take 0.5-1 tablet daily. See admin instructions 02/09/15  Yes Sherran Needs, NP  diltiazem (CARDIZEM CD) 300 MG 24 hr capsule 1 capsule daily. 04/28/17  Yes [provider]  dofetilide (TIKOSYN) 250 MCG  capsule Take 1 capsule (250 mcg total) by mouth 2 (two) times daily. Please keep upcoming appt for future refills. Thank you 05/16/19  Yes Kroeger, Dot Lanes M., PA-C  ezetimibe (ZETIA) 10 MG tablet Take 10 mg by mouth at bedtime.    Yes [provider]  folic acid (FOLVITE) 1 MG tablet Take 1 mg by mouth daily.   Yes [provider]  furosemide (LASIX) 20 MG tablet Take 20 mg by mouth as needed.  04/12/18  Yes [provider]  glipiZIDE (GLUCOTROL) 10 MG tablet Take 10 mg by mouth daily. 03/24/17  Yes [provider]  LANTUS SOLOSTAR 100 UNIT/ML Solostar Pen Inject 50 Units into the skin daily.  02/11/16  Yes [provider]  LORazepam (ATIVAN) 0.5 MG tablet Take 0.5 mg by mouth daily as needed for anxiety.   Yes [provider]  magnesium oxide (MAG-OX) 400 MG tablet Take 1 tablet (400 mg total) by mouth daily. 02/14/16  Yes Seiler, Amber K, NP  Melatonin 3 MG TABS Take 1 tablet by mouth at bedtime.   Yes [provider]  metFORMIN (GLUCOPHAGE) 1000 MG tablet Take 1,000 mg by mouth 2 (two) times daily with a meal.   Yes [provider]  metoprolol succinate (TOPROL-XL) 50 MG 24 hr tablet Take 75 mg by mouth 2 (two) times daily. 03/10/17  Yes [provider]  potassium chloride (K-DUR) 10 MEQ tablet Take 1 tablet (10 mEq total) by mouth daily. Patient taking differently: Take 10 mEq by mouth every other day.  02/26/18  Yes Allred, Fayrene Fearing, MD  sertraline (ZOLOFT) 100 MG tablet 1 tablet. 02/04/17  Yes [provider]  vitamin B-12 (CYANOCOBALAMIN) 100 MCG tablet Take 50 mcg by mouth daily.    Yes [provider]  zolpidem (AMBIEN CR) 6.25 MG CR tablet Take 6.25 mg by mouth at bedtime.  09/26/14  Yes [provider]  losartan (COZAAR) 100 MG tablet 1 tablet daily. 04/22/18   [provider]  omega-3 acid ethyl esters (LOVAZA) 1 G capsule Take 1 g by mouth daily.     [provider]    Family History Family History  Problem Relation Age of Onset  . Heart attack Mother   . Cardiomyopathy Mother   . Cancer - Lung Father   . Cancer - Other Maternal Grandmother   . Stroke Paternal Grandfather   . Diabetes Maternal Grandfather     Social History Social History   Tobacco Use  . Smoking status: Passive Smoke Exposure - Never Smoker  . Smokeless tobacco: Never Used  Substance Use Topics  . Alcohol use: Yes    Comment: 08/09/2013 "nothing to drink in >4 yrs; never had a problem w/it"  . Drug use: No      Allergies   Penicillins, Rosuvastatin calcium, Statins, Amoxicillin, Atorvastatin, and Ezetimibe-simvastatin   Review of Systems Review of Systems  HENT: Positive for nosebleeds.   All other systems reviewed and are negative.    Physical Exam Updated Vital Signs BP 123/80   Pulse (!) 106   Temp 98.2 F (36.8 C) (Oral)   Resp 14   Ht 5' 5.5" (1.664 m)   Wt 113.4 kg   SpO2 96%   BMI 40.97 kg/m   Physical Exam Vitals signs and nursing note reviewed.  Constitutional:      General: She is not in acute distress.    Appearance: Normal appearance. She is well-developed.  HENT:     Head:  Normocephalic and atraumatic.     Right Ear: Hearing normal.     Left Ear: Hearing normal.     Nose:     Left Nostril: Epistaxis present.     Comments: Brisk bleeding from left nare with clots present in nose as well as posterior oropharynx Eyes:     Conjunctiva/sclera: Conjunctivae normal.     Pupils: Pupils are equal, round, and reactive to light.  Neck:     Musculoskeletal: Normal range of motion and neck supple.  Cardiovascular:     Rate and Rhythm: Regular rhythm.     Heart sounds: S1 normal and S2 normal. No murmur. No friction rub. No gallop.   Pulmonary:     Effort: Pulmonary effort is normal. No respiratory distress.     Breath sounds: Normal breath sounds.  Chest:     Chest wall: No tenderness.  Abdominal:     General: Bowel sounds are normal.     Palpations: Abdomen is soft.     Tenderness: There is no abdominal tenderness. There is no guarding or rebound. Negative signs include Murphy's sign and McBurney's sign.     Hernia: No hernia is present.  Musculoskeletal: Normal range of motion.  Skin:    General: Skin is warm and dry.     Findings: No rash.  Neurological:     Mental Status: She is alert and oriented to person, place, and time.     GCS: GCS eye subscore is 4. GCS verbal subscore is 5. GCS motor subscore is 6.     Cranial Nerves: No cranial nerve deficit.      Sensory: No sensory deficit.     Coordination: Coordination normal.  Psychiatric:        Speech: Speech normal.        Behavior: Behavior normal.        Thought Content: Thought content normal.      ED Treatments / Results  Labs (all labs ordered are listed, but only abnormal results are displayed) Labs Reviewed  CBC - Abnormal; Notable for the following components:      Result Value   WBC 13.2 (*)    MCV 78.9 (*)    MCH 25.4 (*)    RDW 15.8 (*)    Platelets 426 (*)    All other components within normal limits  BASIC METABOLIC PANEL - Abnormal; Notable for the following components:   Sodium 134 (*)    Glucose, Bld 199 (*)    All other components within normal limits  PROTIME-INR - Abnormal; Notable for the following components:   Prothrombin Time 26.7 (*)    INR 2.5 (*)    All other components within normal limits    EKG None  Radiology No results found.  Procedures .Epistaxis Management  Date/Time: 07/03/2019 5:08 AM Performed by: Gilda Crease, MD Authorized by: Gilda Crease, MD   Consent:    Consent obtained:  Verbal   Consent given by:  Patient   Risks discussed:  Bleeding and pain Universal protocol:    Procedure explained and questions answered to patient or proxy's satisfaction: yes     Relevant documents present and verified: yes     Test results available and properly labeled: yes     Imaging studies available: yes     Required blood products, implants, devices, and special equipment available: yes     Site/side marked: yes     Time out called: yes     Patient identity  confirmed:  Verbally with patient Anesthesia (see MAR for exact dosages):    Anesthesia method:  Topical application   Topical anesthetic:  Cocaine Procedure details:    Treatment site:  L anterior   Repair method: TXA.   Treatment complexity:  Limited   Treatment episode: initial   Post-procedure details:    Assessment:  Bleeding stopped   Patient  tolerance of procedure:  Tolerated well, no immediate complications   (including critical care time)  Medications Ordered in ED Medications  tranexamic acid (CYKLOKAPRON) injection 500 mg (has no administration in time range)  cocaine 4 % topical solution 4 mL (has no administration in time range)  labetalol (NORMODYNE) injection 20 mg (20 mg Intravenous Given 07/03/19 0232)     Initial Impression / Assessment and Plan / ED Course  I have reviewed the triage vital signs and the nursing notes.  Pertinent labs & imaging results that were available during my care of the patient were reviewed by me and considered in my medical decision making (see chart for details).        Patient presented to the emergency department with brisk bleeding in the left nare.  Patient's blood pressure was very elevated at arrival.  I had the patient clear her nose of clots by blowing and when I reevaluated, there was pulsatile anterior bleed present.  Patient administered labetalol which aggressively lowered her blood pressure and then treated with cocaine followed by tranexamic acid.  Bleeding has completely stopped.  Patient monitored for an extended period of time with no rebleeding.  We will hold her Coumadin today, then restart.  Follow-up with ENT as needed.  Hold direct pressure for any further bleeding, return if it does not stop.  Final Clinical Impressions(s) / ED Diagnoses   Final diagnoses:  Left-sided epistaxis    ED Discharge Orders    None       Gilda CreasePollina, Glenda Spelman J, MD 07/03/19 [phone]

## 2019-07-03 NOTE — ED Triage Notes (Signed)
Pt brought in by EMS with nosebleed since 2330.  Pt is on blood thinners.  Afrin given by EMS with no relief.  Pt states she was only walking when it started

## 2019-07-05 ENCOUNTER — Telehealth: Payer: Self-pay | Admitting: Internal Medicine

## 2019-07-05 MED ORDER — APIXABAN 5 MG PO TABS: 5.0000 mg | ORAL_TABLET | Freq: Two times a day (BID) | ORAL | 5 refills | Status: DC

## 2019-07-05 NOTE — Telephone Encounter (Signed)
Spoke with patient. She did not take coumadin on Sunday night. Her INR in ED on Sunday AM was 2.5. She took 1/2 tablet yesterday. Advised patient not to take any more coumadin. Start taking Eliquis 5mg  BID tomorrow AM. Rx sent to [facility] per pt request

## 2019-07-05 NOTE — Telephone Encounter (Signed)
New message   Pt c/o medication issue:  1. Name of Medication: Eliquis   2. How are you currently taking this medication (dosage and times per day)? n/a  3. Are you having a reaction (difficulty breathing--STAT)?n/a  4. What is your medication issue?patient states that she can no longer get coumadin and needs a prescription for eliquis sent to [id number] - [place], [place] 90 day supply

## 2019-07-10 ENCOUNTER — Telehealth: Payer: Self-pay | Admitting: Student

## 2019-07-10 NOTE — Telephone Encounter (Signed)
   Patient called Answering Service about nose bleed. Patient had significant nose bleed last weekend and was treated with cocaine followed by tranexamic acid. Patient was on Coumadin for persistent atrial fibrillation. Since then, she has been switched to Eliquis. Patient states she was doing well but had another heavy nose bleed this morning. Nose bleed lasted about 30 minutes but patient was able to get it to stop by holding firm pressure. Patient calling to see if she should still take her Tikosyn and Eliquis. Explained that Tikosyn would not cause her to bleed. CHA2DS2-VASc at least 5 (HTN, DM, age x2, female). Explained that patient needs Eliquis to held reduce risk of having a stroke. Would continue Eliquis for now. Advised that if patient has another nose bleed, she should hold pressure again. However, if she is not able to get it to stop, she should come to the ED. Patient can also try normal saline spray to see if that helps prevent further nose bleeds. Patient voiced understanding and agreed.  Will route note to Dr. Rayann Heman so that he is aware.  Darreld Mclean, PA-C 07/10/2019 10:51 AM

## 2019-07-12 ENCOUNTER — Telehealth: Payer: Self-pay | Admitting: Cardiology

## 2019-07-12 NOTE — Telephone Encounter (Signed)
Ms.Benn was recently switched to apixaban from warfarin for stroke prevention in atrial fibrillation (CHADS-VASc score 5). She has had 3 nosebleeds since switching; last was this morning. She called to ask whether she should reduce tonight's dose. She meets indication for full dose apixaban with age <80, weight >60 kg, and GFR >30. I recommended taking full dose apixaban and using saline nasal spray to try to prevent nosebleeds. Reviewed holding pressure in case she gets another nosebleed and recommended that she come to the ED if she develops a nosebleed that does not stop with pressure.   Teresa Pelton, MD

## 2019-07-19 ENCOUNTER — Ambulatory Visit (INDEPENDENT_AMBULATORY_CARE_PROVIDER_SITE_OTHER): Payer: Medicare Other | Admitting: Otolaryngology

## 2019-07-19 ENCOUNTER — Telehealth: Payer: Self-pay | Admitting: Internal Medicine

## 2019-07-19 NOTE — Telephone Encounter (Addendum)
Patient called again. Would like a call back. She can be reached on her cell [phone]

## 2019-07-19 NOTE — Telephone Encounter (Signed)
  Patient would like to see if Dr Rayann Heman would refer her to an ENT because she is having frequent nose bleeds. She states she had 3 last week.

## 2019-07-20 ENCOUNTER — Other Ambulatory Visit: Payer: Self-pay | Admitting: Internal Medicine

## 2019-07-20 NOTE — Telephone Encounter (Signed)
Attempted outreach to home phone-this call was blocked.  Call placed to cell phone.  Left detailed message advising Pt has 3 options:  1.  Call PCP if she would like referral today 2.  Call afib clinic and see if they would put in referral 3.  This nurse will discuss with Dr. Rayann Amy Wright on Friday and put in referral if approved  Advised to call back if any further concerns

## 2019-07-20 NOTE — Telephone Encounter (Signed)
OK to refer to ENT. 

## 2019-07-22 NOTE — Telephone Encounter (Signed)
Left detailed  Message.  Advised ok per Dr. Rayann Heman to place ENT referral.  Advised Pt to call office if she would like for Dr. Rayann Heman to place referral.  Per review of Pt's chart, she had appt with ENT on 11/10 which she cancelled.  Appears Pt did get referral.  Await further needs.

## 2019-07-25 ENCOUNTER — Other Ambulatory Visit: Payer: Self-pay

## 2019-07-25 ENCOUNTER — Encounter (HOSPITAL_COMMUNITY): Payer: Self-pay

## 2019-07-25 ENCOUNTER — Inpatient Hospital Stay (HOSPITAL_COMMUNITY)
Admission: EM | Admit: 2019-07-25 | Discharge: 2019-07-27 | DRG: 310 | Disposition: A | Discharge status: Home Health | Payer: Medicare Other | Attending: Internal Medicine | Admitting: Internal Medicine

## 2019-07-25 DIAGNOSIS — G4733 Obstructive sleep apnea (adult) (pediatric): Secondary | ICD-10-CM | POA: Diagnosis present

## 2019-07-25 DIAGNOSIS — I4891 Unspecified atrial fibrillation: Secondary | ICD-10-CM | POA: Diagnosis not present

## 2019-07-25 DIAGNOSIS — M25572 Pain in left ankle and joints of left foot: Secondary | ICD-10-CM | POA: Diagnosis present

## 2019-07-25 DIAGNOSIS — Z880 Allergy status to penicillin: Secondary | ICD-10-CM

## 2019-07-25 DIAGNOSIS — Z6838 Body mass index (BMI) 38.0-38.9, adult: Secondary | ICD-10-CM

## 2019-07-25 DIAGNOSIS — Z79899 Other long term (current) drug therapy: Secondary | ICD-10-CM

## 2019-07-25 DIAGNOSIS — Z833 Family history of diabetes mellitus: Secondary | ICD-10-CM

## 2019-07-25 DIAGNOSIS — Z79890 Hormone replacement therapy (postmenopausal): Secondary | ICD-10-CM

## 2019-07-25 DIAGNOSIS — E669 Obesity, unspecified: Secondary | ICD-10-CM | POA: Diagnosis present

## 2019-07-25 DIAGNOSIS — Z794 Long term (current) use of insulin: Secondary | ICD-10-CM

## 2019-07-25 DIAGNOSIS — F5104 Psychophysiologic insomnia: Secondary | ICD-10-CM | POA: Diagnosis present

## 2019-07-25 DIAGNOSIS — Z888 Allergy status to other drugs, medicaments and biological substances status: Secondary | ICD-10-CM

## 2019-07-25 DIAGNOSIS — W19XXXA Unspecified fall, initial encounter: Secondary | ICD-10-CM

## 2019-07-25 DIAGNOSIS — Z8249 Family history of ischemic heart disease and other diseases of the circulatory system: Secondary | ICD-10-CM

## 2019-07-25 DIAGNOSIS — I10 Essential (primary) hypertension: Secondary | ICD-10-CM | POA: Diagnosis present

## 2019-07-25 DIAGNOSIS — E119 Type 2 diabetes mellitus without complications: Secondary | ICD-10-CM | POA: Diagnosis present

## 2019-07-25 DIAGNOSIS — Z20828 Contact with and (suspected) exposure to other viral communicable diseases: Secondary | ICD-10-CM | POA: Diagnosis present

## 2019-07-25 DIAGNOSIS — E782 Mixed hyperlipidemia: Secondary | ICD-10-CM | POA: Diagnosis present

## 2019-07-25 DIAGNOSIS — I4819 Other persistent atrial fibrillation: Principal | ICD-10-CM | POA: Diagnosis present

## 2019-07-25 DIAGNOSIS — D62 Acute posthemorrhagic anemia: Secondary | ICD-10-CM | POA: Diagnosis present

## 2019-07-25 DIAGNOSIS — M542 Cervicalgia: Secondary | ICD-10-CM | POA: Diagnosis present

## 2019-07-25 DIAGNOSIS — Z7901 Long term (current) use of anticoagulants: Secondary | ICD-10-CM

## 2019-07-25 DIAGNOSIS — R040 Epistaxis: Secondary | ICD-10-CM | POA: Diagnosis present

## 2019-07-25 DIAGNOSIS — E781 Pure hyperglyceridemia: Secondary | ICD-10-CM | POA: Diagnosis present

## 2019-07-25 DIAGNOSIS — F419 Anxiety disorder, unspecified: Secondary | ICD-10-CM | POA: Diagnosis present

## 2019-07-25 DIAGNOSIS — E1165 Type 2 diabetes mellitus with hyperglycemia: Secondary | ICD-10-CM | POA: Diagnosis present

## 2019-07-25 LAB — COMPREHENSIVE METABOLIC PANEL
ALT: 17 U/L (ref 0–44)
AST: 29 U/L (ref 15–41)
Albumin: 3.5 g/dL (ref 3.5–5.0)
Alkaline Phosphatase: 45 U/L (ref 38–126)
Anion gap: 16 — ABNORMAL HIGH (ref 5–15)
BUN: 17 mg/dL (ref 8–23)
CO2: 20 mmol/L — ABNORMAL LOW (ref 22–32)
Calcium: 9.9 mg/dL (ref 8.9–10.3)
Chloride: 103 mmol/L (ref 98–111)
Creatinine, Ser: 0.99 mg/dL (ref 0.44–1.00)
GFR calc Af Amer: >60 mL/min (ref >60)
GFR calc non Af Amer: 55 mL/min — ABNORMAL LOW (ref >60)
Glucose, Bld: 315 mg/dL — ABNORMAL HIGH (ref 70–99)
Potassium: 4.6 mmol/L (ref 3.5–5.1)
Sodium: 139 mmol/L (ref 135–145)
Total Bilirubin: 0.5 mg/dL (ref 0.3–1.2)
Total Protein: 7.1 g/dL (ref 6.5–8.1)

## 2019-07-25 LAB — CBC WITH DIFFERENTIAL/PLATELET
Abs Immature Granulocytes: 0.08 10*3/uL — ABNORMAL HIGH (ref 0.00–0.07)
Basophils Absolute: 0.1 10*3/uL (ref 0.0–0.1)
Basophils Relative: 1 %
Eosinophils Absolute: 0.1 10*3/uL (ref 0.0–0.5)
Eosinophils Relative: 0 %
HCT: 33.8 % — ABNORMAL LOW (ref 36.0–46.0)
Hemoglobin: 10.2 g/dL — ABNORMAL LOW (ref 12.0–15.0)
Immature Granulocytes: 1 %
Lymphocytes Relative: 16 %
Lymphs Abs: 2 10*3/uL (ref 0.7–4.0)
MCH: 25.1 pg — ABNORMAL LOW (ref 26.0–34.0)
MCHC: 30.2 g/dL (ref 30.0–36.0)
MCV: 83.3 fL (ref 80.0–100.0)
Monocytes Absolute: 0.8 10*3/uL (ref 0.1–1.0)
Monocytes Relative: 6 %
Neutro Abs: 9.8 10*3/uL — ABNORMAL HIGH (ref 1.7–7.7)
Neutrophils Relative %: 76 %
Platelets: 515 10*3/uL — ABNORMAL HIGH (ref 150–400)
RBC: 4.06 MIL/uL (ref 3.87–5.11)
RDW: 16.1 % — ABNORMAL HIGH (ref 11.5–15.5)
WBC: 12.8 10*3/uL — ABNORMAL HIGH (ref 4.0–10.5)
nRBC: 0.2 % (ref 0.0–0.2)

## 2019-07-25 LAB — TYPE AND SCREEN
ABO/RH(D): AB POS
Antibody Screen: NEGATIVE

## 2019-07-25 LAB — PROTIME-INR
INR: 1.3 — ABNORMAL HIGH (ref 0.8–1.2)
Prothrombin Time: 15.9 seconds — ABNORMAL HIGH (ref 11.4–15.2)

## 2019-07-25 LAB — CBG MONITORING, ED: Glucose-Capillary: 275 mg/dL — ABNORMAL HIGH (ref 70–99)

## 2019-07-25 LAB — TROPONIN I (HIGH SENSITIVITY)
Troponin I (High Sensitivity): 8 ng/L (ref <18)
Troponin I (High Sensitivity): 10 ng/L (ref <18)

## 2019-07-25 LAB — HEMOGLOBIN A1C
Hgb A1c MFr Bld: 8.5 % — ABNORMAL HIGH (ref 4.8–5.6)
Mean Plasma Glucose: 197.25 mg/dL

## 2019-07-25 LAB — ABO/RH: ABO/RH(D): AB POS

## 2019-07-25 MED ORDER — ZOLPIDEM TARTRATE 5 MG PO TABS
5.0000 mg | ORAL_TABLET | Freq: Every evening | ORAL | Status: DC | PRN
  Administered 2019-07-26: 5 mg via ORAL
  Filled 2019-07-25: qty 1

## 2019-07-25 MED ORDER — DILTIAZEM HCL 25 MG/5ML IV SOLN
25.0000 mg | Freq: Once | INTRAVENOUS | Status: AC
  Administered 2019-07-25: 25 mg via INTRAVENOUS
  Filled 2019-07-25: qty 5

## 2019-07-25 MED ORDER — FOLIC ACID 1 MG PO TABS
1.0000 mg | ORAL_TABLET | Freq: Every morning | ORAL | Status: DC
  Administered 2019-07-26 – 2019-07-27 (×2): 1 mg via ORAL
  Filled 2019-07-25 (×2): qty 1

## 2019-07-25 MED ORDER — LORAZEPAM 1 MG PO TABS
1.0000 mg | ORAL_TABLET | Freq: Once | ORAL | Status: AC
  Administered 2019-07-25: 1 mg via ORAL
  Filled 2019-07-25: qty 1

## 2019-07-25 MED ORDER — ONDANSETRON HCL 4 MG/2ML IJ SOLN
4.0000 mg | Freq: Once | INTRAMUSCULAR | Status: AC
  Administered 2019-07-25: 4 mg via INTRAVENOUS
  Filled 2019-07-25: qty 2

## 2019-07-25 MED ORDER — VITAMIN D 25 MCG (1000 UNIT) PO TABS
1000.0000 [IU] | ORAL_TABLET | Freq: Every day | ORAL | Status: DC
  Administered 2019-07-26 – 2019-07-27 (×2): 1000 [IU] via ORAL
  Filled 2019-07-25 (×2): qty 1

## 2019-07-25 MED ORDER — LORAZEPAM 0.5 MG PO TABS
0.5000 mg | ORAL_TABLET | Freq: Every day | ORAL | Status: DC | PRN
  Administered 2019-07-26: 0.5 mg via ORAL
  Filled 2019-07-25: qty 1

## 2019-07-25 MED ORDER — MAGNESIUM OXIDE 400 (241.3 MG) MG PO TABS
400.0000 mg | ORAL_TABLET | Freq: Every morning | ORAL | Status: DC
  Administered 2019-07-26 – 2019-07-27 (×2): 400 mg via ORAL
  Filled 2019-07-25 (×2): qty 1

## 2019-07-25 MED ORDER — SODIUM CHLORIDE 0.9 % IV BOLUS
1000.0000 mL | Freq: Once | INTRAVENOUS | Status: AC
  Administered 2019-07-25: 1000 mL via INTRAVENOUS

## 2019-07-25 MED ORDER — INSULIN ASPART 100 UNIT/ML ~~LOC~~ SOLN
0.0000 [IU] | Freq: Three times a day (TID) | SUBCUTANEOUS | Status: DC
  Administered 2019-07-26: 4 [IU] via SUBCUTANEOUS
  Administered 2019-07-26 (×2): 7 [IU] via SUBCUTANEOUS
  Administered 2019-07-27: 12:00:00 11 [IU] via SUBCUTANEOUS
  Administered 2019-07-27: 7 [IU] via SUBCUTANEOUS

## 2019-07-25 MED ORDER — VITAMIN B-12 100 MCG PO TABS
100.0000 ug | ORAL_TABLET | Freq: Every morning | ORAL | Status: DC
  Administered 2019-07-26 – 2019-07-27 (×2): 100 ug via ORAL
  Filled 2019-07-25 (×3): qty 1

## 2019-07-25 MED ORDER — DILTIAZEM HCL 25 MG/5ML IV SOLN
10.0000 mg | Freq: Once | INTRAVENOUS | Status: AC
  Administered 2019-07-25: 10 mg via INTRAVENOUS
  Filled 2019-07-25: qty 5

## 2019-07-25 MED ORDER — POTASSIUM CHLORIDE CRYS ER 10 MEQ PO TBCR
10.0000 meq | EXTENDED_RELEASE_TABLET | Freq: Every morning | ORAL | Status: DC
  Administered 2019-07-26 – 2019-07-27 (×2): 10 meq via ORAL
  Filled 2019-07-25 (×2): qty 1

## 2019-07-25 MED ORDER — ACETAMINOPHEN 325 MG PO TABS
650.0000 mg | ORAL_TABLET | Freq: Four times a day (QID) | ORAL | Status: DC | PRN
  Administered 2019-07-26 (×2): 650 mg via ORAL
  Filled 2019-07-25 (×2): qty 2

## 2019-07-25 MED ORDER — TRANEXAMIC ACID 1000 MG/10ML IV SOLN
500.0000 mg | Freq: Once | INTRAVENOUS | Status: AC
  Administered 2019-07-25: 500 mg via TOPICAL
  Filled 2019-07-25: qty 10

## 2019-07-25 MED ORDER — ACETAMINOPHEN 650 MG RE SUPP: 650.0000 mg | Freq: Four times a day (QID) | RECTAL | Status: DC | PRN

## 2019-07-25 MED ORDER — INSULIN ASPART 100 UNIT/ML ~~LOC~~ SOLN
0.0000 [IU] | Freq: Every day | SUBCUTANEOUS | Status: DC
  Administered 2019-07-25: 3 [IU] via SUBCUTANEOUS

## 2019-07-25 MED ORDER — CEPHALEXIN 250 MG PO CAPS
500.0000 mg | ORAL_CAPSULE | Freq: Once | ORAL | Status: AC
  Administered 2019-07-25: 500 mg via ORAL
  Filled 2019-07-25: qty 2

## 2019-07-25 MED ORDER — OXYMETAZOLINE HCL 0.05 % NA SOLN: 1.0000 | Freq: Once | NASAL | Status: DC

## 2019-07-25 MED ORDER — EZETIMIBE 10 MG PO TABS
10.0000 mg | ORAL_TABLET | Freq: Every day | ORAL | Status: DC
  Administered 2019-07-25 – 2019-07-26 (×2): 10 mg via ORAL
  Filled 2019-07-25 (×2): qty 1

## 2019-07-25 MED ORDER — SODIUM CHLORIDE 0.9% FLUSH
3.0000 mL | Freq: Two times a day (BID) | INTRAVENOUS | Status: DC
  Administered 2019-07-25 – 2019-07-27 (×4): 3 mL via INTRAVENOUS

## 2019-07-25 MED ORDER — METOPROLOL SUCCINATE ER 50 MG PO TB24
50.0000 mg | ORAL_TABLET | Freq: Every day | ORAL | Status: DC
  Administered 2019-07-26 – 2019-07-27 (×2): 50 mg via ORAL
  Filled 2019-07-25 (×2): qty 1

## 2019-07-25 MED ORDER — MELATONIN 3 MG PO TABS
3.0000 mg | ORAL_TABLET | Freq: Every day | ORAL | Status: DC
  Administered 2019-07-25 – 2019-07-26 (×2): 3 mg via ORAL
  Filled 2019-07-25 (×3): qty 1

## 2019-07-25 MED ORDER — DOFETILIDE 250 MCG PO CAPS
250.0000 ug | ORAL_CAPSULE | Freq: Two times a day (BID) | ORAL | Status: DC
  Administered 2019-07-26 – 2019-07-27 (×3): 250 ug via ORAL
  Filled 2019-07-25 (×5): qty 1

## 2019-07-25 MED ORDER — INSULIN GLARGINE 100 UNIT/ML ~~LOC~~ SOLN
35.0000 [IU] | Freq: Every day | SUBCUTANEOUS | Status: DC
  Administered 2019-07-26 (×2): 35 [IU] via SUBCUTANEOUS
  Filled 2019-07-25 (×3): qty 0.35

## 2019-07-25 MED ORDER — DILTIAZEM HCL ER COATED BEADS 300 MG PO CP24: 300.0000 mg | ORAL_CAPSULE | Freq: Every morning | ORAL | Status: DC

## 2019-07-25 MED ORDER — DILTIAZEM HCL-DEXTROSE 125-5 MG/125ML-% IV SOLN (PREMIX)
5.0000 mg/h | INTRAVENOUS | Status: DC
  Administered 2019-07-25: 5 mg/h via INTRAVENOUS
  Administered 2019-07-26: 12.5 mg/h via INTRAVENOUS
  Filled 2019-07-25 (×2): qty 125

## 2019-07-25 MED ORDER — SERTRALINE HCL 50 MG PO TABS
150.0000 mg | ORAL_TABLET | Freq: Every morning | ORAL | Status: DC
  Administered 2019-07-26 – 2019-07-27 (×2): 150 mg via ORAL
  Filled 2019-07-25 (×2): qty 1

## 2019-07-25 NOTE — H&P (Addendum)
Date: 07/25/2019               Patient Name:  Amy Wright MRN: [id number]  DOB: [birth year] Age / Sex: 78 y.o., female   PCP: Robyne Peers, MD         Medical Service: Internal Medicine Teaching Service         Attending Physician: Dr. Darl Householder Lujean Rave, MD    First Contact: Dr. Court Joy Pager: [phone]  Second Contact: Dr. Koleen Distance Pager: [phone]       After Hours (After 5p/  First Contact Pager: [phone]  weekends / holidays): Second Contact Pager: [phone]   Chief Complaint: Epistaxis  History of Present Illness: Patient is accompanied by her husband at the bedside.    Amy Wright is a 78 year old female with a past medical history significant for HTN, HLD, persistent A. Fib (on eliquis and Tikosyn) who presented to the ED with epistaxis.  The patient states that she has had 8 heavy nosebleeds over the last 3 months.  Earlier today, the patient said she started feeling "off" after she ate breakfast this morning. Around 2 PM, her nosebleeding started.  She states that the bleeding started suddenly with no identifying trigger. The blood went into her throat and she started coughing and vomiting. She also started feeling "swimmy headed". She tried using Afrin nasal spray and clamping her nose but did not see an improvement.  She states the bleeding lasted for 1 hour.  She has never seen a physician to evaluate her nosebleeds. The patient denied having any fevers headache, changes in vision, chest pain, shortness of breath, abdominal pain, changes in her bowel or bladder habits or numbness and tingling in any extremities.  She does endorse palpitations from a-fib that have been persistent for the last 5 months.  Patient stopped having regular menstrual periods at age 13.  She states that when she was having them, they were heavy for 4-5 days and lasted 6 to 7 days total.  Does not recall ever being told that she was anemic.  In the ED, a CBC, CMP, PT/INR, and troponins were  ordered. CBC was notable for leukocytosis 12.8.  Hemoglobin was low at 10.2 (BL 13.5).  PT and INR were both elevated.  All other labs were unremarkable. ENT was consulted over the phone and recommended nasal packing and starting the patient on Keflex. They will follow her up in the outpatient setting. Patient was started on a diltiazem drip, and given a dose of magnesium, potassium and 1 L bolus in the ED.  Also given a dose of Keflex.  Meds:  Current Meds  Medication Sig  . acetaminophen (TYLENOL) 500 MG tablet Take 500 mg by mouth every 6 (six) hours as needed for headache (pain).  Marland Kitchen apixaban (ELIQUIS) 5 MG TABS tablet Take 1 tablet (5 mg total) by mouth 2 (two) times daily.  . Ascorbic Acid (VITAMIN C) 1000 MG tablet Take 3,000 mg by mouth daily.   . cholecalciferol (VITAMIN D3) 25 MCG (1000 UT) tablet Take 1,000 Units by mouth daily.  . Choline Fenofibrate (TRILIPIX) 135 MG capsule Take 135 mg by mouth every morning.   . diltiazem (CARDIZEM CD) 300 MG 24 hr capsule Take 300 mg by mouth every morning.   . dofetilide (TIKOSYN) 250 MCG capsule Take 1 capsule (250 mcg total) by mouth 2 (two) times daily. Please keep upcoming appt for future refills. Thank you  . ezetimibe (ZETIA) 10 MG tablet Take 10  mg by mouth at bedtime.   . folic acid (FOLVITE) 1 MG tablet Take 1 mg by mouth every morning.   Marland Kitchen glipiZIDE (GLUCOTROL) 10 MG tablet Take 10 mg by mouth every morning.   . Insulin Glargine (LANTUS SOLOSTAR) 100 UNIT/ML Solostar Pen Inject 50 Units into the skin daily before supper.  Marland Kitchen LORazepam (ATIVAN) 0.5 MG tablet Take 0.5 mg by mouth daily as needed for anxiety (jitters).   . magnesium oxide (MAG-OX) 400 MG tablet Take 1 tablet (400 mg total) by mouth daily. (Patient taking differently: Take 400 mg by mouth every morning. )  . Melatonin 3 MG TABS Take 3 mg by mouth at bedtime.   . metFORMIN (GLUCOPHAGE) 1000 MG tablet Take 1,000 mg by mouth 2 (two) times daily with a meal.  . metoprolol  succinate (TOPROL-XL) 50 MG 24 hr tablet Take 50 mg by mouth daily.   . potassium chloride (KLOR-CON) 10 MEQ tablet Take 1 tablet (10 mEq total) by mouth daily. Please make overdue appt with Dr. Johney Frame before anymore refills. 2nd attempt (Patient taking differently: Take 10 mEq by mouth every morning. Please make overdue appt with Dr. Johney Frame before anymore refills. 2nd attempt)  . sertraline (ZOLOFT) 100 MG tablet Take 150 mg by mouth every morning.   . sodium chloride (AYR) 0.65 % nasal spray Place 1 spray into the nose as needed for congestion.  . vitamin B-12 (CYANOCOBALAMIN) 100 MCG tablet Take 100 mcg by mouth every morning.   Marland Kitchen VITAMIN E PO Take 1 capsule by mouth once a week.  . zolpidem (AMBIEN CR) 6.25 MG CR tablet Take 6.25 mg by mouth at bedtime.    Allergies: Allergies as of 07/25/2019 - Review Complete 07/25/2019  Allergen Reaction Noted  . Rosuvastatin calcium Other (See Comments) 03/29/2013  . Statins Other (See Comments) 03/29/2013  . Amoxicillin Rash 10/29/2017  . Atorvastatin Other (See Comments) 08/25/2015  . Ezetimibe-simvastatin Other (See Comments) 08/25/2015  . Penicillins Itching, Swelling, and Rash 05/04/2018   Past Medical History:  Diagnosis Date  . Anxiety   . Chronic insomnia   . Hyperlipidemia   . Hypertension   . Hypertriglyceridemia   . Obesity   . OSA on CPAP   . Persistent atrial fibrillation (HCC) 11/07/14   Chads2vasc score of at least 4  . Type II diabetes mellitus (HCC)   . 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 Central jet. Aortic Valve appears to be mildly sclerotic. Trace aortic regurgitation.EF >55%   Past Surgical History:  Procedure Laterality Date  . APPENDECTOMY  1971  . CARDIOVERSION N/A 06/23/2013   Procedure: CARDIOVERSION;  Surgeon: Lennette Bihari, MD;  Location[facility] END[facility]COPY;  Service: Cardiovascular;  Laterality: N/A;  . CATARACT EXTRACTION W/ INTRAOCULAR LENS IMPLANT Bilateral    . CESAREAN SECTION  1971  . PLACEMENT OF BREAST IMPLANTS Bilateral 1985   Family History:  Family History  Problem Relation Age of Onset  . Heart attack Mother   . Cardiomyopathy Mother   . Cancer - Lung Father   . Cancer - Other Maternal Grandmother   . Stroke Paternal Grandfather   . Diabetes Maternal Grandfather    - Patient states her sister told her that there mother had frequent nosebleeds at her age, similar to what she is experiencing now.  - Denies family history of bleeding disorders  Social History:  - Lives with husband at home - Denies any tobacco or alcohol use   Review of  Systems: All systems were reviewed and are otherwise negative unless mentioned in the HPI.    Physical Exam: Blood pressure (!) 135/97, pulse (!) 115, resp. rate (!) 23, height 5\' 9"  (1.753 m), weight 113.4 kg, SpO2 93 %.  Physical Exam Vitals signs reviewed.  Constitutional:      General: She is not in acute distress.    Appearance: She is obese. She is not ill-appearing, toxic-appearing or diaphoretic.  HENT:     Head: Normocephalic and atraumatic.     Nose:     Comments: Dried blood and packing in bilateral nares Eyes:     General: No scleral icterus.       Right eye: No discharge.        Left eye: No discharge.     Conjunctiva/sclera: Conjunctivae normal.  Cardiovascular:     Rate and Rhythm: Tachycardia present. Rhythm irregular.     Pulses: Normal pulses.     Heart sounds: Normal heart sounds. No murmur. No friction rub. No gallop.   Pulmonary:     Effort: Pulmonary effort is normal. No respiratory distress.     Breath sounds: Normal breath sounds. No wheezing or rales.  Abdominal:     General: Bowel sounds are normal. There is no distension.     Palpations: Abdomen is soft.     Tenderness: There is no abdominal tenderness. There is no guarding.  Musculoskeletal:     Right lower leg: Edema (1+) present.     Left lower leg: Edema (2+) present.  Skin:    General: Skin is  warm.  Neurological:     General: No focal deficit present.     Mental Status: She is alert and oriented to person, place, and time.  Psychiatric:        Mood and Affect: Mood normal.    Assessment & Plan by Problem: Active Problems:   Atrial fibrillation with RVR (HCC)  In summary, Amy Wright is a 78 year old female with past medical history significant for HTN, HLD, persistent A. Fib (on eliquis and Tikosyn) who presented to the ED with epistaxis in addition to A. fib with RVR.  It is unclear what the cause of her epistaxis is at this time.  Of note, the patient was not Covid tested when she first came into the ED, and is refusing it at this time.   #Episataxis: Patient reported having 8 heavy nosebleeds within the last 3 months. Was switched from Coumadin to Eliquis 1 month ago.  It is unclear what is causing her epistaxis at this time.  There does not seem to be any convincing family history or a strong history of frequent bleeding that would suggest a genetic component. She did have her Coumadin switched to Eliquis 1 month ago, however her bleeding preceded this medication change. ENT was consulted via telephone, will appreciate their recommendations. Patient is not currently bleeding. - Continue nasal packing for 5 days  #A-fib w/ RVR: Patient presented with palpitations and irregular rhythm, and tachycardia to 154. She states this has been the case for the last 5 months. Patient takes metoprolol 50 mg daily, diltiazem 300 mg daily Tikosyn 250 mcg daily, and Eliquis 5 mg twice daily at home.  Patient was started on diltiazem drip, and received a dose of magnesium and potassium in the ED.   - Continue diltiazem drip - Tikosyn 250 mcg daily - Metoprolol 50 mg daily  #T2DM: Patient takes metformin 1000 mg daily,  Glipizide 10 mg daily, and  Lantus 50 U nightly - SSI - Lantus 35 U nightly  #HLD - Ezetimibe 10 mg daily   #Anxiety: (A1c 8.5 07/2019) Patient takes Zoloft 150 mg daily,  Ativan 0.5 mg as needed, and Ambien 5 mg daily at bedtime at home. - Continue home Zoloft, Ativan, and Ambien  #FEN/GI - Diet: Carb modified - Fluids: None   #DVT prophylaxis -On Eliquis, had already received dose today.  #Code status: Full  #Dispo: Admit patient to Inpatient with expected length of stay greater than 2 midnights.  Signed: Kirt BoysAndrew Milbern Doescher, MD Internal Medicine, PGY1 Pager: [phone]  07/25/2019,12:12 AM

## 2019-07-25 NOTE — ED Provider Notes (Signed)
[facility] EMERGENCY DEPARTMENT Provider Note   CSN: [id number] Arrival date & time: 07/25/19  1733     History   Chief Complaint Chief Complaint  Patient presents with  . Epistaxis    HPI Amy Wright is a 78 y.o. female.     HPI  Amy Wright is 78 year old female with PMH of HTN, HLD, persistent afib (on eliquis and Tikosyn) who presents to the ED with concern for epistaxis. Pt reports she has had recurrent nose bleeds in the past. Endorses bleeding from Left nare on and off since 2P today. She has tried afrin and pressure without much sustained relief. Has required packing in the past. No history of cauterization. Endorses she was switched from warfarin to eliquis within the last month. No recent illness. Endorses constant palpitations from being in afib persistently for 5 months. No chest pain. No vomiting. No nose trauma.  Endorses feeling lightheaded. No missed medications.   Past Medical History:  Diagnosis Date  . Anxiety   . Chronic insomnia   . Hyperlipidemia   . Hypertension   . Hypertriglyceridemia   . Obesity   . OSA on CPAP   . Persistent atrial fibrillation (HCC) 11/07/14   Chads2vasc score of at least 4  . Type II diabetes mellitus (HCC)   . 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 Central jet. Aortic Valve appears to be mildly sclerotic. Trace aortic regurgitation.EF >55%    Patient Active Problem List   Diagnosis Date Noted  . Left-sided epistaxis   . Long term current use of anticoagulant therapy   . Atrial fibrillation with RVR (HCC) 07/25/2019  . Breast pain, left 12/03/2016  . Hx of long term use of blood thinners 04/01/2016  . Uncontrolled type 2 diabetes mellitus with hyperglycemia (HCC) 11/26/2015  . Microscopic hematuria 09/19/2015  . Left foot pain 08/22/2015  . Obesity 02/15/2015  . Frequency of urination 06/13/2014  . 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  . Depression 03/22/2014   . Insomnia 03/22/2014  . Vaginitis 03/22/2014  . Hyperlipemia 03/22/2014  . Hypertriglyceridemia 03/22/2014  . Atrial fibrillation (HCC) 08/09/2013  . Obstructive sleep apnea syndrome 08/07/2013  . Persistent atrial fibrillation (HCC) 03/29/2013  . Anticoagulated on Coumadin 03/29/2013  . DM2 (diabetes mellitus, type 2) (HCC) 03/29/2013  . Mixed hyperlipidemia 03/29/2013  . Essential hypertension 03/29/2013    Past Surgical History:  Procedure Laterality Date  . APPENDECTOMY  1971  . CARDIOVERSION N/A 06/23/2013   Procedure: CARDIOVERSION;  Surgeon: Lennette Bihari, MD;  Location: [facility] ENDOSCOPY;  Service: Cardiovascular;  Laterality: N/A;  . CATARACT EXTRACTION W/ INTRAOCULAR LENS IMPLANT Bilateral   . CESAREAN SECTION  1971  . PLACEMENT OF BREAST IMPLANTS Bilateral 1985     OB History   No obstetric history on file.      Home Medications    Prior to Admission medications   Medication Sig Start Date End Date Taking? Authorizing Provider  acetaminophen (TYLENOL) 500 MG tablet Take 500 mg by mouth every 6 (six) hours as needed for headache (pain).   Yes [provider]  apixaban (ELIQUIS) 5 MG TABS tablet Take 1 tablet (5 mg total) by mouth 2 (two) times daily. 07/05/19  Yes Allred, Fayrene Fearing, MD  Ascorbic Acid (VITAMIN C) 1000 MG tablet Take 3,000 mg by mouth daily.    Yes [provider]  cholecalciferol (VITAMIN D3) 25 MCG (1000 UT) tablet Take 1,000 Units by mouth daily.   Yes  [provider]  Choline Fenofibrate (TRILIPIX) 135 MG capsule Take 135 mg by mouth every morning.    Yes [provider]  diltiazem (CARDIZEM CD) 300 MG 24 hr capsule Take 300 mg by mouth every morning.  04/28/17  Yes [provider]  dofetilide (TIKOSYN) 250 MCG capsule Take 1 capsule (250 mcg total) by mouth 2 (two) times daily. Please keep upcoming appt for future refills. Thank you 05/16/19  Yes Kroeger, Dot Lanes M., PA-C  ezetimibe (ZETIA) 10 MG tablet Take 10  mg by mouth at bedtime.    Yes [provider]  folic acid (FOLVITE) 1 MG tablet Take 1 mg by mouth every morning.    Yes [provider]  glipiZIDE (GLUCOTROL) 10 MG tablet Take 10 mg by mouth every morning.  03/24/17  Yes [provider]  Insulin Glargine (LANTUS SOLOSTAR) 100 UNIT/ML Solostar Pen Inject 50 Units into the skin daily before supper.   Yes [provider]  LORazepam (ATIVAN) 0.5 MG tablet Take 0.5 mg by mouth daily as needed for anxiety (jitters).    Yes [provider]  magnesium oxide (MAG-OX) 400 MG tablet Take 1 tablet (400 mg total) by mouth daily. Patient taking differently: Take 400 mg by mouth every morning.  02/14/16  Yes Seiler, Amber K, NP  Melatonin 3 MG TABS Take 3 mg by mouth at bedtime.    Yes [provider]  metFORMIN (GLUCOPHAGE) 1000 MG tablet Take 1,000 mg by mouth 2 (two) times daily with a meal.   Yes [provider]  metoprolol succinate (TOPROL-XL) 50 MG 24 hr tablet Take 50 mg by mouth daily.  03/10/17  Yes [provider]  potassium chloride (KLOR-CON) 10 MEQ tablet Take 1 tablet (10 mEq total) by mouth daily. Please make overdue appt with Dr. Johney Frame before anymore refills. 2nd attempt Patient taking differently: Take 10 mEq by mouth every morning. Please make overdue appt with Dr. Johney Frame before anymore refills. 2nd attempt 07/21/19  Yes Allred, Fayrene Fearing, MD  sertraline (ZOLOFT) 100 MG tablet Take 150 mg by mouth every morning.  02/04/17  Yes [provider]  sodium chloride (AYR) 0.65 % nasal spray Place 1 spray into the nose as needed for congestion.   Yes [provider]  vitamin B-12 (CYANOCOBALAMIN) 100 MCG tablet Take 100 mcg by mouth every morning.    Yes [provider]  VITAMIN E PO Take 1 capsule by mouth once a week.   Yes [provider]  zolpidem (AMBIEN CR) 6.25 MG CR tablet Take 6.25 mg by mouth at bedtime.  09/26/14  Yes [provider]   ACCU-CHEK COMPACT PLUS test strip CHECK BLOOD SUGAR TWICE DAILY OR AS DIRECTED 02/27/16   [provider]    Family History Family History  Problem Relation Age of Onset  . Heart attack Mother   . Cardiomyopathy Mother   . Cancer - Lung Father   . Cancer - Other Maternal Grandmother   . Stroke Paternal Grandfather   . Diabetes Maternal Grandfather     Social History Social History   Tobacco Use  . Smoking status: Passive Smoke Exposure - Never Smoker  . Smokeless tobacco: Never Used  Substance Use Topics  . Alcohol use: Yes    Comment: 08/09/2013 "nothing to drink in >4 yrs; never had a problem w/it"  . Drug use: No     Allergies   Rosuvastatin calcium, Statins, Amoxicillin, Atorvastatin, Ezetimibe-simvastatin, and Penicillins   Review of Systems  Review of Systems  Constitutional: Negative for chills and fever.  HENT: Positive for nosebleeds. Negative for ear pain and sore throat.   Eyes: Negative for pain and visual disturbance.  Respiratory: Negative for cough and shortness of breath.   Cardiovascular: Positive for palpitations. Negative for chest pain.  Gastrointestinal: Positive for nausea. Negative for abdominal pain and vomiting.  Genitourinary: Negative for dysuria and hematuria.  Musculoskeletal: Negative for arthralgias and back pain.  Skin: Negative for color change and rash.  Neurological: Positive for light-headedness. Negative for seizures and syncope.  All other systems reviewed and are negative.    Physical Exam Updated Vital Signs BP 112/77   Pulse (!) 115   Temp 98.4 F (36.9 C) (Oral)   Resp 20   Ht  (1.753 m)   Wt 118.6 kg   SpO2 100%   BMI 38.61 kg/m   Physical Exam Vitals signs and nursing note reviewed.  Constitutional:      General: She is in acute distress.     Appearance: She is well-developed.  HENT:     Head: Normocephalic and atraumatic.     Nose:     Comments: Epistaxis from L nare +Blood in oropharynx   Eyes:     Conjunctiva/sclera: Conjunctivae normal.  Neck:     Musculoskeletal: Neck supple.  Cardiovascular:     Rate and Rhythm: Tachycardia present. Rhythm irregular.     Heart sounds: No murmur.  Pulmonary:     Effort: Pulmonary effort is normal. No respiratory distress.     Breath sounds: Normal breath sounds.  Abdominal:     Palpations: Abdomen is soft.     Tenderness: There is no abdominal tenderness.  Musculoskeletal:        General: No swelling or tenderness.  Skin:    General: Skin is warm and dry.  Neurological:     General: No focal deficit present.     Mental Status: She is alert and oriented to person, place, and time.      ED Treatments / Results  Labs (all labs ordered are listed, but only abnormal results are displayed) Labs Reviewed  CBC WITH DIFFERENTIAL/PLATELET - Abnormal; Notable for the following components:      Result Value   WBC 12.8 (*)    Hemoglobin 10.2 (*)    HCT 33.8 (*)    MCH 25.1 (*)    RDW 16.1 (*)    Platelets 515 (*)    Neutro Abs 9.8 (*)    Abs Immature Granulocytes 0.08 (*)    All other components within normal limits  COMPREHENSIVE METABOLIC PANEL - Abnormal; Notable for the following components:   CO2 20 (*)    Glucose, Bld 315 (*)    GFR calc non Af Amer 55 (*)    Anion gap 16 (*)    All other components within normal limits  PROTIME-INR - Abnormal; Notable for the following components:   Prothrombin Time 15.9 (*)    INR 1.3 (*)    All other components within normal limits  PROTIME-INR - Abnormal; Notable for the following components:   Prothrombin Time 16.0 (*)    INR 1.3 (*)    All other components within normal limits  BASIC METABOLIC PANEL - Abnormal; Notable for the following components:   Glucose, Bld 257 (*)    All other components within normal limits  HEMOGLOBIN A1C - Abnormal; Notable for the following components:   Hgb A1c MFr Bld 8.5 (*)  All other components within normal limits  CBC - Abnormal;  Notable for the following components:   RBC 3.72 (*)    Hemoglobin 9.2 (*)    HCT 29.5 (*)    MCV 79.3 (*)    MCH 24.7 (*)    RDW 15.9 (*)    All other components within normal limits  GLUCOSE, CAPILLARY - Abnormal; Notable for the following components:   Glucose-Capillary 229 (*)    All other components within normal limits  GLUCOSE, CAPILLARY - Abnormal; Notable for the following components:   Glucose-Capillary 224 (*)    All other components within normal limits  CBG MONITORING, ED - Abnormal; Notable for the following components:   Glucose-Capillary 275 (*)    All other components within normal limits  SARS CORONAVIRUS 2 (TAT 6-24 HRS)  TYPE AND SCREEN  ABO/RH  TROPONIN I (HIGH SENSITIVITY)  TROPONIN I (HIGH SENSITIVITY)    EKG EKG Interpretation  Date/Time:  Monday July 25 2019 19:42:14 EST Ventricular Rate:  139 PR Interval:    QRS Duration: 71 QT Interval:  367 QTC Calculation: 553 R Axis:   35 Text Interpretation: Atrial fibrillation with rapid ventricular response Ventricular premature complex Low voltage, precordial leads Borderline abnrm T, anterolateral leads Prolonged QT interval Baseline wander in lead(s) V3 V5 When compared with ECG of 07/16/2019, QT has lengthened HEART RATE has increased Confirmed by Delora Fuel ([id number]) on 07/26/2019 1:44:12 AM   Radiology No results found.  Procedures .Epistaxis Management  Date/Time: 07/26/2019 2:30 PM Performed by: Burns Spain, MD Authorized by: Drenda Freeze, MD   Consent:    Consent obtained:  Verbal   Consent given by:  Patient   Risks discussed:  Nasal injury, pain, infection and bleeding   Alternatives discussed:  Alternative treatment Procedure details:    Treatment site:  L anterior   Treatment method:  Anterior pack   Treatment episode: recurring   Post-procedure details:    Assessment:  Bleeding stopped   Patient tolerance of procedure:  Tolerated well, no immediate complications    (including critical care time)  Medications Ordered in ED    Initial Impression / Assessment and Plan / ED Course  I have reviewed the triage vital signs and the nursing notes.  Pertinent labs & imaging results that were available during my care of the patient were reviewed by me and considered in my medical decision making (see chart for details).  On arrival, Pt is awake and alert but in acute distress. HR ranging from 140s-180s  EKG: Afib with RVR (rate 139) IV access obtained and patient started on IVF  Anterior L nasal packing performed with Rhinorocket. Topical TXA administered on Rhinorocket. Upon multiple reassessments, pt has sustained hemostasis of epistaxis. Likely anterior source of bleeding.   However, HR continues to be elevated, concerning for afib w/ RVR. Given diltiazem bolus x2 and initiated on gtt. Reports she is feeling better upon subsequent reassessment. HR improving.   Patient given Keflex for packing ppx. Discussed with on-call ENT who recommends leaving packing in place for 5 days. Pt will likely benefit from outpatient ENT referral for recurrent epistaxis.   Admitted to medicine for Afib w RVR requiring diltiazem gtt. No further acute events.   Final Clinical Impressions(s) / ED Diagnoses   Final diagnoses:  None    ED Discharge Orders         Ordered    metoprolol succinate (TOPROL-XL) 50 MG 24 hr tablet  Daily  Pending    cephALEXin (KEFLEX) 500 MG capsule  Every 12 hours     Pending           Norton PastelKerby, Jenna, MD 07/26/19 1438    Charlynne PanderYao, David Hsienta, MD 07/26/19 1459

## 2019-07-25 NOTE — ED Triage Notes (Signed)
Per [facility] pt from home, presents w/a nose bleed x2-3 hours ago, spitting up large blood clots. On blood thinners, started Elquis 2 weeks ago for A-fib. Hx of the same usually get s cauterized.  Pt refused EKG, IV and afrin with EMS.   144/88 113 CBG 300 97.7 97% RA

## 2019-07-25 NOTE — ED Notes (Signed)
Informed Resident MD that pt's nose packing was leaking (small amount).

## 2019-07-26 ENCOUNTER — Telehealth: Payer: Self-pay | Admitting: Internal Medicine

## 2019-07-26 DIAGNOSIS — E782 Mixed hyperlipidemia: Secondary | ICD-10-CM | POA: Diagnosis present

## 2019-07-26 DIAGNOSIS — Z833 Family history of diabetes mellitus: Secondary | ICD-10-CM | POA: Diagnosis not present

## 2019-07-26 DIAGNOSIS — Z79890 Hormone replacement therapy (postmenopausal): Secondary | ICD-10-CM | POA: Diagnosis not present

## 2019-07-26 DIAGNOSIS — F419 Anxiety disorder, unspecified: Secondary | ICD-10-CM | POA: Diagnosis present

## 2019-07-26 DIAGNOSIS — I10 Essential (primary) hypertension: Secondary | ICD-10-CM | POA: Diagnosis present

## 2019-07-26 DIAGNOSIS — Z7901 Long term (current) use of anticoagulants: Secondary | ICD-10-CM

## 2019-07-26 DIAGNOSIS — Z79899 Other long term (current) drug therapy: Secondary | ICD-10-CM | POA: Diagnosis not present

## 2019-07-26 DIAGNOSIS — M25572 Pain in left ankle and joints of left foot: Secondary | ICD-10-CM | POA: Diagnosis present

## 2019-07-26 DIAGNOSIS — R040 Epistaxis: Secondary | ICD-10-CM

## 2019-07-26 DIAGNOSIS — Z8249 Family history of ischemic heart disease and other diseases of the circulatory system: Secondary | ICD-10-CM | POA: Diagnosis not present

## 2019-07-26 DIAGNOSIS — Z794 Long term (current) use of insulin: Secondary | ICD-10-CM | POA: Diagnosis not present

## 2019-07-26 DIAGNOSIS — Z20828 Contact with and (suspected) exposure to other viral communicable diseases: Secondary | ICD-10-CM | POA: Diagnosis present

## 2019-07-26 DIAGNOSIS — E119 Type 2 diabetes mellitus without complications: Secondary | ICD-10-CM | POA: Diagnosis present

## 2019-07-26 DIAGNOSIS — Z880 Allergy status to penicillin: Secondary | ICD-10-CM | POA: Diagnosis not present

## 2019-07-26 DIAGNOSIS — E669 Obesity, unspecified: Secondary | ICD-10-CM | POA: Diagnosis present

## 2019-07-26 DIAGNOSIS — I4891 Unspecified atrial fibrillation: Secondary | ICD-10-CM | POA: Diagnosis present

## 2019-07-26 DIAGNOSIS — Z888 Allergy status to other drugs, medicaments and biological substances status: Secondary | ICD-10-CM | POA: Diagnosis not present

## 2019-07-26 DIAGNOSIS — E781 Pure hyperglyceridemia: Secondary | ICD-10-CM | POA: Diagnosis present

## 2019-07-26 DIAGNOSIS — M542 Cervicalgia: Secondary | ICD-10-CM | POA: Diagnosis present

## 2019-07-26 DIAGNOSIS — I4819 Other persistent atrial fibrillation: Secondary | ICD-10-CM | POA: Diagnosis present

## 2019-07-26 DIAGNOSIS — F5104 Psychophysiologic insomnia: Secondary | ICD-10-CM | POA: Diagnosis present

## 2019-07-26 DIAGNOSIS — Z6838 Body mass index (BMI) 38.0-38.9, adult: Secondary | ICD-10-CM | POA: Diagnosis not present

## 2019-07-26 DIAGNOSIS — G4733 Obstructive sleep apnea (adult) (pediatric): Secondary | ICD-10-CM | POA: Diagnosis present

## 2019-07-26 LAB — BASIC METABOLIC PANEL
Anion gap: 10 (ref 5–15)
BUN: 23 mg/dL (ref 8–23)
CO2: 22 mmol/L (ref 22–32)
Calcium: 9.4 mg/dL (ref 8.9–10.3)
Chloride: 107 mmol/L (ref 98–111)
Creatinine, Ser: 0.88 mg/dL (ref 0.44–1.00)
GFR calc Af Amer: >60 mL/min (ref >60)
GFR calc non Af Amer: >60 mL/min (ref >60)
Glucose, Bld: 257 mg/dL — ABNORMAL HIGH (ref 70–99)
Potassium: 4.4 mmol/L (ref 3.5–5.1)
Sodium: 139 mmol/L (ref 135–145)

## 2019-07-26 LAB — CBC
HCT: 29.5 % — ABNORMAL LOW (ref 36.0–46.0)
Hemoglobin: 9.2 g/dL — ABNORMAL LOW (ref 12.0–15.0)
MCH: 24.7 pg — ABNORMAL LOW (ref 26.0–34.0)
MCHC: 31.2 g/dL (ref 30.0–36.0)
MCV: 79.3 fL — ABNORMAL LOW (ref 80.0–100.0)
Platelets: 376 10*3/uL (ref 150–400)
RBC: 3.72 MIL/uL — ABNORMAL LOW (ref 3.87–5.11)
RDW: 15.9 % — ABNORMAL HIGH (ref 11.5–15.5)
WBC: 10.2 10*3/uL (ref 4.0–10.5)
nRBC: 0.2 % (ref 0.0–0.2)

## 2019-07-26 LAB — GLUCOSE, CAPILLARY
Glucose-Capillary: 229 mg/dL — ABNORMAL HIGH (ref 70–99)
Glucose-Capillary: 224 mg/dL — ABNORMAL HIGH (ref 70–99)
Glucose-Capillary: 156 mg/dL — ABNORMAL HIGH (ref 70–99)
Glucose-Capillary: 171 mg/dL — ABNORMAL HIGH (ref 70–99)

## 2019-07-26 LAB — PROTIME-INR
INR: 1.3 — ABNORMAL HIGH (ref 0.8–1.2)
Prothrombin Time: 16 seconds — ABNORMAL HIGH (ref 11.4–15.2)

## 2019-07-26 LAB — SARS CORONAVIRUS 2 (TAT 6-24 HRS): SARS Coronavirus 2: NEGATIVE

## 2019-07-26 MED ORDER — CEPHALEXIN 500 MG PO CAPS
500.0000 mg | ORAL_CAPSULE | Freq: Two times a day (BID) | ORAL | Status: DC
  Administered 2019-07-26 – 2019-07-27 (×3): 500 mg via ORAL
  Filled 2019-07-26 (×3): qty 1

## 2019-07-26 MED ORDER — CEPHALEXIN 250 MG PO CAPS: 500.0000 mg | ORAL_CAPSULE | Freq: Two times a day (BID) | ORAL | Status: DC

## 2019-07-26 MED ORDER — DILTIAZEM HCL ER COATED BEADS 360 MG PO CP24: 360.0000 mg | ORAL_CAPSULE | Freq: Every morning | ORAL | 0 refills | Status: DC

## 2019-07-26 MED ORDER — DILTIAZEM HCL 60 MG PO TABS
90.0000 mg | ORAL_TABLET | Freq: Four times a day (QID) | ORAL | Status: DC
  Administered 2019-07-26 – 2019-07-27 (×4): 90 mg via ORAL
  Filled 2019-07-26 (×4): qty 2

## 2019-07-26 MED ORDER — CEPHALEXIN 500 MG PO CAPS: 500.0000 mg | ORAL_CAPSULE | Freq: Two times a day (BID) | ORAL | 0 refills | Status: AC

## 2019-07-26 NOTE — Telephone Encounter (Signed)
Returned call to husband d/t daughter is not listed on DPR.  Pt will be kept at hospital overnight tonight per husband because he states EMT's dropped Pt at [facility] and now she has some bruising and pain.  Keeping her overnight to assess.  Pt needs referral for ENT.  Per husband Pt wants Dr. Rayann Heman to refer her.  Pt had ENT appt on 07/19/2019 but Pt cancelled because she looked up reviews on ENT she was scheduled with and didn't want to see him.  Husband says she would see whoever was referred by any cardiologist in our practice.    Pt needs follow up after hospitalization with afib with RVR.  Advised husband Pt should be seen by afib clinic later this week after discharge.  Husband in agreement.  Will enter ENT referral.  Will send to afib clinic for close follow up after discharge.  Husband thanked nurse for call back.

## 2019-07-26 NOTE — Progress Notes (Signed)
Pt daughter called advising that her mother "fell" while with EMS in ED. EMT named Lovena Le was involved. Pt daughter states that mother is stating her Left shoulder and neck were hurting as a result. Pt has not mentioned this to charge rn prior to now. ED charge RN notified to investigate. Jerald Kief, RN

## 2019-07-26 NOTE — Progress Notes (Signed)
Inpatient Diabetes Program Recommendations  AACE/ADA: New Consensus Statement on Inpatient Glycemic Control (2015)  Target Ranges:  Prepandial:   less than 140 mg/dL      Peak postprandial:   less than 180 mg/dL (1-2 hours)      Critically ill patients:  140 - 180 mg/dL   Lab Results  Component Value Date   GLUCAP 224 (H) 07/26/2019   HGBA1C 8.5 (H) 07/25/2019    Review of Glycemic Control Results for SHECCID, LAHMANN (MRN [id number]) as of 07/26/2019 12:27  Ref. Range 07/25/2019 23:54 07/26/2019 06:36 07/26/2019 11:18  Glucose-Capillary Latest Ref Range: 70 - 99 mg/dL 275 (H)  Novolog 3 units  Lantus 35 units 229 (H)  Novolog 7 units 224 (H)   Diabetes history: DM 2 Outpatient Diabetes medications: Lantus 50 units qhs, Metformin 1000 mg bid, Glipizide 10 mg Daily Current orders for Inpatient glycemic control:  Lantus 35 units Novolog 0-20 units tid + hs  A1c 8.5% on 11/16  Inpatient Diabetes Program Recommendations:    Glucose 200's. May consider increasing Lantus closer to home dose.  Thanks,  Tama Headings RN, MSN, BC-ADM Inpatient Diabetes Coordinator Team Pager [phone] (8a-5p)

## 2019-07-26 NOTE — Telephone Encounter (Signed)
appt made to be printed on AVS at discharge for Friday, November 20th.

## 2019-07-26 NOTE — Progress Notes (Addendum)
   Subjective: Patient resting in bed and reports she is tired.  Nose bleed currently being controlled with packing and rhino rocket.  Agreeable to having Covid testing during interview, but later refuses testing by nursing staff.    Objective:  Vital signs in last 24 hours: Vitals:   07/26/19 0900 07/26/19 1023 07/26/19 1056 07/26/19 1119  BP: (!) 151/101 (!) 158/75 (!) 180/82   Pulse:  (!) 115    Resp: (!) 21   (!) 21  Temp:    98.4 F (36.9 C)  TempSrc:    Oral  SpO2: 94%   95%  Weight:      Height:       Gen: Obese, NAD HEENT: Rhino rocket and packing in left nare . [place]/AT , tender to palpation of neck muscles.  Cardiovascular: Tachy on exam 110-120, irregular rhythm, no murmurs Pulmonary: CTAB, no wheezes or rhonchi Abdominal: normal bowel sounds, non tender  Assessment/Plan:  Active Problems:   Atrial fibrillation with RVR (HCC)   Left-sided epistaxis   Long term current use of anticoagulant therapy  Amy Wright is a 78 year old female with past medical history significant for HTN, HLD, persistent A. fib (on Eliquis and Tikosyn) who presented to the ED with epistasis found to be in A. fib with RVR.    #Epistaxis 40-month history of left nostril epistaxis.  Previous ED visit patient given Afrin with nasal packing.  Patient has not seen ENT for this problem.  Likely an anterior nose bleed and will stop with packing. Will need follow up with ENT.  - hold Eliquis, follow up with ENT - Continue Keflex per ENT recommendations  #A-fib w/RVR Patient on diltiazem drip at the rate of 12.5 mix per hour and switch to 90 mg of oral diltiazem every 6 hours.  In addition patient is on metoprolol succinate 50 mg and Tikosyn 226mcg. Rate has been controlled this afternoon, 90 -100 bpm.  Holding Eliquis in setting of acute bleed. Plan to monitor overnight. - Diltiazem 90 mg q6hrs - Metoprolol succinate 50 mg - Tikosyn 250 mcg - Telemetry   #T2DM Patient takes Metformin 1000 mg  daily, glipizide 10 mg daily and Lantus 50 units nightly -SSI -Lantus 35  #HLD -Ezetimibe 10 mg daily  #Anxiety - Continue home Zoloft, ativan, and ambien   Dispo: Anticipated discharge in approximately 0-1 days.  Tamsen Snider, MD PGY1  [phone]

## 2019-07-26 NOTE — Telephone Encounter (Signed)
Patient calling because her mother has went to the ER again last night due to a nose bleed. States she was in the ER until 3AM then moved to another room. EMT's dropped her so they are now having a patient advocate come and talk to her shortly. Her head and shoulder are hurting due to the fall. She has been held because of HR and BP. They are checking her HR again after 2:00pm to see if she can be released. She is refusing a covid test because of her nose bleeds. Patient's daughter wanted to make Dr. Rayann Heman aware and wants to speak with Dr. Rayann Heman or a nurse in regards to this situation.

## 2019-07-26 NOTE — Progress Notes (Addendum)
Admitted from [place] and oriented accompanied by RN,transfer to bed hooked to cardiac monitoring,CCMD notified,V/S checked and oriented to room.Covid 19 test was not done in ED and patient refused nasal swab,crying afraid of profused bleeding will happen if it will be done, the other nostril is packed to control bleeding.The patient is admitted initially because of uncontrolled  nose bleeding.Dr Sheppard Coil made aware and let the patient talked to him.The team of Dr.Alexander will follow up her  in AM.Patient put in droplet precaution,Will continue to monitor.

## 2019-07-27 ENCOUNTER — Inpatient Hospital Stay (HOSPITAL_COMMUNITY): Payer: Medicare Other

## 2019-07-27 DIAGNOSIS — D62 Acute posthemorrhagic anemia: Secondary | ICD-10-CM | POA: Diagnosis present

## 2019-07-27 LAB — CBC
HCT: 29.8 % — ABNORMAL LOW (ref 36.0–46.0)
Hemoglobin: 9.3 g/dL — ABNORMAL LOW (ref 12.0–15.0)
MCH: 25 pg — ABNORMAL LOW (ref 26.0–34.0)
MCHC: 31.2 g/dL (ref 30.0–36.0)
MCV: 80.1 fL (ref 80.0–100.0)
Platelets: 435 10*3/uL — ABNORMAL HIGH (ref 150–400)
RBC: 3.72 MIL/uL — ABNORMAL LOW (ref 3.87–5.11)
RDW: 15.9 % — ABNORMAL HIGH (ref 11.5–15.5)
WBC: 12.3 10*3/uL — ABNORMAL HIGH (ref 4.0–10.5)
nRBC: 0 % (ref 0.0–0.2)

## 2019-07-27 LAB — MAGNESIUM: Magnesium: 1.9 mg/dL (ref 1.7–2.4)

## 2019-07-27 LAB — GLUCOSE, CAPILLARY
Glucose-Capillary: 205 mg/dL — ABNORMAL HIGH (ref 70–99)
Glucose-Capillary: 274 mg/dL — ABNORMAL HIGH (ref 70–99)

## 2019-07-27 MED ORDER — DILTIAZEM HCL ER COATED BEADS 180 MG PO CP24
360.0000 mg | ORAL_CAPSULE | Freq: Every day | ORAL | Status: DC
  Administered 2019-07-27: 360 mg via ORAL
  Filled 2019-07-27: qty 2

## 2019-07-27 MED ORDER — DILTIAZEM HCL ER COATED BEADS 360 MG PO CP24: 360.0000 mg | ORAL_CAPSULE | Freq: Every day | ORAL | 0 refills | Status: DC

## 2019-07-27 NOTE — Progress Notes (Signed)
Cpap was ordered. RT was concerned because of nose packing. MD stated to hold off on Cpap, packing needs to stay in place.   Tawanna Sat, RN 07/27/2019 1:14 AM

## 2019-07-27 NOTE — Evaluation (Signed)
pOccupational Therapy Evaluation Patient Details Name: Amy Wright MRN: [id number] DOB: 04/22/1941 Today's Date: 07/27/2019    History of Present Illness Dollye Glasser is a 78 year old female with past medical history significant for HTN, HLD, persistent A. fib (on Eliquis and Tikosyn) who presented to the ED with epistaxis found to be in A. fib with RVR.   Clinical Impression   PTA patient reports independent with ADls, limited IADLs, mobility.  Admitted for above and limited by problem list below, including impaired cognition, decreased activity tolerance and generalized weakness.  Patient currently requires supervision for ADLs and modified independent for basic transfers.  Limited eval due to patient participation, eager for dc home. Reports having 24/7 support from spouse and family.  Attempted cognitive eval, but patient declined completion and noted hallucinations (see below) but RN report baseline.  Planned dc home today, therefore will have HHOT follow up after dc.      Follow Up Recommendations  Home health OT;Supervision/Assistance - 24 hour    Equipment Recommendations  None recommended by OT    Recommendations for Other Services       Precautions / Restrictions Precautions Precautions: Fall Precaution Comments: L nare with packing Restrictions Weight Bearing Restrictions: No      Mobility Bed Mobility               General bed mobility comments: up in chair  Transfers Overall transfer level: Modified independent               General transfer comment: stood from chair no assist, no device    Balance Overall balance assessment: Mild deficits observed, not formally tested                                         ADL either performed or assessed with clinical judgement   ADL Overall ADL's : Needs assistance/impaired                 Upper Body Dressing : Supervision/safety;Sitting   Lower Body Dressing:  Supervision/safety;Sit to/from stand   Toilet Transfer: Modified Independent           Functional mobility during ADLs: Supervision/safety General ADL Comments: pt limited by weakness, decreased activity tolerance and cognition     Vision         Perception     Praxis      Pertinent Vitals/Pain Pain Assessment: No/denies pain Pain Score: 7  Pain Location: L ankle Pain Descriptors / Indicators: Sore;Tender Pain Intervention(s): Monitored during session;Limited activity within patient's tolerance;Repositioned     Hand Dominance     Extremity/Trunk Assessment Upper Extremity Assessment Upper Extremity Assessment: Generalized weakness   Lower Extremity Assessment Lower Extremity Assessment: Defer to PT evaluation LLE Deficits / Details: some edema present and tenderness lateral malleolus       Communication Communication Communication: No difficulties   Cognition Arousal/Alertness: Awake/alert Behavior During Therapy: Anxious Overall Cognitive Status: No family/caregiver present to determine baseline cognitive functioning                                 General Comments: patient oriented but hallucinating? reports seeing "chinese children and all those socks hanging" outside the window; RN reports this is basline; attempted short blessed test and patient reports "I just did this test on the tv a few days  ago and I am fine" then refused to complete remainder of test; noted poor awareness to deficits, safety and limited problem sovling   General Comments  Patient speaking with spouse upon my entry; stating he needs to come in to sign papers for the bill then she can meet him at entrance; RN reported she had been told d/c would be later today    Exercises     Shoulder Instructions      Home Living Family/patient expects to be discharged to:: Private residence Living Arrangements: Spouse/significant other Available Help at Discharge: Family Type of  Home: House Home Access: Stairs to enter [employer] of Steps: 2-3 Entrance Stairs-Rails: None Home Layout: One level     Bathroom Shower/Tub: Producer, television/film/video: Standard     Home Equipment: Cane - single point;Shower seat;Adaptive equipment Adaptive Equipment: Reacher;Sock aid        Prior Functioning/Environment Level of Independence: Independent        Comments: pt reports independent with ADLs, light IADLs         OT Problem List: Decreased strength;Decreased activity tolerance;Impaired balance (sitting and/or standing);Obesity;Decreased cognition      OT Treatment/Interventions: Self-care/ADL training;DME and/or AE instruction;Therapeutic activities;Patient/family education;Balance training    OT Goals(Current goals can be found in the care plan section) Acute Rehab OT Goals Patient Stated Goal: to go home today OT Goal Formulation: With patient  OT Frequency: Min 2X/week   Barriers to D/C:            Co-evaluation              AM-PAC OT "6 Clicks" Daily Activity     Outcome Measure Help from another person eating meals?: None Help from another person taking care of personal grooming?: A Little Help from another person toileting, which includes using toliet, bedpan, or urinal?: A Little Help from another person bathing (including washing, rinsing, drying)?: A Little Help from another person to put on and taking off regular upper body clothing?: A Little Help from another person to put on and taking off regular lower body clothing?: A Little 6 Click Score: 19   End of Session Nurse Communication: Mobility status;Other (comment)(cognition)  Activity Tolerance: Patient tolerated treatment well Patient left: in chair;with call bell/phone within reach  OT Visit Diagnosis: Other abnormalities of gait and mobility (R26.89);Other symptoms and signs involving cognitive function                Time: 1142-1155 OT Time Calculation  (min): 13 min Charges:  OT General Charges $OT Visit: 1 Visit OT Evaluation $OT Eval Low Complexity: 1 Low  Chancy Milroy, OT Acute Rehabilitation Services Pager [phone] Office [phone]   Chancy Milroy 07/27/2019, 12:28 PM

## 2019-07-27 NOTE — Discharge Instructions (Addendum)
Ms.Rigsby,  You came in with the following:  Nose bleed - Keep packing and rhino rocket in place. You should make appointment with Ear, Nose, and Throat physician listed on discharge or another ENT. You should be seen in a few days to have packing removed. Do not attempt to remove yourself.   Atrial Fibrillation - We increased your dose of Diltiazem to 360 mg , you were on 300 mg dose.  -We held your Eliquis in setting of nose bleed. Discuss restarting with your cardiologist and ENT doctor.  - Follow up with your cardiologist  Recommend follow up with Primary care physicain in the next 2 weeks.   My best,  Tamsen Snider, MD

## 2019-07-27 NOTE — Evaluation (Signed)
Physical Therapy Evaluation Patient Details Name: Amy Wright MRN: [id number] DOB: 08-Jul-1941 Today's Date: 07/27/2019   History of Present Illness  Darlene Brozowski is a 78 year old female with past medical history significant for HTN, HLD, persistent A. fib (on Eliquis and Tikosyn) who presented to the ED with epistasis found to be in A. fib with RVR.  Clinical Impression  Patient presents with decreased safety with mobility and decreased activity tolerance.  Seems preoccupied with plans for d/c asap despite education from RN/MD plans for later today.  Feel she continues with fall risk though likely walker vs cane could sufficiently reduce fall risk.  Patient wants to get the walker on her own.  Feel she will benefit from follow up HHPT to maximize safety, independence and improve activity tolerance reducing risk for readmission.  Will sign off as anticipate d/c home today.     Follow Up Recommendations Home health PT    Equipment Recommendations  None recommended by PT(pt prefers to get walker on her own so she can look at different models)    Recommendations for Other Services       Precautions / Restrictions Precautions Precautions: Fall Precaution Comments: L nare with packing Restrictions Weight Bearing Restrictions: No      Mobility  Bed Mobility               General bed mobility comments: up in chair  Transfers Overall transfer level: Modified independent               General transfer comment: stood from chair no assist, no device  Ambulation/Gait Ambulation/Gait assistance: Supervision;Min guard Gait Distance (Feet): 22 Feet Assistive device: Rolling walker (2 wheeled) Gait Pattern/deviations: Step-to pattern;Step-through pattern;Wide base of support     General Gait Details: slow and hesitant wanting to turn around prior to even getting to door of the room stating feeling tired; encouraged to at least go to the door due to needing to see if can  go home; walked short distance (end of bed to recliner) without device with close S  Stairs            Wheelchair Mobility    Modified Rankin (Stroke Patients Only)       Balance Overall balance assessment: Mild deficits observed, not formally tested                                           Pertinent Vitals/Pain Pain Assessment: 0-10 Pain Score: 7  Pain Location: L ankle Pain Descriptors / Indicators: Sore;Tender Pain Intervention(s): Monitored during session;Limited activity within patient's tolerance;Repositioned    Home Living Family/patient expects to be discharged to:: Private residence Living Arrangements: Spouse/significant other Available Help at Discharge: Family Type of Home: House Home Access: Stairs to enter Entrance Stairs-Rails: None Entrance Stairs-Number of Steps: 2-3 Home Layout: One level Home Equipment: Cane - single point;Shower seat;Adaptive equipment      Prior Function Level of Independence: Independent         Comments: has not been able to do housework or cooking since dealing with heart issues     Hand Dominance        Extremity/Trunk Assessment   Upper Extremity Assessment Upper Extremity Assessment: Generalized weakness    Lower Extremity Assessment Lower Extremity Assessment: Generalized weakness;LLE deficits/detail LLE Deficits / Details: some edema present and tenderness lateral malleolus  Communication   Communication: No difficulties  Cognition Arousal/Alertness: Awake/alert Behavior During Therapy: Anxious Overall Cognitive Status: No family/caregiver present to determine baseline cognitive functioning                                 General Comments: decreased safety awareness, decreased problem solving ready to leave asap despite being told by MD and RN it would be later today      General Comments General comments (skin integrity, edema, etc.): Patient speaking with  spouse upon my entry; stating he needs to come in to sign papers for the bill then she can meet him at entrance; RN reported she had been told d/c would be later today    Exercises     Assessment/Plan    PT Assessment All further PT needs can be met in the next venue of care  PT Problem List Decreased activity tolerance;Decreased mobility;Decreased knowledge of use of DME;Decreased safety awareness;Pain       PT Treatment Interventions      PT Goals (Current goals can be found in the Care Plan section)  Acute Rehab PT Goals Patient Stated Goal: to go home today PT Goal Formulation: All assessment and education complete, DC therapy    Frequency     Barriers to discharge        Co-evaluation               AM-PAC PT "6 Clicks" Mobility  Outcome Measure Help needed turning from your back to your side while in a flat bed without using bedrails?: None Help needed moving from lying on your back to sitting on the side of a flat bed without using bedrails?: A Little Help needed moving to and from a bed to a chair (including a wheelchair)?: None Help needed standing up from a chair using your arms (e.g., wheelchair or bedside chair)?: None Help needed to walk in hospital room?: A Little Help needed climbing 3-5 steps with a railing? : A Little 6 Click Score: 21    End of Session   Activity Tolerance: Patient limited by fatigue Patient left: in chair;with call bell/phone within reach Nurse Communication: Other (comment) PT Visit Diagnosis: Muscle weakness (generalized) (M62.81);Other abnormalities of gait and mobility (R26.89);History of falling (Z91.81)    Time: 4193-7902 PT Time Calculation (min) (ACUTE ONLY): 24 min   Charges:   PT Evaluation $PT Eval Low Complexity: 1 Low PT Treatments $Gait Training: 8-22 mins        Magda Kiel, [place] Acute Rehabilitation Services [phone] 07/27/2019   Reginia Naas 07/27/2019, 10:55 AM

## 2019-07-27 NOTE — Progress Notes (Signed)
NT went into patient's room to do vitals and to ask the patient if she wanted to get bathed. NT found the patient scooted to the bottom edge of the bed. Patient was going to try to get up and go to the bathroom. Patient was advised that she should have called for help.   Patient was advised earlier that since the patient stated that she since she was unable to ambulate that it would be safer for her to have the [place]. Patient agreed at that time.   Patient was advised by staff that since she "fell" in the ED by the EMT, it would be safer for her to call if she needed any help. Patient was in agreement.   Patient told NT that they had a video of the episode in the ED.  Bed alarm is on.

## 2019-07-27 NOTE — Progress Notes (Addendum)
   Subjective: Patient sitting up in chair this morning.  She continues to have soreness in her shoulders.  Reports noticing her left ankle is sore. Patient states prior to admission she was ambulating well around her home by herself.  Objective:  Vital signs in last 24 hours: Vitals:   07/27/19 0000 07/27/19 0052 07/27/19 0119 07/27/19 0452  BP: (!) 143/102  (!) 167/96 (!) 144/66  Pulse:    (!) 106  Resp:  16 13 14   Temp:    98.1 F (36.7 C)  TempSrc:    Oral  SpO2:  96% 95% 97%  Weight:      Height:       Gen: Obese, NAD HEENT: Rhino rocket and packing in left nare . [place]/AT , tender to palpation of neck muscles.  Cardiovascular: Rate controlled 90-100 bpm during exam, irregular rhythm, no murmurs Pulmonary: CTAB, no wheezes or rhonchi Abdominal: normal bowel sounds, non tender MSK: tender in lateral malleolus, non tender in medial malleolus and midfoot.   Assessment/Plan:  Active Problems:   Atrial fibrillation with RVR (HCC)   Left-sided epistaxis   Long term current use of anticoagulant therapy  Amy Wright is a 78 year old female with past medical history significant for HTN, HLD, persistent A. fib (on Eliquis and Tikosyn) who presented to the ED with epistasis found to be in A. fib with RVR.    #Epistaxis 25-month history of left nostril epistaxis.  Previous ED visit patient given Afrin with nasal packing.  Patient scheduled to see an ENT for this problem on 11/10, but read bad reviews so didn't keep appoinmtent.  Likely an anterior nose bleed , has stopped with packing. Will need follow up with ENT, referral put in by Dr. Jackalyn Lombard office. Dr.Newman's contact information is on discharge for patient to schedule an appointment as well.  - hold Eliquis, follow up with ENT - Continue Keflex per ENT recommendations  #A-fib w/RVR Patient on diltiazem drip at the rate of 12.5 mix per hour and switch to 90 mg of oral diltiazem every 6 hours.  In addition patient is on  metoprolol succinate 50 mg and Tikosyn 271mcg. Rate has been controlled through the night, <110. Holding Eliquis in setting of acute bleed. Follow up schedule for Friday with Dr.Allred, patient's cardiologist.  - Diltiazem 360 mg ER - Metoprolol succinate 50 mg - Tikosyn 250 mcg - Telemetry   #Fall, neck pain and left ankle pain - Patient reports falling in ED when moving from transport bed to hospital bed with help of EMS. C-spine without evidence of acute fracture.  - Tenderness in lateral malleolus , Xray of left ankle show no acute fracture. Patient had prior trauma and nonunion, she reports this on exam. Imaging shows soft tissue swelling.  -PT/OT  #T2DM Patient takes Metformin 1000 mg daily, glipizide 10 mg daily and Lantus 50 units nightly -SSI -Lantus 35  #HLD -Ezetimibe 10 mg daily  #Anxiety - Continue home Zoloft, ativan, and ambien  Dispo: Anticipated discharge today.   Tamsen Snider, MD PGY1  [phone]

## 2019-07-27 NOTE — Progress Notes (Signed)
CPAP at bedside.  Pt has had a nose bleed and has nasal packing in place.  RN notified MD.  MD agreed to hold off CPAP for now.

## 2019-07-27 NOTE — TOC Transition Note (Signed)
Transition of Care [facility]) - CM/SW Discharge Note Marvetta Gibbons RN, BSN Transitions of Care Unit 4E- RN Case Manager [phone]   Patient Details  Name: Amy Wright MRN: [id number] Date of Birth: [birth year]  [facility] Care [facility]) CM/SW Contact:  Dawayne Patricia, RN Phone Number: 07/27/2019, 11:56 AM   Clinical Narrative:    Pt stable today for transition home with spouse- nose packing to stay in place. Notified by PT that they are recommending HHPT- MD to place orders- CM spoke with pt at bedside pt appears very anxious over discharge and making sure she gets home so she can get back on schedule with her medications. Discussed PT recommendations- however pt states she "just can't think about that right now" she wants to go home first and figure out if she really needs [facility] services. List provided to pt Per CMS guidelines from medicare.gov website with star ratings (copy placed in shadow chart)- discussed how referral on discharge works and then after pt home she would need to f/u with her PCP for [facility] needs if she decided she wanted them. Pt declines HH referral at this time. She reports she has DME at home. Per bedside RN pt has been confused at times this am and has refused test and procedures at times- RN recommends to call family to let them know pt has refused [place] just so they are aware and understand how to f/u also. Calls made to both spouse and daughter Kennyth Lose [phone]) to discuss pt's refusal of [place] services and recommendations made by PT. Both spouse and daughter voiced appreciation and will speak with pt regarding [place] and f/u with PCP if they feel pt would benefit.    Final next level of care: [place] Barriers to Discharge: No Barriers Identified   Patient Goals and CMS Choice Patient states their goals for this hospitalization and ongoing recovery are:: want to get home and get my meds straightened out CMS Medicare.gov Compare Post Acute Care list  provided to:: Patient Choice offered to / list presented to : Patient, Spouse  Discharge Placement               home /self care        Discharge Plan and Services     Post Acute Care Choice: Home Health          DME Arranged: N/A DME Agency: NA       HH Arranged: PT, Patient Refused HH          Social Determinants of Health (SDOH) Interventions     Readmission Risk Interventions Readmission Risk Prevention Plan 07/27/2019 07/26/2019  Post Dischage Appt - Complete  Medication Screening - Complete  Transportation Screening Complete Complete  PCP or Specialist Appt within 5-7 Days Complete -  Home Care Screening Complete -  Medication Review (RN CM) Complete -  Some recent data might be hidden

## 2019-07-27 NOTE — Progress Notes (Signed)
Order received to discharge patient.  Telemetry monitor removed and CCMD notified.  PIV access removed.  Discharge instructions, follow up, medications and instructions for their use discussed with patient. 

## 2019-07-28 ENCOUNTER — Ambulatory Visit (INDEPENDENT_AMBULATORY_CARE_PROVIDER_SITE_OTHER): Payer: Medicare Other | Admitting: Otolaryngology

## 2019-07-28 ENCOUNTER — Encounter (INDEPENDENT_AMBULATORY_CARE_PROVIDER_SITE_OTHER): Payer: Self-pay | Admitting: Otolaryngology

## 2019-07-28 ENCOUNTER — Other Ambulatory Visit: Payer: Self-pay

## 2019-07-28 VITALS — Temp 97.2°F

## 2019-07-28 DIAGNOSIS — R040 Epistaxis: Secondary | ICD-10-CM | POA: Diagnosis not present

## 2019-07-28 NOTE — Progress Notes (Addendum)
HPI: Amy Wright is a 78 y.o. female who presents is referred by ED for evaluation of left-sided epistaxis.  She apparently has had for 5 nosebleeds over the past few months.  She developed a bad nosebleed on Monday and was seen in the ED with a had a pack a balloon nasal packing in order to stop the nosebleed on the left side.  Because of health history and history of obstructive sleep apnea she was admitted for observation and was subsequent discharged the following day.  She has had no further bleeding from the left side since the balloon packing was placed.  She is referred here to have it removed. She is on Eliquis because of chronic A. fib.  She used to take Coumadin.. Last hemoglobin 2 days ago was 9.3.  Past Medical History:  Diagnosis Date  . Anxiety   . Chronic insomnia   . Hyperlipidemia   . Hypertension   . Hypertriglyceridemia   . Obesity   . OSA on CPAP   . Persistent atrial fibrillation ([place]) 11/07/14   Chads2vasc score of at least 4  . Type II diabetes mellitus ([place])   . 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 Central jet. Aortic Valve appears to be mildly sclerotic. Trace aortic regurgitation.EF >55%   Past Surgical History:  Procedure Laterality Date  . APPENDECTOMY  1971  . CARDIOVERSION N/A 06/23/2013   Procedure: CARDIOVERSION;  Surgeon: Troy Sine, MD;  Location: [facility];  Service: Cardiovascular;  Laterality: N/A;  . CATARACT EXTRACTION W/ INTRAOCULAR LENS IMPLANT Bilateral   . [place]  . PLACEMENT OF BREAST IMPLANTS Bilateral 1985   Social History   Socioeconomic History  . Marital status: Married    Spouse name: Not on file  . Number of children: Not on file  . Years of education: Not on file  . Highest education level: Not on file  Occupational History  . Not on file  Social Needs  . Financial resource strain: Not on file  . Food insecurity    Worry: Not on file    Inability:  Not on file  . Transportation needs    Medical: Not on file    Non-medical: Not on file  Tobacco Use  . Smoking status: Passive Smoke Exposure - Never Smoker  . Smokeless tobacco: Never Used  Substance and Sexual Activity  . Alcohol use: Yes    Comment: 08/09/2013 "nothing to drink in >4 yrs; never had a problem w/it"  . Drug use: No  . Sexual activity: Not Currently  Lifestyle  . Physical activity    Days per week: Not on file    Minutes per session: Not on file  . Stress: Not on file  Relationships  . Social Herbalist on phone: Not on file    Gets together: Not on file    Attends religious service: Not on file    Active member of club or organization: Not on file    Attends meetings of clubs or organizations: Not on file    Relationship status: Not on file  Other Topics Concern  . Not on file  Social History Narrative  . Not on file   Family History  Problem Relation Age of Onset  . Heart attack Mother   . Cardiomyopathy Mother   . Cancer - Lung Father   . Cancer - Other Maternal Grandmother   . Stroke Paternal Grandfather   .  Diabetes Maternal Grandfather    Allergies  Allergen Reactions  . Rosuvastatin Calcium Other (See Comments)    myalgias   . Statins Other (See Comments)    myalgia  . Amoxicillin Rash  . Atorvastatin Other (See Comments)    Back pain   . Ezetimibe-Simvastatin Other (See Comments)    Back pain   . Penicillins Itching, Swelling and Rash    Did it involve swelling of the face/tongue/throat, SOB, or low BP? No Did it involve sudden or severe rash/hives, skin peeling, or any reaction on the inside of your mouth or nose? Yes Did you need to seek medical attention at a hospital or doctor's office? No When did it last happen?young adult If all above answers are "NO", may proceed with cephalosporin use.   Prior to Admission medications   Medication Sig Start Date End Date Taking? Authorizing Provider  ACCU-CHEK COMPACT PLUS  test strip CHECK BLOOD SUGAR TWICE DAILY OR AS DIRECTED 02/27/16  Yes [provider]  acetaminophen (TYLENOL) 500 MG tablet Take 500 mg by mouth every 6 (six) hours as needed for headache (pain).   Yes [provider]  Ascorbic Acid (VITAMIN C) 1000 MG tablet Take 3,000 mg by mouth daily.    Yes [provider]  cephALEXin (KEFLEX) 500 MG capsule Take 1 capsule (500 mg total) by mouth every 12 (twelve) hours for 4 days. 07/26/19 07/30/19 Yes Albertha Ghee, MD  cholecalciferol (VITAMIN D3) 25 MCG (1000 UT) tablet Take 1,000 Units by mouth daily.   Yes [provider]  Choline Fenofibrate (TRILIPIX) 135 MG capsule Take 135 mg by mouth every morning.    Yes [provider]  diltiazem (CARDIZEM CD) 360 MG 24 hr capsule Take 1 capsule (360 mg total) by mouth every morning. 07/26/19 08/25/19 Yes Albertha Ghee, MD  diltiazem (CARDIZEM CD) 360 MG 24 hr capsule Take 1 capsule (360 mg total) by mouth daily. 07/28/19 08/27/19 Yes Albertha Ghee, MD  dofetilide (TIKOSYN) 250 MCG capsule Take 1 capsule (250 mcg total) by mouth 2 (two) times daily. Please keep upcoming appt for future refills. Thank you 05/16/19  Yes Kroeger, Dot Lanes M., PA-C  ezetimibe (ZETIA) 10 MG tablet Take 10 mg by mouth at bedtime.    Yes [provider]  folic acid (FOLVITE) 1 MG tablet Take 1 mg by mouth every morning.    Yes [provider]  glipiZIDE (GLUCOTROL) 10 MG tablet Take 10 mg by mouth every morning.  03/24/17  Yes [provider]  Insulin Glargine (LANTUS SOLOSTAR) 100 UNIT/ML Solostar Pen Inject 50 Units into the skin daily before supper.   Yes [provider]  LORazepam (ATIVAN) 0.5 MG tablet Take 0.5 mg by mouth daily as needed for anxiety (jitters).    Yes [provider]  magnesium oxide (MAG-OX) 400 MG tablet Take 1 tablet (400 mg total) by mouth daily. Patient taking differently: Take 400 mg by mouth every morning.  02/14/16  Yes Seiler,  Amber K, NP  Melatonin 3 MG TABS Take 3 mg by mouth at bedtime.    Yes [provider]  metFORMIN (GLUCOPHAGE) 1000 MG tablet Take 1,000 mg by mouth 2 (two) times daily with a meal.   Yes [provider]  metoprolol succinate (TOPROL-XL) 50 MG 24 hr tablet Take 50 mg by mouth daily.  03/10/17  Yes [provider]  potassium chloride (KLOR-CON) 10 MEQ tablet Take 1 tablet (10 mEq total) by mouth daily. Please make overdue appt  with Dr. Johney FrameAllred before anymore refills. 2nd attempt Patient taking differently: Take 10 mEq by mouth every morning. Please make overdue appt with Dr. Johney FrameAllred before anymore refills. 2nd attempt 07/21/19  Yes Allred, Fayrene FearingJames, MD  sertraline (ZOLOFT) 100 MG tablet Take 150 mg by mouth every morning.  02/04/17  Yes [provider]  sodium chloride (AYR) 0.65 % nasal spray Place 1 spray into the nose as needed for congestion.   Yes [provider]  vitamin B-12 (CYANOCOBALAMIN) 100 MCG tablet Take 100 mcg by mouth every morning.    Yes [provider]  VITAMIN E PO Take 1 capsule by mouth once a week.   Yes [provider]  zolpidem (AMBIEN CR) 6.25 MG CR tablet Take 6.25 mg by mouth at bedtime.  09/26/14  Yes [provider]     Positive ROS: She complains of some soreness of her nose  All other systems have been reviewed and were otherwise negative with the exception of those mentioned in the HPI and as above.  Physical Exam: Constitutional: Alert, well-appearing, no acute distress Ears: External ears without lesions or tenderness. Ear canals are clear bilaterally with intact, clear TMs. Tuning forks reveal symmetric hearing bilaterally. Nasal: She has balloon packing in the left nasal cavity.  No active bleeding noted around this.  The balloon was removed.  Patient had no further bleeding.  She had a small nodule on the left mid septum which I suspect is the etiology of her nosebleed but there is no bleeding  noted and this was not cauterized in the office.  Remaining nasal cavity was clear.  I suspect this was an anterior nosebleed.  As she has had several nosebleeds over the past several months that she was able to stop with pressure. Oral: Lips and gums without lesions. Tongue and palate mucosa without lesions. Posterior oropharynx clear. Neck: No palpable adenopathy or masses Respiratory: Breathing comfortably  Skin: No facial/neck lesions or rash noted.  Procedures  Assessment: Left-sided epistaxis  Plan: Discussed with her concerning using cotton ball anterior packing if she has any further nosebleeds and use of either the nasal gel which she uses that seems to help or use of Afrin. She will follow-up here for cauterization if she has any further epistaxis as needed.   Narda Bondshris Newman, MD   CC:

## 2019-07-28 NOTE — Discharge Summary (Signed)
Name: Amy Wright MRN: [id number] DOB: 06-Nov-1940 78 y.o. PCP: Angelica Chessman, MD  Date of Admission: 07/25/2019  5:33 PM Date of Discharge: 07/27/2019 Attending Physician: Carlynn Purl C  Discharge Diagnosis: 1. Epistaxis  2. Atrial Fibrillation with RVR  Discharge Medications: Allergies as of 07/27/2019      Reactions   Rosuvastatin Calcium Other (See Comments)   myalgias   Statins Other (See Comments)   myalgia   Amoxicillin Rash   Atorvastatin Other (See Comments)   Back pain   Ezetimibe-simvastatin Other (See Comments)   Back pain   Penicillins Itching, Swelling, Rash   Did it involve swelling of the face/tongue/throat, SOB, or low BP? No Did it involve sudden or severe rash/hives, skin peeling, or any reaction on the inside of your mouth or nose? Yes Did you need to seek medical attention at a hospital or doctor's office? No When did it last happen?young adult If all above answers are NO, may proceed with cephalosporin use.      Medication List    STOP taking these medications   apixaban 5 MG Tabs tablet Commonly known as: Eliquis     TAKE these medications   Accu-Chek Compact Plus test strip Generic drug: glucose blood CHECK BLOOD SUGAR TWICE DAILY OR AS DIRECTED   acetaminophen 500 MG tablet Commonly known as: TYLENOL Take 500 mg by mouth every 6 (six) hours as needed for headache (pain).   Ayr 0.65 % nasal spray Generic drug: sodium chloride Place 1 spray into the nose as needed for congestion.   cephALEXin 500 MG capsule Commonly known as: KEFLEX Take 1 capsule (500 mg total) by mouth every 12 (twelve) hours for 4 days.   cholecalciferol 25 MCG (1000 UT) tablet Commonly known as: VITAMIN D3 Take 1,000 Units by mouth daily.   diltiazem 360 MG 24 hr capsule Commonly known as: CARDIZEM CD Take 1 capsule (360 mg total) by mouth every morning. What changed:   medication strength  how much to take   diltiazem 360 MG 24 hr  capsule Commonly known as: CARDIZEM CD Take 1 capsule (360 mg total) by mouth daily. What changed: You were already taking a medication with the same name, and this prescription was added. Make sure you understand how and when to take each.   dofetilide 250 MCG capsule Commonly known as: TIKOSYN Take 1 capsule (250 mcg total) by mouth 2 (two) times daily. Please keep upcoming appt for future refills. Thank you   ezetimibe 10 MG tablet Commonly known as: ZETIA Take 10 mg by mouth at bedtime.   folic acid 1 MG tablet Commonly known as: FOLVITE Take 1 mg by mouth every morning.   glipiZIDE 10 MG tablet Commonly known as: GLUCOTROL Take 10 mg by mouth every morning.   Lantus SoloStar 100 UNIT/ML Solostar Pen Generic drug: Insulin Glargine Inject 50 Units into the skin daily before supper.   LORazepam 0.5 MG tablet Commonly known as: ATIVAN Take 0.5 mg by mouth daily as needed for anxiety (jitters).   magnesium oxide 400 MG tablet Commonly known as: MAG-OX Take 1 tablet (400 mg total) by mouth daily. What changed: when to take this   Melatonin 3 MG Tabs Take 3 mg by mouth at bedtime.   metFORMIN 1000 MG tablet Commonly known as: GLUCOPHAGE Take 1,000 mg by mouth 2 (two) times daily with a meal.   metoprolol succinate 50 MG 24 hr tablet Commonly known as: TOPROL-XL Take 50 mg by mouth daily.  potassium chloride 10 MEQ tablet Commonly known as: KLOR-CON Take 1 tablet (10 mEq total) by mouth daily. Please make overdue appt with Dr. Rayann Heman before anymore refills. 2nd attempt What changed: when to take this   sertraline 100 MG tablet Commonly known as: ZOLOFT Take 150 mg by mouth every morning.   Trilipix 135 MG capsule Generic drug: Choline Fenofibrate Take 135 mg by mouth every morning.   vitamin B-12 100 MCG tablet Commonly known as: CYANOCOBALAMIN Take 100 mcg by mouth every morning.   vitamin C 1000 MG tablet Take 3,000 mg by mouth daily.   VITAMIN E  PO Take 1 capsule by mouth once a week.   zolpidem 6.25 MG CR tablet Commonly known as: AMBIEN CR Take 6.25 mg by mouth at bedtime.       Disposition and follow-up:   Ms.Elen Reller was [facility] from [facility] in Stable condition.  At the hospital follow up visit please address:  1.  # Epistaxis  Patient seen by ENT, Dr.Newman day after DC for removal of packing. Likely anterior nosebleed which has resolved. If bleeds again she was given instructions on what to do per note. - Eliquis held with bleed, consider restarting, CHADSVASC 5.  #A-fib with RVR - increased Diltiazem from 300 to 360 mg. - Continued metoprolol succinate 50 mg - Continued Tikosyn 250 mcg  Patient in A-fib, rate controlled (90-100 bpm) on DC , follow-up on Friday with Dr.Allred  #Fall, neck pain and left ankle pain - assess for resolution of pain and increased mobility with home health PT      2.  Labs / imaging needed at time of follow-up: ,   3.  Pending labs/ test needing follow-up:   Follow-up Appointments: Follow-up Information    Robyne Peers, MD Follow up.   Specialty: Family Medicine Contact information: [address] [place] [address] [phone]        [facility]. Call in 1 day(s).   Specialty: Otolaryngology Why: Make appointment to be seen in approximately 48 hrs to have packing and rhino rocket removed.  Contact information: [place] [address] [phone]       Thompson Grayer, MD Follow up in 1 week(s).   Specialty: Cardiology Contact information: [place] [address] [phone]           Hospital Course by problem list: 1.  #Epistaxis 9-month history of left nostril epistaxis.  Previous ED visit patient given Afrin with nasal packing.  Patient scheduled to see an ENT for this problem on 11/10, but read bad reviews so didn't keep appoinmtent.   Likely an anterior nose bleed , has stopped with packing. Dr.Newman's contact information is on discharge for patient to schedule an appointment. - hold Eliquis, follow up with ENT - Continue Keflex per ENT recommendations  #A-fib w/RVR Patient on diltiazem drip at the rate of 12.5 mix per hour and switch to 90 mg of oral diltiazem every 6 hours.  In addition patient is on metoprolol succinate 50 mg and Tikosyn 233mcg. Rate has been controlled through the night, <110. Holding Eliquis in setting of acute bleed. Follow up schedule for Friday with Dr.Allred, patient's cardiologist.  - Diltiazem 360 mg ER - Metoprolol succinate 50 mg - Tikosyn 250 mcg - Telemetry   #Fall, neck pain and left ankle pain - Patient reports falling in ED when moving from transport bed to hospital bed  with help of EMS. C-spine without evidence of acute fracture.  - Tenderness in lateral malleolus , Xray of left ankle show no acute fracture. Patient had prior trauma and nonunion, she reports this on exam. Imaging shows soft tissue swelling.  -PT/OT, recommended home health PT  #T2DM Patient takes Metformin 1000 mg daily, glipizide 10 mg daily and Lantus 50 units nightly -SSI -Lantus 35 - Continue home regimen at discharge  #HLD -Ezetimibe 10 mg daily  #Anxiety - Continue home Zoloft, ativan, and ambien  Discharge Vitals:   BP 113/68 (BP Location: Left Arm)    Pulse 98    Temp 98.1 F (36.7 C) (Oral)    Resp 18    Ht 5\' 9"  (1.753 m)    Wt 118.6 kg    SpO2 98%    BMI 38.61 kg/m   11/18 C-spine  FINDINGS: There is no acute fracture or subluxation of the cervical spine. The visualized posterior elements and odontoid appear intact. There is anatomic alignment of the lateral masses of C1 and C2. There are multilevel degenerative changes with endplate irregularity and disc space narrowing and osteophyte most prominent at C5-C6 and C6-C7. Probable associated mild neural foramina narrowing at C5-C6  and C6-C7 on the left. The soft tissues are unremarkable.  IMPRESSION: 1. No acute fracture or subluxation of the cervical spine. 2. Multilevel degenerative changes.  11/18 Ankle Complete Left  FINDINGS: Mild tarsal degenerative changes are noted. No acute fracture or dislocation is seen. Well corticated bony densities are noted adjacent to the medial malleolus consistent with prior trauma and nonunion. Generalized soft tissue swelling about the ankle is seen.  IMPRESSION: Soft tissue swelling.  Chronic changes without acute abnormality.   Pertinent Labs, Studies, and Procedures:  CBC Latest Ref Rng & Units 07/27/2019 07/26/2019 07/25/2019  WBC 4.0 - 10.5 K/uL 12.3(H) 10.2 12.8(H)  Hemoglobin 12.0 - 15.0 g/dL 6.2(Z9.3(L) 3.0(Q9.2(L) 10.2(L)  Hematocrit 36.0 - 46.0 % 29.8(L) 29.5(L) 33.8(L)  Platelets 150 - 400 K/uL 435(H) 376 515(H)    BMP Latest Ref Rng & Units 07/26/2019 07/25/2019 07/03/2019  Glucose 70 - 99 mg/dL 657(Q257(H) 469(G315(H) 295(M199(H)  BUN 8 - 23 mg/dL 23 17 15   Creatinine 0.44 - 1.00 mg/dL 8.410.88 3.240.99 4.010.83  BUN/Creat Ratio 12 - 28 - - -  Sodium 135 - 145 mmol/L 139 139 134(L)  Potassium 3.5 - 5.1 mmol/L 4.4 4.6 4.3  Chloride 98 - 111 mmol/L 107 103 100  CO2 22 - 32 mmol/L 22 20(L) 24  Calcium 8.9 - 10.3 mg/dL 9.4 9.9 9.5   Discharge Instructions: Discharge Instructions    Call MD for:  difficulty breathing, headache or visual disturbances   Complete by: As directed    Call MD for:  extreme fatigue   Complete by: As directed    Call MD for:  persistant dizziness or light-headedness   Complete by: As directed    Diet - low sodium heart healthy   Complete by: As directed    Diet - low sodium heart healthy   Complete by: As directed    Increase activity slowly   Complete by: As directed    Increase activity slowly   Complete by: As directed       Signed:  Thurmon FairJeff Natanel Snavely, MD PGY1  [phone]

## 2019-07-29 ENCOUNTER — Ambulatory Visit (HOSPITAL_COMMUNITY): Payer: Medicare Other | Admitting: Nurse Practitioner

## 2019-07-30 ENCOUNTER — Other Ambulatory Visit: Payer: Self-pay | Admitting: Internal Medicine

## 2019-08-01 ENCOUNTER — Telehealth: Payer: Self-pay | Admitting: *Deleted

## 2019-08-01 NOTE — Telephone Encounter (Signed)
Pt called to verify instructions from AVS.  [facility] reviewed schedule and dosage for Rx listed to AVS and allowed pt time to ask questions.  Pt repeated each Rx with dosage and time,  Pt verbalized upcoming MD appointment where she will ask additional questions should they arise.  No further EDCM needs identified.

## 2019-08-09 NOTE — Progress Notes (Signed)
Cardiology Office Note Date:  08/10/2019  Patient ID:  Amy Wright 10/16/1940, MRN [id number] PCP:  Angelica Chessman, MD  Electrophysiologist:  Dr. Johney Frame    Chief Complaint:   post hospital visit  History of Present Illness: Amy Wright is a 78 y.o. female with history of HTN, HLD, DM, anxiety, and AFib.  She comes in today to be seen for Dr. Johney Frame.  Last seen by him April 2019  She was seen last for her AFib in the AFib clinic back in Nov 2019, at that time on Tikosyn, having some paroxysms of Afib, and planned for DCCV.  The pt had not wanted to consider ablation or alternative AAD at that time.  Aug 2020 the patient reached out wanting to switch from coumadin to Eliquis (note mentions coumadin was no longer bing made, presumably branded)  More recently she has been having difficulties with nose bleeds. Having had several in November.  07/25/2019 she was admitted to [facility] with her 8th nose bleed (all L sided).  She was holding her nose, was having some post nasal gtt/bleeding into the back of her throat making her cough/retch, w/associated lightheadedness, ? Fall ENT was called recommended afrin and nasal packing though she was also in AFib w/RVR and was admitted mainly for this it seems, started on dilt gtt. Seems she had been referred to an ENT out patient though had not yet seen. She was discharged 07/27/2019 off eliquis with close ENT follow up.  Her dilt was increased to 360mg  daily her metoprolol and tikosyn continued unchanged.  She saw ENT 07/28/2019, packing was removed, planned for cauterization of recurrent bleeding.  It is unclear by the note if she was resumed on her Eliquis.  She has not had any further bleeding and remains off Eliquis.  She feels weak since she has had the recurrent bleeding.  She says she has been in Afib at least 5 months, her AFib makes her feel like she has less energy in general, aware of her irregular heart beat, no CP, no overt SOB.   Though since her bleeding episodes (she reports "massive bleeding, so much has been unable to keep it controlled with pressure, was "hand fulls of blood with clots"  In the last few weeks with the bleeding she says she feels literally like all of her blood has been darined, she has no energy, gets tired very easily. No dizzy spells, no near syncope or sncope  Past Medical History:  Diagnosis Date   Anxiety    Chronic insomnia    Hyperlipidemia    Hypertension    Hypertriglyceridemia    Obesity    OSA on CPAP    Persistent atrial fibrillation (HCC) 11/07/14   Chads2vasc score of at least 4   Type II diabetes mellitus (HCC)    Valvular sclerosis 04/30/11   ECHO-Mitral Valve- mild to moderate mitral regurgitation. Central jet. Aortic Valve appears to be mildly sclerotic. Trace aortic regurgitation.EF >55%    Past Surgical History:  Procedure Laterality Date   APPENDECTOMY  1971   CARDIOVERSION N/A 06/23/2013   Procedure: CARDIOVERSION;  Surgeon: 06/25/2013, MD;  Location: [facility] ENDOSCOPY;  Service: Cardiovascular;  Laterality: N/A;   CATARACT EXTRACTION W/ INTRAOCULAR LENS IMPLANT Bilateral    CESAREAN SECTION  1971   PLACEMENT OF BREAST IMPLANTS Bilateral 1985    Current Outpatient Medications  Medication Sig Dispense Refill   ACCU-CHEK COMPACT PLUS test strip CHECK BLOOD SUGAR TWICE DAILY OR AS DIRECTED  0   acetaminophen (TYLENOL) 500 MG tablet Take 500 mg by mouth every 6 (six) hours as needed for headache (pain).     Ascorbic Acid (VITAMIN C) 1000 MG tablet Take 3,000 mg by mouth daily.      cholecalciferol (VITAMIN D3) 25 MCG (1000 UT) tablet Take 1,000 Units by mouth daily.     Choline Fenofibrate (TRILIPIX) 135 MG capsule Take 135 mg by mouth every morning.      diltiazem (CARDIZEM CD) 360 MG 24 hr capsule Take 1 capsule (360 mg total) by mouth every morning. 30 capsule 0   ezetimibe (ZETIA) 10 MG tablet Take 10 mg by mouth at bedtime.      folic acid  (FOLVITE) 1 MG tablet Take 1 mg by mouth every morning.      glipiZIDE (GLUCOTROL) 10 MG tablet Take 10 mg by mouth every morning.      Insulin Glargine (LANTUS SOLOSTAR) 100 UNIT/ML Solostar Pen Inject 50 Units into the skin daily before supper.     LORazepam (ATIVAN) 0.5 MG tablet Take 0.5 mg by mouth daily as needed for anxiety (jitters).      magnesium oxide (MAG-OX) 400 MG tablet Take 1 tablet (400 mg total) by mouth daily. (Patient taking differently: Take 400 mg by mouth every morning. ) 30 tablet 5   Melatonin 3 MG TABS Take 3 mg by mouth at bedtime.      metFORMIN (GLUCOPHAGE) 1000 MG tablet Take 1,000 mg by mouth 2 (two) times daily with a meal.     metoprolol succinate (TOPROL-XL) 50 MG 24 hr tablet Take 50 mg by mouth daily.   0   potassium chloride (KLOR-CON) 10 MEQ tablet Take 1 tablet (10 mEq total) by mouth daily. Please make overdue appt with Dr. Rayann Heman before anymore refills. 2nd attempt (Patient taking differently: Take 10 mEq by mouth every morning. Please make overdue appt with Dr. Rayann Heman before anymore refills. 2nd attempt) 15 tablet 0   sertraline (ZOLOFT) 100 MG tablet Take 150 mg by mouth every morning.      sodium chloride (AYR) 0.65 % nasal spray Place 1 spray into the nose as needed for congestion.     vitamin B-12 (CYANOCOBALAMIN) 100 MCG tablet Take 100 mcg by mouth every morning.      VITAMIN E PO Take 1 capsule by mouth once a week.     zolpidem (AMBIEN CR) 6.25 MG CR tablet Take 6.25 mg by mouth at bedtime.      No current facility-administered medications for this visit.     Allergies:   Rosuvastatin calcium, Statins, Amoxicillin, Atorvastatin, Ezetimibe-simvastatin, and Penicillins   Social History:  The patient  reports that she is a non-smoker but has been exposed to tobacco smoke. She has never used smokeless tobacco. She reports current alcohol use. She reports that she does not use drugs.   Family History:  The patient's family history  includes Cancer - Lung in her father; Cancer - Other in her maternal grandmother; Cardiomyopathy in her mother; Diabetes in her maternal grandfather; Heart attack in her mother; Stroke in her paternal grandfather.  ROS:  Please see the history of present illness.  All other systems are reviewed and otherwise negative.   PHYSICAL EXAM:  VS:  BP 128/72    Pulse 86    Ht 5\' 9"  (1.753 m)    Wt 264 lb (119.7 kg)    BMI 38.99 kg/m  BMI: Body mass index is 38.99 kg/m. Well nourished, well  developed, in no acute distress  HEENT: normocephalic, atraumatic  Neck: no JVD, carotid bruits or masses Cardiac: irreg-irreg; no significant murmurs, no rubs, or gallops Lungs:  CTA b/l, no wheezing, rhonchi or rales  Abd: soft, nontender MS: no deformity or atrophy Ext:  no edema  Skin: warm and dry, no rash Neuro:  No gross deficits appreciated Psych: euthymic mood, full affect   EKG:  Done today and reviewed by myself shows  AFib 86bpm, nonspecific ST/T changes, QT stable   09/09/2016: TTE Study Conclusions - Left ventricle: The cavity size was normal. There was mild   concentric hypertrophy. Systolic function was normal. The   estimated ejection fraction was in the range of 55% to 60%. Wall   motion was normal; there were no regional wall motion   abnormalities. Features are consistent with a pseudonormal left   ventricular filling pattern, with concomitant abnormal relaxation   and increased filling pressure (grade 2 diastolic dysfunction). - Aortic valve: There was trivial regurgitation. - Mitral valve: Mildly calcified annulus. - Left atrium: The atrium was severely dilated.   Recent Labs: 07/25/2019: ALT 17 07/26/2019: BUN 23; Creatinine, Ser 0.88; Potassium 4.4; Sodium 139 07/27/2019: Hemoglobin 9.3; Magnesium 1.9; Platelets 435  No results found for requested labs within last 8760 hours.   Estimated Creatinine Clearance: 75.2 mL/min (by C-G formula based on SCr of 0.88 mg/dL).   Wt  Readings from Last 3 Encounters:  08/10/19 264 lb (119.7 kg)  07/26/19 261 lb 7.5 oz (118.6 kg)  07/03/19 250 lb (113.4 kg)     Other studies reviewed: Additional studies/records reviewed today include: summarized above  ASSESSMENT AND PLAN:  1. Persistent AFib     CHA2DS2Vasc is 4 (5 w/gender)      She has been in Afib for about 5 months based on her symptoms, rate is fair She has been off Eliquis for weeks  We discussed at length stroke risk, her score and role of anticoagulation ENT did not discuss any specifics with her or in the note thoughts/recommendations for her a/c  She is most comfortable reconsidering coumadin/warfarin, less likely to ant to try alternative OAC, discused potential for re-bleed and ENT mention of cauterizing bleed if needed  I think she has failed Tikosyn. And being off a/c will stop it Discussed unable to consider rhythm control strategy via amiodarone or ablation until she is on a/c again uninterrupted without bleeding  She will reach out to her PMD about restarting warfarin, her thoughts and recommendations    2. HTN     looks OK here, she mentions her home BP much higher 160-170/80's a repeat here was 148/80     She is asked to keep a log of her BP and bring her cuff to her next visit with her home readings   3. Fatigue, DOE     Her Hgb dropped from 12 >> 9 in a month with her recurrent severe nose bleeding     Likely this is the acute worsening of her fatigue    Disposition: F/u with us in 4 weeks or so to revisit rhythm control, currently planned to start warfarin pending her d/w her PMD.    Current medicines are reviewed at length with the patient today.  The patient did not have any concerns regarding medicines.  Norma FredricksonSigned, Ashlin Hidalgo, PA-C 08/10/2019 4:19 PM     [facility] [address] [place] [address] [phone] (office)  [phone] (fax)

## 2019-08-10 ENCOUNTER — Other Ambulatory Visit: Payer: Self-pay

## 2019-08-10 ENCOUNTER — Ambulatory Visit: Payer: Medicare Other | Admitting: Physician Assistant

## 2019-08-10 VITALS — BP 128/72 | HR 86 | Ht 69.0 in | Wt 264.0 lb

## 2019-08-10 DIAGNOSIS — I4819 Other persistent atrial fibrillation: Secondary | ICD-10-CM | POA: Diagnosis not present

## 2019-08-10 DIAGNOSIS — I10 Essential (primary) hypertension: Secondary | ICD-10-CM | POA: Diagnosis not present

## 2019-08-10 NOTE — Patient Instructions (Addendum)
Medication Instructions:   STOP TAKING: TIKOSYN  PLEASE DISCUSS WITH YOUR PRIMARY CARE DOCTOR ABOUT RESTARTING COUMADIN   *If you need a refill on your cardiac medications before your next appointment, please call your pharmacy*  Lab Work: NONE ORDERED  TODAY   If you have labs (blood work) drawn today and your tests are completely normal, you will receive your results only by: Marland Kitchen MyChart Message (if you have MyChart) OR . A paper copy in the mail If you have any lab test that is abnormal or we need to change your treatment, we will call you to review the results.  Testing/Procedures:  NONE ORDERED  TODAY    Follow-Up: At [facility], you and your health needs are our priority.  As part of our continuing mission to provide you with exceptional heart care, we have created designated Provider Care Teams.  These Care Teams include your primary Cardiologist (physician) and Advanced Practice Providers (APPs -  Physician Assistants and Nurse Practitioners) who all work together to provide you with the care you need, when you need it.  Your next appointment:    4 week(s) WITH URSUY OR ALLRED   The format for your next appointment:   In Person  Provider:  Tommye Standard, PA-C   Other Instructions

## 2019-08-12 ENCOUNTER — Other Ambulatory Visit: Payer: Self-pay | Admitting: *Deleted

## 2019-08-12 ENCOUNTER — Other Ambulatory Visit: Payer: Self-pay | Admitting: Internal Medicine

## 2019-08-12 ENCOUNTER — Telehealth: Payer: Self-pay | Admitting: Physician Assistant

## 2019-08-12 MED ORDER — WARFARIN SODIUM 5 MG PO TABS: 5.0000 mg | ORAL_TABLET | Freq: Every day | ORAL | 3 refills | Status: DC

## 2019-08-12 NOTE — Telephone Encounter (Signed)
Pt calling stating that at her LOV with Tommye Standard, PA, that she wanted pt to start back on Warfarin. This medication is not on pt's medication list. I informed pt about Renee, PA, recommendation about talking to her PCP about starting this medication back and pt stated that her PCP would like for her cardiologist to do this. Pt would like a call back concerning this matter. Pt also stated that her pharmacy [facility] does not order this medication. Please address

## 2019-08-12 NOTE — Telephone Encounter (Signed)
Spoke with patient about starting Coumadin small dose of 5 mg once a day until appt 08-18-19 at  2:30 pm with Coumadin clinic to start INR checks. [facility]  Patient was also told to hold Coumadin if any nose bleeds reoccur along with holding Fenofibrate.

## 2019-08-16 ENCOUNTER — Other Ambulatory Visit: Payer: Self-pay | Admitting: Internal Medicine

## 2019-08-16 NOTE — Telephone Encounter (Signed)
Follow Up  Patient is calling in to confirm the right prescription was called in. Patient states that  Coumadin was supposed to be called in, but she was given warfarin instead. Please give patient a call back to confirm.

## 2019-08-16 NOTE — Telephone Encounter (Signed)
Pt called back to Coumadin clinic directly - asking for brand Coumadin. Advised her brand Coumadin is no longer being manufactured and she would need to take generic warfarin.  Pt states she has not picked up warfarin yet. Advised her to pick up rx and start taking 5mg  daily. Moved INR check from this Thursday to next Monday once pt has had at least 5 doses of warfarin.  Pt states she was also advised to hold her fenofibrate - unclear if this is just in the setting of bleeding. Will route to Renee to clarify and follow up with pt. From a warfarin standpoint, fenofibrate can increase INR slightly but would not want pt to hold fenofibrate now and restart later for this sole reason as it would make her INRs more labile.

## 2019-08-16 NOTE — Telephone Encounter (Signed)
°*  STAT* If patient is at the pharmacy, call can be transferred to refill team.   1. Which medications need to be refilled? (please list name of each medication and dose if known) warfarin (COUMADIN) 5 MG tablet  2. Which pharmacy/location (including street and city if local pharmacy) is medication to be sent to? [id number] [place] [place] [address]  3. Do they need a 30 day or 90 day supply? 30 day   Patient does not want generic brand.

## 2019-08-16 NOTE — Telephone Encounter (Signed)
Called patient  and lvm on her cell and husband cell  number due to home phone  has a block on calls incoming and left voicemail that warfarin is the generic for coumadin and that is okay to take .Patent was explained if they perfer coumadin that pharmacy will have to be contacted back to make those changes to see if they have the brand name Coumadin,

## 2019-08-16 NOTE — Telephone Encounter (Signed)
Pt has been made aware by Fuller Canada, PharmD.  See other telephone note in Epic from 08/12/19.

## 2019-08-16 NOTE — Telephone Encounter (Signed)
Pt is requesting name brand of the warfarin. Please address

## 2019-08-16 NOTE — Telephone Encounter (Signed)
Name brand Coumadin no longer available.  Only generic Warfarin.  Attempted to contact pt at home number, call blocking system would not allow call to go through.  Attempted to contact pt on her cell phone, LMOM with above info and for pt TCB with questions regarding this.

## 2019-08-22 ENCOUNTER — Other Ambulatory Visit: Payer: Self-pay

## 2019-08-22 ENCOUNTER — Ambulatory Visit (INDEPENDENT_AMBULATORY_CARE_PROVIDER_SITE_OTHER): Payer: Medicare Other | Admitting: *Deleted

## 2019-08-22 DIAGNOSIS — I4819 Other persistent atrial fibrillation: Secondary | ICD-10-CM

## 2019-08-22 DIAGNOSIS — Z79899 Other long term (current) drug therapy: Secondary | ICD-10-CM | POA: Insufficient documentation

## 2019-08-22 DIAGNOSIS — Z5181 Encounter for therapeutic drug level monitoring: Secondary | ICD-10-CM | POA: Insufficient documentation

## 2019-08-22 LAB — POCT INR: INR: 2.9 (ref 2.0–3.0)

## 2019-08-22 NOTE — Patient Instructions (Addendum)
Description   Take 1/2 a tablet daily except for 1 tablet on Tuesday, Thursday and Saturday. Call coumadin clinic for any changes in medications or up coming procedures. Coumadin Clinic: [phone].   A full discussion of the nature of anticoagulants has been carried out.   A benefit risk analysis has been presented to the patient, so that they understand the justification for choosing anticoagulation at this time. The need for frequent and regular monitoring, precise dosage adjustment and compliance is stressed.  Side effects of potential bleeding are discussed.  The patient should avoid any OTC items containing aspirin or ibuprofen, and should avoid great swings in general diet.  Avoid alcohol consumption.  Call if any signs of abnormal bleeding.

## 2019-09-14 ENCOUNTER — Telehealth: Payer: Self-pay

## 2019-09-19 ENCOUNTER — Encounter: Payer: Self-pay | Admitting: Internal Medicine

## 2019-09-19 ENCOUNTER — Telehealth (INDEPENDENT_AMBULATORY_CARE_PROVIDER_SITE_OTHER): Payer: Medicare Other | Admitting: Internal Medicine

## 2019-09-19 ENCOUNTER — Telehealth: Payer: Self-pay

## 2019-09-19 ENCOUNTER — Other Ambulatory Visit: Payer: Self-pay

## 2019-09-19 VITALS — BP 165/84 | HR 94 | Ht 69.0 in | Wt 225.0 lb

## 2019-09-19 DIAGNOSIS — Z9989 Dependence on other enabling machines and devices: Secondary | ICD-10-CM

## 2019-09-19 DIAGNOSIS — R040 Epistaxis: Secondary | ICD-10-CM | POA: Diagnosis not present

## 2019-09-19 DIAGNOSIS — D6869 Other thrombophilia: Secondary | ICD-10-CM

## 2019-09-19 DIAGNOSIS — I4819 Other persistent atrial fibrillation: Secondary | ICD-10-CM | POA: Diagnosis not present

## 2019-09-19 DIAGNOSIS — I10 Essential (primary) hypertension: Secondary | ICD-10-CM

## 2019-09-19 DIAGNOSIS — G4733 Obstructive sleep apnea (adult) (pediatric): Secondary | ICD-10-CM

## 2019-09-19 MED ORDER — METOPROLOL SUCCINATE ER 50 MG PO TB24: ORAL_TABLET | ORAL | 3 refills | Status: DC

## 2019-09-19 NOTE — Progress Notes (Signed)
Electrophysiology TeleHealth Note  Due to national recommendations of social distancing due to COVID 19, an audio telehealth visit is felt to be most appropriate for this patient at this time.  Verbal consent was obtained by me for the telehealth visit today.  The patient does not have capability for a virtual visit.  A phone visit is therefore required today.   Date:  09/19/2019   ID:  Amy Wright, DOB March 19, 1941, MRN [id number]  Location: patient's home  Provider location:  [facility]  Evaluation Performed: Follow-up visit  PCP:  Angelica Chessman, MD   Electrophysiologist:  Dr Johney Frame  Chief Complaint:  palpitations  History of Present Illness:    Amy Wright is a 79 y.o. female who presents via telehealth conferencing today.  Since last being seen in our clinic, the patient reports doing reasonably well. She has decreased exercise tolerance with her AF.  V rates are controlled.  + palpitations.  She has had epistaxis with eliquis previously.  She is now back on coumadin and doing ok. Today, she denies symptoms of chest pain, shortness of breath,  lower extremity edema, dizziness, presyncope, or syncope.  The patient is otherwise without complaint today.  The patient denies symptoms of fevers, chills, cough, or new SOB worrisome for COVID 19.  Past Medical History:  Diagnosis Date  . Anxiety   . Chronic insomnia   . Hyperlipidemia   . Hypertension   . Hypertriglyceridemia   . Obesity   . OSA on CPAP   . Persistent atrial fibrillation (HCC) 11/07/14   Chads2vasc score of at least 4  . Type II diabetes mellitus (HCC)   . 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 Central jet. Aortic Valve appears to be mildly sclerotic. Trace aortic regurgitation.EF >55%    Past Surgical History:  Procedure Laterality Date  . APPENDECTOMY  1971  . CARDIOVERSION N/A 06/23/2013   Procedure: CARDIOVERSION;  Surgeon: Lennette Bihari, MD;   Location: [facility] ENDOSCOPY;  Service: Cardiovascular;  Laterality: N/A;  . CATARACT EXTRACTION W/ INTRAOCULAR LENS IMPLANT Bilateral   . CESAREAN SECTION  1971  . PLACEMENT OF BREAST IMPLANTS Bilateral 1985    Current Outpatient Medications  Medication Sig Dispense Refill  . ACCU-CHEK COMPACT PLUS test strip CHECK BLOOD SUGAR TWICE DAILY OR AS DIRECTED  0  . acetaminophen (TYLENOL) 500 MG tablet Take 500 mg by mouth every 6 (six) hours as needed for headache (pain).    . Ascorbic Acid (VITAMIN C) 1000 MG tablet Take 3,000 mg by mouth daily.     . cholecalciferol (VITAMIN D3) 25 MCG (1000 UT) tablet Take 1,000 Units by mouth daily.    . Choline Fenofibrate (TRILIPIX) 135 MG capsule Take 135 mg by mouth every morning.     . ezetimibe (ZETIA) 10 MG tablet Take 10 mg by mouth at bedtime.     . folic acid (FOLVITE) 1 MG tablet Take 1 mg by mouth every morning.     Marland Kitchen glipiZIDE (GLUCOTROL) 10 MG tablet Take 10 mg by mouth every morning.     . Insulin Glargine (LANTUS SOLOSTAR) 100 UNIT/ML Solostar Pen Inject 50 Units into the skin daily before supper.    Marland Kitchen LORazepam (ATIVAN) 0.5 MG tablet Take 0.5 mg by mouth daily as needed for anxiety (jitters).     . magnesium oxide (MAG-OX) 400 MG tablet Take 1 tablet (400 mg total) by mouth daily. 30 tablet 5  . Melatonin 3  MG TABS Take 3 mg by mouth at bedtime.     . metFORMIN (GLUCOPHAGE) 1000 MG tablet Take 1,000 mg by mouth 2 (two) times daily with a meal.    . metoprolol succinate (TOPROL-XL) 50 MG 24 hr tablet Take 50 mg by mouth 2 (two) times daily.   0  . potassium chloride (KLOR-CON) 10 MEQ tablet Take 1 tablet (10 mEq total) by mouth daily. 90 tablet 3  . sertraline (ZOLOFT) 100 MG tablet Take 150 mg by mouth every morning.     . sodium chloride (AYR) 0.65 % nasal spray Place 1 spray into the nose as needed for congestion.    Marland Kitchen telmisartan (MICARDIS) 40 MG tablet Take 40 mg by mouth daily.    . vitamin B-12 (CYANOCOBALAMIN) 100 MCG tablet Take 100 mcg  by mouth every morning.     Marland Kitchen VITAMIN E PO Take 1 capsule by mouth once a week.    . warfarin (COUMADIN) 5 MG tablet Take 1 tablet (5 mg total) by mouth daily. 30 tablet 3  . zolpidem (AMBIEN CR) 6.25 MG CR tablet Take 6.25 mg by mouth at bedtime.     Marland Kitchen diltiazem (CARDIZEM CD) 360 MG 24 hr capsule Take 1 capsule (360 mg total) by mouth every morning. 30 capsule 0   No current facility-administered medications for this visit.    Allergies:   Rosuvastatin calcium, Statins, Amoxicillin, Atorvastatin, Ezetimibe-simvastatin, and Penicillins   Social History:  The patient  reports that she is a non-smoker but has been exposed to tobacco smoke. She has never used smokeless tobacco. She reports current alcohol use. She reports that she does not use drugs.   Family History:  The patient's family history includes Cancer - Lung in her father; Cancer - Other in her maternal grandmother; Cardiomyopathy in her mother; Diabetes in her maternal grandfather; Heart attack in her mother; Stroke in her paternal grandfather.   ROS:  Please see the history of present illness.   All other systems are personally reviewed and negative.    Exam:    Vital Signs:  BP (!) 165/84   Pulse 94   Ht 5\' 9"  (1.753 m)   Wt 225 lb (102.1 kg)   BMI 33.23 kg/m   Well sounding, alert and conversant  Labs/Other Tests and Data Reviewed:    Recent Labs: 07/25/2019: ALT 17 07/26/2019: BUN 23; Creatinine, Ser 0.88; Potassium 4.4; Sodium 139 07/27/2019: Hemoglobin 9.3; Magnesium 1.9; Platelets 435   Wt Readings from Last 3 Encounters:  09/19/19 225 lb (102.1 kg)  08/10/19 264 lb (119.7 kg)  07/26/19 261 lb 7.5 oz (118.6 kg)     ASSESSMENT & PLAN:    1.  Longstanding persistent afib with severe LA enlargment She has failed medical therapy with tikosyn.  Her sister had a "horrible" experience with amiodarone and thus she has declined this. Nose bleeds with eliquis but is tolerating coumadin now.  chads2vasc score is at  least 5. We discussed options of rate control and rhythm control at length today.  She is not a candidate for PVI and is not interested in amiodarone. She may do better with better rate control. I will therefore increase toporol 100mg  qam and 50mg  qpm. Could consider referral to Dr Orvan Seen for consideration of MAZE and LAA removal.  We did discuss today however she would like to avoid this if able.  2. HTN Stable No change required today  3. Obesity Body mass index is 33.23 kg/m. Lifestyle modification is  encouraged She has made some progress since I saw her last.  4. OSA Compliance with CPAP is encouraged  Follow-up:  AF clinic in 2 months   Patient Risk:  after full review of this patients clinical status, I feel that they are at high risk at this time.  Very complicated patient.  A high level of decision making was required for the visit today.   Today, I have spent 20 minutes with the patient with telehealth technology discussing arrhythmia management .    SignedHillis Range, MD  09/19/2019 2:10 PM     [facility] [address] [place] [address] [phone] (office) [phone] (fax)

## 2019-09-19 NOTE — Telephone Encounter (Signed)
Call placed to pt.  Advised of new medication instruction.  Advised new prescription was sent to her pharmacy.  Attempted to schedule 2 mo f/u with Afib clinic.  Per Pt she did not want to schedule today, states she will call when it gets closer.

## 2019-09-19 NOTE — Telephone Encounter (Signed)
-----   Message from Hillis Range, MD sent at 09/19/2019  2:22 PM EST -----  increase toporol 100mg  qam and 50mg  qpm. Follow-up in AF clinic in 2 months

## 2019-09-22 ENCOUNTER — Other Ambulatory Visit: Payer: Self-pay | Admitting: Internal Medicine

## 2019-09-23 ENCOUNTER — Other Ambulatory Visit: Payer: Self-pay | Admitting: Internal Medicine

## 2019-09-23 ENCOUNTER — Telehealth: Payer: Self-pay

## 2019-09-23 NOTE — Telephone Encounter (Signed)
Pt says she was give a script for diltiazem while she was at the hospital. Would [facility] be ok with filling this for her? Thank you.

## 2019-09-23 NOTE — Telephone Encounter (Signed)
Patient called asking for a refill for Diltiazem 240 mg. She was prescribed this medication while in the hospital by Dr. Barbaraann Faster. Pt asked if we would be willing to refill it at our office. Referred pt to ask her PCP since she has prescribed it before. Pt verbalized understanding and thanked me for my time.

## 2019-09-26 ENCOUNTER — Other Ambulatory Visit: Payer: Self-pay

## 2019-09-26 MED ORDER — DILTIAZEM HCL ER COATED BEADS 360 MG PO CP24: 360.0000 mg | ORAL_CAPSULE | Freq: Every morning | ORAL | 3 refills | Status: DC

## 2019-11-07 ENCOUNTER — Other Ambulatory Visit: Payer: Self-pay

## 2019-11-07 ENCOUNTER — Other Ambulatory Visit: Payer: Self-pay | Admitting: Physician Assistant

## 2019-11-07 ENCOUNTER — Ambulatory Visit (INDEPENDENT_AMBULATORY_CARE_PROVIDER_SITE_OTHER): Payer: Medicare Other | Admitting: Pharmacist

## 2019-11-07 DIAGNOSIS — Z5181 Encounter for therapeutic drug level monitoring: Secondary | ICD-10-CM | POA: Diagnosis not present

## 2019-11-07 DIAGNOSIS — I4819 Other persistent atrial fibrillation: Secondary | ICD-10-CM

## 2019-11-07 LAB — POCT INR: INR: 1.9 — AB (ref 2.0–3.0)

## 2019-11-07 NOTE — Telephone Encounter (Signed)
Hold until seen

## 2019-11-07 NOTE — Patient Instructions (Signed)
  Take warfarin 5mg  every day except for 2.5mg  every Monday, Wednesday,and  Friday. Repeat INR in 1 months Call coumadin clinic for any changes in medications or up coming procedures. Coumadin Clinic: [phone].

## 2019-12-19 ENCOUNTER — Ambulatory Visit (INDEPENDENT_AMBULATORY_CARE_PROVIDER_SITE_OTHER): Payer: Medicare Other | Admitting: Pharmacist

## 2019-12-19 ENCOUNTER — Other Ambulatory Visit: Payer: Self-pay

## 2019-12-19 DIAGNOSIS — Z5181 Encounter for therapeutic drug level monitoring: Secondary | ICD-10-CM | POA: Diagnosis not present

## 2019-12-19 DIAGNOSIS — I4819 Other persistent atrial fibrillation: Secondary | ICD-10-CM

## 2019-12-19 LAB — POCT INR: INR: 1.8 — AB (ref 2.0–3.0)

## 2020-01-04 ENCOUNTER — Other Ambulatory Visit: Payer: Self-pay | Admitting: Physician Assistant

## 2020-01-18 ENCOUNTER — Ambulatory Visit (INDEPENDENT_AMBULATORY_CARE_PROVIDER_SITE_OTHER): Payer: Medicare Other | Admitting: Pharmacist

## 2020-01-18 ENCOUNTER — Other Ambulatory Visit: Payer: Self-pay

## 2020-01-18 DIAGNOSIS — Z5181 Encounter for therapeutic drug level monitoring: Secondary | ICD-10-CM | POA: Diagnosis not present

## 2020-01-18 DIAGNOSIS — I4819 Other persistent atrial fibrillation: Secondary | ICD-10-CM | POA: Diagnosis not present

## 2020-01-18 LAB — POCT INR: INR: 2.8 (ref 2.0–3.0)

## 2020-03-09 ENCOUNTER — Ambulatory Visit (INDEPENDENT_AMBULATORY_CARE_PROVIDER_SITE_OTHER): Payer: Medicare Other | Admitting: Pharmacist Clinician (PhC)/ Clinical Pharmacy Specialist

## 2020-03-09 ENCOUNTER — Other Ambulatory Visit: Payer: Self-pay

## 2020-03-09 DIAGNOSIS — Z7901 Long term (current) use of anticoagulants: Secondary | ICD-10-CM | POA: Diagnosis not present

## 2020-03-09 DIAGNOSIS — Z5181 Encounter for therapeutic drug level monitoring: Secondary | ICD-10-CM | POA: Diagnosis not present

## 2020-03-09 DIAGNOSIS — I4819 Other persistent atrial fibrillation: Secondary | ICD-10-CM

## 2020-03-09 DIAGNOSIS — I4891 Unspecified atrial fibrillation: Secondary | ICD-10-CM | POA: Diagnosis not present

## 2020-03-09 LAB — POCT INR: INR: 4 — AB (ref 2.0–3.0)

## 2020-03-09 NOTE — Patient Instructions (Signed)
No warfarin today Friday July 2, then continue taking 5mg  every day except for 2.5mg  every Tuesday, Thursday and Saturday. Repeat INR in 3 weeks.   Call coumadin clinic for any changes in medications or up coming procedures. Coumadin Clinic: [phone].

## 2020-03-10 ENCOUNTER — Other Ambulatory Visit: Payer: Self-pay | Admitting: Physician Assistant

## 2020-03-13 NOTE — Telephone Encounter (Signed)
Drug-Drug: warfarin and Choline Fenofibrate The hypoprothrombinemic effect of Anticoagulants may be increased by Fibric Acid Derivatives. Bleeding may occur. Will route to pharmd pool for further review

## 2020-04-12 ENCOUNTER — Telehealth: Payer: Self-pay

## 2020-04-12 NOTE — Telephone Encounter (Signed)
lmom for missed inr 

## 2020-04-25 ENCOUNTER — Telehealth: Payer: Self-pay

## 2020-04-25 NOTE — Telephone Encounter (Signed)
lmom for overdue inr 

## 2020-04-26 ENCOUNTER — Telehealth: Payer: Self-pay

## 2020-04-26 NOTE — Telephone Encounter (Signed)
Overdue inr lmomed

## 2020-05-04 ENCOUNTER — Other Ambulatory Visit: Payer: Self-pay

## 2020-05-04 ENCOUNTER — Ambulatory Visit (INDEPENDENT_AMBULATORY_CARE_PROVIDER_SITE_OTHER): Payer: Medicare Other | Admitting: Pharmacist

## 2020-05-04 DIAGNOSIS — I4819 Other persistent atrial fibrillation: Secondary | ICD-10-CM

## 2020-05-04 DIAGNOSIS — Z5181 Encounter for therapeutic drug level monitoring: Secondary | ICD-10-CM | POA: Diagnosis not present

## 2020-05-04 DIAGNOSIS — Z7901 Long term (current) use of anticoagulants: Secondary | ICD-10-CM | POA: Diagnosis not present

## 2020-05-04 LAB — POCT INR: INR: 1.7 — AB (ref 2.0–3.0)

## 2020-05-04 NOTE — Patient Instructions (Addendum)
Take 1.5 tablets tonight and then continue 1 tablet every Sunday, Monday, Wednesday, Friday and 1/2 tablet all other days

## 2020-06-11 ENCOUNTER — Ambulatory Visit (INDEPENDENT_AMBULATORY_CARE_PROVIDER_SITE_OTHER): Payer: Medicare Other

## 2020-06-11 ENCOUNTER — Other Ambulatory Visit: Payer: Self-pay

## 2020-06-11 DIAGNOSIS — I4819 Other persistent atrial fibrillation: Secondary | ICD-10-CM

## 2020-06-11 DIAGNOSIS — Z5181 Encounter for therapeutic drug level monitoring: Secondary | ICD-10-CM | POA: Diagnosis not present

## 2020-06-11 LAB — POCT INR: INR: 1.4 — AB (ref 2.0–3.0)

## 2020-06-11 NOTE — Patient Instructions (Signed)
Take 2 tablets today and then increase to 1 tablet daily except 1/2 tablet Tuesday and Thursday.  INR check 2 weeks

## 2020-09-21 ENCOUNTER — Other Ambulatory Visit: Payer: Self-pay | Admitting: Internal Medicine

## 2020-09-21 MED ORDER — WARFARIN SODIUM 5 MG PO TABS: 5.0000 mg | ORAL_TABLET | Freq: Every day | ORAL | 0 refills | Status: DC

## 2020-10-13 ENCOUNTER — Other Ambulatory Visit: Payer: Self-pay | Admitting: Internal Medicine

## 2020-10-22 ENCOUNTER — Other Ambulatory Visit: Payer: Self-pay | Admitting: Internal Medicine

## 2020-11-05 ENCOUNTER — Encounter: Payer: Self-pay | Admitting: Internal Medicine

## 2020-11-13 ENCOUNTER — Other Ambulatory Visit: Payer: Self-pay | Admitting: Internal Medicine

## 2020-11-30 ENCOUNTER — Other Ambulatory Visit: Payer: Self-pay | Admitting: Internal Medicine

## 2020-12-01 ENCOUNTER — Other Ambulatory Visit: Payer: Self-pay | Admitting: Internal Medicine

## 2020-12-03 ENCOUNTER — Telehealth: Payer: Self-pay | Admitting: Internal Medicine

## 2020-12-03 ENCOUNTER — Ambulatory Visit: Payer: Medicare Other | Admitting: Internal Medicine

## 2020-12-03 MED ORDER — DILTIAZEM HCL ER COATED BEADS 360 MG PO CP24: ORAL_CAPSULE | ORAL | 0 refills | Status: DC

## 2020-12-03 NOTE — Telephone Encounter (Signed)
Pt's medication was sent to pt's pharmacy as requested. Confirmation received.  °

## 2020-12-03 NOTE — Telephone Encounter (Signed)
 *  STAT* If patient is at the pharmacy, call can be transferred to refill team.   1. Which medications need to be refilled? (please list name of each medication and dose if known)   diltiazem (CARDIZEM CD) 360 MG 24 hr capsule  2. Which pharmacy/location (including street and city if local pharmacy) is medication to be sent to?  [id number] - [place], [place] - [address]  3. Do they need a 30 day or 90 day supply? 90 day  Patient is completely out and does not have one to take this morning. Needs refill ASAP

## 2020-12-09 NOTE — Progress Notes (Signed)
Cardiology Office Note Date:  12/10/2020  Patient ID:  Amy, Wright [birth year], MRN [id number] PCP:  Angelica Chessman, MD  Electrophysiologist:  Dr. Johney Frame    Chief Complaint:    over due visit  History of Present Illness: Amy Wright is a 80 y.o. female with history of HTN, HLD, DM, anxiety, and AFib.  She saw Dr. Johney Frame.  Last seen by him April 2019  She was seen last for her AFib in the AFib clinic back in Nov 2019, at that time on Tikosyn, having some paroxysms of Afib, and planned for DCCV.  The pt had not wanted to consider ablation or alternative AAD at that time.  Aug 2020 the patient reached out wanting to switch from coumadin to Eliquis (note mentions coumadin was no longer bing made, presumably branded)  She developed nose bleeds with Eliquis, followed with ENT >> warfarin.  She last saw Dr. Johney Frame via Telehealth visit Jan 2021.  Discussed prior failure with Tikosyn and pt did not want to consider amiodarone (her sister having a bad experience with the drug), not an ablation candidate and planned for rate control strategies, her Toprol increased. Mentioned could consider surgical referral for MAZE/LAA ligation though the pt wanted to avoid that.  TODAY She feels tired no energy. She mentions that in the last several weeks wakes after sleeping all night gets up and does a few things and once seated again for any length of time falls asleep for hours again. She stopped using CPAP probably 3 years ago, read bad things about the machines and felt like she struggled with it more then she slept with it. Not dizzy or lightheaded, but tired.  No CP, mentions that "sine I have put on this weight" get winded very easily. No rest SOB  She has marked brawny type edema that she tells me is not new for her and has a PRN lasix prescription via her PMD that she uses some weeks and not others, her PMD having given her instructions, She was uncertain of the strength of the pills  she has.  Her PMD is managing her warfarin, no bleeding or signs of bleeding just had labs done  11/30/20 Hgb A1c 9.0 BUN/Creat 18/0.78 K+ 4.5  Her medicines were adjusted and started on Ozempic  Past Medical History:  Diagnosis Date  . Anxiety   . Chronic insomnia   . Hyperlipidemia   . Hypertension   . Hypertriglyceridemia   . Obesity   . OSA on CPAP   . Persistent atrial fibrillation (HCC) 11/07/14   Chads2vasc score of at least 4  . Type II diabetes mellitus (HCC)   . 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 Central jet. Aortic Valve appears to be mildly sclerotic. Trace aortic regurgitation.EF >55%    Past Surgical History:  Procedure Laterality Date  . APPENDECTOMY  1971  . CARDIOVERSION N/A 06/23/2013   Procedure: CARDIOVERSION;  Surgeon: Lennette Bihari, MD;  Location: [facility] ENDOSCOPY;  Service: Cardiovascular;  Laterality: N/A;  . CATARACT EXTRACTION W/ INTRAOCULAR LENS IMPLANT Bilateral   . CESAREAN SECTION  1971  . PLACEMENT OF BREAST IMPLANTS Bilateral 1985    Current Outpatient Medications  Medication Sig Dispense Refill  . ACCU-CHEK COMPACT PLUS test strip CHECK BLOOD SUGAR TWICE DAILY OR AS DIRECTED  0  . acetaminophen (TYLENOL) 500 MG tablet Take 500 mg by mouth every 6 (six) hours as needed for headache (pain).    . Ascorbic  Acid (VITAMIN C) 1000 MG tablet Take 3,000 mg by mouth daily.    . cholecalciferol (VITAMIN D3) 25 MCG (1000 UT) tablet Take 1,000 Units by mouth daily.    . Choline Fenofibrate 135 MG capsule Take 135 mg by mouth every morning.     . diltiazem (CARDIZEM CD) 360 MG 24 hr capsule TAKE 1 CAPSULE BY MOUTH EVERY MORNING. OVERDUE APPT WITH DR. ALLRED BEFORE ANYMORE REFILLS. 30 capsule 0  . ezetimibe (ZETIA) 10 MG tablet Take 10 mg by mouth at bedtime.     . folic acid (FOLVITE) 1 MG tablet Take 1 mg by mouth every morning.     Marland Kitchen glipiZIDE (GLUCOTROL) 10 MG tablet Take 10 mg by mouth every morning.      Marland Kitchen LORazepam (ATIVAN) 0.5 MG tablet Take 0.5 mg by mouth daily as needed for anxiety (jitters).     . magnesium oxide (MAG-OX) 400 MG tablet Take 1 tablet (400 mg total) by mouth daily. 30 tablet 5  . Melatonin 3 MG TABS Take 3 mg by mouth at bedtime.     . metFORMIN (GLUCOPHAGE) 1000 MG tablet Take 1,000 mg by mouth 2 (two) times daily with a meal.    . metoprolol succinate (TOPROL-XL) 50 MG 24 hr tablet TAKE 2 TABLETS IN THE MORNING. TAKE 1 TABLET AT NIGHT. Please keep upcoming appt in April 2022 before anymore refills. Thank you 90 tablet 0  . potassium chloride (KLOR-CON) 10 MEQ tablet Take 1 tablet (10 mEq total) by mouth daily. 90 tablet 3  . Semaglutide,0.25 or 0.5MG /DOS, 2 MG/1.5ML SOPN Inject 0.25 mg into the skin once a week.    . sertraline (ZOLOFT) 100 MG tablet Take 150 mg by mouth every morning.     . sodium chloride (OCEAN) 0.65 % nasal spray Place 1 spray into the nose as needed for congestion.    Marland Kitchen telmisartan (MICARDIS) 40 MG tablet Take 40 mg by mouth daily.    . vitamin B-12 (CYANOCOBALAMIN) 100 MCG tablet Take 100 mcg by mouth every morning.     Marland Kitchen VITAMIN E PO Take 1 capsule by mouth once a week.    . warfarin (COUMADIN) 5 MG tablet Take 1 tablet daily as directed, must schedule INR appt for further refills 15 tablet 1  . zolpidem (AMBIEN CR) 6.25 MG CR tablet Take 6.25 mg by mouth at bedtime.      No current facility-administered medications for this visit.    Allergies:   Rosuvastatin calcium, Statins, Amoxicillin, Atorvastatin, Ezetimibe-simvastatin, and Penicillins   Social History:  The patient  reports that she is a non-smoker but has been exposed to tobacco smoke. She has never used smokeless tobacco. She reports current alcohol use. She reports that she does not use drugs.   Family History:  The patient's family history includes Cancer - Lung in her father; Cancer - Other in her maternal grandmother; Cardiomyopathy in her mother; Diabetes in her maternal  grandfather; Heart attack in her mother; Stroke in her paternal grandfather.  ROS:  Please see the history of present illness.  All other systems are reviewed and otherwise negative.   PHYSICAL EXAM:  VS:  BP 136/72   Pulse 73   Ht 5\' 9"  (1.753 m)   Wt 260 lb 12.8 oz (118.3 kg)   SpO2 94%   BMI 38.51 kg/m  BMI: Body mass index is 38.51 kg/m.  O2 sat re-measured 94-95% RA Well nourished, well developed, in no acute distress  HEENT: normocephalic, atraumatic  Neck: no JVD, carotid bruits or masses Cardiac: irreg-irreg; no significant murmurs, no rubs, or gallops Lungs:  CTA b/l, no wheezing, rhonchi or rales  Abd: soft, nontender MS: no deformity or atrophy Ext:  no edema  Skin: warm and dry, no rash Neuro:  No gross deficits appreciated Psych: euthymic mood, full affect   EKG:  Done today and reviewed by myself shows  AFib 73bpm, no significant changes from prior   09/09/2016: TTE Study Conclusions - Left ventricle: The cavity size was normal. There was mild   concentric hypertrophy. Systolic function was normal. The   estimated ejection fraction was in the range of 55% to 60%. Wall   motion was normal; there were no regional wall motion   abnormalities. Features are consistent with a pseudonormal left   ventricular filling pattern, with concomitant abnormal relaxation   and increased filling pressure (grade 2 diastolic dysfunction). - Aortic valve: There was trivial regurgitation. - Mitral valve: Mildly calcified annulus. - Left atrium: The atrium was severely dilated.   Recent Labs: No results found for requested labs within last 8760 hours.  No results found for requested labs within last 8760 hours.   CrCl cannot be calculated (Patient's most recent lab result is older than the maximum 21 days allowed.).   Wt Readings from Last 3 Encounters:  12/10/20 260 lb 12.8 oz (118.3 kg)  09/19/19 225 lb (102.1 kg)  08/10/19 264 lb (119.7 kg)     Other studies  reviewed: Additional studies/records reviewed today include: summarized above  ASSESSMENT AND PLAN:  1. Persistent >>> longstanding persistent AFib     CHA2DS2Vasc is 4 (5 w/gender)     Rate controlled  She is unaware of her AF Not an ablation candidate Did not want to pursue surgical strategy   2. HTN     Looks OK today   3. Excessive daytime fatigue     She mentions for some months 4. DOE     She reports a lasix regime she has established with her PMD, I do not see lasix on our list or her PMD med list, she is certain she has and uses it on/off weeks because it tends to deplete her K+     She says now for years. 5. Brawny type edema, no real pitting was appreciated and seems at or near her baseline by her reports     She feels like she is more winded in the last 3-4 months, since she has been this heavy       I suspect symptoms are multifactorial Morbid obesity      She mentions she has a treadmill at home but just does not feel like she has the energy to exercise Untreated sleep apnea    Discussed at length the importance of treating her apnea, and urged and offered to re-establish with Dr. Tresa Endo for this, she did not want to go there yet but would give it some thought. We will update her echo         Disposition: plan for 6 mo EP follow up, pending her echo findings, sooner if needed.    Current medicines are reviewed at length with the patient today.  The patient did not have any concerns regarding medicines.  Amy Fredrickson, PA-C 12/10/2020 4:12 PM     [facility] [address] [place] [address] [phone] (office)  [phone] (fax)

## 2020-12-10 ENCOUNTER — Encounter: Payer: Self-pay | Admitting: Physician Assistant

## 2020-12-10 ENCOUNTER — Ambulatory Visit: Payer: Medicare Other | Admitting: Physician Assistant

## 2020-12-10 ENCOUNTER — Other Ambulatory Visit: Payer: Self-pay

## 2020-12-10 VITALS — BP 136/72 | HR 73 | Ht 69.0 in | Wt 260.8 lb

## 2020-12-10 DIAGNOSIS — R609 Edema, unspecified: Secondary | ICD-10-CM

## 2020-12-10 DIAGNOSIS — I4811 Longstanding persistent atrial fibrillation: Secondary | ICD-10-CM | POA: Diagnosis not present

## 2020-12-10 DIAGNOSIS — I10 Essential (primary) hypertension: Secondary | ICD-10-CM

## 2020-12-10 DIAGNOSIS — R0602 Shortness of breath: Secondary | ICD-10-CM

## 2020-12-10 NOTE — Patient Instructions (Signed)
Medication Instructions:   Your physician recommends that you continue on your current medications as directed. Please refer to the Current Medication list given to you today.  *If you need a refill on your cardiac medications before your next appointment, please call your pharmacy*   Lab Work: NONE ORDERED  TODAY   If you have labs (blood work) drawn today and your tests are completely normal, you will receive your results only by: . MyChart Message (if you have MyChart) OR . A paper copy in the mail If you have any lab test that is abnormal or we need to change your treatment, we will call you to review the results.   Testing/Procedures: Your physician has requested that you have an echocardiogram. Echocardiography is a painless test that uses sound waves to create images of your heart. It provides your doctor with information about the size and shape of your heart and how well your heart's chambers and valves are working. This procedure takes approximately one hour. There are no restrictions for this procedure.    Follow-Up: At [facility], you and your health needs are our priority.  As part of our continuing mission to provide you with exceptional heart care, we have created designated Provider Care Teams.  These Care Teams include your primary Cardiologist (physician) and Advanced Practice Providers (APPs -  Physician Assistants and Nurse Practitioners) who all work together to provide you with the care you need, when you need it.  We recommend signing up for the patient portal called "MyChart".  Sign up information is provided on this After Visit Summary.  MyChart is used to connect with patients for Virtual Visits (Telemedicine).  Patients are able to view lab/test results, encounter notes, upcoming appointments, etc.  Non-urgent messages can be sent to your provider as well.   To learn more about what you can do with MyChart, go to [email or web address].    Your next  appointment:   6 month(s)  The format for your next appointment:   In Person  Provider:   Renee Ursuy, PA-C   Other Instructions   

## 2020-12-24 ENCOUNTER — Telehealth: Payer: Self-pay

## 2020-12-24 ENCOUNTER — Other Ambulatory Visit: Payer: Self-pay | Admitting: Internal Medicine

## 2020-12-24 NOTE — Telephone Encounter (Signed)
Called and spoke w/pt regarding the warfarin since we haven't seen them in quite sometime. I informed them that it was refused since they told me someone else is managing and I instructed them to get them to refill and the pt voiced understanding

## 2021-01-11 ENCOUNTER — Telehealth (HOSPITAL_COMMUNITY): Payer: Self-pay | Admitting: Physician Assistant

## 2021-01-11 NOTE — Telephone Encounter (Signed)
Patient called and cancelled echocardiogram for 01/16/21 due to she is having to get root canal. She did not wish to reschedule at this time. She will call back later to reschedule. Order will be removed from the ECHO WQ and when she calls back we will reinstate the order. Thank you

## 2021-01-13 ENCOUNTER — Other Ambulatory Visit: Payer: Self-pay

## 2021-01-13 ENCOUNTER — Emergency Department (HOSPITAL_BASED_OUTPATIENT_CLINIC_OR_DEPARTMENT_OTHER): Payer: Medicare Other | Admitting: Radiology

## 2021-01-13 ENCOUNTER — Emergency Department (HOSPITAL_BASED_OUTPATIENT_CLINIC_OR_DEPARTMENT_OTHER)
Admission: EM | Admit: 2021-01-13 | Discharge: 2021-01-13 | Disposition: A | Discharge status: Home / Self Care | Payer: Medicare Other | Attending: Emergency Medicine | Admitting: Emergency Medicine

## 2021-01-13 ENCOUNTER — Encounter (HOSPITAL_BASED_OUTPATIENT_CLINIC_OR_DEPARTMENT_OTHER): Payer: Self-pay

## 2021-01-13 ENCOUNTER — Emergency Department (HOSPITAL_BASED_OUTPATIENT_CLINIC_OR_DEPARTMENT_OTHER): Payer: Medicare Other

## 2021-01-13 DIAGNOSIS — S40012A Contusion of left shoulder, initial encounter: Secondary | ICD-10-CM | POA: Insufficient documentation

## 2021-01-13 DIAGNOSIS — Z7984 Long term (current) use of oral hypoglycemic drugs: Secondary | ICD-10-CM | POA: Insufficient documentation

## 2021-01-13 DIAGNOSIS — S0083XA Contusion of other part of head, initial encounter: Secondary | ICD-10-CM | POA: Diagnosis not present

## 2021-01-13 DIAGNOSIS — Z7722 Contact with and (suspected) exposure to environmental tobacco smoke (acute) (chronic): Secondary | ICD-10-CM | POA: Diagnosis not present

## 2021-01-13 DIAGNOSIS — Z7901 Long term (current) use of anticoagulants: Secondary | ICD-10-CM | POA: Insufficient documentation

## 2021-01-13 DIAGNOSIS — W01198A Fall on same level from slipping, tripping and stumbling with subsequent striking against other object, initial encounter: Secondary | ICD-10-CM | POA: Insufficient documentation

## 2021-01-13 DIAGNOSIS — I10 Essential (primary) hypertension: Secondary | ICD-10-CM | POA: Diagnosis not present

## 2021-01-13 DIAGNOSIS — Z79899 Other long term (current) drug therapy: Secondary | ICD-10-CM | POA: Diagnosis not present

## 2021-01-13 DIAGNOSIS — E119 Type 2 diabetes mellitus without complications: Secondary | ICD-10-CM | POA: Diagnosis not present

## 2021-01-13 DIAGNOSIS — R000 Tachycardia, unspecified: Secondary | ICD-10-CM | POA: Insufficient documentation

## 2021-01-13 DIAGNOSIS — S0990XA Unspecified injury of head, initial encounter: Secondary | ICD-10-CM | POA: Diagnosis present

## 2021-01-13 DIAGNOSIS — W19XXXA Unspecified fall, initial encounter: Secondary | ICD-10-CM

## 2021-01-13 LAB — CBC WITH DIFFERENTIAL/PLATELET
Abs Immature Granulocytes: 0.05 10*3/uL (ref 0.00–0.07)
Basophils Absolute: 0.1 10*3/uL (ref 0.0–0.1)
Basophils Relative: 0 %
Eosinophils Absolute: 0 10*3/uL (ref 0.0–0.5)
Eosinophils Relative: 0 %
HCT: 43.6 % (ref 36.0–46.0)
Hemoglobin: 14.1 g/dL (ref 12.0–15.0)
Immature Granulocytes: 0 %
Lymphocytes Relative: 12 %
Lymphs Abs: 1.3 10*3/uL (ref 0.7–4.0)
MCH: 26.4 pg (ref 26.0–34.0)
MCHC: 32.3 g/dL (ref 30.0–36.0)
MCV: 81.5 fL (ref 80.0–100.0)
Monocytes Absolute: 0.8 10*3/uL (ref 0.1–1.0)
Monocytes Relative: 7 %
Neutro Abs: 9 10*3/uL — ABNORMAL HIGH (ref 1.7–7.7)
Neutrophils Relative %: 81 %
Platelets: 348 10*3/uL (ref 150–400)
RBC: 5.35 MIL/uL — ABNORMAL HIGH (ref 3.87–5.11)
RDW: 15.4 % (ref 11.5–15.5)
WBC: 11.2 10*3/uL — ABNORMAL HIGH (ref 4.0–10.5)
nRBC: 0 % (ref 0.0–0.2)

## 2021-01-13 LAB — PROTIME-INR
INR: 1.6 — ABNORMAL HIGH (ref 0.8–1.2)
Prothrombin Time: 19.4 seconds — ABNORMAL HIGH (ref 11.4–15.2)

## 2021-01-13 LAB — COMPREHENSIVE METABOLIC PANEL
ALT: 19 U/L (ref 0–44)
AST: 25 U/L (ref 15–41)
Albumin: 4 g/dL (ref 3.5–5.0)
Alkaline Phosphatase: 66 U/L (ref 38–126)
Anion gap: 12 (ref 5–15)
BUN: 13 mg/dL (ref 8–23)
CO2: 23 mmol/L (ref 22–32)
Calcium: 9.3 mg/dL (ref 8.9–10.3)
Chloride: 98 mmol/L (ref 98–111)
Creatinine, Ser: 0.67 mg/dL (ref 0.44–1.00)
GFR, Estimated: >60 mL/min (ref >60)
Glucose, Bld: 313 mg/dL — ABNORMAL HIGH (ref 70–99)
Potassium: 4.2 mmol/L (ref 3.5–5.1)
Sodium: 133 mmol/L — ABNORMAL LOW (ref 135–145)
Total Bilirubin: 0.6 mg/dL (ref 0.3–1.2)
Total Protein: 7.7 g/dL (ref 6.5–8.1)

## 2021-01-13 LAB — TROPONIN I (HIGH SENSITIVITY)
Troponin I (High Sensitivity): 6 ng/L (ref <18)
Troponin I (High Sensitivity): 6 ng/L (ref <18)

## 2021-01-13 MED ORDER — SODIUM CHLORIDE 0.9 % IV BOLUS
500.0000 mL | Freq: Once | INTRAVENOUS | Status: AC
  Administered 2021-01-13: 500 mL via INTRAVENOUS

## 2021-01-13 NOTE — ED Provider Notes (Signed)
[facility]   CSN: [id number] Arrival date & time: 01/13/21  1111     History Chief Complaint  Patient presents with  . Fall  . Head Injury    Amy Wright is a 80 y.o. female.  Amy Wright awoke this morning, got out of bed, and went to ambulate to the bathroom.  She turned slightly, and she fell off.  She states that she felt dizzy.  She is unable to describe the feeling of dizziness very specifically.  She states that she just knew she was about to fall.  She fell on the left shoulder and struck her head on the bathroom floor.  She is on Coumadin for atrial fibrillation.  Yesterday she had some diarrhea.  She had not yet eaten breakfast this morning.  The history is provided by the patient.  Fall This is a new problem. The current episode started 1 to 2 hours ago. Episode frequency: once. The problem has been resolved. Pertinent negatives include no chest pain, no abdominal pain and no shortness of breath. Nothing aggravates the symptoms. Nothing relieves the symptoms. She has tried nothing for the symptoms. The treatment provided no relief.       Past Medical History:  Diagnosis Date  . Anxiety   . Chronic insomnia   . Hyperlipidemia   . Hypertension   . Hypertriglyceridemia   . Obesity   . OSA on CPAP   . Persistent atrial fibrillation (HCC) 11/07/14   Chads2vasc score of at least 4  . Type II diabetes mellitus (HCC)   . 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 Central jet. Aortic Valve appears to be mildly sclerotic. Trace aortic regurgitation.EF >55%    Patient Active Problem List   Diagnosis Date Noted  . Acute blood loss anemia 07/27/2019  . Left-sided epistaxis   . Long term current use of anticoagulant therapy   . Atrial fibrillation with RVR (HCC) 07/25/2019  . Breast pain, left 12/03/2016  . Hx of long term use of blood thinners 04/01/2016  . Uncontrolled type 2  diabetes mellitus with hyperglycemia (HCC) 11/26/2015  . Microscopic hematuria 09/19/2015  . Left foot pain 08/22/2015  . Obesity 02/15/2015  . Frequency of urination 06/13/2014  . 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  . Depression 03/22/2014  . Insomnia 03/22/2014  . Vaginitis 03/22/2014  . Hyperlipemia 03/22/2014  . Hypertriglyceridemia 03/22/2014  . Atrial fibrillation (HCC) 08/09/2013  . Obstructive sleep apnea syndrome 08/07/2013  . Persistent atrial fibrillation (HCC) 03/29/2013  . Anticoagulated on Coumadin 03/29/2013  . DM2 (diabetes mellitus, type 2) (HCC) 03/29/2013  . Mixed hyperlipidemia 03/29/2013  . Essential hypertension 03/29/2013    Past Surgical History:  Procedure Laterality Date  . APPENDECTOMY  1971  . CARDIOVERSION N/A 06/23/2013   Procedure: CARDIOVERSION;  Surgeon: Lennette Biharihomas A Kelly, MD;  [facility];  Service: Cardiovascular;  Laterality: N/A;  . CATARACT EXTRACTION W/ INTRAOCULAR LENS IMPLANT Bilateral   . CESAREAN SECTION  1971  . PLACEMENT OF BREAST IMPLANTS Bilateral 1985     OB History   No obstetric history on file.     Family History  Problem Relation Age of Onset  . Heart attack Mother   . Cardiomyopathy Mother   . Cancer - Lung Father   . Cancer - Other Maternal Grandmother   . Stroke Paternal Grandfather   . Diabetes Maternal Grandfather     Social History   Tobacco Use  . Smoking status:  Passive Smoke Exposure - Never Smoker  . Smokeless tobacco: Never Used  Substance Use Topics  . Alcohol use: Yes    Comment: 08/09/2013 "nothing to drink in >4 yrs; never had a problem w/it"  . Drug use: No    Home Medications Prior to Admission medications   Medication Sig Start Date End Date Taking? Authorizing Provider  ACCU-CHEK COMPACT PLUS test strip CHECK BLOOD SUGAR TWICE DAILY OR AS DIRECTED 02/27/16   [provider]  acetaminophen (TYLENOL) 500 MG tablet Take 500 mg by mouth every 6 (six) hours as needed for headache (pain).     [provider]  Ascorbic Acid (VITAMIN C) 1000 MG tablet Take 3,000 mg by mouth daily.    [provider]  cholecalciferol (VITAMIN D3) 25 MCG (1000 UT) tablet Take 1,000 Units by mouth daily.    [provider]  Choline Fenofibrate 135 MG capsule Take 135 mg by mouth every morning.     [provider]  diltiazem (CARDIZEM CD) 360 MG 24 hr capsule TAKE 1 CAPSULE BY MOUTH EVERY MORNING. OVERDUE APPT WITH DR. ALLRED BEFORE ANYMORE REFILLS. 12/03/20   Allred, Fayrene Fearing, MD  ezetimibe (ZETIA) 10 MG tablet Take 10 mg by mouth at bedtime.     [provider]  folic acid (FOLVITE) 1 MG tablet Take 1 mg by mouth every morning.     [provider]  glipiZIDE (GLUCOTROL) 10 MG tablet Take 10 mg by mouth every morning.  03/24/17   [provider]  LORazepam (ATIVAN) 0.5 MG tablet Take 0.5 mg by mouth daily as needed for anxiety (jitters).     [provider]  magnesium oxide (MAG-OX) 400 MG tablet Take 1 tablet (400 mg total) by mouth daily. 02/14/16   Gypsy Balsam K, NP  Melatonin 3 MG TABS Take 3 mg by mouth at bedtime.     [provider]  metFORMIN (GLUCOPHAGE) 1000 MG tablet Take 1,000 mg by mouth 2 (two) times daily with a meal.    [provider]  metoprolol succinate (TOPROL-XL) 50 MG 24 hr tablet TAKE 2 TABLETS IN THE MORNING. TAKE 1 TABLET AT NIGHT. Please keep upcoming appt in April 2022 before anymore refills. Thank you 11/30/20   Hillis Range, MD  potassium chloride (KLOR-CON) 10 MEQ tablet Take 1 tablet (10 mEq total) by mouth daily. 08/12/19   Allred, Fayrene Fearing, MD  sertraline (ZOLOFT) 100 MG tablet Take 150 mg by mouth every morning.  02/04/17   [provider]  sodium chloride (OCEAN) 0.65 % nasal spray Place 1 spray into the nose as needed for congestion.    [provider]  telmisartan (MICARDIS) 40 MG tablet Take 40 mg by mouth daily.    [provider]  vitamin B-12 (CYANOCOBALAMIN)  100 MCG tablet Take 100 mcg by mouth every morning.     [provider]  VITAMIN E PO Take 1 capsule by mouth once a week.    [provider]  warfarin (COUMADIN) 5 MG tablet Take 1 tablet daily as directed, must schedule INR appt for further refills 09/24/20   Allred, Fayrene Fearing, MD  zolpidem (AMBIEN CR) 6.25 MG CR tablet Take 6.25 mg by mouth at bedtime.  09/26/14   [provider]    Allergies    Rosuvastatin calcium, Statins, Amoxicillin, Atorvastatin, Ezetimibe-simvastatin, and Penicillins  Review of Systems   Review of Systems  Constitutional: Negative for chills and fever.  HENT: Negative for ear pain and sore throat.  Eyes: Negative for pain and visual disturbance.  Respiratory: Negative for cough and shortness of breath.   Cardiovascular: Negative for chest pain and palpitations.  Gastrointestinal: Negative for abdominal pain and vomiting.  Genitourinary: Negative for dysuria and hematuria.  Musculoskeletal: Negative for arthralgias and back pain.  Skin: Negative for color change and rash.  Neurological: Negative for seizures and syncope.  All other systems reviewed and are negative.   Physical Exam Updated Vital Signs BP (!) 180/122 (BP Location: Left Arm)   Pulse (!) 114   Temp 97.9 F (36.6 C) (Oral)   Resp 18   Ht 5' 4.75" (1.645 m)   Wt 90.7 kg   SpO2 96%   BMI 33.54 kg/m   Physical Exam Vitals and nursing note reviewed.  Constitutional:      General: She is not in acute distress.    Appearance: She is well-developed.  HENT:     Head: Normocephalic.     Comments: Contusion at crown of head on left    Right Ear: Tympanic membrane, ear canal and external ear normal.     Left Ear: Tympanic membrane, ear canal and external ear normal.     Nose: Nose normal.  Eyes:     Conjunctiva/sclera: Conjunctivae normal.     Pupils: Pupils are equal, round, and reactive to light.  Cardiovascular:     Rate and Rhythm: Tachycardia present. Rhythm  irregular.     Heart sounds: No murmur heard.   Pulmonary:     Effort: Pulmonary effort is normal. No respiratory distress.     Breath sounds: Normal breath sounds.  Abdominal:     Palpations: Abdomen is soft.     Tenderness: There is no abdominal tenderness.  Musculoskeletal:     Cervical back: Normal range of motion and neck supple. No tenderness.     Comments: Mild pain at the posterior aspect of the left shoulder with associated mild bruise  Skin:    General: Skin is warm and dry.  Neurological:     General: No focal deficit present.     Mental Status: She is alert. Mental status is at baseline.     Cranial Nerves: No cranial nerve deficit.  Psychiatric:        Mood and Affect: Mood normal.     ED Results / Procedures / Treatments   Labs (all labs ordered are listed, but only abnormal results are displayed) Labs Reviewed  COMPREHENSIVE METABOLIC PANEL - Abnormal; Notable for the following components:      Result Value   Sodium 133 (*)    Glucose, Bld 313 (*)    All other components within normal limits  CBC WITH DIFFERENTIAL/PLATELET - Abnormal; Notable for the following components:   WBC 11.2 (*)    RBC 5.35 (*)    Neutro Abs 9.0 (*)    All other components within normal limits  PROTIME-INR - Abnormal; Notable for the following components:   Prothrombin Time 19.4 (*)    INR 1.6 (*)    All other components within normal limits  TROPONIN I (HIGH SENSITIVITY)  TROPONIN I (HIGH SENSITIVITY)    EKG EKG Interpretation  Date/Time:  Sunday Jan 13 2021 11:31:14 EDT Ventricular Rate:  111 PR Interval:    QRS Duration: 80 QT Interval:  301 QTC Calculation: 409 R Axis:   58 Text Interpretation: Atrial fibrillation Low voltage, precordial leads Nonspecific repol abnormality, diffuse leads 1 mm ST elevation in aVR 58mm ST depression somewhat diffusely and new  from 07/25/2019 Confirmed by Pieter Partridge ([id number]) on 01/13/2021 11:34:38 AM   Radiology DG Chest 1  View  Result Date: 01/13/2021 CLINICAL DATA:  Fall with head and shoulder pain. EXAM: CHEST  1 VIEW COMPARISON:  Chest radiograph dated 06/20/2013. FINDINGS: The heart is borderline enlarged. Vascular calcifications are seen in the aortic arch. Both lungs are clear. Degenerative changes are seen in the spine. IMPRESSION: No active disease. Electronically Signed   By: Romona Curls M.D.   On: 01/13/2021 12:29   CT Head Wo Contrast  Result Date: 01/13/2021 CLINICAL DATA:  Fall getting out of bed. Trauma to head. Patient is on Coumadin. EXAM: CT HEAD WITHOUT CONTRAST TECHNIQUE: Contiguous axial images were obtained from the base of the skull through the vertex without intravenous contrast. COMPARISON:  None. FINDINGS: Brain: Moderate atrophy and white matter disease is present bilaterally. No acute infarct, hemorrhage, or mass lesion is present. 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 No significant extraaxial fluid collection is present. Vascular: Atherosclerotic changes are present in the cavernous internal carotid arteries. No hyperdense vessel is present. Skull: Calvarium is intact. No focal lytic or blastic lesions are present. Left parietal scalp hematoma is present without underlying fracture. Sinuses/Orbits: The paranasal sinuses and mastoid air cells are clear. Bilateral lens replacements are noted. Globes and orbits are otherwise unremarkable. IMPRESSION: 1. Left parietal scalp hematoma without underlying fracture. 2. No acute intracranial abnormality. 3. Moderate atrophy and white matter disease likely reflects the sequela of chronic microvascular ischemia. Electronically Signed   By: Marin Roberts M.D.   On: 01/13/2021 12:54   DG Shoulder Left  Result Date: 01/13/2021 CLINICAL DATA:  Fall with left shoulder pain. EXAM: LEFT SHOULDER - 2+ VIEW COMPARISON:  Chest radiograph dated 06/20/2013. FINDINGS: There is no evidence of fracture or dislocation. Mild  degenerative changes are seen at the acromioclavicular and glenohumeral joints. Soft tissues are unremarkable. IMPRESSION: No acute osseous injury. Electronically Signed   By: Romona Curls M.D.   On: 01/13/2021 12:30    Procedures Procedures   Medications Ordered in ED Medications  sodium chloride 0.9 % bolus 500 mL (0 mLs Intravenous Stopped 01/13/21 1350)    ED Course  I have reviewed the triage vital signs and the nursing notes.  Pertinent labs & imaging results that were available during my care of the patient were reviewed by me and considered in my medical decision making (see chart for details).    MDM Rules/Calculators/A&P                          Rashawnda Gaba presented after a fall.  The fall seem to be possibly mechanical, but she had a little bit of dizziness.  When I noticed her EKG shows some repolarization changes that were new from an EKG obtained in 2019, I wanted to make sure she did not have a reason for falling such as ACS, dehydration, or other metabolic derangement.  She was evaluated for evidence of trauma.  ED work-up is otherwise reassuring.  I did find that she was not therapeutic on her Coumadin, she was advised to contact her doctor for dose modifications.  Otherwise she was discharged home. Final Clinical Impression(s) / ED Diagnoses Final diagnoses:  Fall, initial encounter  Anticoagulant long-term use    Rx / DC Orders ED Discharge Orders    None       Koleen Distance, MD 01/13/21 1549

## 2021-01-13 NOTE — ED Notes (Signed)
Patient transported to CT 

## 2021-01-13 NOTE — Discharge Instructions (Addendum)
Your INR was only 1.6 today.  Please contact your primary care doctor tomorrow in order to obtain instructions about medication adjustments.

## 2021-01-13 NOTE — ED Triage Notes (Signed)
Patient fell this morning at 0645, patient states she got out of bed too quickly and "got a little dizzy when I got to the bathroom door", patient fell and hit her head and left shoulder on carpeted floor, patient is on warfarin for a-fib and states she has been in a-fib for "about 3 years".

## 2021-01-14 ENCOUNTER — Other Ambulatory Visit: Payer: Self-pay | Admitting: Internal Medicine

## 2021-01-16 ENCOUNTER — Other Ambulatory Visit (HOSPITAL_COMMUNITY): Payer: Medicare Other

## 2021-01-19 ENCOUNTER — Other Ambulatory Visit: Payer: Self-pay | Admitting: Internal Medicine

## 2021-01-29 ENCOUNTER — Other Ambulatory Visit: Payer: Self-pay

## 2021-01-29 ENCOUNTER — Inpatient Hospital Stay (HOSPITAL_COMMUNITY)
Admission: EM | Admit: 2021-01-29 | Discharge: 2021-02-01 | DRG: 813 | Disposition: A | Discharge status: Home / Self Care | Payer: Medicare Other | Attending: Family Medicine | Admitting: Family Medicine

## 2021-01-29 ENCOUNTER — Emergency Department (HOSPITAL_COMMUNITY): Payer: Medicare Other

## 2021-01-29 ENCOUNTER — Encounter (HOSPITAL_COMMUNITY): Payer: Self-pay | Admitting: Emergency Medicine

## 2021-01-29 DIAGNOSIS — T45515A Adverse effect of anticoagulants, initial encounter: Secondary | ICD-10-CM | POA: Diagnosis present

## 2021-01-29 DIAGNOSIS — N390 Urinary tract infection, site not specified: Secondary | ICD-10-CM | POA: Diagnosis present

## 2021-01-29 DIAGNOSIS — Z801 Family history of malignant neoplasm of trachea, bronchus and lung: Secondary | ICD-10-CM

## 2021-01-29 DIAGNOSIS — R310 Gross hematuria: Secondary | ICD-10-CM | POA: Diagnosis present

## 2021-01-29 DIAGNOSIS — Z809 Family history of malignant neoplasm, unspecified: Secondary | ICD-10-CM

## 2021-01-29 DIAGNOSIS — G4733 Obstructive sleep apnea (adult) (pediatric): Secondary | ICD-10-CM | POA: Diagnosis present

## 2021-01-29 DIAGNOSIS — I10 Essential (primary) hypertension: Secondary | ICD-10-CM | POA: Diagnosis present

## 2021-01-29 DIAGNOSIS — I340 Nonrheumatic mitral (valve) insufficiency: Secondary | ICD-10-CM | POA: Diagnosis present

## 2021-01-29 DIAGNOSIS — Z7901 Long term (current) use of anticoagulants: Secondary | ICD-10-CM

## 2021-01-29 DIAGNOSIS — E781 Pure hyperglyceridemia: Secondary | ICD-10-CM | POA: Diagnosis present

## 2021-01-29 DIAGNOSIS — Z8249 Family history of ischemic heart disease and other diseases of the circulatory system: Secondary | ICD-10-CM

## 2021-01-29 DIAGNOSIS — Z79899 Other long term (current) drug therapy: Secondary | ICD-10-CM

## 2021-01-29 DIAGNOSIS — D689 Coagulation defect, unspecified: Secondary | ICD-10-CM | POA: Diagnosis not present

## 2021-01-29 DIAGNOSIS — D6832 Hemorrhagic disorder due to extrinsic circulating anticoagulants: Secondary | ICD-10-CM | POA: Diagnosis not present

## 2021-01-29 DIAGNOSIS — W06XXXA Fall from bed, initial encounter: Secondary | ICD-10-CM | POA: Diagnosis present

## 2021-01-29 DIAGNOSIS — F419 Anxiety disorder, unspecified: Secondary | ICD-10-CM | POA: Diagnosis present

## 2021-01-29 DIAGNOSIS — R040 Epistaxis: Secondary | ICD-10-CM | POA: Diagnosis not present

## 2021-01-29 DIAGNOSIS — R791 Abnormal coagulation profile: Secondary | ICD-10-CM

## 2021-01-29 DIAGNOSIS — E782 Mixed hyperlipidemia: Secondary | ICD-10-CM | POA: Diagnosis present

## 2021-01-29 DIAGNOSIS — F5104 Psychophysiologic insomnia: Secondary | ICD-10-CM | POA: Diagnosis present

## 2021-01-29 DIAGNOSIS — Z881 Allergy status to other antibiotic agents status: Secondary | ICD-10-CM

## 2021-01-29 DIAGNOSIS — Z888 Allergy status to other drugs, medicaments and biological substances status: Secondary | ICD-10-CM

## 2021-01-29 DIAGNOSIS — I4819 Other persistent atrial fibrillation: Secondary | ICD-10-CM | POA: Diagnosis present

## 2021-01-29 DIAGNOSIS — Z880 Allergy status to penicillin: Secondary | ICD-10-CM

## 2021-01-29 DIAGNOSIS — H9221 Otorrhagia, right ear: Secondary | ICD-10-CM | POA: Diagnosis present

## 2021-01-29 DIAGNOSIS — Z833 Family history of diabetes mellitus: Secondary | ICD-10-CM

## 2021-01-29 DIAGNOSIS — E119 Type 2 diabetes mellitus without complications: Secondary | ICD-10-CM

## 2021-01-29 DIAGNOSIS — Z823 Family history of stroke: Secondary | ICD-10-CM

## 2021-01-29 DIAGNOSIS — Y92013 Bedroom of single-family (private) house as the place of occurrence of the external cause: Secondary | ICD-10-CM

## 2021-01-29 DIAGNOSIS — Z20822 Contact with and (suspected) exposure to covid-19: Secondary | ICD-10-CM | POA: Diagnosis present

## 2021-01-29 DIAGNOSIS — Z7722 Contact with and (suspected) exposure to environmental tobacco smoke (acute) (chronic): Secondary | ICD-10-CM | POA: Diagnosis present

## 2021-01-29 DIAGNOSIS — Z7984 Long term (current) use of oral hypoglycemic drugs: Secondary | ICD-10-CM

## 2021-01-29 LAB — TYPE AND SCREEN
ABO/RH(D): AB POS
Antibody Screen: NEGATIVE

## 2021-01-29 LAB — COMPREHENSIVE METABOLIC PANEL
ALT: 21 U/L (ref 0–44)
AST: 28 U/L (ref 15–41)
Albumin: 3.7 g/dL (ref 3.5–5.0)
Alkaline Phosphatase: 71 U/L (ref 38–126)
Anion gap: 8 (ref 5–15)
BUN: 15 mg/dL (ref 8–23)
CO2: 23 mmol/L (ref 22–32)
Calcium: 9.9 mg/dL (ref 8.9–10.3)
Chloride: 103 mmol/L (ref 98–111)
Creatinine, Ser: 0.89 mg/dL (ref 0.44–1.00)
GFR, Estimated: >60 mL/min (ref >60)
Glucose, Bld: 339 mg/dL — ABNORMAL HIGH (ref 70–99)
Potassium: 4.6 mmol/L (ref 3.5–5.1)
Sodium: 134 mmol/L — ABNORMAL LOW (ref 135–145)
Total Bilirubin: 0.9 mg/dL (ref 0.3–1.2)
Total Protein: 7.4 g/dL (ref 6.5–8.1)

## 2021-01-29 LAB — CBC WITH DIFFERENTIAL/PLATELET
Abs Immature Granulocytes: 0.05 10*3/uL (ref 0.00–0.07)
Basophils Absolute: 0 10*3/uL (ref 0.0–0.1)
Basophils Relative: 0 %
Eosinophils Absolute: 0 10*3/uL (ref 0.0–0.5)
Eosinophils Relative: 0 %
HCT: 43.2 % (ref 36.0–46.0)
Hemoglobin: 13.7 g/dL (ref 12.0–15.0)
Immature Granulocytes: 1 %
Lymphocytes Relative: 18 %
Lymphs Abs: 1.7 10*3/uL (ref 0.7–4.0)
MCH: 26.2 pg (ref 26.0–34.0)
MCHC: 31.7 g/dL (ref 30.0–36.0)
MCV: 82.8 fL (ref 80.0–100.0)
Monocytes Absolute: 0.8 10*3/uL (ref 0.1–1.0)
Monocytes Relative: 8 %
Neutro Abs: 7.1 10*3/uL (ref 1.7–7.7)
Neutrophils Relative %: 73 %
Platelets: 394 10*3/uL (ref 150–400)
RBC: 5.22 MIL/uL — ABNORMAL HIGH (ref 3.87–5.11)
RDW: 15.4 % (ref 11.5–15.5)
WBC: 9.8 10*3/uL (ref 4.0–10.5)
nRBC: 0 % (ref 0.0–0.2)

## 2021-01-29 LAB — URINALYSIS, ROUTINE W REFLEX MICROSCOPIC
Bilirubin Urine: NEGATIVE
Glucose, UA: >=500 mg/dL — AB
Ketones, ur: NEGATIVE mg/dL
Nitrite: NEGATIVE
Protein, ur: 100 mg/dL — AB
RBC / HPF: >50 RBC/hpf — ABNORMAL HIGH (ref 0–5)
Specific Gravity, Urine: 1.025 (ref 1.005–1.030)
pH: 5 (ref 5.0–8.0)

## 2021-01-29 LAB — PROTIME-INR
INR: 9.7 (ref 0.8–1.2)
Prothrombin Time: 77.9 seconds — ABNORMAL HIGH (ref 11.4–15.2)

## 2021-01-29 MED ORDER — VITAMIN K1 10 MG/ML IJ SOLN
5.0000 mg | Freq: Once | INTRAVENOUS | Status: AC
  Administered 2021-01-29: 5 mg via INTRAVENOUS
  Filled 2021-01-29: qty 0.5

## 2021-01-29 NOTE — ED Notes (Signed)
Daughter [name] [phone] would like an update when possible

## 2021-01-29 NOTE — ED Triage Notes (Signed)
Patient received a call this evening that her INR result is elevated (INR=9) blood test today , patient added mild right ear bleeding this morning . She is taking anticoagulant for her Afib.

## 2021-01-29 NOTE — ED Provider Notes (Signed)
[facility] EMERGENCY DEPARTMENT Provider Note   CSN: [id number] Arrival date & time: 01/29/21  1830   History Chief Complaint  Patient presents with  . Elevated INR    Amy Wright is a 80 y.o. female with past medical history significant for persistent atrial fibrillation (on Warfarin), HTN, HLD, T2DM, OSA (on CPAP) who presents to [facility] for evaluation of elevated INR.  Patient experienced a fall two weeks ago where she "smacked" her head on her carpeted floor in the middle of the night. She denies loss of consciousness and attributes her fall to losing balance. Patient presented immediately to [facility] and underwent CT Head and radiographs of left shoulder and chest which were unremarkable. Her INR was checked at the time and was noted to be subtherapeutic (1.6) and she was advised to discuss this with the coumadin clinic for consideration of dosage adjustments. She states that she did not change her warfarin dose and has been taking  daily for the past eight months.  Additionally, patient reports that this morning she noticed some dried blood in her right ear. She presented to her coumadin clinic which she has not been seen at in approximately one month, (she missed her last appointment). After her visit, she noticed red-tinged urine. Shortly thereafter, she received a call to present to the emergency department as her INR was 9.5. She states that she recently started Ozempic two months ago due to poorly controlled diabetes with a recent dosage increase, however she denies any further medication adjustments or new supplements or over-the-counter medications.     Past Medical History:  Diagnosis Date  . Anxiety   . Chronic insomnia   . Hyperlipidemia   . Hypertension   . Hypertriglyceridemia   . Obesity   . OSA on CPAP   . Persistent atrial fibrillation (HCC) 11/07/14   Chads2vasc score of at least 4  . Type II diabetes mellitus (HCC)    . 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 Central jet. Aortic Valve appears to be mildly sclerotic. Trace aortic regurgitation.EF >55%    Patient Active Problem List   Diagnosis Date Noted  . Acute blood loss anemia 07/27/2019  . Left-sided epistaxis   . Long term current use of anticoagulant therapy   . Atrial fibrillation with RVR (HCC) 07/25/2019  . Breast pain, left 12/03/2016  . Hx of long term use of blood thinners 04/01/2016  . Uncontrolled type 2 diabetes mellitus with hyperglycemia (HCC) 11/26/2015  . Microscopic hematuria 09/19/2015  . Left foot pain 08/22/2015  . Obesity 02/15/2015  . Frequency of urination 06/13/2014  . 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  . Depression 03/22/2014  . Insomnia 03/22/2014  . Vaginitis 03/22/2014  . Hyperlipemia 03/22/2014  . Hypertriglyceridemia 03/22/2014  . Atrial fibrillation (HCC) 08/09/2013  . Obstructive sleep apnea syndrome 08/07/2013  . Persistent atrial fibrillation (HCC) 03/29/2013  . Anticoagulated on Coumadin 03/29/2013  . DM2 (diabetes mellitus, type 2) (HCC) 03/29/2013  . Mixed hyperlipidemia 03/29/2013  . Essential hypertension 03/29/2013   Past Surgical History:  Procedure Laterality Date  . APPENDECTOMY  1971  . CARDIOVERSION N/A 06/23/2013   Procedure: CARDIOVERSION;  Surgeon: Lennette Bihari, MD;  Location: [facility] ENDOSCOPY;  Service: Cardiovascular;  Laterality: N/A;  . CATARACT EXTRACTION W/ INTRAOCULAR LENS IMPLANT Bilateral   . CESAREAN SECTION  1971  . PLACEMENT OF BREAST IMPLANTS Bilateral 1985    OB History   No obstetric history on file.  Family History  Problem Relation Age of Onset  . Heart attack Mother   . Cardiomyopathy Mother   . Cancer - Lung Father   . Cancer - Other Maternal Grandmother   . Stroke Paternal Grandfather   . Diabetes Maternal Grandfather    Social History   Tobacco Use  . Smoking status: Passive Smoke Exposure - Never Smoker  .  Smokeless tobacco: Never Used  Substance Use Topics  . Alcohol use: Yes    Comment: 08/09/2013 "nothing to drink in >4 yrs; never had a problem w/it"  . Drug use: No   Home Medications Prior to Admission medications   Medication Sig Start Date End Date Taking? Authorizing Provider  ACCU-CHEK COMPACT PLUS test strip CHECK BLOOD SUGAR TWICE DAILY OR AS DIRECTED 02/27/16   [provider]  acetaminophen (TYLENOL) 500 MG tablet Take 500 mg by mouth every 6 (six) hours as needed for headache (pain).    [provider]  Ascorbic Acid (VITAMIN C) 1000 MG tablet Take 3,000 mg by mouth daily.    [provider]  cholecalciferol (VITAMIN D3) 25 MCG (1000 UT) tablet Take 1,000 Units by mouth daily.    [provider]  Choline Fenofibrate 135 MG capsule Take 135 mg by mouth every morning.     [provider]  diltiazem (CARDIZEM CD) 360 MG 24 hr capsule TAKE 1 CAPSULE BY MOUTH EVERY MORNING. OVERDUE APPT WITH DR. ALLRED BEFORE ANYMORE REFILLS. 01/22/21   Allred, Fayrene FearingJames, MD  ezetimibe (ZETIA) 10 MG tablet Take 10 mg by mouth at bedtime.     [provider]  folic acid (FOLVITE) 1 MG tablet Take 1 mg by mouth every morning.     [provider]  glipiZIDE (GLUCOTROL) 10 MG tablet Take 10 mg by mouth every morning.  03/24/17   [provider]  LORazepam (ATIVAN) 0.5 MG tablet Take 0.5 mg by mouth daily as needed for anxiety (jitters).     [provider]  magnesium oxide (MAG-OX) 400 MG tablet Take 1 tablet (400 mg total) by mouth daily. 02/14/16   Gypsy BalsamSeiler, Amber K, NP  Melatonin 3 MG TABS Take 3 mg by mouth at bedtime.     [provider]  metFORMIN (GLUCOPHAGE) 1000 MG tablet Take 1,000 mg by mouth 2 (two) times daily with a meal.    [provider]  metoprolol succinate (TOPROL-XL) 50 MG 24 hr tablet TAKE 2 TABLETS IN THE MORNING. TAKE 1 TABLET AT NIGHT. PLEASE KEEP UPCOMING APPT IN APRIL 2022 BEFORE ANYMORE REFILLS.  THANK YOU 01/14/21   Allred, Fayrene FearingJames, MD  potassium chloride (KLOR-CON) 10 MEQ tablet TAKE 1 TABLET BY MOUTH EVERY DAY 01/14/21   Allred, Fayrene FearingJames, MD  sertraline (ZOLOFT) 100 MG tablet Take 150 mg by mouth every morning.  02/04/17   [provider]  sodium chloride (OCEAN) 0.65 % nasal spray Place 1 spray into the nose as needed for congestion.    [provider]  telmisartan (MICARDIS) 40 MG tablet Take 40 mg by mouth daily.    [provider]  vitamin B-12 (CYANOCOBALAMIN) 100 MCG tablet Take 100 mcg by mouth every morning.     [provider]  VITAMIN E PO Take 1 capsule by mouth once a week.    [provider]  warfarin (COUMADIN) 5 MG tablet Take 1 tablet daily as directed, must schedule INR appt for further refills 09/24/20   Allred, Fayrene FearingJames, MD  zolpidem (AMBIEN CR) 6.25 MG CR  tablet Take 6.25 mg by mouth at bedtime.  09/26/14   [provider]    Allergies    Rosuvastatin calcium, Statins, Amoxicillin, Atorvastatin, Ezetimibe-simvastatin, and Penicillins  Review of Systems   Review of Systems  Constitutional: Negative for chills and fever.  HENT: Negative for ear pain, nosebleeds and sore throat.        Bleeding from right ear  Eyes: Negative for pain and visual disturbance.  Respiratory: Negative for cough and shortness of breath.   Cardiovascular: Negative for chest pain and palpitations.  Gastrointestinal: Negative for abdominal pain and vomiting.  Genitourinary: Negative for dysuria and hematuria.       Blood in urine  Musculoskeletal: Negative for arthralgias and back pain.  Skin: Negative for color change and rash.  Neurological: Positive for headaches. Negative for seizures and syncope.  All other systems reviewed and are negative.  Physical Exam Updated Vital Signs BP (!) 154/98   Pulse 94   Temp 98.7 F (37.1 C) (Oral)   Resp 16   Ht 5\' 5"  (1.651 m)   Wt 105 kg   SpO2 96%   BMI 38.52 kg/m   Physical Exam Vitals and  nursing note reviewed.  Constitutional:      General: She is not in acute distress.    Appearance: She is well-developed.  HENT:     Head: Contusion present.     Comments: Contusion of left parietal-occipital region    Ears:     Comments: Dried blood in right ear canal    Mouth/Throat:     Mouth: Mucous membranes are moist.     Pharynx: Oropharynx is clear.  Eyes:     Extraocular Movements: Extraocular movements intact.     Conjunctiva/sclera: Conjunctivae normal.  Cardiovascular:     Rate and Rhythm: Normal rate. Rhythm irregular.     Heart sounds: No murmur heard.   Pulmonary:     Effort: Pulmonary effort is normal. No respiratory distress.     Breath sounds: Normal breath sounds.  Abdominal:     Palpations: Abdomen is soft.     Tenderness: There is no abdominal tenderness.  Musculoskeletal:     Cervical back: Neck supple.  Skin:    General: Skin is warm and dry.     Findings: No bruising.  Neurological:     General: No focal deficit present.     Mental Status: She is alert. Mental status is at baseline.  Psychiatric:        Mood and Affect: Mood normal.        Behavior: Behavior normal.    ED Results / Procedures / Treatments   Labs (all labs ordered are listed, but only abnormal results are displayed) Labs Reviewed  PROTIME-INR - Abnormal; Notable for the following components:      Result Value   Prothrombin Time 77.9 (*)    INR 9.7 (*)    All other components within normal limits  COMPREHENSIVE METABOLIC PANEL - Abnormal; Notable for the following components:   Sodium 134 (*)    Glucose, Bld 339 (*)    All other components within normal limits  CBC WITH DIFFERENTIAL/PLATELET - Abnormal; Notable for the following components:   RBC 5.22 (*)    All other components within normal limits  URINALYSIS, ROUTINE W REFLEX MICROSCOPIC - Abnormal; Notable for the following components:   Color, Urine AMBER (*)    APPearance CLOUDY (*)    Glucose, UA >=500 (*)    Hgb  urine dipstick LARGE (*)    Protein, ur 100 (*)    Leukocytes,Ua TRACE (*)    RBC / HPF >50 (*)    Bacteria, UA FEW (*)    All other components within normal limits  SARS CORONAVIRUS 2 (TAT 6-24 HRS)  PROTIME-INR  TYPE AND SCREEN   EKG None  Radiology CT HEAD WO CONTRAST  Result Date: 01/29/2021 CLINICAL DATA:  Headache, intracranial hemorrhage suspected. EXAM: CT HEAD WITHOUT CONTRAST TECHNIQUE: Contiguous axial images were obtained from the base of the skull through the vertex without intravenous contrast. COMPARISON:  CT head 01/13/2021 FINDINGS: Brain: Cerebral ventricle sizes are concordant with the degree of cerebral volume loss. Patchy and confluent areas of decreased attenuation are noted throughout the deep and periventricular white matter of the cerebral hemispheres bilaterally, compatible with chronic microvascular ischemic disease. No evidence of large-territorial acute infarction. No parenchymal hemorrhage. No mass lesion. No extra-axial collection. No mass effect or midline shift. No hydrocephalus. Basilar cisterns are patent. Vascular: No hyperdense vessel. Skull: No acute fracture or focal lesion. Sinuses/Orbits: Paranasal sinuses and mastoid air cells are clear. Bilateral lens thinning. Otherwise the orbits are unremarkable. Other: Interval development of two rounded 0.7 cm soft tissue densities in the region of the prior scalp hematoma (2:56). IMPRESSION: 1. No acute intracranial abnormality. 2. Interval development of two rounded 0.7 cm soft tissue densities in the region of the prior scalp hematoma. Finding likely represents residual of hematoma; however, consider correlation with physical exam for underlying lesion. Electronically Signed   By: Tish Frederickson M.D.   On: 01/29/2021 23:27    Medications Ordered in ED Medications  phytonadione (VITAMIN K) 5 mg in dextrose 5 % 50 mL IVPB (has no administration in time range)    ED Course  I have reviewed the triage vital  signs and the nursing notes.  Pertinent labs & imaging results that were available during my care of the patient were reviewed by me and considered in my medical decision making (see chart for details).    MDM Rules/Calculators/A&P                          Amy Wright is a 80 y.o. female with atrial fibrillation on coumadin who presents to the ED for evaluation of a supratherapeutic INR (9.7) in the setting of a fall two weeks prior and new onset of bleeding from right ear canal as well as red urine. Will obtain CT Head to rule out intracranial bleed. Will obtain urinalysis given bloody appearance of urine. Discussed patient's case with pharmacist with recommendation to provide patient with 5mg  IV Vitamin K while awaiting results of CT Head.  Overall, unclear etiology for patient's dramatic rise in her INR since 01/13/21. At that time, patient was noted to have subtherapeutic INR (1.6) and was advised to contact her doctor to discuss dosing. Per her report, patient has been adherent to 5mg  of warfarin daily with no recent dosage adjustments. She denies any new prescriptions that could interfere with her warfarin.  Patient's head CT does not reveal an intracranial hemorrhage. Discussed patient with pharmacist with recommendation to recheck INR at 0300 with plan to consider further vitamin K at that time. Updated patient's daughter, 03/15/21, on current plan of care and results of workup thus far.   Will place consult for unassigned admission. Discussed case with hospitalist who agrees for admission.  Final Clinical Impression(s) / ED Diagnoses Final diagnoses:  Supratherapeutic INR  Rx / DC Orders ED Discharge Orders    None       Roylene Reason, MD 01/29/21 2357    Tegeler, Canary Brim, MD 01/30/21 2223

## 2021-01-29 NOTE — ED Provider Notes (Signed)
Emergency Medicine Provider Triage Evaluation Note  Amy Wright , a 80 y.o. female  was evaluated in triage.  Pt complains of abnormal INR. On coumadin for afib. Had INR checked today, reported to be greater than 9. Feeling fatigued, maybe had some pink tinged urine today. Fell last week, seen at [facility] with reported neg workup.  Review of Systems  Positive: fatigue Negative: GI bleed  Physical Exam  BP (!) 152/100 (BP Location: Left Wrist)   Pulse (!) 105   Temp 97.7 F (36.5 C) (Oral)   Resp 20   SpO2 93%  Gen:   Awake, no distress   Resp:  Normal effort  MSK:   Moves extremities without difficulty   Medical Decision Making  Medically screening exam initiated at 7:26 PM.  Appropriate orders placed.  Trinadee Verhagen was informed that the remainder of the evaluation will be completed by another provider, this initial triage assessment does not replace that evaluation, and the importance of remaining in the ED until their evaluation is complete.    Crayton Savarese, [place] N, PA-C 01/29/21 1928    Wynetta Fines, MD 01/30/21 2256

## 2021-01-30 DIAGNOSIS — Z7984 Long term (current) use of oral hypoglycemic drugs: Secondary | ICD-10-CM | POA: Diagnosis not present

## 2021-01-30 DIAGNOSIS — I340 Nonrheumatic mitral (valve) insufficiency: Secondary | ICD-10-CM | POA: Diagnosis present

## 2021-01-30 DIAGNOSIS — Z809 Family history of malignant neoplasm, unspecified: Secondary | ICD-10-CM | POA: Diagnosis not present

## 2021-01-30 DIAGNOSIS — Z79899 Other long term (current) drug therapy: Secondary | ICD-10-CM | POA: Diagnosis not present

## 2021-01-30 DIAGNOSIS — F5104 Psychophysiologic insomnia: Secondary | ICD-10-CM | POA: Diagnosis present

## 2021-01-30 DIAGNOSIS — F419 Anxiety disorder, unspecified: Secondary | ICD-10-CM | POA: Diagnosis present

## 2021-01-30 DIAGNOSIS — D6832 Hemorrhagic disorder due to extrinsic circulating anticoagulants: Secondary | ICD-10-CM | POA: Diagnosis present

## 2021-01-30 DIAGNOSIS — W06XXXA Fall from bed, initial encounter: Secondary | ICD-10-CM | POA: Diagnosis present

## 2021-01-30 DIAGNOSIS — N390 Urinary tract infection, site not specified: Secondary | ICD-10-CM | POA: Diagnosis present

## 2021-01-30 DIAGNOSIS — D689 Coagulation defect, unspecified: Secondary | ICD-10-CM | POA: Diagnosis present

## 2021-01-30 DIAGNOSIS — I4819 Other persistent atrial fibrillation: Secondary | ICD-10-CM | POA: Diagnosis present

## 2021-01-30 DIAGNOSIS — E782 Mixed hyperlipidemia: Secondary | ICD-10-CM | POA: Diagnosis present

## 2021-01-30 DIAGNOSIS — Y92013 Bedroom of single-family (private) house as the place of occurrence of the external cause: Secondary | ICD-10-CM | POA: Diagnosis not present

## 2021-01-30 DIAGNOSIS — T45515A Adverse effect of anticoagulants, initial encounter: Secondary | ICD-10-CM | POA: Diagnosis present

## 2021-01-30 DIAGNOSIS — E781 Pure hyperglyceridemia: Secondary | ICD-10-CM | POA: Diagnosis present

## 2021-01-30 DIAGNOSIS — Z7901 Long term (current) use of anticoagulants: Secondary | ICD-10-CM | POA: Diagnosis not present

## 2021-01-30 DIAGNOSIS — Z801 Family history of malignant neoplasm of trachea, bronchus and lung: Secondary | ICD-10-CM | POA: Diagnosis not present

## 2021-01-30 DIAGNOSIS — E119 Type 2 diabetes mellitus without complications: Secondary | ICD-10-CM

## 2021-01-30 DIAGNOSIS — G4733 Obstructive sleep apnea (adult) (pediatric): Secondary | ICD-10-CM | POA: Diagnosis present

## 2021-01-30 DIAGNOSIS — H9221 Otorrhagia, right ear: Secondary | ICD-10-CM | POA: Diagnosis present

## 2021-01-30 DIAGNOSIS — R310 Gross hematuria: Secondary | ICD-10-CM | POA: Diagnosis present

## 2021-01-30 DIAGNOSIS — Z823 Family history of stroke: Secondary | ICD-10-CM | POA: Diagnosis not present

## 2021-01-30 DIAGNOSIS — Z20822 Contact with and (suspected) exposure to covid-19: Secondary | ICD-10-CM | POA: Diagnosis present

## 2021-01-30 DIAGNOSIS — R791 Abnormal coagulation profile: Secondary | ICD-10-CM | POA: Diagnosis not present

## 2021-01-30 DIAGNOSIS — I10 Essential (primary) hypertension: Secondary | ICD-10-CM | POA: Diagnosis present

## 2021-01-30 DIAGNOSIS — Z833 Family history of diabetes mellitus: Secondary | ICD-10-CM | POA: Diagnosis not present

## 2021-01-30 DIAGNOSIS — Z8249 Family history of ischemic heart disease and other diseases of the circulatory system: Secondary | ICD-10-CM | POA: Diagnosis not present

## 2021-01-30 DIAGNOSIS — Z7722 Contact with and (suspected) exposure to environmental tobacco smoke (acute) (chronic): Secondary | ICD-10-CM | POA: Diagnosis present

## 2021-01-30 LAB — CBC
HCT: 41 % (ref 36.0–46.0)
Hemoglobin: 13.1 g/dL (ref 12.0–15.0)
MCH: 27 pg (ref 26.0–34.0)
MCHC: 32 g/dL (ref 30.0–36.0)
MCV: 84.4 fL (ref 80.0–100.0)
Platelets: 335 10*3/uL (ref 150–400)
RBC: 4.86 MIL/uL (ref 3.87–5.11)
RDW: 15.2 % (ref 11.5–15.5)
WBC: 9.6 10*3/uL (ref 4.0–10.5)
nRBC: 0 % (ref 0.0–0.2)

## 2021-01-30 LAB — BASIC METABOLIC PANEL
Anion gap: 10 (ref 5–15)
BUN: 15 mg/dL (ref 8–23)
CO2: 22 mmol/L (ref 22–32)
Calcium: 9.9 mg/dL (ref 8.9–10.3)
Chloride: 102 mmol/L (ref 98–111)
Creatinine, Ser: 0.76 mg/dL (ref 0.44–1.00)
GFR, Estimated: >60 mL/min (ref >60)
Glucose, Bld: 266 mg/dL — ABNORMAL HIGH (ref 70–99)
Potassium: 4.4 mmol/L (ref 3.5–5.1)
Sodium: 134 mmol/L — ABNORMAL LOW (ref 135–145)

## 2021-01-30 LAB — PROTIME-INR
INR: 4.2 (ref 0.8–1.2)
INR: 2.3 — ABNORMAL HIGH (ref 0.8–1.2)
Prothrombin Time: 40.3 seconds — ABNORMAL HIGH (ref 11.4–15.2)
Prothrombin Time: 24.9 seconds — ABNORMAL HIGH (ref 11.4–15.2)

## 2021-01-30 LAB — GLUCOSE, CAPILLARY
Glucose-Capillary: 356 mg/dL — ABNORMAL HIGH (ref 70–99)
Glucose-Capillary: 205 mg/dL — ABNORMAL HIGH (ref 70–99)
Glucose-Capillary: 247 mg/dL — ABNORMAL HIGH (ref 70–99)

## 2021-01-30 LAB — SARS CORONAVIRUS 2 (TAT 6-24 HRS): SARS Coronavirus 2: NEGATIVE

## 2021-01-30 MED ORDER — METOPROLOL SUCCINATE ER 25 MG PO TB24
50.0000 mg | ORAL_TABLET | Freq: Every day | ORAL | Status: DC
  Administered 2021-01-30 – 2021-01-31 (×3): 50 mg via ORAL
  Filled 2021-01-30 (×3): qty 2

## 2021-01-30 MED ORDER — MAGNESIUM OXIDE -MG SUPPLEMENT 400 (240 MG) MG PO TABS
400.0000 mg | ORAL_TABLET | Freq: Every day | ORAL | Status: DC
  Administered 2021-01-30 – 2021-02-01 (×3): 400 mg via ORAL
  Filled 2021-01-30 (×3): qty 1

## 2021-01-30 MED ORDER — ZOLPIDEM TARTRATE 5 MG PO TABS
5.0000 mg | ORAL_TABLET | Freq: Every evening | ORAL | Status: DC | PRN
  Administered 2021-01-30: 5 mg via ORAL
  Filled 2021-01-30 (×2): qty 1

## 2021-01-30 MED ORDER — SERTRALINE HCL 100 MG PO TABS
100.0000 mg | ORAL_TABLET | Freq: Every morning | ORAL | Status: DC
  Administered 2021-01-30 – 2021-02-01 (×3): 100 mg via ORAL
  Filled 2021-01-30 (×3): qty 1

## 2021-01-30 MED ORDER — EZETIMIBE 10 MG PO TABS
10.0000 mg | ORAL_TABLET | Freq: Every day | ORAL | Status: DC
  Administered 2021-01-30 – 2021-01-31 (×3): 10 mg via ORAL
  Filled 2021-01-30 (×3): qty 1

## 2021-01-30 MED ORDER — METOPROLOL SUCCINATE ER 100 MG PO TB24
100.0000 mg | ORAL_TABLET | Freq: Every day | ORAL | Status: DC
  Administered 2021-01-30 – 2021-02-01 (×3): 100 mg via ORAL
  Filled 2021-01-30 (×3): qty 1

## 2021-01-30 MED ORDER — SODIUM CHLORIDE 0.9 % IV SOLN
1.0000 g | Freq: Every day | INTRAVENOUS | Status: DC
  Administered 2021-01-30 – 2021-01-31 (×2): 1 g via INTRAVENOUS
  Filled 2021-01-30 (×3): qty 10

## 2021-01-30 MED ORDER — MELATONIN 3 MG PO TABS
3.0000 mg | ORAL_TABLET | Freq: Every day | ORAL | Status: DC
  Administered 2021-01-30 – 2021-01-31 (×3): 3 mg via ORAL
  Filled 2021-01-30 (×3): qty 1

## 2021-01-30 MED ORDER — LORAZEPAM 0.5 MG PO TABS
0.5000 mg | ORAL_TABLET | Freq: Every day | ORAL | Status: DC | PRN
  Administered 2021-01-30 – 2021-01-31 (×2): 0.5 mg via ORAL
  Filled 2021-01-30 (×2): qty 1

## 2021-01-30 MED ORDER — DILTIAZEM HCL ER COATED BEADS 180 MG PO CP24
360.0000 mg | ORAL_CAPSULE | Freq: Every day | ORAL | Status: DC
  Administered 2021-01-30 – 2021-02-01 (×3): 360 mg via ORAL
  Filled 2021-01-30 (×2): qty 2
  Filled 2021-01-30: qty 1
  Filled 2021-01-30: qty 2

## 2021-01-30 MED ORDER — INSULIN ASPART 100 UNIT/ML IJ SOLN
0.0000 [IU] | Freq: Three times a day (TID) | INTRAMUSCULAR | Status: DC
  Administered 2021-01-30: 3 [IU] via SUBCUTANEOUS
  Administered 2021-01-30: 9 [IU] via SUBCUTANEOUS
  Administered 2021-01-31 (×3): 5 [IU] via SUBCUTANEOUS
  Administered 2021-02-01: 7 [IU] via SUBCUTANEOUS
  Administered 2021-02-01: 2 [IU] via SUBCUTANEOUS

## 2021-01-30 MED ORDER — WARFARIN - PHARMACIST DOSING INPATIENT: Freq: Every day | Status: DC

## 2021-01-30 MED ORDER — WARFARIN SODIUM 4 MG PO TABS
4.0000 mg | ORAL_TABLET | Freq: Once | ORAL | Status: AC
  Administered 2021-01-30: 4 mg via ORAL
  Filled 2021-01-30: qty 1

## 2021-01-30 MED ORDER — ACETAMINOPHEN 325 MG PO TABS
650.0000 mg | ORAL_TABLET | Freq: Four times a day (QID) | ORAL | Status: DC | PRN
  Administered 2021-01-30: 650 mg via ORAL
  Filled 2021-01-30: qty 2

## 2021-01-30 MED ORDER — IRBESARTAN 150 MG PO TABS
150.0000 mg | ORAL_TABLET | Freq: Every day | ORAL | Status: DC
  Administered 2021-01-30 – 2021-02-01 (×3): 150 mg via ORAL
  Filled 2021-01-30 (×4): qty 1

## 2021-01-30 NOTE — Progress Notes (Addendum)
Subjective: Patient admitted this morning, see detailed H&P by Dr Toniann Fail 80 year old female with history of atrial fibrillation, hypertension, sleep apnea, diabetes mellitus type 2 was advised to come to the ED after patient's INR was found to be elevated.  Patient came to ED after fall on May 8 at that time CT head was unremarkable.  INR was around 1.6.  Patient noticed bleeding from right ear yesterday which has stopped.  Also noted frank hematuria.  And was found to be elevated 9.7.  She was given 1 dose of 5 mg IV vitamin K.  Placed under observation.  Vitals:   01/30/21 0835 01/30/21 0915  BP:  (!) 164/118  Pulse:  93  Resp:  16  Temp: 97.9 F (36.6 C) 97.6 F (36.4 C)  SpO2:  94%      A/P Coagulopathy-secondary to Coumadin with hematuria and right ear bleeding.  INR was 9.7 on presentation, after receiving vitamin K 5 mg IV x1, INR this morning is 2.3.  Continue to monitor for bleeding.  Pharmacy managing patient's Coumadin dosing.  ?  UTI-patient started on IV ceftriaxone.  Urine culture obtained, currently result is pending.  Persistent atrial fibrillation-continue Cardizem CD, metoprolol.  Coumadin per pharmacy.  Hypertension-blood pressure controlled, continue losartan, Cardizem.  Diabetes mellitus type 2-blood glucose has been elevated.  Hemoglobin A1c obtained, result is currently pending.      Meredeth Ide [facility] [phone]

## 2021-01-30 NOTE — H&P (Signed)
History and Physical    Amy Wright KDX:[id number] DOB: 1940-10-11 DOA: 01/29/2021  PCP: Angelica Chessman, MD   Patient coming from: Home.  Chief Complaint: Elevated INR.  HPI: Amy Wright is a 80 y.o. female with history of A. fib, hypertension, sleep apnea, diabetes mellitus type 2 was advised to come to the ER after patient's INR was found to be elevated.  Patient had come to the ER after having a fall on May 8 at the time CT head was unremarkable INR was around 1.6.  Patient states he has not had any changes in the medication including her Coumadin dose.  Patient had gone for routine Coumadin/INR check today and was found to be elevated was advised to come to the ER.  Patient also has some bleeding from the right ear since yesterday morning.  Which has stopped.  Patient also noticed frank hematuria.  Last couple of days patient has been having increased frequency of urination and increased odor from the urine.  Denies fever chills nausea vomiting chest pain or shortness of breath.  ED Course: In the ER patient's INR is found to be around 9.7 blood glucose was 399.  UA is showing lot of WBCs RBCs few bacteria and leukocyte esterase positive.  Patient was given 5 mg IV vitamin K and admitted for further observation with repeating INR given that patient is having bleeding with hematuria and right ear bleeding which is stopped.  CT head again was repeated which was unremarkable.  COVID test negative.  Review of Systems: As per HPI, rest all negative.   Past Medical History:  Diagnosis Date  . Anxiety   . Chronic insomnia   . Hyperlipidemia   . Hypertension   . Hypertriglyceridemia   . Obesity   . OSA on CPAP   . Persistent atrial fibrillation (HCC) 11/07/14   Chads2vasc score of at least 4  . Type II diabetes mellitus (HCC)   . 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 Central jet. Aortic Valve appears to be mildly sclerotic. Trace  aortic regurgitation.EF >55%    Past Surgical History:  Procedure Laterality Date  . APPENDECTOMY  1971  . CARDIOVERSION N/A 06/23/2013   Procedure: CARDIOVERSION;  Surgeon: Lennette Bihari, MD;  Location: [facility] ENDOSCOPY;  Service: Cardiovascular;  Laterality: N/A;  . CATARACT EXTRACTION W/ INTRAOCULAR LENS IMPLANT Bilateral   . CESAREAN SECTION  1971  . PLACEMENT OF BREAST IMPLANTS Bilateral 1985     reports that she is a non-smoker but has been exposed to tobacco smoke. She has never used smokeless tobacco. She reports current alcohol use. She reports that she does not use drugs.  Allergies  Allergen Reactions  . Rosuvastatin Calcium Other (See Comments)    myalgias   . Statins Other (See Comments)    myalgia  . Amoxicillin Rash  . Atorvastatin Other (See Comments)    Back pain   . Ezetimibe-Simvastatin Other (See Comments)    Back pain   . Penicillins Itching, Swelling and Rash    Did it involve swelling of the face/tongue/throat, SOB, or low BP? No Did it involve sudden or severe rash/hives, skin peeling, or any reaction on the inside of your mouth or nose? Yes Did you need to seek medical attention at a hospital or doctor's office? No When did it last happen?young adult If all above answers are "NO", may proceed with cephalosporin use.    Family History  Problem Relation Age of  Onset  . Heart attack Mother   . Cardiomyopathy Mother   . Cancer - Lung Father   . Cancer - Other Maternal Grandmother   . Stroke Paternal Grandfather   . Diabetes Maternal Grandfather     Prior to Admission medications   Medication Sig Start Date End Date Taking? Authorizing Provider  ACCU-CHEK COMPACT PLUS test strip CHECK BLOOD SUGAR TWICE DAILY OR AS DIRECTED 02/27/16   [provider]  acetaminophen (TYLENOL) 500 MG tablet Take 500 mg by mouth every 6 (six) hours as needed for headache (pain).    [provider]  Ascorbic Acid (VITAMIN C) 1000 MG tablet Take  3,000 mg by mouth daily.    [provider]  cholecalciferol (VITAMIN D3) 25 MCG (1000 UT) tablet Take 1,000 Units by mouth daily.    [provider]  Choline Fenofibrate 135 MG capsule Take 135 mg by mouth every morning.     [provider]  diltiazem (CARDIZEM CD) 360 MG 24 hr capsule TAKE 1 CAPSULE BY MOUTH EVERY MORNING. OVERDUE APPT WITH DR. ALLRED BEFORE ANYMORE REFILLS. 01/22/21   Allred, Fayrene Fearing, MD  ezetimibe (ZETIA) 10 MG tablet Take 10 mg by mouth at bedtime.     [provider]  folic acid (FOLVITE) 1 MG tablet Take 1 mg by mouth every morning.     [provider]  glipiZIDE (GLUCOTROL) 10 MG tablet Take 10 mg by mouth every morning.  03/24/17   [provider]  LORazepam (ATIVAN) 0.5 MG tablet Take 0.5 mg by mouth daily as needed for anxiety (jitters).     [provider]  magnesium oxide (MAG-OX) 400 MG tablet Take 1 tablet (400 mg total) by mouth daily. 02/14/16   Gypsy Balsam K, NP  Melatonin 3 MG TABS Take 3 mg by mouth at bedtime.     [provider]  metFORMIN (GLUCOPHAGE) 1000 MG tablet Take 1,000 mg by mouth 2 (two) times daily with a meal.    [provider]  metoprolol succinate (TOPROL-XL) 50 MG 24 hr tablet TAKE 2 TABLETS IN THE MORNING. TAKE 1 TABLET AT NIGHT. PLEASE KEEP UPCOMING APPT IN APRIL 2022 BEFORE ANYMORE REFILLS. THANK YOU 01/14/21   Allred, Fayrene Fearing, MD  potassium chloride (KLOR-CON) 10 MEQ tablet TAKE 1 TABLET BY MOUTH EVERY DAY 01/14/21   Allred, Fayrene Fearing, MD  sertraline (ZOLOFT) 100 MG tablet Take 150 mg by mouth every morning.  02/04/17   [provider]  sodium chloride (OCEAN) 0.65 % nasal spray Place 1 spray into the nose as needed for congestion.    [provider]  telmisartan (MICARDIS) 40 MG tablet Take 40 mg by mouth daily.    [provider]  vitamin B-12 (CYANOCOBALAMIN) 100 MCG tablet Take 100 mcg by mouth every morning.     [provider]  VITAMIN  E PO Take 1 capsule by mouth once a week.    [provider]  warfarin (COUMADIN) 5 MG tablet Take 1 tablet daily as directed, must schedule INR appt for further refills 09/24/20   Allred, Fayrene Fearing, MD  zolpidem (AMBIEN CR) 6.25 MG CR tablet Take 6.25 mg by mouth at bedtime.  09/26/14   [provider]    Physical Exam: Constitutional: Moderately built and nourished. Vitals:   01/29/21 1911 01/29/21 1930 01/29/21 2057 01/29/21 2252  BP: (!) 152/100  (!) 155/110 (!) 154/98  Pulse: (!) 105  93 94  Resp: 20  15 16   Temp: 97.7 F (  36.5 C)  98.7 F (37.1 C)   TempSrc: Oral  Oral   SpO2: 93%  95% 96%  Weight:  105 kg    Height:  5\' 5"  (1.651 m)     Eyes: Anicteric no pallor. ENMT: No discharge from the ears eyes nose or mouth. Neck: No mass felt.  No neck rigidity. Respiratory: No rhonchi or crepitations. Cardiovascular: S1-S2 heard. Abdomen: Soft nontender bowel sounds present. Musculoskeletal: No edema. Skin: No rash. Neurologic: Alert awake oriented time place and person.  Moves all extremities. Psychiatric: Appears normal.  Normal affect.   Labs on Admission: I have personally reviewed following labs and imaging studies  CBC: Recent Labs  Lab 01/29/21 1940  WBC 9.8  NEUTROABS 7.1  HGB 13.7  HCT 43.2  MCV 82.8  PLT 394   Basic Metabolic Panel: Recent Labs  Lab 01/29/21 1940  NA 134*  K 4.6  CL 103  CO2 23  GLUCOSE 339*  BUN 15  CREATININE 0.89  CALCIUM 9.9   GFR: Estimated Creatinine Clearance: 62.7 mL/min (by C-G formula based on SCr of 0.89 mg/dL). Liver Function Tests: Recent Labs  Lab 01/29/21 1940  AST 28  ALT 21  ALKPHOS 71  BILITOT 0.9  PROT 7.4  ALBUMIN 3.7   No results for input(s): LIPASE, AMYLASE in the last 168 hours. No results for input(s): AMMONIA in the last 168 hours. Coagulation Profile: Recent Labs  Lab 01/29/21 1940  INR 9.7*   Cardiac Enzymes: No results for input(s): CKTOTAL, CKMB, CKMBINDEX, TROPONINI in  the last 168 hours. BNP (last 3 results) No results for input(s): PROBNP in the last 8760 hours. HbA1C: No results for input(s): HGBA1C in the last 72 hours. CBG: No results for input(s): GLUCAP in the last 168 hours. Lipid Profile: No results for input(s): CHOL, HDL, LDLCALC, TRIG, CHOLHDL, LDLDIRECT in the last 72 hours. Thyroid Function Tests: No results for input(s): TSH, T4TOTAL, FREET4, T3FREE, THYROIDAB in the last 72 hours. Anemia Panel: No results for input(s): VITAMINB12, FOLATE, FERRITIN, TIBC, IRON, RETICCTPCT in the last 72 hours. Urine analysis:    Component Value Date/Time   COLORURINE AMBER (A) 01/29/2021 2240   APPEARANCEUR CLOUDY (A) 01/29/2021 2240   LABSPEC 1.025 01/29/2021 2240   PHURINE 5.0 01/29/2021 2240   GLUCOSEU >=500 (A) 01/29/2021 2240   HGBUR LARGE (A) 01/29/2021 2240   BILIRUBINUR NEGATIVE 01/29/2021 2240   KETONESUR NEGATIVE 01/29/2021 2240   PROTEINUR 100 (A) 01/29/2021 2240   NITRITE NEGATIVE 01/29/2021 2240   LEUKOCYTESUR TRACE (A) 01/29/2021 2240   Sepsis Labs: @LABRCNTIP (procalcitonin:4,lacticidven:4) )No results found for this or any previous visit (from the past 240 hour(s)).   Radiological Exams on Admission: CT HEAD WO CONTRAST  Result Date: 01/29/2021 CLINICAL DATA:  Headache, intracranial hemorrhage suspected. EXAM: CT HEAD WITHOUT CONTRAST TECHNIQUE: Contiguous axial images were obtained from the base of the skull through the vertex without intravenous contrast. COMPARISON:  CT head 01/13/2021 FINDINGS: Brain: Cerebral ventricle sizes are concordant with the degree of cerebral volume loss. Patchy and confluent areas of decreased attenuation are noted throughout the deep and periventricular white matter of the cerebral hemispheres bilaterally, compatible with chronic microvascular ischemic disease. No evidence of large-territorial acute infarction. No parenchymal hemorrhage. No mass lesion. No extra-axial collection. No mass effect or  midline shift. No hydrocephalus. Basilar cisterns are patent. Vascular: No hyperdense vessel. Skull: No acute fracture or focal lesion. Sinuses/Orbits: Paranasal sinuses and mastoid air cells are clear. Bilateral lens thinning. Otherwise the orbits are  unremarkable. Other: Interval development of two rounded 0.7 cm soft tissue densities in the region of the prior scalp hematoma (2:56). IMPRESSION: 1. No acute intracranial abnormality. 2. Interval development of two rounded 0.7 cm soft tissue densities in the region of the prior scalp hematoma. Finding likely represents residual of hematoma; however, consider correlation with physical exam for underlying lesion. Electronically Signed   By: Tish Frederickson M.D.   On: 01/29/2021 23:27     Assessment/Plan Principal Problem:   Coagulopathy (HCC) Active Problems:   Persistent atrial fibrillation (HCC)   DM2 (diabetes mellitus, type 2) (HCC)    1. Coagulopathy secondary to Coumadin with the hematuria and right ear bleeding at this time with INR was 9.7 pharmacy last dose of vitamin K 5 mg IV 1 dose.  We will be repeating INR in few hours and closely monitor for any further bleeding. 2. Possible UTI with patient having increasing urinary frequency and odor we will check urine culture keep patient on ceftriaxone. 3. Persistent A. fib on Cardizem CD and metoprolol.  Holding Coumadin due to coagulopathy with elevated INR. 4. Hypertension on losartan Cardizem and metoprolol. 5. Diabetes mellitus type 2 uncontrolled we will check hemoglobin A1c with next blood draw.  If blood sugar remains persistently elevated we will keep patient on Lantus. 6. History of sleep apnea.   DVT prophylaxis: Patient has coagulopathy. Code Status: Full code. Family Communication: Patient's husband at the bedside. Disposition Plan: Home when stable. Consults called: None. Admission status: Observation.   Eduard Clos MD [facility] Pager [phone].  If  7PM-7AM, please contact night-coverage [email or web address] Password [email or web address]  01/30/2021, 12:07 AM

## 2021-01-30 NOTE — Progress Notes (Addendum)
ANTICOAGULATION CONSULT NOTE - Initial Consult  Pharmacy Consult for Coumadin Indication: atrial fibrillation  Allergies  Allergen Reactions  . Rosuvastatin Calcium Other (See Comments)    myalgias   . Statins Other (See Comments)    myalgia  . Amoxicillin Rash  . Atorvastatin Other (See Comments)    Back pain   . Ezetimibe-Simvastatin Other (See Comments)    Back pain   . Penicillins Itching, Swelling and Rash    Did it involve swelling of the face/tongue/throat, SOB, or low BP? No Did it involve sudden or severe rash/hives, skin peeling, or any reaction on the inside of your mouth or nose? Yes Did you need to seek medical attention at a hospital or doctor's office? No When did it last happen?young adult If all above answers are "NO", may proceed with cephalosporin use.    Patient Measurements: Height: 5\' 5"  (165.1 cm) Weight: 105 kg (231 lb 7.7 oz) IBW/kg (Calculated) : 57  Vital Signs: Temp: 97.6 F (36.4 C) (05/25 0915) Temp Source: Oral (05/25 0915) BP: 164/118 (05/25 0915) Pulse Rate: 93 (05/25 0915)  Labs: Recent Labs    01/29/21 1940 01/30/21 0248 01/30/21 0330 01/30/21 1029  HGB 13.7  --  13.1  --   HCT 43.2  --  41.0  --   PLT 394  --  335  --   LABPROT 77.9* 40.3*  --  24.9*  INR 9.7* 4.2*  --  2.3*  CREATININE 0.89  --  0.76  --     Estimated Creatinine Clearance: 69.7 mL/min (by C-G formula based on SCr of 0.76 mg/dL).   Medical History: Past Medical History:  Diagnosis Date  . Anxiety   . Chronic insomnia   . Hyperlipidemia   . Hypertension   . Hypertriglyceridemia   . Obesity   . OSA on CPAP   . Persistent atrial fibrillation (HCC) 11/07/14   Chads2vasc score of at least 4  . Type II diabetes mellitus (HCC)   . 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 Central jet. Aortic Valve appears to be mildly sclerotic. Trace aortic regurgitation.EF >55%     Assessment: 80 y.o. female  admitted with INR of 9.7 with ear bleeding and hematuria, on warfarin for persistent Afib. Vitamin K IV 5mg  given. Pharmacy consulted for warfarin dosing.    INR down to 2.3. Expect will continue to downtrend for days after IV vitamin K. H/H, plt stable. Per patient, has been eating less since starting semaglutide. Patient unable to quantify vegetable intake but states is a picky eater but hast been eating more green beans lately. Denied med changes other than Ozempic, which was started 2 months ago. No further bleeding.   Warfarin PTA: 5mg  daily (confirmed with patient, followed by PCP at [facility])   Goal of Therapy:  INR 2-3 Monitor platelets by anticoagulation protocol: Yes   Plan:  Warfarin 4mg  x1 today Monitor daily INR, CBC/plt Monitor for signs/symptoms of bleeding  Asked patient to make PCP appointment for INR check next week, she stated she would   , PharmD, BCPS, [facility] Clinical Pharmacist  Please check AMION for all [facility] phone numbers After 10:00 PM, call Main Pharmacy [phone]

## 2021-01-30 NOTE — Progress Notes (Signed)
ANTICOAGULATION CONSULT NOTE - Initial Consult  Pharmacy Consult for Coumadin Indication: atrial fibrillation  Allergies  Allergen Reactions  . Rosuvastatin Calcium Other (See Comments)    myalgias   . Statins Other (See Comments)    myalgia  . Amoxicillin Rash  . Atorvastatin Other (See Comments)    Back pain   . Ezetimibe-Simvastatin Other (See Comments)    Back pain   . Penicillins Itching, Swelling and Rash    Did it involve swelling of the face/tongue/throat, SOB, or low BP? No Did it involve sudden or severe rash/hives, skin peeling, or any reaction on the inside of your mouth or nose? Yes Did you need to seek medical attention at a hospital or doctor's office? No When did it last happen?young adult If all above answers are "NO", may proceed with cephalosporin use.    Patient Measurements: Height: 5\' 5"  (165.1 cm) Weight: 105 kg (231 lb 7.7 oz) IBW/kg (Calculated) : 57  Vital Signs: Temp: 98.7 F (37.1 C) (05/24 2057) Temp Source: Oral (05/24 2057) BP: 141/96 (05/25 0230) Pulse Rate: 70 (05/25 0230)  Labs: Recent Labs    01/29/21 1940 01/30/21 0248 01/30/21 0330  HGB 13.7  --  13.1  HCT 43.2  --  41.0  PLT 394  --  335  LABPROT 77.9* 40.3*  --   INR 9.7* 4.2*  --   CREATININE 0.89  --  0.76    Estimated Creatinine Clearance: 69.7 mL/min (by C-G formula based on SCr of 0.76 mg/dL).   Medical History: Past Medical History:  Diagnosis Date  . Anxiety   . Chronic insomnia   . Hyperlipidemia   . Hypertension   . Hypertriglyceridemia   . Obesity   . OSA on CPAP   . Persistent atrial fibrillation (HCC) 11/07/14   Chads2vasc score of at least 4  . Type II diabetes mellitus (HCC)   . 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 Central jet. Aortic Valve appears to be mildly sclerotic. Trace aortic regurgitation.EF >55%    Medications:  No current facility-administered medications on file prior to  encounter.   Current Outpatient Medications on File Prior to Encounter  Medication Sig Dispense Refill  . ACCU-CHEK COMPACT PLUS test strip CHECK BLOOD SUGAR TWICE DAILY OR AS DIRECTED  0  . acetaminophen (TYLENOL) 500 MG tablet Take 500 mg by mouth every 6 (six) hours as needed for headache (pain).    . Ascorbic Acid (VITAMIN C) 1000 MG tablet Take 3,000 mg by mouth daily.    . cholecalciferol (VITAMIN D3) 25 MCG (1000 UT) tablet Take 1,000 Units by mouth daily.    . Choline Fenofibrate 135 MG capsule Take 135 mg by mouth every morning.     . diltiazem (CARDIZEM CD) 360 MG 24 hr capsule TAKE 1 CAPSULE BY MOUTH EVERY MORNING. OVERDUE APPT WITH DR. ALLRED BEFORE ANYMORE REFILLS. 90 capsule 3  . ezetimibe (ZETIA) 10 MG tablet Take 10 mg by mouth at bedtime.     . folic acid (FOLVITE) 1 MG tablet Take 1 mg by mouth every morning.     05/02/11 glipiZIDE (GLUCOTROL) 10 MG tablet Take 10 mg by mouth every morning.     Marland Kitchen LORazepam (ATIVAN) 0.5 MG tablet Take 0.5 mg by mouth daily as needed for anxiety (jitters).     . magnesium oxide (MAG-OX) 400 MG tablet Take 1 tablet (400 mg total) by mouth daily. 30 tablet 5  . Melatonin 3 MG TABS  Take 3 mg by mouth at bedtime.     . metFORMIN (GLUCOPHAGE) 1000 MG tablet Take 1,000 mg by mouth 2 (two) times daily with a meal.    . metoprolol succinate (TOPROL-XL) 50 MG 24 hr tablet TAKE 2 TABLETS IN THE MORNING. TAKE 1 TABLET AT NIGHT. PLEASE KEEP UPCOMING APPT IN APRIL 2022 BEFORE ANYMORE REFILLS. THANK YOU 270 tablet 3  . potassium chloride (KLOR-CON) 10 MEQ tablet TAKE 1 TABLET BY MOUTH EVERY DAY 90 tablet 3  . sertraline (ZOLOFT) 100 MG tablet Take 150 mg by mouth every morning.     . sodium chloride (OCEAN) 0.65 % nasal spray Place 1 spray into the nose as needed for congestion.    Marland Kitchen telmisartan (MICARDIS) 40 MG tablet Take 40 mg by mouth daily.    . vitamin B-12 (CYANOCOBALAMIN) 100 MCG tablet Take 100 mcg by mouth every morning.     Marland Kitchen VITAMIN E PO Take 1 capsule  by mouth once a week.    . warfarin (COUMADIN) 5 MG tablet Take 1 tablet daily as directed, must schedule INR appt for further refills 15 tablet 1  . zolpidem (AMBIEN CR) 6.25 MG CR tablet Take 6.25 mg by mouth at bedtime.        Assessment: 80 y.o. female admitted with elevated INR, h/o AFib, for Coumadin management.  Initial INR 9.7, s/p Vitamin K 5 mg at midnight, and INR now 4.2    Goal of Therapy:  INR 2-3 Monitor platelets by anticoagulation protocol: Yes   Plan:  F/U INR late this morning and redose Coumadin at appropriate    Kaevon Cotta, Gary Fleet 01/30/2021,4:58 AM

## 2021-01-30 NOTE — ED Notes (Signed)
Report attempted.  Call back in 5.

## 2021-01-31 DIAGNOSIS — R791 Abnormal coagulation profile: Secondary | ICD-10-CM

## 2021-01-31 LAB — URINE CULTURE

## 2021-01-31 LAB — CBC
HCT: 39.3 % (ref 36.0–46.0)
Hemoglobin: 13 g/dL (ref 12.0–15.0)
MCH: 26.8 pg (ref 26.0–34.0)
MCHC: 33.1 g/dL (ref 30.0–36.0)
MCV: 81 fL (ref 80.0–100.0)
Platelets: 329 10*3/uL (ref 150–400)
RBC: 4.85 MIL/uL (ref 3.87–5.11)
RDW: 15.2 % (ref 11.5–15.5)
WBC: 8.4 10*3/uL (ref 4.0–10.5)
nRBC: 0 % (ref 0.0–0.2)

## 2021-01-31 LAB — GLUCOSE, CAPILLARY
Glucose-Capillary: 286 mg/dL — ABNORMAL HIGH (ref 70–99)
Glucose-Capillary: 273 mg/dL — ABNORMAL HIGH (ref 70–99)
Glucose-Capillary: 252 mg/dL — ABNORMAL HIGH (ref 70–99)
Glucose-Capillary: 272 mg/dL — ABNORMAL HIGH (ref 70–99)

## 2021-01-31 LAB — PROTIME-INR
INR: 2.1 — ABNORMAL HIGH (ref 0.8–1.2)
Prothrombin Time: 23.3 seconds — ABNORMAL HIGH (ref 11.4–15.2)

## 2021-01-31 MED ORDER — SALINE SPRAY 0.65 % NA SOLN
2.0000 | NASAL | Status: DC
  Administered 2021-01-31 – 2021-02-01 (×7): 2 via NASAL
  Filled 2021-01-31 (×2): qty 44

## 2021-01-31 MED ORDER — VITAMIN K1 10 MG/ML IJ SOLN
5.0000 mg | Freq: Once | INTRAVENOUS | Status: AC
  Administered 2021-01-31: 5 mg via INTRAVENOUS
  Filled 2021-01-31: qty 0.5

## 2021-01-31 MED ORDER — OXYMETAZOLINE HCL 0.05 % NA SOLN
1.0000 | Freq: Two times a day (BID) | NASAL | Status: DC | PRN
  Administered 2021-01-31 – 2021-02-01 (×2): 1 via NASAL
  Filled 2021-01-31: qty 30

## 2021-01-31 MED ORDER — INSULIN GLARGINE 100 UNIT/ML ~~LOC~~ SOLN
20.0000 [IU] | Freq: Every day | SUBCUTANEOUS | Status: DC
  Administered 2021-01-31: 20 [IU] via SUBCUTANEOUS
  Filled 2021-01-31 (×2): qty 0.2

## 2021-01-31 MED ORDER — HYDRALAZINE HCL 25 MG PO TABS
25.0000 mg | ORAL_TABLET | Freq: Four times a day (QID) | ORAL | Status: DC | PRN
  Administered 2021-01-31: 25 mg via ORAL
  Filled 2021-01-31: qty 1

## 2021-01-31 NOTE — Progress Notes (Signed)
Patient called and said she was having nosebleed. Upon assessment, patient had saturated one washcloth when I arrived. Sharl Ma, MD paged immediately and responded within a few minutes. Nasal spray ordered and given to patient. Roughly 6 washcloths were used. Patient is sitting up pinching bridge of nose. Vitamin K IV ordered, pharmacy is sending up. Patient does not want me to leave room, so I am staying in here with patient for the time being. Patient is alert and oriented x4 at this time and claims she is not feeling dizzy as of now. ENT is on their way to assess.

## 2021-01-31 NOTE — Procedures (Signed)
Preop diagnosis: Right epistaxis Postop diagnosis: same Procedure: Simple anterior cautery Surgeon: Jenne Pane Anesth: Topical with 4% lidocaine Compl: None Findings: Small bleeding site on right anterior septum. Description:  After discussing risks, benefits, and alternatives, the patient was placed in a seated position and the right nasal passage was examined.  The bleeding site was identified.  The right nasal passage was packed with cotton soaked with lidocaine for a few minutes.  The cotton was then removed and the bleeding site was cauterized with silver nitrate.  After repacking with a cotton ball for a few minutes, the bleeding site was controlled.  The cotton was removed.   She was returned to nursing care in stable condition.

## 2021-01-31 NOTE — Progress Notes (Signed)
Inpatient Diabetes Program Recommendations  AACE/ADA: New Consensus Statement on Inpatient Glycemic Control   Target Ranges:  Prepandial:   less than 140 mg/dL      Peak postprandial:   less than 180 mg/dL (1-2 hours)      Critically ill patients:  140 - 180 mg/dL  Results for Amy Wright, Amy Wright (MRN [id number]) as of 01/31/2021 10:20  Ref. Range 01/30/2021 12:26 01/30/2021 16:59 01/30/2021 21:15  Glucose-Capillary Latest Ref Range: 70 - 99 mg/dL 194 (H) 174 (H) 081 (H)    Review of Glycemic Control  Diabetes history: DM2 Outpatient Diabetes medications: Glipizide 10 mg daily, Metformin 1000 mg BID, Ozempic 0.5 mg Qweek (started on 11/30/20 and stopped taking Lantus 55 units daily at that time) Current orders for Inpatient glycemic control: Novolog 0-9 units TID with meals  Inpatient Diabetes Program Recommendations:    Insulin: Please consider ordering Lantus 20 units daily and Novolog 0-5 units QHS.  NOTE: In reviewing chart, noted patient seen PCP Dr. Riley Nearing on 11/30/20 and per office note, patient was taking Lantus 55 units daily, Glipizide 10 mg daily, Metformin 1000 mg BID and was started on Ozempic 0.25 mg Qweek. Noted several telephone calls in the chart following that visit where patient called about hyperglycemia and was told to increase Ozempic dose to 0.50 mg Qweek. Spoke with patient over the phone and she confirms that she has been taking Glipzide, Metformin, and Ozempic 0.5 mg Qweek (Sundays; has taken this increased dose for 2 weeks). Patient states that she was told by PCP to stop taking the Lantus when she was started on the Ozempic. Noted in office visit note on 11/30/20 that it says "Start Ozempic weekly and hopefully we can reduce insulin dosage in the near future." Explained to patient that per the office note on 11/30/20, it looked like her doctor wanted her to continue taking insulin and was hopeful to decrease dose. Patient states that she was told to stop the Lantus when she  started the Ozempic so that is what she done. Patient reports that since starting the Ozempic, she feels the best she has felt in years, is able to be more active, and not feeling hungry and like she wants to [place] all the time. However, she states that her glucose has been consistently in 200-300's mg/dl since stopping insulin and starting Ozempic.  Patient asked about increasing Ozempic dose. Explained that max dose of Ozempic is 1 mg Qweek and increase in dosages should be gradual. Discussed that she likely needs to be taking some of the Lantus insulin daily along with the Ozempic and oral DM meds to get DM under better control. Noted patient has been referred to Endocrinology by PCP. Asked patient about an initial visit with Endocrinology and patient has not set up an appointment yet. Encouraged patient to get an appointment with an Endocrinologist to establish care and allow them to assist with DM management. Patient definitely wants to get her DM under better control and also wants to continue with Ozempic. Informed patient that I would let attending provider at the hospital know about recent changes with DM medications and they can let her know what to resume at discharge regarding DM medications. Patient appreciative of information discussed and verbalized understanding of information and has no questions at this time related to DM.  Thanks, Orlando Penner, RN, MSN, CDE Diabetes Coordinator Inpatient Diabetes Program [phone] (Team Pager from 8am to 5pm)

## 2021-01-31 NOTE — Progress Notes (Signed)
ENT physician is in the room currently, vitamin k bag was delivered,will hang once ENT is done with patient. Patient is still alert and oriented x4. Not actively bleeding anymore as of now.

## 2021-01-31 NOTE — Consult Note (Signed)
Reason for Consult: Epistaxis Referring Physician: Hospitalist  Amy Wright is an 80 y.o. female.  HPI: 80 year old female on Coumadin for atrial fibrillation and recently found to have an INR of 9 who has had a couple of episodes of right-sided epistaxis including today.  She has been given vitamin K to bring her INR down.  Past Medical History:  Diagnosis Date  . Anxiety   . Chronic insomnia   . Hyperlipidemia   . Hypertension   . Hypertriglyceridemia   . Obesity   . OSA on CPAP   . Persistent atrial fibrillation (HCC) 11/07/14   Chads2vasc score of at least 4  . Type II diabetes mellitus (HCC)   . 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 Central jet. Aortic Valve appears to be mildly sclerotic. Trace aortic regurgitation.EF >55%    Past Surgical History:  Procedure Laterality Date  . APPENDECTOMY  1971  . CARDIOVERSION N/A 06/23/2013   Procedure: CARDIOVERSION;  Surgeon: Lennette Biharihomas A Kelly, MD;  [facility];  Service: Cardiovascular;  Laterality: N/A;  . CATARACT EXTRACTION W/ INTRAOCULAR LENS IMPLANT Bilateral   . CESAREAN SECTION  1971  . PLACEMENT OF BREAST IMPLANTS Bilateral 1985    Family History  Problem Relation Age of Onset  . Heart attack Mother   . Cardiomyopathy Mother   . Cancer - Lung Father   . Cancer - Other Maternal Grandmother   . Stroke Paternal Grandfather   . Diabetes Maternal Grandfather     Social History:  reports that she is a non-smoker but has been exposed to tobacco smoke. She has never used smokeless tobacco. She reports current alcohol use. She reports that she does not use drugs.  Allergies:  Allergies  Allergen Reactions  . Rosuvastatin Calcium Other (See Comments)    myalgias   . Statins Other (See Comments)    myalgia  . Amoxicillin Rash  . Atorvastatin Other (See Comments)    Back pain   . Ezetimibe-Simvastatin Other (See Comments)    Back pain   . Penicillins  Itching, Swelling and Rash    Did it involve swelling of the face/tongue/throat, SOB, or low BP? No Did it involve sudden or severe rash/hives, skin peeling, or any reaction on the inside of your mouth or nose? Yes Did you need to seek medical attention at a hospital or doctor's office? No When did it last happen?young adult If all above answers are "NO", may proceed with cephalosporin use.    Medications: I have reviewed the patient's current medications.  Results for orders placed or performed during the hospital encounter of 01/29/21 (from the past 48 hour(s))  Protime-INR     Status: Abnormal   Collection Time: 01/29/21  7:40 PM  Result Value Ref Range   Prothrombin Time 77.9 (H) 11.4 - 15.2 seconds   INR 9.7 (HH) 0.8 - 1.2    Comment: REPEATED TO VERIFY CRITICAL RESULT CALLED TO, READ BACK BY AND VERIFIED WITH: M.SANGALANG,RN @2152  @5 /24/2022 [email or web address] (NOTE) INR goal varies based on device and disease states. Performed at [facility], [address] [place], [place] [id number]   Comprehensive metabolic panel     Status: Abnormal   Collection Time: 01/29/21  7:40 PM  Result Value Ref Range   Sodium 134 (L) 135 - 145 mmol/L   Potassium 4.6 3.5 - 5.1 mmol/L   Chloride 103 98 - 111 mmol/L   CO2 23 22 - 32 mmol/L  Glucose, Bld 339 (H) 70 - 99 mg/dL    Comment: Glucose reference range applies only to samples taken after fasting for at least 8 hours.   BUN 15 8 - 23 mg/dL   Creatinine, Ser 1.61 0.44 - 1.00 mg/dL   Calcium 9.9 8.9 - 09.6 mg/dL   Total Protein 7.4 6.5 - 8.1 g/dL   Albumin 3.7 3.5 - 5.0 g/dL   AST 28 15 - 41 U/L   ALT 21 0 - 44 U/L   Alkaline Phosphatase 71 38 - 126 U/L   Total Bilirubin 0.9 0.3 - 1.2 mg/dL   GFR, Estimated >04 >54 mL/min    Comment: (NOTE) Calculated using the CKD-EPI Creatinine Equation (2021)    Anion gap 8 5 - 15    Comment: Performed at [facility], [address] [place], [place] [address]  CBC with Differential      Status: Abnormal   Collection Time: 01/29/21  7:40 PM  Result Value Ref Range   WBC 9.8 4.0 - 10.5 K/uL   RBC 5.22 (H) 3.87 - 5.11 MIL/uL   Hemoglobin 13.7 12.0 - 15.0 g/dL   HCT 91.4 78.2 - 95.6 %   MCV 82.8 80.0 - 100.0 fL   MCH 26.2 26.0 - 34.0 pg   MCHC 31.7 30.0 - 36.0 g/dL   RDW 21.3 08.6 - 57.8 %   Platelets 394 150 - 400 K/uL   nRBC 0.0 0.0 - 0.2 %   Neutrophils Relative % 73 %   Neutro Abs 7.1 1.7 - 7.7 K/uL   Lymphocytes Relative 18 %   Lymphs Abs 1.7 0.7 - 4.0 K/uL   Monocytes Relative 8 %   Monocytes Absolute 0.8 0.1 - 1.0 K/uL   Eosinophils Relative 0 %   Eosinophils Absolute 0.0 0.0 - 0.5 K/uL   Basophils Relative 0 %   Basophils Absolute 0.0 0.0 - 0.1 K/uL   Immature Granulocytes 1 %   Abs Immature Granulocytes 0.05 0.00 - 0.07 K/uL    Comment: Performed at [facility], [address] [place], [place] [address]  Type and screen [facility]     Status: None   Collection Time: 01/29/21  7:42 PM  Result Value Ref Range   ABO/RH(D) AB POS    Antibody Screen NEG    Sample Expiration      02/01/2021,2359 Performed at [facility], [address] [place], [place] [address]   Urinalysis, Routine w reflex microscopic Urine, Clean Catch     Status: Abnormal   Collection Time: 01/29/21 10:40 PM  Result Value Ref Range   Color, Urine AMBER (A) YELLOW    Comment: BIOCHEMICALS MAY BE AFFECTED BY COLOR   APPearance CLOUDY (A) CLEAR   Specific Gravity, Urine 1.025 1.005 - 1.030   pH 5.0 5.0 - 8.0   Glucose, UA >=500 (A) NEGATIVE mg/dL   Hgb urine dipstick LARGE (A) NEGATIVE   Bilirubin Urine NEGATIVE NEGATIVE   Ketones, ur NEGATIVE NEGATIVE mg/dL   Protein, ur 132 (A) NEGATIVE mg/dL   Nitrite NEGATIVE NEGATIVE   Leukocytes,Ua TRACE (A) NEGATIVE   RBC / HPF >50 (H) 0 - 5 RBC/hpf   WBC, UA 21-50 0 - 5 WBC/hpf   Bacteria, UA FEW (A) NONE SEEN   Squamous Epithelial / LPF 0-5 0 - 5   Mucus PRESENT     Comment: Performed at [facility], [address], [place], [place] [address]  SARS CORONAVIRUS 2 (TAT 6-24 HRS) Nasopharyngeal Urine, Clean Catch     Status: None   Collection Time: 01/29/21 11:09 PM   Specimen: Urine, Clean Catch; Nasopharyngeal  Result Value Ref Range   SARS Coronavirus 2 NEGATIVE NEGATIVE    Comment: (NOTE) SARS-CoV-2 target nucleic acids are NOT DETECTED.  The SARS-CoV-2 RNA is generally detectable in upper and lower respiratory specimens during the acute phase of infection. Negative results do not preclude SARS-CoV-2 infection, do not rule out co-infections with other pathogens, and should not be used as the sole basis for treatment or other patient management decisions. Negative results must be combined with clinical observations, patient history, and epidemiological information. The expected result is Negative.  Fact Sheet for Patients: HairSlick.no  Fact Sheet for Healthcare Providers: quierodirigir.com  This test is not yet approved or cleared by the [place] FDA and  has been authorized for detection and/or diagnosis of SARS-CoV-2 by FDA under an Emergency Use Authorization (EUA). This EUA will remain  in effect (meaning this test can be used) for the duration of the COVID-19 declaration under Se ction 564(b)(1) of the Act, 21 U.S.C. section 360bbb-3(b)(1), unless the authorization is terminated or revoked sooner.  Performed at [facility], [address] [place], [place] [address]   Protime-INR     Status: Abnormal   Collection Time: 01/30/21  2:48 AM  Result Value Ref Range   Prothrombin Time 40.3 (H) 11.4 - 15.2 seconds   INR 4.2 (HH) 0.8 - 1.2    Comment: REPEATED TO VERIFY CRITICAL RESULT CALLED TO, READ BACK BY AND VERIFIED WITH: L DANIELS,RN 01/30/2021 0432 WILDERK (NOTE) INR goal varies based on device and disease states. Performed at [facility], [address], [place],  [place] [address]   Basic metabolic panel     Status: Abnormal   Collection Time: 01/30/21  3:30 AM  Result Value Ref Range   Sodium 134 (L) 135 - 145 mmol/L   Potassium 4.4 3.5 - 5.1 mmol/L   Chloride 102 98 - 111 mmol/L   CO2 22 22 - 32 mmol/L   Glucose, Bld 266 (H) 70 - 99 mg/dL    Comment: Glucose reference range applies only to samples taken after fasting for at least 8 hours.   BUN 15 8 - 23 mg/dL   Creatinine, Ser 2.84 0.44 - 1.00 mg/dL   Calcium 9.9 8.9 - 13.2 mg/dL   GFR, Estimated >44 >01 mL/min    Comment: (NOTE) Calculated using the CKD-EPI Creatinine Equation (2021)    Anion gap 10 5 - 15    Comment: Performed at [facility], [address] [place], [place] [address]  CBC     Status: None   Collection Time: 01/30/21  3:30 AM  Result Value Ref Range   WBC 9.6 4.0 - 10.5 K/uL   RBC 4.86 3.87 - 5.11 MIL/uL   Hemoglobin 13.1 12.0 - 15.0 g/dL   HCT 36.6 44.0 - 34.7 %   MCV 84.4 80.0 - 100.0 fL   MCH 27.0 26.0 - 34.0 pg   MCHC 32.0 30.0 - 36.0 g/dL   RDW 42.5 95.6 - 38.7 %   Platelets 335 150 - 400 K/uL   nRBC 0.0 0.0 - 0.2 %    Comment: Performed at [facility], [address], [place], [place] [address]  Culture, Urine     Status: Abnormal   Collection Time: 01/30/21  4:37 AM  Specimen: Urine, Random  Result Value Ref Range   Specimen Description URINE, RANDOM    Special Requests      NONE Performed at [facility], [address] [place], [place] [address]    Culture MULTIPLE SPECIES PRESENT, SUGGEST RECOLLECTION (A)    Report Status 01/31/2021 FINAL   Protime-INR     Status: Abnormal   Collection Time: 01/30/21 10:29 AM  Result Value Ref Range   Prothrombin Time 24.9 (H) 11.4 - 15.2 seconds   INR 2.3 (H) 0.8 - 1.2    Comment: (NOTE) INR goal varies based on device and disease states. Performed at [facility], [address] [place], [place] [address]   Glucose, capillary     Status: Abnormal   Collection Time: 01/30/21 12:26 PM   Result Value Ref Range   Glucose-Capillary 356 (H) 70 - 99 mg/dL    Comment: Glucose reference range applies only to samples taken after fasting for at least 8 hours.  Glucose, capillary     Status: Abnormal   Collection Time: 01/30/21  4:59 PM  Result Value Ref Range   Glucose-Capillary 205 (H) 70 - 99 mg/dL    Comment: Glucose reference range applies only to samples taken after fasting for at least 8 hours.  Glucose, capillary     Status: Abnormal   Collection Time: 01/30/21  9:15 PM  Result Value Ref Range   Glucose-Capillary 247 (H) 70 - 99 mg/dL    Comment: Glucose reference range applies only to samples taken after fasting for at least 8 hours.  Protime-INR     Status: Abnormal   Collection Time: 01/31/21  1:23 AM  Result Value Ref Range   Prothrombin Time 23.3 (H) 11.4 - 15.2 seconds   INR 2.1 (H) 0.8 - 1.2    Comment: (NOTE) INR goal varies based on device and disease states. Performed at [facility], [address], [place], [place] [address]   CBC     Status: None   Collection Time: 01/31/21  1:23 AM  Result Value Ref Range   WBC 8.4 4.0 - 10.5 K/uL   RBC 4.85 3.87 - 5.11 MIL/uL   Hemoglobin 13.0 12.0 - 15.0 g/dL   HCT 66.5 99.3 - 57.0 %   MCV 81.0 80.0 - 100.0 fL   MCH 26.8 26.0 - 34.0 pg   MCHC 33.1 30.0 - 36.0 g/dL   RDW 17.7 93.9 - 03.0 %   Platelets 329 150 - 400 K/uL   nRBC 0.0 0.0 - 0.2 %    Comment: Performed at [facility], [address], [place], [place] [address]  Glucose, capillary     Status: Abnormal   Collection Time: 01/31/21 11:01 AM  Result Value Ref Range   Glucose-Capillary 286 (H) 70 - 99 mg/dL    Comment: Glucose reference range applies only to samples taken after fasting for at least 8 hours.  Glucose, capillary     Status: Abnormal   Collection Time: 01/31/21 11:56 AM  Result Value Ref Range   Glucose-Capillary 273 (H) 70 - 99 mg/dL    Comment: Glucose reference range applies only to samples taken after fasting for at  least 8 hours.    CT HEAD WO CONTRAST  Result Date: 01/29/2021 CLINICAL DATA:  Headache, intracranial hemorrhage suspected. EXAM: CT HEAD WITHOUT CONTRAST TECHNIQUE: Contiguous axial images were obtained from the base of the skull through the vertex without intravenous contrast. COMPARISON:  CT  head 01/13/2021 FINDINGS: Brain: Cerebral ventricle sizes are concordant with the degree of cerebral volume loss. Patchy and confluent areas of decreased attenuation are noted throughout the deep and periventricular white matter of the cerebral hemispheres bilaterally, compatible with chronic microvascular ischemic disease. No evidence of large-territorial acute infarction. No parenchymal hemorrhage. No mass lesion. No extra-axial collection. No mass effect or midline shift. No hydrocephalus. Basilar cisterns are patent. Vascular: No hyperdense vessel. Skull: No acute fracture or focal lesion. Sinuses/Orbits: Paranasal sinuses and mastoid air cells are clear. Bilateral lens thinning. Otherwise the orbits are unremarkable. Other: Interval development of two rounded 0.7 cm soft tissue densities in the region of the prior scalp hematoma (2:56). IMPRESSION: 1. No acute intracranial abnormality. 2. Interval development of two rounded 0.7 cm soft tissue densities in the region of the prior scalp hematoma. Finding likely represents residual of hematoma; however, consider correlation with physical exam for underlying lesion. Electronically Signed   By: Tish Frederickson M.D.   On: 01/29/2021 23:27    Review of Systems  HENT: Positive for nosebleeds.   All other systems reviewed and are negative.  Blood pressure (!) 158/95, pulse 82, temperature 97.6 F (36.4 C), temperature source Oral, resp. rate 19, height 5\' 5"  (1.651 m), weight 105 kg, SpO2 95 %. Physical Exam Constitutional:      Appearance: Normal appearance.  HENT:     Head: Normocephalic and atraumatic.     Right Ear: External ear normal.     Left Ear:  External ear normal.     Nose:     Comments: Right anterior septum with active bleeding.    Mouth/Throat:     Mouth: Mucous membranes are moist.     Pharynx: Oropharynx is clear.  Eyes:     Extraocular Movements: Extraocular movements intact.     Conjunctiva/sclera: Conjunctivae normal.     Pupils: Pupils are equal, round, and reactive to light.  Cardiovascular:     Rate and Rhythm: Normal rate.  Pulmonary:     Effort: Pulmonary effort is normal.  Musculoskeletal:     Cervical back: Normal range of motion.  Skin:    General: Skin is warm and dry.  Neurological:     General: No focal deficit present.     Mental Status: She is alert and oriented to person, place, and time.  Psychiatric:        Mood and Affect: Mood normal.        Behavior: Behavior normal.        Thought Content: Thought content normal.        Judgment: Judgment normal.     Assessment/Plan: Right epistaxis, anti-coagulation  I discussed her case with the hospitalist requesting that anti-coagulation be fully reversed.  Her bleeding site was identified on her right anterior septum and cauterized chemically.  See procedure note.  She was instructed to avoid blowing or picking and use saline spray frequently for the next two weeks.  She can resume anti-coagulation in two days if not bleeding.  01/31/2021, 1:45 PM

## 2021-01-31 NOTE — Progress Notes (Signed)
ANTICOAGULATION CONSULT NOTE - Initial Consult  Pharmacy Consult for Coumadin Indication: atrial fibrillation  Allergies  Allergen Reactions  . Rosuvastatin Calcium Other (See Comments)    myalgias   . Statins Other (See Comments)    myalgia  . Amoxicillin Rash  . Atorvastatin Other (See Comments)    Back pain   . Ezetimibe-Simvastatin Other (See Comments)    Back pain   . Penicillins Itching, Swelling and Rash    Did it involve swelling of the face/tongue/throat, SOB, or low BP? No Did it involve sudden or severe rash/hives, skin peeling, or any reaction on the inside of your mouth or nose? Yes Did you need to seek medical attention at a hospital or doctor's office? No When did it last happen?young adult If all above answers are "NO", may proceed with cephalosporin use.    Patient Measurements: Height: 5\' 5"  (165.1 cm) Weight: 105 kg (231 lb 7.7 oz) IBW/kg (Calculated) : 57  Vital Signs: Temp: 97.6 F (36.4 C) (05/26 0646) Temp Source: Oral (05/26 0646) BP: 158/95 (05/26 0646) Pulse Rate: 82 (05/26 0646)  Labs: Recent Labs    01/29/21 1940 01/30/21 0248 01/30/21 0330 01/30/21 1029 01/31/21 0123  HGB 13.7  --  13.1  --  13.0  HCT 43.2  --  41.0  --  39.3  PLT 394  --  335  --  329  LABPROT 77.9* 40.3*  --  24.9* 23.3*  INR 9.7* 4.2*  --  2.3* 2.1*  CREATININE 0.89  --  0.76  --   --     Estimated Creatinine Clearance: 69.7 mL/min (by C-G formula based on SCr of 0.76 mg/dL).   Medical History: Past Medical History:  Diagnosis Date  . Anxiety   . Chronic insomnia   . Hyperlipidemia   . Hypertension   . Hypertriglyceridemia   . Obesity   . OSA on CPAP   . Persistent atrial fibrillation (HCC) 11/07/14   Chads2vasc score of at least 4  . Type II diabetes mellitus (HCC)   . 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 Central jet. Aortic Valve appears to be mildly sclerotic. Trace aortic regurgitation.EF  >55%     Assessment: 80 y.o. female admitted with INR of 9.7 with ear bleeding and hematuria, on warfarin for persistent Afib. Vitamin K IV 5mg  given. Pharmacy consulted for warfarin dosing.    INR down to 2.1. H/H, plt stable but with severe epistaxis 5/26. Will hold warfarin. Another IV Vitamin K 5mg  ordered.   Per patient, has been eating less since starting semaglutide. Patient unable to quantify vegetable intake but states is a picky eater but hast been eating more green beans lately. Denied med changes other than Ozempic, which was started 2 months ago.    Warfarin PTA: 5mg  daily (confirmed with patient, followed by PCP at [facility])   Goal of Therapy:  INR 2-3 Monitor platelets by anticoagulation protocol: Yes   Plan:  Hold Warfarin  x1 Monitor daily INR, CBC/plt Monitor for signs/symptoms of bleeding  Asked patient to make PCP appointment for INR check next week, she stated she would   6/26, PharmD, BCPS, [facility] Clinical Pharmacist  Please check AMION for all [facility] Pharmacy phone numbers After 10:00 PM, call Main Pharmacy [phone]

## 2021-01-31 NOTE — Progress Notes (Signed)
[facility]  PROGRESS NOTE  Markeita Alicia WGN:[id number] DOB: 23-Jul-1941 DOA: 01/29/2021 PCP: Angelica Chessman, MD   Brief HPI:    80 year old female with history of atrial fibrillation, hypertension, sleep apnea, diabetes mellitus type 2 was advised to come to the ED after patient's INR was found to be elevated.  Patient came to ED after fall on May 8 at that time CT head was unremarkable.  INR was around 1.6.  Patient noticed bleeding from right ear yesterday which has stopped.  Also noted frank hematuria.  And was found to be elevated 9.7.  She was given 1 dose of 5 mg IV vitamin K.  Placed under observation.    Subjective   This morning patient developed epistaxis from right nostril.  INR was in therapeutic range at 2.1.  She received 5 mg of vitamin K IV yesterday.   Assessment/Plan:     1. Epistaxis-patient developed epistaxis this morning with profuse bleeding from right nostril.  Oxymetazoline spray was ordered.  INR was in therapeutic range at 2.1.  Called and discussed with ENT, Dr. Jenne Pane.  He will see patient in consultation.  He recommended to give more vitamin K to bring INR down to 1.  Will give additional dose of vitamin K 5 mg IV x1.  Will hold Coumadin for now. 2. Coagulopathy-secondary to anticoagulation with Coumadin.  Patient developed hematuria, right ear bleeding.  INR was 9.7 on presentation.  She has received vitamin K 5 mg IV x1.  INR was 2.1 this morning.  Will give additional dose of vitamin K 5 mg IV x1 as she developed epistaxis as above. 3. ?  UTI-UA was abnormal, showed 21-50 WBCs per high-power field.  She was empirically started on IV ceftriaxone.  Urine culture grew multiple species.  Will discontinue ceftriaxone.  4. Diabetes mellitus type 2-CBGs elevated, will start Lantus 20 units subcu daily.  Continue sliding scale insulin with NovoLog. 5. Persistent atrial fibrillation-heart rate is controlled, continue Cardizem, metoprolol.  Coumadin is on  hold as above. 6. Hypertension-blood pressure is elevated.  Patient is on losartan, Cardizem, metoprolol.  We will add hydralazine 25 mg p.o. every 6 hours as needed for BP more than 160/100.     Scheduled medications:   . diltiazem  360 mg Oral Daily  . ezetimibe  10 mg Oral QHS  . insulin aspart  0-9 Units Subcutaneous TID WC  . insulin glargine  20 Units Subcutaneous QHS  . irbesartan  150 mg Oral Daily  . magnesium oxide  400 mg Oral Daily  . melatonin  3 mg Oral QHS  . metoprolol succinate  100 mg Oral Daily  . metoprolol succinate  50 mg Oral QHS  . sertraline  100 mg Oral q morning  . sodium chloride  2 spray Each Nare Q2H while awake  . Warfarin - Pharmacist Dosing Inpatient   Does not apply q1600         Data Reviewed:   CBG:  Recent Labs  Lab 01/30/21 1226 01/30/21 1659 01/30/21 2115 01/31/21 1101 01/31/21 1156  GLUCAP 356* 205* 247* 286* 273*    SpO2: 96 %    Vitals:   01/30/21 2108 01/30/21 2109 01/31/21 0646 01/31/21 1420  BP: (!) 176/109 (!) 176/109 (!) 158/95 (!) 173/107  Pulse: (!) 108 (!) 108 82 (!) 107  Resp: 20 20 19 18   Temp: 98.4 F (36.9 C) 98.4 F (36.9 C) 97.6 F (36.4 C) 98.1 F (36.7 C)  TempSrc: Oral Oral  Oral Oral  SpO2: 95% 95% 95% 96%  Weight:      Height:         Intake/Output Summary (Last 24 hours) at 01/31/2021 1648 Last data filed at 01/31/2021 0900 Gross per 24 hour  Intake 395.82 ml  Output --  Net 395.82 ml    05/24 1901 - 05/26 0700 In: 95.8  Out: 1 [Urine:1]  Filed Weights   01/29/21 1930  Weight: 105 kg    CBC:  Recent Labs  Lab 01/29/21 1940 01/30/21 0330 01/31/21 0123  WBC 9.8 9.6 8.4  HGB 13.7 13.1 13.0  HCT 43.2 41.0 39.3  PLT 394 335 329  MCV 82.8 84.4 81.0  MCH 26.2 27.0 26.8  MCHC 31.7 32.0 33.1  RDW 15.4 15.2 15.2  LYMPHSABS 1.7  --   --   MONOABS 0.8  --   --   EOSABS 0.0  --   --   BASOSABS 0.0  --   --     Complete metabolic panel:  Recent Labs  Lab 01/29/21 1940  01/30/21 0248 01/30/21 0330 01/30/21 1029 01/31/21 0123  NA 134*  --  134*  --   --   K 4.6  --  4.4  --   --   CL 103  --  102  --   --   CO2 23  --  22  --   --   GLUCOSE 339*  --  266*  --   --   BUN 15  --  15  --   --   CREATININE 0.89  --  0.76  --   --   CALCIUM 9.9  --  9.9  --   --   AST 28  --   --   --   --   ALT 21  --   --   --   --   ALKPHOS 71  --   --   --   --   BILITOT 0.9  --   --   --   --   ALBUMIN 3.7  --   --   --   --   INR 9.7* 4.2*  --  2.3* 2.1*    No results for input(s): LIPASE, AMYLASE in the last 168 hours.  Recent Labs  Lab 01/29/21 2309  SARSCOV2NAA NEGATIVE    ------------------------------------------------------------------------------------------------------------------ No results for input(s): CHOL, HDL, LDLCALC, TRIG, CHOLHDL, LDLDIRECT in the last 72 hours.  Lab Results  Component Value Date   HGBA1C 8.5 (H) 07/25/2019   ------------------------------------------------------------------------------------------------------------------ No results for input(s): TSH, T4TOTAL, T3FREE, THYROIDAB in the last 72 hours.  Invalid input(s): FREET3 ------------------------------------------------------------------------------------------------------------------ No results for input(s): VITAMINB12, FOLATE, FERRITIN, TIBC, IRON, RETICCTPCT in the last 72 hours.  Coagulation profile Recent Labs  Lab 01/29/21 1940 01/30/21 0248 01/30/21 1029 01/31/21 0123  INR 9.7* 4.2* 2.3* 2.1*   No results for input(s): DDIMER in the last 72 hours.  Cardiac Enzymes No results for input(s): CKTOTAL, CKMB, CKMBINDEX, TROPONINI in the last 168 hours.  ------------------------------------------------------------------------------------------------------------------ No results found for: BNP   Antibiotics: Anti-infectives (From admission, onward)   Start     Dose/Rate Route Frequency Ordered Stop   01/30/21 0445  cefTRIAXone (ROCEPHIN) 1 g in sodium  chloride 0.9 % 100 mL IVPB        1 g 200 mL/hr over 30 Minutes Intravenous Daily 01/30/21 0437         Radiology Reports  CT HEAD WO CONTRAST  Result Date:  01/29/2021 CLINICAL DATA:  Headache, intracranial hemorrhage suspected. EXAM: CT HEAD WITHOUT CONTRAST TECHNIQUE: Contiguous axial images were obtained from the base of the skull through the vertex without intravenous contrast. COMPARISON:  CT head 01/13/2021 FINDINGS: Brain: Cerebral ventricle sizes are concordant with the degree of cerebral volume loss. Patchy and confluent areas of decreased attenuation are noted throughout the deep and periventricular white matter of the cerebral hemispheres bilaterally, compatible with chronic microvascular ischemic disease. No evidence of large-territorial acute infarction. No parenchymal hemorrhage. No mass lesion. No extra-axial collection. No mass effect or midline shift. No hydrocephalus. Basilar cisterns are patent. Vascular: No hyperdense vessel. Skull: No acute fracture or focal lesion. Sinuses/Orbits: Paranasal sinuses and mastoid air cells are clear. Bilateral lens thinning. Otherwise the orbits are unremarkable. Other: Interval development of two rounded 0.7 cm soft tissue densities in the region of the prior scalp hematoma (2:56). IMPRESSION: 1. No acute intracranial abnormality. 2. Interval development of two rounded 0.7 cm soft tissue densities in the region of the prior scalp hematoma. Finding likely represents residual of hematoma; however, consider correlation with physical exam for underlying lesion. Electronically Signed   By: Tish Frederickson M.D.   On: 01/29/2021 23:27      DVT prophylaxis: Warfarin  Code Status: Full code  Family Communication: No family at bedside   Consultants:    Procedures:      Objective    Physical Examination:   General-appears in no acute distress Heart-S1-S2, regular, no murmur auscultated HEENT-bleeding noted from right  nostril Lungs-clear to auscultation bilaterally, no wheezing or crackles auscultated Abdomen-soft, nontender, no organomegaly Extremities-no edema in the lower extremities Neuro-alert, oriented x3, no focal deficit noted  Status is: Inpatient  Dispo: The patient is from: Home              Anticipated d/c is to: Home              Anticipated d/c date is: 02/01/2021              Patient currently not stable for discharge  Barrier to discharge-ongoing management for epistaxis, coagulopathy  COVID-19 Labs  No results for input(s): DDIMER, FERRITIN, LDH, CRP in the last 72 hours.  Lab Results  Component Value Date   SARSCOV2NAA NEGATIVE 01/29/2021   SARSCOV2NAA NEGATIVE 07/26/2019    Microbiology  Recent Results (from the past 240 hour(s))  SARS CORONAVIRUS 2 (TAT 6-24 HRS) Nasopharyngeal Urine, Clean Catch     Status: None   Collection Time: 01/29/21 11:09 PM   Specimen: Urine, Clean Catch; Nasopharyngeal  Result Value Ref Range Status   SARS Coronavirus 2 NEGATIVE NEGATIVE Final    Comment: (NOTE) SARS-CoV-2 target nucleic acids are NOT DETECTED.  The SARS-CoV-2 RNA is generally detectable in upper and lower respiratory specimens during the acute phase of infection. Negative results do not preclude SARS-CoV-2 infection, do not rule out co-infections with other pathogens, and should not be used as the sole basis for treatment or other patient management decisions. Negative results must be combined with clinical observations, patient history, and epidemiological information. The expected result is Negative.  Fact Sheet for Patients: HairSlick.no  Fact Sheet for Healthcare Providers: quierodirigir.com  This test is not yet approved or cleared by the [place] FDA and  has been authorized for detection and/or diagnosis of SARS-CoV-2 by FDA under an Emergency Use Authorization (EUA). This EUA will remain  in effect  (meaning this test can be used) for the duration of the COVID-19 declaration  under Se ction 564(b)(1) of the Act, 21 U.S.C. section 360bbb-3(b)(1), unless the authorization is terminated or revoked sooner.  Performed at [facility], [address], [place], [place] [address]   Culture, Urine     Status: Abnormal   Collection Time: 01/30/21  4:37 AM   Specimen: Urine, Random  Result Value Ref Range Status   Specimen Description URINE, RANDOM  Final   Special Requests   Final    NONE Performed at [facility], [address] [place], [place] [address]    Culture MULTIPLE SPECIES PRESENT, SUGGEST RECOLLECTION (A)  Final   Report Status 01/31/2021 FINAL  Final             Meredeth Ide   [facility] If 7PM-7AM, please contact night-coverage at [email or web address], Office  [phone]   01/31/2021, 4:48 PM  LOS: 1 day

## 2021-02-01 ENCOUNTER — Other Ambulatory Visit (HOSPITAL_COMMUNITY): Payer: Self-pay

## 2021-02-01 LAB — CBC
HCT: 38.3 % (ref 36.0–46.0)
Hemoglobin: 12.5 g/dL (ref 12.0–15.0)
MCH: 27 pg (ref 26.0–34.0)
MCHC: 32.6 g/dL (ref 30.0–36.0)
MCV: 82.7 fL (ref 80.0–100.0)
Platelets: 278 10*3/uL (ref 150–400)
RBC: 4.63 MIL/uL (ref 3.87–5.11)
RDW: 15.1 % (ref 11.5–15.5)
WBC: 8.4 10*3/uL (ref 4.0–10.5)
nRBC: 0 % (ref 0.0–0.2)

## 2021-02-01 LAB — PROTIME-INR
INR: 1.6 — ABNORMAL HIGH (ref 0.8–1.2)
Prothrombin Time: 19 seconds — ABNORMAL HIGH (ref 11.4–15.2)

## 2021-02-01 LAB — GLUCOSE, CAPILLARY
Glucose-Capillary: 190 mg/dL — ABNORMAL HIGH (ref 70–99)
Glucose-Capillary: 312 mg/dL — ABNORMAL HIGH (ref 70–99)

## 2021-02-01 NOTE — Progress Notes (Signed)
   02/01/21 1331  AVS Discharge Documentation  AVS Discharge Instructions Including Medications Provided to patient/caregiver  Name of Person Receiving AVS Discharge Instructions Including Medications Amy Wright  Name of Clinician That Reviewed AVS Discharge Instructions Including Medications Teena Irani

## 2021-02-01 NOTE — TOC Benefit Eligibility Note (Signed)
Patient Product/process development scientist completed.    The patient is currently admitted and upon discharge could be taking Eliquis 5 mg.  The current 30 day co-pay is, $141.49  Due to being in Coverage Gap [facility]).   The patient is insured through  [employer] Part D    Roland Earl, CPhT Pharmacy Patient Advocate Specialist [facility] Antimicrobial Stewardship Team Direct Number: [phone]  Fax: [phone]

## 2021-02-01 NOTE — Care Management Important Message (Signed)
Important Message  Patient Details  Name: Amy Wright MRN: [id number] Date of Birth: May 06, 1941   Medicare Important Message Given:  Yes     Mirel Hundal [place] 02/01/2021, 1:05 PM

## 2021-02-01 NOTE — Discharge Summary (Signed)
Physician Discharge Summary  Jacqualine MauJeanette Marker [id number] DOB: 02-Sep-1941 DOA: 01/29/2021  PCP: Angelica ChessmanAguiar, Rafaela M, MD  Admit date: 01/29/2021 Discharge date: 02/01/2021  Time spent: 50* minutes  Recommendations for Outpatient Follow-up:  1. Follow-up PCP in 1 week 2. Stop Coumadin for 2 days, start taking back on 02/03/2021 3. .  Choline fenofibrate as it interacts with Coumadin. 4. Keep on using nasal saline spray as needed for the next 2 weeks   Discharge Diagnoses:  Principal Problem:   Coagulopathy (HCC) Active Problems:   Persistent atrial fibrillation (HCC)   DM2 (diabetes mellitus, type 2) (HCC)   Discharge Condition: Stable  Diet recommendation: Heart healthy diet  Filed Weights   01/29/21 1930  Weight: 105 kg    History of present illness:  80 year old female with history of atrial fibrillation, hypertension, sleep apnea, diabetes mellitus type 2 was advised to come to the ED after patient's INR was found to be elevated. Patient came to ED after fall on May 8 at that time CT head was unremarkable. INR was around 1.6. Patient noticed bleeding from right ear yesterday which has stopped. Also noted frank hematuria. And was found to be elevated 9.7. She was given 1 dose of 5 mg IV vitamin K. Placed under observation.  Hospital Course:   1. Epistaxis-patient developed epistaxis  with profuse bleeding from right nostril.  Oxymetazoline spray was ordered.  INR was in therapeutic range at 2.1.    She was seen by ENT, underwent simple anterior cautery for right epistaxis.  Bleeding has stopped.  ENT recommends to use nasal spray as needed for the next 2 weeks.  Not to pick on the nose.  Discussed with patient.  2.  Coagulopathy-secondary to anticoagulation with Coumadin.  Patient developed hematuria, right ear bleeding.  INR was 9.7 on presentation.  She has received vitamin K 5 mg IV x1.  INR was 2.1 yesterday.  She was given additional vitamin K 5 mg IV x1.  This morning  INR is 1.6. 3. ?  UTI-UA was abnormal, showed 21-50 WBCs per high-power field.  She was empirically started on IV ceftriaxone.  Urine culture grew multiple species.    Ceftriaxone was discontinued. 4. Diabetes mellitus type 2-continue home regimen. 5. Persistent atrial fibrillation-heart rate is controlled, continue Cardizem, metoprolol.  Coumadin is on hold for 2 more days, can start taking back on 02/03/2021.  Patient will keep on checking INR at home.  Also advised to stop taking choline fenofibrate which interacts with Coumadin and can elevate the level of INR 6. Hypertension-continue home regimen   Procedures:    Consultations: ENT  Discharge Exam: Vitals:   01/31/21 2116 02/01/21 0316  BP: (!) 171/95 (!) 149/79  Pulse: 89 100  Resp: 18 20  Temp: 98 F (36.7 C) 97.7 F (36.5 C)  SpO2: 94% 93%    General: Appears in no acute distress Cardiovascular: S1-S2, regular Respiratory: Clear to auscultation bilaterally  Discharge Instructions   Discharge Instructions    Diet - low sodium heart healthy   Complete by: As directed    Discharge instructions   Complete by: As directed    Hold warfarin for 2 days. Start taking it on Sunday May 29th, 2022   Increase activity slowly   Complete by: As directed      Allergies as of 02/01/2021      Reactions   Rosuvastatin Calcium Other (See Comments)   myalgias   Statins Other (See Comments)   myalgia   Amoxicillin  Rash   Atorvastatin Other (See Comments)   Back pain   Ezetimibe-simvastatin Other (See Comments)   Back pain   Penicillins Itching, Swelling, Rash   Did it involve swelling of the face/tongue/throat, SOB, or low BP? No Did it involve sudden or severe rash/hives, skin peeling, or any reaction on the inside of your mouth or nose? Yes Did you need to seek medical attention at a hospital or doctor's office? No When did it last happen?young adult If all above answers are "NO", may proceed with cephalosporin  use.      Medication List    STOP taking these medications   Choline Fenofibrate 135 MG capsule     TAKE these medications   Accu-Chek Compact Plus test strip Generic drug: glucose blood CHECK BLOOD SUGAR TWICE DAILY OR AS DIRECTED   acetaminophen 500 MG tablet Commonly known as: TYLENOL Take 1,000 mg by mouth every 6 (six) hours as needed for headache or mild pain (pain).   cholecalciferol 25 MCG (1000 UNIT) tablet Commonly known as: VITAMIN D3 Take 1,000 Units by mouth daily.   diltiazem 360 MG 24 hr capsule Commonly known as: CARDIZEM CD TAKE 1 CAPSULE BY MOUTH EVERY MORNING. OVERDUE APPT WITH DR. ALLRED BEFORE ANYMORE REFILLS. What changed: See the new instructions.   ezetimibe 10 MG tablet Commonly known as: ZETIA Take 10 mg by mouth at bedtime.   folic acid 1 MG tablet Commonly known as: FOLVITE Take 1 mg by mouth every morning.   furosemide 40 MG tablet Commonly known as: LASIX Take 40 mg by mouth daily as needed for edema.   glipiZIDE 10 MG tablet Commonly known as: GLUCOTROL Take 10 mg by mouth every morning.   LORazepam 0.5 MG tablet Commonly known as: ATIVAN Take 0.5 mg by mouth daily as needed for anxiety (jitters).   magnesium oxide 400 MG tablet Commonly known as: MAG-OX Take 1 tablet (400 mg total) by mouth daily.   melatonin 3 MG Tabs tablet Take 3 mg by mouth at bedtime as needed (sleep).   metFORMIN 1000 MG tablet Commonly known as: GLUCOPHAGE Take 1,000 mg by mouth 2 (two) times daily with a meal.   metoprolol succinate 50 MG 24 hr tablet Commonly known as: TOPROL-XL TAKE 2 TABLETS IN THE MORNING. TAKE 1 TABLET AT NIGHT. PLEASE KEEP UPCOMING APPT IN APRIL 2022 BEFORE ANYMORE REFILLS. THANK YOU What changed: See the new instructions.   Myrbetriq 50 MG Tb24 tablet Generic drug: mirabegron ER Take 50 mg by mouth daily.   Ozempic (0.25 or 0.5 MG/DOSE) 2 MG/1.5ML Sopn Generic drug: Semaglutide(0.25 or 0.5MG /DOS) Inject 0.5 mg into the  skin once a week. Sunday/ Monday   potassium chloride 10 MEQ tablet Commonly known as: KLOR-CON TAKE 1 TABLET BY MOUTH EVERY DAY   sertraline 100 MG tablet Commonly known as: ZOLOFT Take 100 mg by mouth every morning.   sodium chloride 0.65 % nasal spray Commonly known as: OCEAN Place 1 spray into the nose as needed for congestion.   telmisartan 80 MG tablet Commonly known as: MICARDIS Take 80 mg by mouth daily.   vitamin B-12 1000 MCG tablet Commonly known as: CYANOCOBALAMIN Take 1,000 mcg by mouth every morning.   vitamin C 1000 MG tablet Take 2,000 mg by mouth daily.   VITAMIN E PO Take 1 capsule by mouth once a week.   warfarin 5 MG tablet Commonly known as: COUMADIN Take as directed. If you are unsure how to take this medication, talk to  your nurse or doctor. Original instructions: Take 1 tablet daily as directed, must schedule INR appt for further refills What changed:   how much to take  how to take this  when to take this   zolpidem 5 MG tablet Commonly known as: AMBIEN Take 5 mg by mouth at bedtime.      Allergies  Allergen Reactions  . Rosuvastatin Calcium Other (See Comments)    myalgias   . Statins Other (See Comments)    myalgia  . Amoxicillin Rash  . Atorvastatin Other (See Comments)    Back pain   . Ezetimibe-Simvastatin Other (See Comments)    Back pain   . Penicillins Itching, Swelling and Rash    Did it involve swelling of the face/tongue/throat, SOB, or low BP? No Did it involve sudden or severe rash/hives, skin peeling, or any reaction on the inside of your mouth or nose? Yes Did you need to seek medical attention at a hospital or doctor's office? No When did it last happen?young adult If all above answers are "NO", may proceed with cephalosporin use.      The results of significant diagnostics from this hospitalization (including imaging, microbiology, ancillary and laboratory) are listed below for reference.     Significant Diagnostic Studies: DG Chest 1 View  Result Date: 01/13/2021 CLINICAL DATA:  Fall with head and shoulder pain. EXAM: CHEST  1 VIEW COMPARISON:  Chest radiograph dated 06/20/2013. FINDINGS: The heart is borderline enlarged. Vascular calcifications are seen in the aortic arch. Both lungs are clear. Degenerative changes are seen in the spine. IMPRESSION: No active disease. Electronically Signed   By: Romona Curls M.D.   On: 01/13/2021 12:29   CT HEAD WO CONTRAST  Result Date: 01/29/2021 CLINICAL DATA:  Headache, intracranial hemorrhage suspected. EXAM: CT HEAD WITHOUT CONTRAST TECHNIQUE: Contiguous axial images were obtained from the base of the skull through the vertex without intravenous contrast. COMPARISON:  CT head 01/13/2021 FINDINGS: Brain: Cerebral ventricle sizes are concordant with the degree of cerebral volume loss. Patchy and confluent areas of decreased attenuation are noted throughout the deep and periventricular white matter of the cerebral hemispheres bilaterally, compatible with chronic microvascular ischemic disease. No evidence of large-territorial acute infarction. No parenchymal hemorrhage. No mass lesion. No extra-axial collection. No mass effect or midline shift. No hydrocephalus. Basilar cisterns are patent. Vascular: No hyperdense vessel. Skull: No acute fracture or focal lesion. Sinuses/Orbits: Paranasal sinuses and mastoid air cells are clear. Bilateral lens thinning. Otherwise the orbits are unremarkable. Other: Interval development of two rounded 0.7 cm soft tissue densities in the region of the prior scalp hematoma (2:56). IMPRESSION: 1. No acute intracranial abnormality. 2. Interval development of two rounded 0.7 cm soft tissue densities in the region of the prior scalp hematoma. Finding likely represents residual of hematoma; however, consider correlation with physical exam for underlying lesion. Electronically Signed   By: Tish Frederickson M.D.   On: 01/29/2021  23:27   CT Head Wo Contrast  Result Date: 01/13/2021 CLINICAL DATA:  Fall getting out of bed. Trauma to head. Patient is on Coumadin. EXAM: CT HEAD WITHOUT CONTRAST TECHNIQUE: Contiguous axial images were obtained from the base of the skull through the vertex without intravenous contrast. COMPARISON:  None. FINDINGS: Brain: Moderate atrophy and white matter disease is present bilaterally. No acute infarct, hemorrhage, or mass lesion is present. 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 No significant extraaxial fluid collection is present. Vascular: Atherosclerotic changes are present  in the cavernous internal carotid arteries. No hyperdense vessel is present. Skull: Calvarium is intact. No focal lytic or blastic lesions are present. Left parietal scalp hematoma is present without underlying fracture. Sinuses/Orbits: The paranasal sinuses and mastoid air cells are clear. Bilateral lens replacements are noted. Globes and orbits are otherwise unremarkable. IMPRESSION: 1. Left parietal scalp hematoma without underlying fracture. 2. No acute intracranial abnormality. 3. Moderate atrophy and white matter disease likely reflects the sequela of chronic microvascular ischemia. Electronically Signed   By: Marin Roberts M.D.   On: 01/13/2021 12:54   DG Shoulder Left  Result Date: 01/13/2021 CLINICAL DATA:  Fall with left shoulder pain. EXAM: LEFT SHOULDER - 2+ VIEW COMPARISON:  Chest radiograph dated 06/20/2013. FINDINGS: There is no evidence of fracture or dislocation. Mild degenerative changes are seen at the acromioclavicular and glenohumeral joints. Soft tissues are unremarkable. IMPRESSION: No acute osseous injury. Electronically Signed   By: Romona Curls M.D.   On: 01/13/2021 12:30    Microbiology: Recent Results (from the past 240 hour(s))  SARS CORONAVIRUS 2 (TAT 6-24 HRS) Nasopharyngeal Urine, Clean Catch     Status: None   Collection Time: 01/29/21 11:09 PM    Specimen: Urine, Clean Catch; Nasopharyngeal  Result Value Ref Range Status   SARS Coronavirus 2 NEGATIVE NEGATIVE Final    Comment: (NOTE) SARS-CoV-2 target nucleic acids are NOT DETECTED.  The SARS-CoV-2 RNA is generally detectable in upper and lower respiratory specimens during the acute phase of infection. Negative results do not preclude SARS-CoV-2 infection, do not rule out co-infections with other pathogens, and should not be used as the sole basis for treatment or other patient management decisions. Negative results must be combined with clinical observations, patient history, and epidemiological information. The expected result is Negative.  Fact Sheet for Patients: HairSlick.no  Fact Sheet for Healthcare Providers: quierodirigir.com  This test is not yet approved or cleared by the [place] FDA and  has been authorized for detection and/or diagnosis of SARS-CoV-2 by FDA under an Emergency Use Authorization (EUA). This EUA will remain  in effect (meaning this test can be used) for the duration of the COVID-19 declaration under Se ction 564(b)(1) of the Act, 21 U.S.C. section 360bbb-3(b)(1), unless the authorization is terminated or revoked sooner.  Performed at [facility], [address], [place], [place] [address]   Culture, Urine     Status: Abnormal   Collection Time: 01/30/21  4:37 AM   Specimen: Urine, Random  Result Value Ref Range Status   Specimen Description URINE, RANDOM  Final   Special Requests   Final    NONE Performed at [facility], [address], [place], [place] [address]    Culture MULTIPLE SPECIES PRESENT, SUGGEST RECOLLECTION (A)  Final   Report Status 01/31/2021 FINAL  Final     Labs: Basic Metabolic Panel: Recent Labs  Lab 01/29/21 1940 01/30/21 0330  NA 134* 134*  K 4.6 4.4  CL 103 102  CO2 23 22  GLUCOSE 339* 266*  BUN 15 15  CREATININE 0.89 0.76   CALCIUM 9.9 9.9   Liver Function Tests: Recent Labs  Lab 01/29/21 1940  AST 28  ALT 21  ALKPHOS 71  BILITOT 0.9  PROT 7.4  ALBUMIN 3.7   No results for input(s): LIPASE, AMYLASE in the last 168 hours. No results for input(s): AMMONIA in the last 168 hours. CBC: Recent Labs  Lab 01/29/21 1940 01/30/21 0330 01/31/21 0123 02/01/21 0018  WBC 9.8 9.6 8.4 8.4  NEUTROABS 7.1  --   --   --   HGB 13.7 13.1 13.0 12.5  HCT 43.2 41.0 39.3 38.3  MCV 82.8 84.4 81.0 82.7  PLT 394 335 329 278   Cardiac Enzymes: No results for input(s): CKTOTAL, CKMB, CKMBINDEX, TROPONINI in the last 168 hours. BNP: BNP (last 3 results) No results for input(s): BNP in the last 8760 hours.  ProBNP (last 3 results) No results for input(s): PROBNP in the last 8760 hours.  CBG: Recent Labs  Lab 01/31/21 1101 01/31/21 1156 01/31/21 1740 01/31/21 2119 02/01/21 0739  GLUCAP 286* 273* 252* 272* 190*       Signed:  Meredeth Ide MD.  [facility] 02/01/2021, 9:33 AM

## 2021-02-01 NOTE — Progress Notes (Signed)
ANTICOAGULATION CONSULT NOTE - Initial Consult  Pharmacy Consult for Coumadin Indication: atrial fibrillation  Allergies  Allergen Reactions  . Rosuvastatin Calcium Other (See Comments)    myalgias   . Statins Other (See Comments)    myalgia  . Amoxicillin Rash  . Atorvastatin Other (See Comments)    Back pain   . Ezetimibe-Simvastatin Other (See Comments)    Back pain   . Penicillins Itching, Swelling and Rash    Did it involve swelling of the face/tongue/throat, SOB, or low BP? No Did it involve sudden or severe rash/hives, skin peeling, or any reaction on the inside of your mouth or nose? Yes Did you need to seek medical attention at a hospital or doctor's office? No When did it last happen?young adult If all above answers are "NO", may proceed with cephalosporin use.    Patient Measurements: Height: 5\' 5"  (165.1 cm) Weight: 105 kg (231 lb 7.7 oz) IBW/kg (Calculated) : 57  Vital Signs: Temp: 97.7 F (36.5 C) (05/27 0316) Temp Source: Oral (05/27 0316) BP: 149/79 (05/27 0316) Pulse Rate: 100 (05/27 0316)  Labs: Recent Labs    01/29/21 1940 01/30/21 0248 01/30/21 0330 01/30/21 1029 01/31/21 0123 02/01/21 0018  HGB 13.7  --  13.1  --  13.0 12.5  HCT 43.2  --  41.0  --  39.3 38.3  PLT 394  --  335  --  329 278  LABPROT 77.9*   < >  --  24.9* 23.3* 19.0*  INR 9.7*   < >  --  2.3* 2.1* 1.6*  CREATININE 0.89  --  0.76  --   --   --    < > = values in this interval not displayed.    Estimated Creatinine Clearance: 69.7 mL/min (by C-G formula based on SCr of 0.76 mg/dL).   Medical History: Past Medical History:  Diagnosis Date  . Anxiety   . Chronic insomnia   . Hyperlipidemia   . Hypertension   . Hypertriglyceridemia   . Obesity   . OSA on CPAP   . Persistent atrial fibrillation (HCC) 11/07/14   Chads2vasc score of at least 4  . Type II diabetes mellitus (HCC)   . 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 Central jet. Aortic Valve appears to be mildly sclerotic. Trace aortic regurgitation.EF >55%     Assessment: 80 y.o. female admitted with INR of 9.7 with ear bleeding and hematuria, on warfarin for persistent Afib. Vitamin K IV 5mg  given. Pharmacy consulted for warfarin dosing.    Severe epistaxis on 5/26 requiring ENT intervention to stop bleeding. INR now down to 1.6 after another IV Vitamin K 5mg  (received 10mg  IV total this admit). H/H, plt with slight downtrend. Hold anticoagulation until ~5/28 if no further bleeding per ENT.   Per patient, has been eating less since starting semaglutide. Patient unable to quantify vegetable intake but states is a picky eater but hast been eating more green beans lately. Denied med changes other than Ozempic, which was started 2 months ago.    Warfarin PTA: 5mg  daily (confirmed with patient, followed by PCP at [facility])   Goal of Therapy:  INR 2-3 Monitor platelets by anticoagulation protocol: Yes   Plan:  Hold Warfarin  x1 Monitor daily INR, CBC/plt Monitor for signs/symptoms of bleeding and f/u warfarin restart  Asked patient to make PCP appointment for INR check next week, she stated she would   , PharmD, BCPS,  Clinical Pharmacist  Please check AMION for all [facility] Pharmacy phone numbers After 10:00 PM, call Main Pharmacy [phone]

## 2021-02-13 ENCOUNTER — Ambulatory Visit (INDEPENDENT_AMBULATORY_CARE_PROVIDER_SITE_OTHER): Payer: Medicare Other

## 2021-02-13 ENCOUNTER — Other Ambulatory Visit: Payer: Self-pay

## 2021-02-13 ENCOUNTER — Telehealth: Payer: Self-pay

## 2021-02-13 DIAGNOSIS — Z5181 Encounter for therapeutic drug level monitoring: Secondary | ICD-10-CM

## 2021-02-13 DIAGNOSIS — I4819 Other persistent atrial fibrillation: Secondary | ICD-10-CM | POA: Diagnosis not present

## 2021-02-13 LAB — POCT INR: INR: 1.5 — AB (ref 2.0–3.0)

## 2021-02-13 NOTE — Telephone Encounter (Signed)
LMOM TO R/S MISSED APPT  

## 2021-02-13 NOTE — Patient Instructions (Signed)
Take 2 tablets today and then continue taking 1 tablet daily except 1/2 tablet Tuesday and Thursday.  INR check 1 week

## 2021-02-22 ENCOUNTER — Other Ambulatory Visit: Payer: Self-pay

## 2021-02-22 ENCOUNTER — Ambulatory Visit (INDEPENDENT_AMBULATORY_CARE_PROVIDER_SITE_OTHER): Payer: Medicare Other

## 2021-02-22 DIAGNOSIS — Z5181 Encounter for therapeutic drug level monitoring: Secondary | ICD-10-CM

## 2021-02-22 DIAGNOSIS — I4819 Other persistent atrial fibrillation: Secondary | ICD-10-CM

## 2021-02-22 LAB — POCT INR: INR: 5 — AB (ref 2.0–3.0)

## 2021-02-22 NOTE — Patient Instructions (Signed)
Hold today and tomorrow and then continue taking 1 tablet daily except 1/2 tablet Tuesday and Thursday.  INR check 2 week

## 2021-02-24 ENCOUNTER — Emergency Department (HOSPITAL_BASED_OUTPATIENT_CLINIC_OR_DEPARTMENT_OTHER)
Admission: EM | Admit: 2021-02-24 | Discharge: 2021-02-24 | Disposition: A | Discharge status: Home / Self Care | Payer: Medicare Other | Attending: Emergency Medicine | Admitting: Emergency Medicine

## 2021-02-24 ENCOUNTER — Other Ambulatory Visit: Payer: Self-pay

## 2021-02-24 ENCOUNTER — Encounter (HOSPITAL_BASED_OUTPATIENT_CLINIC_OR_DEPARTMENT_OTHER): Payer: Self-pay

## 2021-02-24 DIAGNOSIS — I499 Cardiac arrhythmia, unspecified: Secondary | ICD-10-CM | POA: Diagnosis not present

## 2021-02-24 DIAGNOSIS — R197 Diarrhea, unspecified: Secondary | ICD-10-CM | POA: Insufficient documentation

## 2021-02-24 DIAGNOSIS — Z7984 Long term (current) use of oral hypoglycemic drugs: Secondary | ICD-10-CM | POA: Insufficient documentation

## 2021-02-24 DIAGNOSIS — I4819 Other persistent atrial fibrillation: Secondary | ICD-10-CM | POA: Insufficient documentation

## 2021-02-24 DIAGNOSIS — R040 Epistaxis: Secondary | ICD-10-CM

## 2021-02-24 DIAGNOSIS — E119 Type 2 diabetes mellitus without complications: Secondary | ICD-10-CM | POA: Insufficient documentation

## 2021-02-24 DIAGNOSIS — I10 Essential (primary) hypertension: Secondary | ICD-10-CM | POA: Insufficient documentation

## 2021-02-24 DIAGNOSIS — Z79899 Other long term (current) drug therapy: Secondary | ICD-10-CM | POA: Insufficient documentation

## 2021-02-24 LAB — CBC WITH DIFFERENTIAL/PLATELET
Abs Immature Granulocytes: 0.04 10*3/uL (ref 0.00–0.07)
Basophils Absolute: 0 10*3/uL (ref 0.0–0.1)
Basophils Relative: 0 %
Eosinophils Absolute: 0.1 10*3/uL (ref 0.0–0.5)
Eosinophils Relative: 1 %
HCT: 38.8 % (ref 36.0–46.0)
Hemoglobin: 12.8 g/dL (ref 12.0–15.0)
Immature Granulocytes: 0 %
Lymphocytes Relative: 13 %
Lymphs Abs: 1.3 10*3/uL (ref 0.7–4.0)
MCH: 26.7 pg (ref 26.0–34.0)
MCHC: 33 g/dL (ref 30.0–36.0)
MCV: 81 fL (ref 80.0–100.0)
Monocytes Absolute: 0.8 10*3/uL (ref 0.1–1.0)
Monocytes Relative: 8 %
Neutro Abs: 7.9 10*3/uL — ABNORMAL HIGH (ref 1.7–7.7)
Neutrophils Relative %: 78 %
Platelets: 302 10*3/uL (ref 150–400)
RBC: 4.79 MIL/uL (ref 3.87–5.11)
RDW: 15.2 % (ref 11.5–15.5)
WBC: 10.1 10*3/uL (ref 4.0–10.5)
nRBC: 0 % (ref 0.0–0.2)

## 2021-02-24 LAB — COMPREHENSIVE METABOLIC PANEL
ALT: 12 U/L (ref 0–44)
AST: 15 U/L (ref 15–41)
Albumin: 3.8 g/dL (ref 3.5–5.0)
Alkaline Phosphatase: 69 U/L (ref 38–126)
Anion gap: 11 (ref 5–15)
BUN: 10 mg/dL (ref 8–23)
CO2: 23 mmol/L (ref 22–32)
Calcium: 9.3 mg/dL (ref 8.9–10.3)
Chloride: 102 mmol/L (ref 98–111)
Creatinine, Ser: 0.57 mg/dL (ref 0.44–1.00)
GFR, Estimated: >60 mL/min (ref >60)
Glucose, Bld: 221 mg/dL — ABNORMAL HIGH (ref 70–99)
Potassium: 3.7 mmol/L (ref 3.5–5.1)
Sodium: 136 mmol/L (ref 135–145)
Total Bilirubin: 0.5 mg/dL (ref 0.3–1.2)
Total Protein: 7.2 g/dL (ref 6.5–8.1)

## 2021-02-24 LAB — PROTIME-INR
INR: 2.3 — ABNORMAL HIGH (ref 0.8–1.2)
Prothrombin Time: 25.4 seconds — ABNORMAL HIGH (ref 11.4–15.2)

## 2021-02-24 LAB — LIPASE, BLOOD: Lipase: <10 U/L — ABNORMAL LOW (ref 11–51)

## 2021-02-24 MED ORDER — SODIUM CHLORIDE 0.9 % IV BOLUS
1000.0000 mL | Freq: Once | INTRAVENOUS | Status: AC
  Administered 2021-02-24: 500 mL via INTRAVENOUS

## 2021-02-24 MED ORDER — OXYMETAZOLINE HCL 0.05 % NA SOLN
1.0000 | Freq: Once | NASAL | Status: DC
  Filled 2021-02-24: qty 30

## 2021-02-24 NOTE — ED Notes (Signed)
Pt dc home with belongings. Pt states understanding of dc instructions.  

## 2021-02-24 NOTE — ED Triage Notes (Addendum)
Pt BIB EMS for nose bleeds - bleeding controlled at this time   States she started having nose bleeds about 1.5 hr - denies trauma - pt takes warfarin    Pt was in [place] 3 wks ago  following a fall

## 2021-02-24 NOTE — ED Provider Notes (Signed)
[facility] EMERGENCY DEPT Provider Note   CSN: [id number] Arrival date & time: 02/24/21  1434     History Chief Complaint  Patient presents with   Epistaxis    Amy Wright is a 80 y.o. female.  HPI     80 year old female with history of atrial fibrillation on Coumadin, hypertension, sleep apnea, diabetes, recent hospitalization with coagulopathy secondary to Coumadin, initial suspected UTI and epistaxis presents with concern for epistaxis and diarrhea.  Reports she had an episode of epistaxis at home.  She put a potato chip bag clip on her nose to hold continuous pressure, and had a in place for 1 hour, and removed it in the emergency department without any further bleeding.  Bleeding was coming from her right nose, the same area that had been bleeding while she was in the hospital.  She denies any additional trauma.  Her INR was elevated to 5 and she was instructed to hold her Coumadin dosing through the weekend.  She reports that when she was taking brand-name Coumadin she did not have issues, but since she has switched warfarin she has been having issues, and would like to discontinue the medication.  She reports she has been having diarrhea for about a week, about 3 times per day or every time she eats something.  Denies nausea, vomiting.  Reports some cramping pain when she has diarrhea but otherwise denies any abdominal pain.  No fevers.  She was recently on antibiotics for the possible urinary tract infection.Denies black or bloody stools.  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 Central jet. Aortic Valve appears to be mildly sclerotic.  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   Depression 03/22/2014   Insomnia 03/22/2014   Vaginitis 03/22/2014   Hyperlipemia 03/22/2014   Hypertriglyceridemia 03/22/2014   Atrial fibrillation (HCC) 08/09/2013   Obstructive sleep apnea syndrome 08/07/2013   Persistent atrial fibrillation (HCC) 03/29/2013   Anticoagulated on Coumadin 03/29/2013   DM2 (diabetes mellitus, type 2) (HCC) 03/29/2013   Mixed hyperlipidemia 03/29/2013   Essential hypertension 03/29/2013    Past Surgical History:  Procedure Laterality Date   APPENDECTOMY  1971   CARDIOVERSION N/A 06/23/2013   Procedure: CARDIOVERSION;  Surgeon: Lennette Bihari, MD;  Location: [facility] ENDOSCOPY;  Service: Cardiovascular;  Laterality: N/A;   CATARACT EXTRACTION W/ INTRAOCULAR LENS IMPLANT Bilateral    CESAREAN SECTION  1971   PLACEMENT OF BREAST IMPLANTS Bilateral 1985     OB History   No obstetric history on file.     Family  History  Problem Relation Age of Onset   Heart attack Mother    Cardiomyopathy Mother    Cancer - Lung Father    Cancer - Other Maternal Grandmother    Stroke Paternal Grandfather    Diabetes Maternal Grandfather     Social History   Tobacco Use   Passive exposure: Yes   Smokeless tobacco: Never  Substance Use Topics   Alcohol use: Yes    Comment: 08/09/2013 "nothing to drink in >4 yrs; never had a problem w/it"   Drug use: No    Home  Medications Prior to Admission medications   Medication Sig Start Date End Date Taking? Authorizing Provider  ACCU-CHEK COMPACT PLUS test strip CHECK BLOOD SUGAR TWICE DAILY OR AS DIRECTED 02/27/16   [provider]  acetaminophen (TYLENOL) 500 MG tablet Take 1,000 mg by mouth every 6 (six) hours as needed for headache or mild pain (pain).    [provider]  Ascorbic Acid (VITAMIN C) 1000 MG tablet Take 2,000 mg by mouth daily.    [provider]  cholecalciferol (VITAMIN D3) 25 MCG (1000 UT) tablet Take 1,000 Units by mouth daily.    [provider]  diltiazem (CARDIZEM CD) 360 MG 24 hr capsule TAKE 1 CAPSULE BY MOUTH EVERY MORNING. OVERDUE APPT WITH DR. ALLRED BEFORE ANYMORE REFILLS. Patient taking differently: Take 360 mg by mouth daily. 01/22/21   Allred, Fayrene FearingJames, MD  ezetimibe (ZETIA) 10 MG tablet Take 10 mg by mouth at bedtime.     [provider]  folic acid (FOLVITE) 1 MG tablet Take 1 mg by mouth every morning.     [provider]  furosemide (LASIX) 40 MG tablet Take 40 mg by mouth daily as needed for edema.    [provider]  glipiZIDE (GLUCOTROL) 10 MG tablet Take 10 mg by mouth every morning.  03/24/17   [provider]  LORazepam (ATIVAN) 0.5 MG tablet Take 0.5 mg by mouth daily as needed for anxiety (jitters).     [provider]  magnesium oxide (MAG-OX) 400 MG tablet Take 1 tablet (400 mg total) by mouth daily. 02/14/16   Gypsy BalsamSeiler, Amber K, NP  Melatonin 3 MG TABS Take 3 mg by mouth at bedtime as needed (sleep).    [provider]  metFORMIN (GLUCOPHAGE) 1000 MG tablet Take 1,000 mg by mouth 2 (two) times daily with a meal.    [provider]  metoprolol succinate (TOPROL-XL) 50 MG 24 hr tablet TAKE 2 TABLETS IN THE MORNING. TAKE 1 TABLET AT NIGHT. PLEASE KEEP UPCOMING APPT IN APRIL 2022 BEFORE ANYMORE REFILLS. THANK YOU Patient taking differently: Take 50-100 mg by mouth See admin  instructions. Take 2 tabs ( 100 mg) total in the AM, then 1 tab (50 mg) in the evening 01/14/21   Allred, Fayrene FearingJames, MD  MYRBETRIQ 50 MG TB24 tablet Take 50 mg by mouth daily. 01/21/21   [provider]  potassium chloride (KLOR-CON) 10 MEQ tablet TAKE 1 TABLET BY MOUTH EVERY DAY Patient taking differently: Take 10 mEq by mouth daily. 01/14/21   Allred, Fayrene FearingJames, MD  Semaglutide,0.25 or 0.5MG /DOS, (OZEMPIC, 0.25 OR 0.5 MG/DOSE,) 2 MG/1.5ML SOPN Inject 0.5 mg into the skin once a week. Sunday/ Monday    [provider]  sertraline (ZOLOFT) 100 MG tablet Take 100 mg by mouth every morning. 02/04/17   [provider]  sodium chloride (OCEAN) 0.65 % nasal spray Place 1 spray into the nose as needed for congestion.  [provider]  telmisartan (MICARDIS) 80 MG tablet Take 80 mg by mouth daily.    [provider]  vitamin B-12 (CYANOCOBALAMIN) 1000 MCG tablet Take 1,000 mcg by mouth every morning.    [provider]  VITAMIN E PO Take 1 capsule by mouth once a week.    [provider]  warfarin (COUMADIN) 5 MG tablet Take 1 tablet daily as directed, must schedule INR appt for further refills Patient taking differently: Take 5 mg by mouth daily at 4 PM. Take 1 tablet daily as directed, must schedule INR appt for further refills 09/24/20   Allred, Fayrene Fearing, MD  zolpidem (AMBIEN) 5 MG tablet Take 5 mg by mouth at bedtime.    [provider]    Allergies    Rosuvastatin calcium, Statins, Amoxicillin, Atorvastatin, Ezetimibe-simvastatin, and Penicillins  Review of Systems   Review of Systems  Constitutional:  Positive for fatigue. Negative for fever.  HENT:  Positive for nosebleeds. Negative for sore throat.   Eyes:  Negative for visual disturbance.  Respiratory:  Negative for cough and shortness of breath.   Cardiovascular:  Negative for chest pain.  Gastrointestinal:  Positive for diarrhea. Negative for abdominal pain, nausea and vomiting.   Genitourinary:  Negative for difficulty urinating.  Musculoskeletal:  Negative for back pain and neck pain.  Skin:  Negative for rash.  Neurological:  Negative for syncope and headaches.   Physical Exam Updated Vital Signs BP (!) 150/90   Pulse 100   Temp (!) 97 F (36.1 C)   Resp 20   Ht 5\' 6"  (1.676 m)   Wt 90.7 kg   SpO2 100%   BMI 32.28 kg/m   Physical Exam Vitals and nursing note reviewed.  Constitutional:      General: She is not in acute distress.    Appearance: She is well-developed. She is not diaphoretic.  HENT:     Head: Normocephalic and atraumatic.     Nose:     Comments: Right side with clotted blood, no evidence of continued bleeding Eyes:     Conjunctiva/sclera: Conjunctivae normal.  Cardiovascular:     Rate and Rhythm: Normal rate. Rhythm irregular.  Pulmonary:     Effort: Pulmonary effort is normal. No respiratory distress.  Abdominal:     General: There is no distension.     Palpations: Abdomen is soft.     Tenderness: There is no abdominal tenderness. There is no guarding.  Musculoskeletal:        General: No tenderness.     Cervical back: Normal range of motion.  Skin:    General: Skin is warm and dry.     Findings: No erythema or rash.  Neurological:     Mental Status: She is alert and oriented to person, place, and time.    ED Results / Procedures / Treatments   Labs (all labs ordered are listed, but only abnormal results are displayed) Labs Reviewed  CBC WITH DIFFERENTIAL/PLATELET - Abnormal; Notable for the following components:      Result Value   Neutro Abs 7.9 (*)    All other components within normal limits  COMPREHENSIVE METABOLIC PANEL - Abnormal; Notable for the following components:   Glucose, Bld 221 (*)    All other components within normal limits  LIPASE, BLOOD - Abnormal; Notable for the following components:   Lipase <10 (*)    All other components within normal limits  PROTIME-INR - Abnormal; Notable for the  following components:  Prothrombin Time 25.4 (*)    INR 2.3 (*)    All other components within normal limits    EKG EKG Interpretation  Date/Time:  Sunday February 24 2021 15:42:34 EDT Ventricular Rate:  76 PR Interval:    QRS Duration: 88 QT Interval:  388 QTC Calculation: 437 R Axis:   63 Text Interpretation: Atrial fibrillation Low voltage, precordial leads Borderline repolarization abnormality No significant change was found Confirmed by Sofia Jaquith ([id number]) on 02/25/2021 11:07:06 AM  Radiology No results found.  Procedures Procedures   Medications Ordered in ED Medications  sodium chloride 0.9 % bolus 1,000 mL (500 mLs Intravenous New Bag/Given 02/24/21 1557)    ED Course  I have reviewed the triage vital signs and the nursing notes.  Pertinent labs & imaging results that were available during my care of the patient were reviewed by me and considered in my medical decision making (see chart for details).    MDM Rules/Calculators/A&P                           80  year old female with history of atrial fibrillation on Coumadin, hypertension, sleep apnea, diabetes, recent hospitalization with coagulopathy secondary to Coumadin, initial suspected UTI and epistaxis presents with concern for epistaxis and diarrhea.  INR was supratherpeutic to 5 recently, likely secondary to diarrhea.  INR today is therapuetic, recommend continuing normal coumadin dosing and close monitoring.  Epistaxis was stopped with constant compression prior to arrival and recommend using same method if bleeding returns.  Recommend having discussion with PCP/Cardiology regarding anticoagulation.  Regarding diarrhea--benign abdominal exam, doubt diverticulitis or other focal finding. Did not have diarrhea to send in ED. Denies black or bloody stool, hgb normal.  Was recently on abx--recommend PCP follow up, consider outpt cdiff testing or gi pathogen panel.  No significant electrolyte abnormalities or AKI.   Recommend continued supportive care. Patient discharged in stable condition with understanding of reasons to return.     Final Clinical Impression(s) / ED Diagnoses Final diagnoses:  Diarrhea, unspecified type  Epistaxis    Rx / DC Orders ED Discharge Orders     None        , MD 02/25/21 1136

## 2021-02-27 ENCOUNTER — Ambulatory Visit: Payer: Medicare Other | Admitting: Internal Medicine

## 2021-02-27 ENCOUNTER — Encounter: Payer: Self-pay | Admitting: Internal Medicine

## 2021-02-27 ENCOUNTER — Other Ambulatory Visit: Payer: Self-pay

## 2021-02-27 VITALS — BP 128/78 | HR 86 | Ht 64.0 in | Wt 244.0 lb

## 2021-02-27 DIAGNOSIS — I4819 Other persistent atrial fibrillation: Secondary | ICD-10-CM | POA: Diagnosis not present

## 2021-02-27 DIAGNOSIS — G4733 Obstructive sleep apnea (adult) (pediatric): Secondary | ICD-10-CM

## 2021-02-27 DIAGNOSIS — Z9989 Dependence on other enabling machines and devices: Secondary | ICD-10-CM

## 2021-02-27 DIAGNOSIS — I10 Essential (primary) hypertension: Secondary | ICD-10-CM

## 2021-02-27 NOTE — Progress Notes (Signed)
PCP: Angelica Chessman, MD   Primary EP: Dr Johney Frame  Amy Wright is a 80 y.o. female who presents today for routine electrophysiology followup.  Since last being seen in our clinic, the patient reports doing reasonably well.  She has lost 20 lbs.  She is not very active.   Today, she denies symptoms of palpitations, chest pain, shortness of breath,  lower extremity edema, dizziness, presyncope, or syncope.  The patient is otherwise without complaint today.   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 Central jet. Aortic Valve appears to be mildly sclerotic. Trace aortic regurgitation.EF >55%   Past Surgical History:  Procedure Laterality Date   APPENDECTOMY  1971   CARDIOVERSION N/A 06/23/2013   Procedure: CARDIOVERSION;  Surgeon: Lennette Bihari, MD;  Location: [facility] ENDOSCOPY;  Service: Cardiovascular;  Laterality: N/A;   CATARACT EXTRACTION W/ INTRAOCULAR LENS IMPLANT Bilateral    CESAREAN SECTION  1971   PLACEMENT OF BREAST IMPLANTS Bilateral 1985    ROS- all systems are reviewed and negatives except as per HPI above  Current Outpatient Medications  Medication Sig Dispense Refill   ACCU-CHEK COMPACT PLUS test strip CHECK BLOOD SUGAR TWICE DAILY OR AS DIRECTED  0   acetaminophen (TYLENOL) 500 MG tablet Take 1,000 mg by mouth every 6 (six) hours as needed for headache or mild pain (pain).     Ascorbic Acid (VITAMIN C) 1000 MG tablet Take 2,000 mg by mouth daily.     cholecalciferol (VITAMIN D3) 25 MCG (1000 UT) tablet Take 1,000 Units by mouth daily.     diltiazem (CARDIZEM CD) 360 MG 24 hr capsule TAKE 1 CAPSULE BY MOUTH EVERY MORNING. OVERDUE APPT WITH DR. Labria Wos  BEFORE ANYMORE REFILLS. 90 capsule 3   ezetimibe (ZETIA) 10 MG tablet Take 10 mg by mouth at bedtime.      folic acid (FOLVITE) 1 MG tablet Take 1 mg by mouth every morning.      furosemide (LASIX) 40 MG tablet Take 40 mg by mouth daily as needed for edema.     glipiZIDE (GLUCOTROL) 10 MG tablet Take 10 mg by mouth every morning.      LORazepam (ATIVAN) 0.5 MG tablet Take 0.5 mg by mouth daily as needed for anxiety (jitters).      magnesium oxide (MAG-OX) 400 MG tablet Take 1 tablet (400 mg total) by mouth daily. 30 tablet 5   Melatonin 3 MG TABS Take 3 mg by mouth at bedtime as needed (sleep).     metFORMIN (GLUCOPHAGE) 1000 MG tablet Take 1,000 mg by mouth 2 (two) times daily with a meal.     metoprolol succinate (TOPROL-XL) 50 MG 24 hr tablet TAKE 2 TABLETS IN THE MORNING. TAKE 1 TABLET AT NIGHT. PLEASE KEEP UPCOMING APPT IN APRIL 2022 BEFORE ANYMORE REFILLS. THANK YOU 270 tablet 3   MYRBETRIQ 50 MG TB24 tablet Take 50 mg by mouth daily.     potassium chloride (KLOR-CON) 10 MEQ tablet TAKE 1 TABLET BY MOUTH EVERY DAY 90 tablet 3   Semaglutide,0.25 or 0.5MG /DOS, (OZEMPIC, 0.25 OR 0.5 MG/DOSE,) 2 MG/1.5ML SOPN Inject 0.5  mg into the skin once a week. Sunday/ Monday     sertraline (ZOLOFT) 100 MG tablet Take 100 mg by mouth every morning.     sodium chloride (OCEAN) 0.65 % nasal spray Place 1 spray into the nose as needed for congestion.     telmisartan (MICARDIS) 80 MG tablet Take 80 mg by mouth daily.     vitamin B-12 (CYANOCOBALAMIN) 1000 MCG tablet Take 1,000 mcg by mouth every morning.     VITAMIN E PO Take 1 capsule by mouth once a week.     warfarin (COUMADIN) 5 MG tablet Take 1 tablet daily as directed, must schedule INR appt for further refills (Patient taking differently: Take 5 mg by mouth daily at 4 PM. Take 1 tablet daily as directed, must schedule INR appt for further refills) 15 tablet 1   zolpidem (AMBIEN) 5 MG tablet Take 5 mg by mouth at bedtime.     No current  facility-administered medications for this visit.    Physical Exam: Vitals:   02/27/21 0933  BP: 128/78  Pulse: 86  SpO2: 94%  Height: 5\' 4"  (1.626 m)    GEN- The patient is overweight appearing, alert and oriented x 3 today.   Head- normocephalic, atraumatic Eyes-  Sclera clear, conjunctiva pink Ears- hearing intact Oropharynx- clear Lungs-  normal work of breathing Heart- irregular rate and rhythm  GI- soft, NT, ND, + BS Extremities- no clubbing, cyanosis, or edema  Wt Readings from Last 3 Encounters:  02/24/21 200 lb (90.7 kg)  01/29/21 231 lb 7.7 oz (105 kg)  01/13/21 200 lb (90.7 kg)    EKG tracing ordered today is personally reviewed and shows afib, V rate 80s   Assessment and Plan:  Permanent afib Rate controlled Minimally ssymptomatic Chads2vasc score is 5.  She is on coumadin for stroke prevention She has had labile INRs.  Did not previously tolerate eliquis. Today, we discussed options of xarelto, pradaxa, and Watchman at length.  She does not wish to make changes at this time.  2. HTN Continue mycardis 80mg  daily  3. OSA Compliance with CPAP advised  Risks, benefits and potential toxicities for medications prescribed and/or refilled reviewed with patient today.   Return to see EP PA every 6 months I will see as needed  03/15/21 MD, [facility] 02/27/2021 9:37 AM

## 2021-02-27 NOTE — Patient Instructions (Addendum)
Medication Instructions:  Your physician recommends that you continue on your current medications as directed. Please refer to the Current Medication list given to you today.  Labwork: None ordered.  Testing/Procedures: None ordered.  Follow-Up: Your physician wants you to follow-up in: 6 month with  Francis Dowse, PA-C    You will receive a reminder letter in the mail two months in advance. If you don't receive a letter, please call our office to schedule the follow-up appointment.   Any Other Special Instructions Will Be Listed Below (If Applicable).  If you need a refill on your cardiac medications before your next appointment, please call your pharmacy.

## 2021-03-08 ENCOUNTER — Other Ambulatory Visit: Payer: Self-pay

## 2021-03-08 ENCOUNTER — Ambulatory Visit (INDEPENDENT_AMBULATORY_CARE_PROVIDER_SITE_OTHER): Payer: Medicare Other

## 2021-03-08 DIAGNOSIS — I4819 Other persistent atrial fibrillation: Secondary | ICD-10-CM

## 2021-03-08 DIAGNOSIS — Z5181 Encounter for therapeutic drug level monitoring: Secondary | ICD-10-CM | POA: Diagnosis not present

## 2021-03-08 LAB — POCT INR: INR: 2 (ref 2.0–3.0)

## 2021-03-08 NOTE — Patient Instructions (Signed)
continue taking 1 tablet daily except 1/2 tablet Tuesday and Thursday.  INR check 4 weeks

## 2021-03-14 ENCOUNTER — Telehealth: Payer: Self-pay

## 2021-03-14 ENCOUNTER — Telehealth: Payer: Self-pay | Admitting: Internal Medicine

## 2021-03-14 MED ORDER — WARFARIN SODIUM 5 MG PO TABS: ORAL_TABLET | ORAL | 1 refills | Status: DC

## 2021-03-14 NOTE — Telephone Encounter (Signed)
    *  STAT* If patient is at the pharmacy, call can be transferred to refill team.   1. Which medications need to be refilled? (please list name of each medication and dose if known) warfarin (COUMADIN) 5 MG tablet  2. Which pharmacy/location (including street and city if local pharmacy) is medication to be sent to? [id number] - [place], [place] - [address]  3. Do they need a 30 day or 90 day supply? 90 days

## 2021-03-14 NOTE — Telephone Encounter (Signed)
I spoke to the patient and informed her that we have initiated the process of completing the home tester INR  paperwork.  She verbalized understanding.

## 2021-03-20 NOTE — Telephone Encounter (Signed)
Patient calling and inquiring about Home Self check machine status.  She also mentioned Diarrhea and I will bring her in Friday 7/15 for INR check.

## 2021-03-20 NOTE — Telephone Encounter (Signed)
Patient called back asking to speak with you. She stated you been helping her. Please advise

## 2021-03-21 ENCOUNTER — Telehealth: Payer: Self-pay

## 2021-03-21 NOTE — Telephone Encounter (Signed)
-----   Message from [place], [place] sent at 03/20/2021  4:48 PM EDT ----- Regarding: check on home meter status Check on [employer] home meter

## 2021-03-21 NOTE — Telephone Encounter (Signed)
Called and lmomed the pt that we do not have a determination as of yet

## 2021-04-07 ENCOUNTER — Other Ambulatory Visit: Payer: Self-pay | Admitting: Internal Medicine

## 2021-04-08 ENCOUNTER — Other Ambulatory Visit: Payer: Self-pay

## 2021-04-08 NOTE — Telephone Encounter (Signed)
This encounter was created in error - please disregard.

## 2021-04-08 NOTE — Telephone Encounter (Signed)
Prescription refill request received for warfarin Lov: 02/27/21 (Allred) Next INR check: 04/08/21  Warfarin tablet strength: 5mg    Appropriate dose and refill sent to requested pharmacy

## 2021-04-12 ENCOUNTER — Ambulatory Visit (INDEPENDENT_AMBULATORY_CARE_PROVIDER_SITE_OTHER): Payer: Medicare Other

## 2021-04-12 ENCOUNTER — Other Ambulatory Visit: Payer: Self-pay

## 2021-04-12 DIAGNOSIS — Z5181 Encounter for therapeutic drug level monitoring: Secondary | ICD-10-CM

## 2021-04-12 DIAGNOSIS — I4819 Other persistent atrial fibrillation: Secondary | ICD-10-CM

## 2021-04-12 LAB — POCT INR: INR: 2.7 (ref 2.0–3.0)

## 2021-04-12 NOTE — Patient Instructions (Signed)
continue taking 1 tablet daily except 1/2 tablet Tuesday and Thursday.  INR check 6 weeks

## 2021-04-15 ENCOUNTER — Telehealth: Payer: Self-pay

## 2021-04-15 NOTE — Telephone Encounter (Signed)
Lpm informing her that Dr Johney Frame signed the "Self Tester" form for INR checks and that I will follow up on this.

## 2021-04-24 ENCOUNTER — Other Ambulatory Visit: Payer: Self-pay | Admitting: Internal Medicine

## 2021-04-24 MED ORDER — WARFARIN SODIUM 5 MG PO TABS: ORAL_TABLET | ORAL | 1 refills | Status: DC

## 2021-04-24 NOTE — Telephone Encounter (Signed)
Prescription refill request received for warfarin Lov: 02/27/21 Next INR check: 05/24/21 Warfarin tablet strength: 5mg   Appropriate dose and refill sent to requested pharmacy.

## 2021-04-24 NOTE — Telephone Encounter (Signed)
*  STAT* If patient is at the pharmacy, call can be transferred to refill team.   1. Which medications need to be refilled? (please list name of each medication and dose if known) warfarin (COUMADIN) 5 MG tablet  2. Which pharmacy/location (including street and city if local pharmacy) is medication to be sent to? [id number] - [place], [place] - [address]  3. Do they need a 30 day or 90 day supply?  90 day supply

## 2021-04-29 ENCOUNTER — Ambulatory Visit (INDEPENDENT_AMBULATORY_CARE_PROVIDER_SITE_OTHER): Payer: Medicare Other | Admitting: Cardiology

## 2021-04-29 DIAGNOSIS — I4811 Longstanding persistent atrial fibrillation: Secondary | ICD-10-CM | POA: Diagnosis not present

## 2021-04-29 DIAGNOSIS — Z5181 Encounter for therapeutic drug level monitoring: Secondary | ICD-10-CM

## 2021-04-29 LAB — POCT INR: INR: 4.3 — AB (ref 2.0–3.0)

## 2021-04-29 NOTE — Patient Instructions (Signed)
Description   Spoke with patient and advised to hold today's dose and eat greens, then continue taking 1 tablet daily except 1/2 tablet Tuesday and Thursday.  INR check 1 week

## 2021-05-06 ENCOUNTER — Telehealth: Payer: Self-pay

## 2021-05-06 NOTE — Telephone Encounter (Signed)
I spoke to the patient and she will get sample tonight.

## 2021-05-08 NOTE — Telephone Encounter (Signed)
Lmom for overdue inr 

## 2021-05-09 NOTE — Telephone Encounter (Signed)
Lmom for overdue inr 

## 2021-05-10 NOTE — Telephone Encounter (Signed)
Lmom for overdue inr 

## 2021-05-14 NOTE — Telephone Encounter (Signed)
Lmom for overdue inr 

## 2021-05-15 NOTE — Telephone Encounter (Signed)
LMOM FOR OVERDUE INR 

## 2021-05-17 ENCOUNTER — Other Ambulatory Visit: Payer: Self-pay | Admitting: Internal Medicine

## 2021-05-17 NOTE — Telephone Encounter (Signed)
Called and spoke w/pt and they stated that they have issues w/home meter so I scheduled an appt and asked them to bring meter for training

## 2021-05-17 NOTE — Telephone Encounter (Signed)
LMOM FOR OVERDUE INR 

## 2021-05-20 ENCOUNTER — Other Ambulatory Visit: Payer: Self-pay

## 2021-05-20 ENCOUNTER — Ambulatory Visit (INDEPENDENT_AMBULATORY_CARE_PROVIDER_SITE_OTHER): Payer: Medicare Other

## 2021-05-20 DIAGNOSIS — Z5181 Encounter for therapeutic drug level monitoring: Secondary | ICD-10-CM

## 2021-05-20 DIAGNOSIS — I4819 Other persistent atrial fibrillation: Secondary | ICD-10-CM

## 2021-05-20 LAB — POCT INR: INR: 1 — AB (ref 2.0–3.0)

## 2021-05-20 NOTE — Patient Instructions (Signed)
Take 2 tablets tonight only and then  continue taking 1 tablet daily except 1/2 tablet Tuesday and Thursday.  INR check 1 week

## 2021-05-27 ENCOUNTER — Telehealth: Payer: Self-pay

## 2021-05-27 LAB — POCT INR: INR: 1.6 — AB (ref 2.0–3.0)

## 2021-05-27 NOTE — Telephone Encounter (Signed)
Patient was returning call. Please advise ?

## 2021-05-27 NOTE — Telephone Encounter (Signed)
Lmomed pt to please check inr

## 2021-05-27 NOTE — Telephone Encounter (Signed)
Lmom for overdue inr 

## 2021-05-28 ENCOUNTER — Ambulatory Visit (INDEPENDENT_AMBULATORY_CARE_PROVIDER_SITE_OTHER): Payer: Medicare Other | Admitting: Cardiology

## 2021-05-28 DIAGNOSIS — Z5181 Encounter for therapeutic drug level monitoring: Secondary | ICD-10-CM

## 2021-05-30 ENCOUNTER — Other Ambulatory Visit: Payer: Self-pay

## 2021-05-30 MED ORDER — WARFARIN SODIUM 5 MG PO TABS: ORAL_TABLET | ORAL | 1 refills | Status: DC

## 2021-06-04 ENCOUNTER — Ambulatory Visit (INDEPENDENT_AMBULATORY_CARE_PROVIDER_SITE_OTHER): Payer: Medicare Other | Admitting: Pharmacist Clinician (PhC)/ Clinical Pharmacy Specialist

## 2021-06-04 DIAGNOSIS — I4811 Longstanding persistent atrial fibrillation: Secondary | ICD-10-CM | POA: Diagnosis not present

## 2021-06-04 DIAGNOSIS — Z7901 Long term (current) use of anticoagulants: Secondary | ICD-10-CM | POA: Diagnosis not present

## 2021-06-04 LAB — POCT INR: INR: 1.7 — AB (ref 2.0–3.0)

## 2021-06-12 ENCOUNTER — Ambulatory Visit (INDEPENDENT_AMBULATORY_CARE_PROVIDER_SITE_OTHER): Payer: Medicare Other | Admitting: Cardiology

## 2021-06-12 ENCOUNTER — Telehealth: Payer: Self-pay

## 2021-06-12 DIAGNOSIS — Z5181 Encounter for therapeutic drug level monitoring: Secondary | ICD-10-CM

## 2021-06-12 LAB — POCT INR: INR: 1.5 — AB (ref 2.0–3.0)

## 2021-06-12 NOTE — Telephone Encounter (Signed)
Lpm to check INR 

## 2021-06-20 ENCOUNTER — Ambulatory Visit (INDEPENDENT_AMBULATORY_CARE_PROVIDER_SITE_OTHER): Payer: Medicare Other | Admitting: Cardiology

## 2021-06-20 DIAGNOSIS — Z5181 Encounter for therapeutic drug level monitoring: Secondary | ICD-10-CM

## 2021-06-20 LAB — POCT INR: INR: 2.8 (ref 2.0–3.0)

## 2021-07-05 ENCOUNTER — Telehealth: Payer: Self-pay

## 2021-07-05 NOTE — Telephone Encounter (Signed)
I spoke to the patient and reminded her to check INR today.  She verbalized understanding 

## 2021-07-08 ENCOUNTER — Telehealth: Payer: Self-pay

## 2021-07-08 LAB — POCT INR: INR: 2.3 (ref 2.0–3.0)

## 2021-07-08 NOTE — Telephone Encounter (Signed)
  Patient is calling with INR results. I was unable to get anyone in coumadin clinic or triage. INR is 2.3

## 2021-07-08 NOTE — Telephone Encounter (Signed)
Lpm to check INR 

## 2021-07-09 ENCOUNTER — Ambulatory Visit (INDEPENDENT_AMBULATORY_CARE_PROVIDER_SITE_OTHER): Payer: Medicare Other | Admitting: Cardiovascular Disease

## 2021-07-09 DIAGNOSIS — Z5181 Encounter for therapeutic drug level monitoring: Secondary | ICD-10-CM | POA: Diagnosis not present

## 2021-07-09 NOTE — Telephone Encounter (Signed)
Please see anticoag encounter for further documentation.

## 2021-07-09 NOTE — Patient Instructions (Signed)
Description   Called and spoke to pt and instructed her to continue to take warfarin 1 tablet daily. Recheck INR in 2 weeks.

## 2021-07-25 ENCOUNTER — Telehealth: Payer: Self-pay

## 2021-07-25 NOTE — Telephone Encounter (Signed)
Lpm to check INR 

## 2021-07-26 ENCOUNTER — Telehealth: Payer: Self-pay

## 2021-07-26 NOTE — Telephone Encounter (Signed)
Lpm to check INR 

## 2021-07-29 ENCOUNTER — Ambulatory Visit (INDEPENDENT_AMBULATORY_CARE_PROVIDER_SITE_OTHER): Payer: Medicare Other | Admitting: Cardiology

## 2021-07-29 DIAGNOSIS — Z5181 Encounter for therapeutic drug level monitoring: Secondary | ICD-10-CM

## 2021-07-29 LAB — POCT INR: INR: 2.3 (ref 2.0–3.0)

## 2021-08-13 ENCOUNTER — Telehealth: Payer: Self-pay | Admitting: *Deleted

## 2021-08-13 NOTE — Telephone Encounter (Signed)
Pt is overdue to have INR checked. Pt self checks her INR. Called pt and made her aware she was due to have her INR checked. Pt stated that she has it on her calender and will get around to doing it today.

## 2021-08-14 ENCOUNTER — Telehealth: Payer: Self-pay

## 2021-08-14 LAB — POCT INR: INR: 1.6 — AB (ref 2.0–3.0)

## 2021-08-14 NOTE — Telephone Encounter (Signed)
Lpm to check INR 

## 2021-08-15 ENCOUNTER — Ambulatory Visit (INDEPENDENT_AMBULATORY_CARE_PROVIDER_SITE_OTHER): Payer: Medicare Other | Admitting: Cardiology

## 2021-08-15 ENCOUNTER — Telehealth: Payer: Self-pay

## 2021-08-15 DIAGNOSIS — Z5181 Encounter for therapeutic drug level monitoring: Secondary | ICD-10-CM

## 2021-08-15 NOTE — Telephone Encounter (Signed)
New Message:   Patient wanted to report her INR  from yesterday(08-14-21). It was 1.6

## 2021-08-28 ENCOUNTER — Telehealth: Payer: Self-pay

## 2021-08-28 NOTE — Telephone Encounter (Signed)
LPM to check INR. 

## 2021-08-29 ENCOUNTER — Encounter: Payer: Self-pay | Admitting: Cardiovascular Disease

## 2021-08-29 NOTE — Progress Notes (Signed)
This encounter was created in error - please disregard.

## 2021-09-05 ENCOUNTER — Telehealth: Payer: Self-pay | Admitting: *Deleted

## 2021-09-05 NOTE — Telephone Encounter (Signed)
Pt due to have INR checked. Called pt and LMOM for pt to call coumadin clinic back.

## 2021-09-10 ENCOUNTER — Telehealth: Payer: Self-pay

## 2021-09-10 NOTE — Telephone Encounter (Signed)
Lmom for overdue inr 

## 2021-09-11 ENCOUNTER — Telehealth: Payer: Self-pay

## 2021-09-11 NOTE — Telephone Encounter (Signed)
Lpm to check INR 

## 2021-09-13 LAB — POCT INR: INR: 1.6 — AB (ref 2.0–3.0)

## 2021-09-18 ENCOUNTER — Telehealth: Payer: Self-pay

## 2021-09-18 NOTE — Telephone Encounter (Signed)
Lpm to check INR 

## 2021-09-19 ENCOUNTER — Ambulatory Visit (INDEPENDENT_AMBULATORY_CARE_PROVIDER_SITE_OTHER): Payer: Medicare Other | Admitting: Cardiology

## 2021-09-19 DIAGNOSIS — Z5181 Encounter for therapeutic drug level monitoring: Secondary | ICD-10-CM

## 2021-09-27 ENCOUNTER — Telehealth: Payer: Self-pay

## 2021-09-27 NOTE — Telephone Encounter (Signed)
Lpm to check INR 

## 2021-09-30 ENCOUNTER — Telehealth: Payer: Self-pay

## 2021-09-30 NOTE — Telephone Encounter (Signed)
Lpm to check INR 

## 2021-10-01 ENCOUNTER — Ambulatory Visit (INDEPENDENT_AMBULATORY_CARE_PROVIDER_SITE_OTHER): Payer: Medicare Other | Admitting: Pharmacist

## 2021-10-01 DIAGNOSIS — I4811 Longstanding persistent atrial fibrillation: Secondary | ICD-10-CM

## 2021-10-01 DIAGNOSIS — Z7901 Long term (current) use of anticoagulants: Secondary | ICD-10-CM

## 2021-10-01 LAB — POCT INR: INR: 1.5 — AB (ref 2.0–3.0)

## 2021-10-01 NOTE — Patient Instructions (Signed)
Description   TAKE 2 TABLETS TONIGHT ONLY and then increase to 1 tablet daily, except 1.5 tablets Wednesdays and Saturdays. Recheck INR in 2 weeks.

## 2021-10-04 ENCOUNTER — Ambulatory Visit: Payer: Medicare Other | Admitting: Physician Assistant

## 2021-10-04 NOTE — Progress Notes (Deleted)
Cardiology Office Note Date:  10/04/2021  Patient ID:  Amy Wright, DOB [birth year], MRN [id number] PCP:  Angelica ChessmanAguiar, Rafaela M, MD  Electrophysiologist:  Dr. Johney FrameAllred    Chief Complaint:    over due visit  History of Present Illness: Amy Wright is a 81 y.o. female with history of HTN, HLD, DM, anxiety, and AFib.  AFib clinic back in Nov 2019, at that time on Tikosyn, having some paroxysms of Afib, and planned for DCCV.  The pt had not wanted to consider ablation or alternative AAD at that time.  Aug 2020 the patient reached out wanting to switch from coumadin to Eliquis (note mentions coumadin was no longer bing made, presumably branded)  She developed nose bleeds with Eliquis, followed with ENT >> warfarin.  She saw Dr. Johney FrameAllred via Telehealth visit Jan 2021.  Discussed prior failure with Tikosyn and pt did not want to consider amiodarone (her sister having a bad experience with the drug), not an ablation candidate and planned for rate control strategies, her Toprol increased. Mentioned could consider surgical referral for MAZE/LAA ligation though the pt wanted to avoid that.  I saw her April 2022 She feels tired no energy. She mentions that in the last several weeks wakes after sleeping all night gets up and does a few things and once seated again for any length of time falls asleep for hours again. She stopped using CPAP probably 3 years ago, read bad things about the machines and felt like she struggled with it more then she slept with it. Not dizzy or lightheaded, but tired.  No CP, mentions that "sine I have put on this weight" get winded very easily. No rest SOB She has marked brawny type edema that she tells me is not new for her and has a PRN lasix prescription via her PMD that she uses some weeks and not others, her PMD having given her instructions, She was uncertain of the strength of the pills she has. Her PMD is managing her warfarin, no bleeding or signs of bleeding  just had labs done No changes were made, encouraged to exercise, lose weight, and compliance with CPAP. she was on an unusual lasix regime with her PMD Planned to update her echo (NOT DONE)  She saw Dr. Johney FrameAllred Jun 2022, she had ost 20lbs, doing pretty well, noted labile INRs, discussed alternative OAC to Eliquis, she did not want to make changes.  Planned to see EP APP Q 16mo.  *** symptoms *** volume? *** labs, lipids...  June 2022, last labs *** lasix, PMD? *** CPAP *** bleeding, warfarin, we manage??  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 Central jet. Aortic Valve appears to be mildly sclerotic. Trace aortic regurgitation.EF >55%    Past Surgical History:  Procedure Laterality Date   APPENDECTOMY  1971   CARDIOVERSION N/A 06/23/2013   Procedure: CARDIOVERSION;  Surgeon: Lennette Biharihomas A Kelly, MD;  Location: [facility];  Service: Cardiovascular;  Laterality: N/A;   CATARACT EXTRACTION W/ INTRAOCULAR LENS IMPLANT Bilateral    CESAREAN SECTION  1971   PLACEMENT OF BREAST IMPLANTS Bilateral 1985  Current Outpatient Medications  Medication Sig Dispense Refill   ACCU-CHEK COMPACT PLUS test strip CHECK BLOOD SUGAR TWICE DAILY OR AS DIRECTED  0   acetaminophen (TYLENOL) 500 MG tablet Take 1,000 mg by mouth every 6 (six) hours as needed for headache or mild pain (pain).     Ascorbic Acid (VITAMIN C) 1000 MG tablet Take 2,000 mg by mouth daily.     cholecalciferol (VITAMIN D3) 25 MCG (1000 UT) tablet Take 1,000 Units by mouth daily.     diltiazem (CARDIZEM CD) 360 MG 24 hr capsule TAKE 1 CAPSULE BY MOUTH EVERY MORNING. OVERDUE APPT WITH DR. ALLRED BEFORE ANYMORE  REFILLS. 90 capsule 3   ezetimibe (ZETIA) 10 MG tablet Take 10 mg by mouth at bedtime.      folic acid (FOLVITE) 1 MG tablet Take 1 mg by mouth every morning.      furosemide (LASIX) 40 MG tablet Take 40 mg by mouth daily as needed for edema.     glipiZIDE (GLUCOTROL) 10 MG tablet Take 10 mg by mouth every morning.      LORazepam (ATIVAN) 0.5 MG tablet Take 0.5 mg by mouth daily as needed for anxiety (jitters).      magnesium oxide (MAG-OX) 400 MG tablet Take 1 tablet (400 mg total) by mouth daily. 30 tablet 5   Melatonin 3 MG TABS Take 3 mg by mouth at bedtime as needed (sleep).     metFORMIN (GLUCOPHAGE) 1000 MG tablet Take 1,000 mg by mouth 2 (two) times daily with a meal.     metoprolol succinate (TOPROL-XL) 50 MG 24 hr tablet TAKE 2 TABLETS IN THE MORNING. TAKE 1 TABLET AT NIGHT. PLEASE KEEP UPCOMING APPT IN APRIL 2022 BEFORE ANYMORE REFILLS. THANK YOU 270 tablet 3   MYRBETRIQ 50 MG TB24 tablet Take 50 mg by mouth daily.     potassium chloride (KLOR-CON) 10 MEQ tablet TAKE 1 TABLET BY MOUTH EVERY DAY 90 tablet 3   Semaglutide,0.25 or 0.5MG /DOS, (OZEMPIC, 0.25 OR 0.5 MG/DOSE,) 2 MG/1.5ML SOPN Inject 0.5 mg into the skin once a week. Sunday/ Monday     sertraline (ZOLOFT) 100 MG tablet Take 100 mg by mouth every morning.     sodium chloride (OCEAN) 0.65 % nasal spray Place 1 spray into the nose as needed for congestion.     telmisartan (MICARDIS) 80 MG tablet Take 80 mg by mouth daily.     vitamin B-12 (CYANOCOBALAMIN) 1000 MCG tablet Take 1,000 mcg by mouth every morning.     VITAMIN E PO Take 1 capsule by mouth once a week.     warfarin (COUMADIN) 5 MG tablet TAKE 1 TABLET DAILY EXCEPT 1/2 TABLET ON TUESDAYS AND THURSDAYS OR AS DIRECTED 30 tablet 1   zolpidem (AMBIEN) 5 MG tablet Take 5 mg by mouth at bedtime.     No current facility-administered medications for this visit.    Allergies:   Rosuvastatin calcium, Statins, Amoxicillin, Atorvastatin, Ezetimibe-simvastatin, and Penicillins    Social History:  The patient  reports that she has never smoked. She has been exposed to tobacco smoke. She has never used smokeless tobacco. She reports current alcohol use. She reports that she does not use drugs.   Family History:  The patient's family history includes Cancer - Lung in her father; Cancer - Other in her maternal grandmother; Cardiomyopathy in her mother; Diabetes in her maternal grandfather; Heart attack in her mother; Stroke in her paternal grandfather.  ROS:  Please see the history of present  illness.  All other systems are reviewed and otherwise negative.   PHYSICAL EXAM:  VS:  There were no vitals taken for this visit. BMI: There is no height or weight on file to calculate BMI.  O2 sat re-measured 94-95% RA Well nourished, well developed, in no acute distress  HEENT: normocephalic, atraumatic  Neck: no JVD, carotid bruits or masses Cardiac: *** irreg-irreg; no significant murmurs, no rubs, or gallops Lungs:  *** CTA b/l, no wheezing, rhonchi or rales  Abd: soft, nontender MS: no deformity or atrophy Ext:  *** no edema  Skin: warm and dry, no rash Neuro:  No gross deficits appreciated Psych: euthymic mood, full affect   EKG:  Done today and reviewed by myself shows  ***    09/09/2016: TTE Study Conclusions - Left ventricle: The cavity size was normal. There was mild   concentric hypertrophy. Systolic function was normal. The   estimated ejection fraction was in the range of 55% to 60%. Wall   motion was normal; there were no regional wall motion   abnormalities. Features are consistent with a pseudonormal left   ventricular filling pattern, with concomitant abnormal relaxation   and increased filling pressure (grade 2 diastolic dysfunction). - Aortic valve: There was trivial regurgitation. - Mitral valve: Mildly calcified annulus. - Left atrium: The atrium was severely dilated.   Recent Labs: 02/24/2021: ALT 12; BUN 10; Creatinine, Ser 0.57; Hemoglobin  12.8; Platelets 302; Potassium 3.7; Sodium 136  No results found for requested labs within last 8760 hours.   CrCl cannot be calculated (Patient's most recent lab result is older than the maximum 21 days allowed.).   Wt Readings from Last 3 Encounters:  02/27/21 244 lb (110.7 kg)  02/24/21 200 lb (90.7 kg)  01/29/21 231 lb 7.7 oz (105 kg)     Other studies reviewed: Additional studies/records reviewed today include: summarized above  ASSESSMENT AND PLAN:  1. Persistent >>> longstanding persistent AFib     CHA2DS2Vasc is *** 5, on ***     *** Rate controlled  She is unaware of her AF Not an ablation candidate Did not want to pursue surgical strategy   2. HTN     *** Looks OK today   3. Excessive daytime fatigue     *** She mentions for some months 4. DOE     *** She reports a lasix regime she has established with her PMD, I do not see lasix on our list or her PMD med list, she is certain she has and uses it on/off weeks because it tends to deplete her K+     She says now for years. 5. Brawny type edema, no real pitting was appreciated and seems at or near her baseline by her reports     *** She feels like she is more winded in the last 3-4 months, since she has been this heavy              Disposition: ***     Current medicines are reviewed at length with the patient today.  The patient did not have any concerns regarding medicines.  Norma Fredrickson, PA-C 10/04/2021 4:32 AM     [facility] [address] [place] [address] [phone] (office)  [phone] (fax)

## 2021-10-17 ENCOUNTER — Telehealth: Payer: Self-pay

## 2021-10-17 NOTE — Telephone Encounter (Signed)
Lpm to check INR 

## 2021-10-20 LAB — POCT INR: INR: 2.7 (ref 2.0–3.0)

## 2021-10-21 ENCOUNTER — Ambulatory Visit (INDEPENDENT_AMBULATORY_CARE_PROVIDER_SITE_OTHER): Payer: Medicare Other | Admitting: Internal Medicine

## 2021-10-21 DIAGNOSIS — Z5181 Encounter for therapeutic drug level monitoring: Secondary | ICD-10-CM | POA: Diagnosis not present

## 2021-11-05 LAB — POCT INR: INR: 2.7 (ref 2.0–3.0)

## 2021-11-06 ENCOUNTER — Ambulatory Visit (INDEPENDENT_AMBULATORY_CARE_PROVIDER_SITE_OTHER): Payer: Medicare Other | Admitting: Cardiovascular Disease

## 2021-11-06 DIAGNOSIS — Z5181 Encounter for therapeutic drug level monitoring: Secondary | ICD-10-CM | POA: Diagnosis not present

## 2021-11-20 ENCOUNTER — Other Ambulatory Visit: Payer: Self-pay | Admitting: Internal Medicine

## 2021-11-21 ENCOUNTER — Telehealth: Payer: Self-pay

## 2021-11-21 NOTE — Telephone Encounter (Signed)
Reminded patient to check INR.  Verbalized understanding 

## 2021-11-22 ENCOUNTER — Telehealth: Payer: Self-pay

## 2021-11-22 NOTE — Telephone Encounter (Signed)
Lpm to check INR 

## 2021-11-25 ENCOUNTER — Telehealth: Payer: Self-pay

## 2021-11-25 NOTE — Telephone Encounter (Signed)
Lpmt check INR ?

## 2021-11-27 ENCOUNTER — Telehealth: Payer: Self-pay

## 2021-11-27 ENCOUNTER — Other Ambulatory Visit: Payer: Self-pay | Admitting: Internal Medicine

## 2021-11-27 NOTE — Telephone Encounter (Signed)
Lpm to check INR 

## 2021-11-28 ENCOUNTER — Telehealth: Payer: Self-pay

## 2021-11-28 NOTE — Telephone Encounter (Signed)
Lpm to check INR 

## 2021-11-29 ENCOUNTER — Telehealth: Payer: Self-pay

## 2021-11-29 NOTE — Telephone Encounter (Signed)
Lpm to check INR 

## 2021-12-04 ENCOUNTER — Telehealth: Payer: Self-pay

## 2021-12-04 NOTE — Telephone Encounter (Signed)
Lpm to check INR 

## 2021-12-07 ENCOUNTER — Other Ambulatory Visit: Payer: Self-pay | Admitting: Physician Assistant

## 2021-12-11 ENCOUNTER — Telehealth: Payer: Self-pay

## 2021-12-11 NOTE — Telephone Encounter (Signed)
Lpm to check INR 

## 2021-12-12 ENCOUNTER — Telehealth: Payer: Self-pay

## 2021-12-12 NOTE — Telephone Encounter (Signed)
Lpm to check INR 

## 2021-12-16 ENCOUNTER — Other Ambulatory Visit: Payer: Self-pay | Admitting: Physician Assistant

## 2021-12-16 ENCOUNTER — Telehealth: Payer: Self-pay

## 2021-12-16 NOTE — Telephone Encounter (Signed)
Lpm to check INR 

## 2021-12-19 ENCOUNTER — Telehealth: Payer: Self-pay

## 2021-12-19 NOTE — Telephone Encounter (Signed)
Reminded patient to check INR.  Verbalized understanding 

## 2021-12-24 ENCOUNTER — Telehealth: Payer: Self-pay | Admitting: Pharmacist

## 2021-12-24 NOTE — Telephone Encounter (Signed)
Patient called and reported her nose has been bleeding for about 30 minutes.  She has a clip on her nose. She checked her INR: 1.6.  Advised she should have foods with vitamin K such as a can of spinach, continue to apply pressure, and use an ice pack/cold compress. If she can not get bleeding to stop recommend she go to ER. ?

## 2021-12-26 ENCOUNTER — Telehealth: Payer: Self-pay

## 2021-12-26 NOTE — Telephone Encounter (Signed)
Lpm to call back and discuss nose bleed and INR. ?

## 2021-12-31 ENCOUNTER — Emergency Department (HOSPITAL_BASED_OUTPATIENT_CLINIC_OR_DEPARTMENT_OTHER): Payer: Medicare Other

## 2021-12-31 ENCOUNTER — Other Ambulatory Visit: Payer: Self-pay

## 2021-12-31 ENCOUNTER — Emergency Department (HOSPITAL_BASED_OUTPATIENT_CLINIC_OR_DEPARTMENT_OTHER)
Admission: EM | Admit: 2021-12-31 | Discharge: 2022-01-01 | Disposition: A | Discharge status: Home / Self Care | Payer: Medicare Other | Attending: Emergency Medicine | Admitting: Emergency Medicine

## 2021-12-31 ENCOUNTER — Encounter (HOSPITAL_BASED_OUTPATIENT_CLINIC_OR_DEPARTMENT_OTHER): Payer: Self-pay

## 2021-12-31 DIAGNOSIS — Z794 Long term (current) use of insulin: Secondary | ICD-10-CM | POA: Insufficient documentation

## 2021-12-31 DIAGNOSIS — Z7901 Long term (current) use of anticoagulants: Secondary | ICD-10-CM | POA: Insufficient documentation

## 2021-12-31 DIAGNOSIS — I4891 Unspecified atrial fibrillation: Secondary | ICD-10-CM | POA: Diagnosis not present

## 2021-12-31 DIAGNOSIS — Z7984 Long term (current) use of oral hypoglycemic drugs: Secondary | ICD-10-CM | POA: Diagnosis not present

## 2021-12-31 DIAGNOSIS — Z5181 Encounter for therapeutic drug level monitoring: Secondary | ICD-10-CM | POA: Insufficient documentation

## 2021-12-31 DIAGNOSIS — R600 Localized edema: Secondary | ICD-10-CM | POA: Diagnosis present

## 2021-12-31 DIAGNOSIS — E119 Type 2 diabetes mellitus without complications: Secondary | ICD-10-CM | POA: Insufficient documentation

## 2021-12-31 DIAGNOSIS — I10 Essential (primary) hypertension: Secondary | ICD-10-CM | POA: Insufficient documentation

## 2021-12-31 DIAGNOSIS — Z79899 Other long term (current) drug therapy: Secondary | ICD-10-CM | POA: Insufficient documentation

## 2021-12-31 LAB — CBC
HCT: 41.3 % (ref 36.0–46.0)
Hemoglobin: 12.7 g/dL (ref 12.0–15.0)
MCH: 24.7 pg — ABNORMAL LOW (ref 26.0–34.0)
MCHC: 30.8 g/dL (ref 30.0–36.0)
MCV: 80.4 fL (ref 80.0–100.0)
Platelets: 339 10*3/uL (ref 150–400)
RBC: 5.14 MIL/uL — ABNORMAL HIGH (ref 3.87–5.11)
RDW: 17 % — ABNORMAL HIGH (ref 11.5–15.5)
WBC: 8.4 10*3/uL (ref 4.0–10.5)
nRBC: 0 % (ref 0.0–0.2)

## 2021-12-31 LAB — BASIC METABOLIC PANEL
Anion gap: 11 (ref 5–15)
BUN: 19 mg/dL (ref 8–23)
CO2: 24 mmol/L (ref 22–32)
Calcium: 9.7 mg/dL (ref 8.9–10.3)
Chloride: 103 mmol/L (ref 98–111)
Creatinine, Ser: 0.7 mg/dL (ref 0.44–1.00)
GFR, Estimated: >60 mL/min (ref >60)
Glucose, Bld: 133 mg/dL — ABNORMAL HIGH (ref 70–99)
Potassium: 3.5 mmol/L (ref 3.5–5.1)
Sodium: 138 mmol/L (ref 135–145)

## 2021-12-31 LAB — PROTIME-INR
INR: 1.2 (ref 0.8–1.2)
Prothrombin Time: 14.7 seconds (ref 11.4–15.2)

## 2021-12-31 LAB — APTT: aPTT: 37 seconds — ABNORMAL HIGH (ref 24–36)

## 2021-12-31 LAB — D-DIMER, QUANTITATIVE: D-Dimer, Quant: 0.51 ug/mL-FEU — ABNORMAL HIGH (ref 0.00–0.50)

## 2021-12-31 MED ORDER — WARFARIN SODIUM 5 MG PO TABS
5.0000 mg | ORAL_TABLET | Freq: Once | ORAL | Status: AC
  Administered 2022-01-01: 5 mg via ORAL
  Filled 2021-12-31: qty 1

## 2021-12-31 NOTE — ED Provider Notes (Signed)
? ?[facility] ?Provider Note: Amy Spurling, MD, [place] ? ?CSN: [id number] ?MRN: [id number] ?ARRIVAL: 12/31/21 at 2034 ?ROOM: [id number]/[id number] ? ? ?CHIEF COMPLAINT  ?Leg Swelling ? ? ?HISTORY OF PRESENT ILLNESS  ?12/31/21 11:22 PM ?Amy Wright is a 81 y.o. female with chronic lower extremity edema.  She is supposed to be taking Lasix but does not take Lasix because it makes her feel "depleted".  She has had an acute exacerbation of edema and pain in the right lower extremity, notably of the thigh, over the past 2 days.  She rates her pain as a 9 out of 10, throbbing in nature and worse with ambulation.  She is not short of breath with this.  She does have chronic atrial fibrillation on warfarin. ? ? ?Past Medical History:  ?Diagnosis Date  ? Anxiety   ? Chronic insomnia   ? Hyperlipidemia   ? Hypertension   ? Hypertriglyceridemia   ? Obesity   ? OSA on CPAP   ? Persistent atrial fibrillation ([place]) 11/07/14  ? Chads2vasc score of at least 4  ? Type II diabetes mellitus ([place])   ? 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 ECHO-Mitral Valve- mild to moderate mitral regurgitation. Central jet. Aortic Valve appears to be mildly sclerotic. Trace aortic regurgitation.EF >55%  ? ? ?Past Surgical History:  ?Procedure Laterality Date  ? APPENDECTOMY  1971  ? CARDIOVERSION N/A 06/23/2013  ? Procedure: CARDIOVERSION;  Surgeon: Troy Sine, MD;  Location: [place];  Service: Cardiovascular;  Laterality: N/A;  ? CATARACT EXTRACTION W/ INTRAOCULAR LENS IMPLANT Bilateral   ? [place]  ? PLACEMENT OF BREAST IMPLANTS Bilateral 1985  ? ? ?Family History  ?Problem Relation Age of Onset  ? Heart attack Mother   ? Cardiomyopathy Mother   ? Cancer - Lung Father   ? Cancer - Other Maternal Grandmother   ? Stroke Paternal Grandfather   ? Diabetes Maternal Grandfather   ? ? ?Social History  ? ?Tobacco Use  ? Smoking status: Never  ?  Passive exposure: Yes  ? Smokeless tobacco: Never  ?Vaping Use  ? Vaping Use: Never used   ?Substance Use Topics  ? Alcohol use: Yes  ?  Comment: 08/09/2013 "nothing to drink in >4 yrs; never had a problem w/it"  ? Drug use: No  ? ? ?Prior to Admission medications   ?Medication Sig Start Date End Date Taking? Authorizing Provider  ?ACCU-CHEK COMPACT PLUS test strip CHECK BLOOD SUGAR TWICE DAILY OR AS DIRECTED 02/27/16   [provider]  ?acetaminophen (TYLENOL) 500 MG tablet Take 1,000 mg by mouth every 6 (six) hours as needed for headache or mild pain (pain).    [provider]  ?Ascorbic Acid (VITAMIN C) 1000 MG tablet Take 2,000 mg by mouth daily.    [provider]  ?cholecalciferol (VITAMIN D3) 25 MCG (1000 UT) tablet Take 1,000 Units by mouth daily.    [provider]  ?diltiazem (CARDIZEM CD) 360 MG 24 hr capsule TAKE 1 CAPSULE BY MOUTH EVERY MORNING. Please make overdue appt with Dr. Rayann Heman for June 2023 for future refills. Thank you 1st attempt 11/27/21   Thompson Grayer, MD  ?ezetimibe (ZETIA) 10 MG tablet Take 10 mg by mouth at bedtime.     [provider]  ?folic acid (FOLVITE) 1 MG tablet Take 1 mg by mouth every morning.     [provider]  ?furosemide (LASIX) 40 MG tablet Take 40 mg by mouth daily as needed for  edema.    [provider]  ?glipiZIDE (GLUCOTROL) 10 MG tablet Take 10 mg by mouth every morning.  03/24/17   [provider]  ?LORazepam (ATIVAN) 0.5 MG tablet Take 0.5 mg by mouth daily as needed for anxiety (jitters).     [provider]  ?magnesium oxide (MAG-OX) 400 MG tablet Take 1 tablet (400 mg total) by mouth daily. 02/14/16   Patsey Berthold, NP  ?Melatonin 3 MG TABS Take 3 mg by mouth at bedtime as needed (sleep).    [provider]  ?metFORMIN (GLUCOPHAGE) 1000 MG tablet Take 1,000 mg by mouth 2 (two) times daily with a meal.    [provider]  ?metoprolol succinate (TOPROL-XL) 50 MG 24 hr tablet TAKE 2 TABLETS IN THE MORNING. TAKE 1 TABLET AT NIGHT. PLEASE KEEP UPCOMING APPT IN  APRIL 2022 BEFORE ANYMORE REFILLS. THANK YOU 01/14/21   Allred, Jeneen Rinks, MD  ?MYRBETRIQ 50 MG TB24 tablet Take 50 mg by mouth daily. 01/21/21   [provider]  ?potassium chloride (KLOR-CON) 10 MEQ tablet TAKE 1 TABLET BY MOUTH EVERY DAY 01/14/21   Allred, Jeneen Rinks, MD  ?Semaglutide,0.25 or 0.5MG /DOS, (OZEMPIC, 0.25 OR 0.5 MG/DOSE,) 2 MG/1.5ML SOPN Inject 0.5 mg into the skin once a week. Sunday/ Monday    [provider]  ?sertraline (ZOLOFT) 100 MG tablet Take 100 mg by mouth every morning. 02/04/17   [provider]  ?sodium chloride (OCEAN) 0.65 % nasal spray Place 1 spray into the nose as needed for congestion.    [provider]  ?telmisartan (MICARDIS) 80 MG tablet Take 80 mg by mouth daily.    [provider]  ?vitamin B-12 (CYANOCOBALAMIN) 1000 MCG tablet Take 1,000 mcg by mouth every morning.    [provider]  ?VITAMIN E PO Take 1 capsule by mouth once a week.    [provider]  ?warfarin (COUMADIN) 5 MG tablet TAKE 1-2 TABLETS DAILY OR AS PRESCRIBED BY COUMADIN CLINIC 12/09/21   Baldwin Jamaica, PA-C  ?zolpidem (AMBIEN) 5 MG tablet Take 5 mg by mouth at bedtime.    [provider]  ? ? ?Allergies ?Rosuvastatin calcium, Statins, Amoxicillin, Atorvastatin, Ezetimibe-simvastatin, and Penicillins ? ? ?REVIEW OF SYSTEMS  ?Negative except as noted here or in the History of Present Illness. ? ? ?PHYSICAL EXAMINATION  ?Initial Vital Signs ?Blood pressure (!) 190/108, pulse 83, temperature (!) 97 ?F (36.1 ?C), resp. rate 18, height 5\' 5"  (1.651 m), weight 96 kg, SpO2 98 %. ? ?Examination ?General: Well-developed, well-nourished female in no acute distress; appearance consistent with age of record ?HENT: normocephalic; atraumatic ?Eyes: Normal appearance ?Neck: supple ?Heart: Irregular rhythm ?Lungs: clear to auscultation bilaterally ?Abdomen: soft; nondistended; nontender; bowel sounds present ?Extremities: Chronic appearing edema of bilateral lower  extremities with stasis changes: ? ? ? ?Neurologic: Awake, alert and oriented; motor function intact in all extremities and symmetric; no facial droop ?Skin: Warm and dry ?Psychiatric: Normal mood and affect ? ? ?RESULTS  ?Summary of this visit's results, reviewed and interpreted by myself: ? ? EKG Interpretation ? ?Date/Time:    ?Ventricular Rate:    ?PR Interval:    ?QRS Duration:   ?QT Interval:    ?QTC Calculation:   ?R Axis:     ?Text Interpretation:   ?  ? ?  ? ?Laboratory Studies: ?Results for orders placed or performed during the hospital encounter of 12/31/21 (from the past 24 hour(s))  ?Basic metabolic panel     Status: Abnormal  ?  Collection Time: 12/31/21  9:29 PM  ?Result Value Ref Range  ? Sodium 138 135 - 145 mmol/L  ? Potassium 3.5 3.5 - 5.1 mmol/L  ? Chloride 103 98 - 111 mmol/L  ? CO2 24 22 - 32 mmol/L  ? Glucose, Bld 133 (H) 70 - 99 mg/dL  ? BUN 19 8 - 23 mg/dL  ? Creatinine, Ser 0.70 0.44 - 1.00 mg/dL  ? Calcium 9.7 8.9 - 10.3 mg/dL  ? GFR, Estimated >60 >60 mL/min  ? Anion gap 11 5 - 15  ?CBC     Status: Abnormal  ? Collection Time: 12/31/21  9:29 PM  ?Result Value Ref Range  ? WBC 8.4 4.0 - 10.5 K/uL  ? RBC 5.14 (H) 3.87 - 5.11 MIL/uL  ? Hemoglobin 12.7 12.0 - 15.0 g/dL  ? HCT 41.3 36.0 - 46.0 %  ? MCV 80.4 80.0 - 100.0 fL  ? MCH 24.7 (L) 26.0 - 34.0 pg  ? MCHC 30.8 30.0 - 36.0 g/dL  ? RDW 17.0 (H) 11.5 - 15.5 %  ? Platelets 339 150 - 400 K/uL  ? nRBC 0.0 0.0 - 0.2 %  ?APTT     Status: Abnormal  ? Collection Time: 12/31/21  9:29 PM  ?Result Value Ref Range  ? aPTT 37 (H) 24 - 36 seconds  ?D-dimer, quantitative     Status: Abnormal  ? Collection Time: 12/31/21  9:29 PM  ?Result Value Ref Range  ? D-Dimer, Quant 0.51 (H) 0.00 - 0.50 ug/mL-FEU  ?Protime-INR     Status: None  ? Collection Time: 12/31/21  9:29 PM  ?Result Value Ref Range  ? Prothrombin Time 14.7 11.4 - 15.2 seconds  ? INR 1.2 0.8 - 1.2  ? ?Imaging Studies: ?US Venous Img Lower Unilateral Right ? ?Result Date: 12/31/2021 ?CLINICAL  DATA:  Right lower extremity swelling EXAM: RIGHT LOWER EXTREMITY VENOUS DOPPLER ULTRASOUND TECHNIQUE: Gray-scale sonography with compression, as well as color and duplex ultrasound, were performed to evaluate th

## 2021-12-31 NOTE — ED Triage Notes (Signed)
Pt states she developed rt. Leg swelling 3 weeks ago, but in past 2 days has significantly become larger.   ?Pain from rt. calf to groin ? ?

## 2022-01-09 ENCOUNTER — Telehealth: Payer: Self-pay

## 2022-01-09 NOTE — Telephone Encounter (Signed)
Lpm to check INR 

## 2022-01-25 ENCOUNTER — Other Ambulatory Visit: Payer: Self-pay

## 2022-01-25 ENCOUNTER — Emergency Department (HOSPITAL_COMMUNITY): Payer: Medicare Other

## 2022-01-25 ENCOUNTER — Emergency Department (HOSPITAL_COMMUNITY)
Admission: EM | Admit: 2022-01-25 | Discharge: 2022-01-25 | Disposition: A | Discharge status: Home / Self Care | Payer: Medicare Other | Attending: Emergency Medicine | Admitting: Emergency Medicine

## 2022-01-25 DIAGNOSIS — R0600 Dyspnea, unspecified: Secondary | ICD-10-CM | POA: Diagnosis not present

## 2022-01-25 DIAGNOSIS — Z7902 Long term (current) use of antithrombotics/antiplatelets: Secondary | ICD-10-CM | POA: Diagnosis not present

## 2022-01-25 DIAGNOSIS — I890 Lymphedema, not elsewhere classified: Secondary | ICD-10-CM | POA: Diagnosis not present

## 2022-01-25 DIAGNOSIS — Z79899 Other long term (current) drug therapy: Secondary | ICD-10-CM | POA: Insufficient documentation

## 2022-01-25 DIAGNOSIS — R079 Chest pain, unspecified: Secondary | ICD-10-CM | POA: Diagnosis present

## 2022-01-25 DIAGNOSIS — R2243 Localized swelling, mass and lump, lower limb, bilateral: Secondary | ICD-10-CM | POA: Insufficient documentation

## 2022-01-25 LAB — BASIC METABOLIC PANEL
Anion gap: 10 (ref 5–15)
BUN: 15 mg/dL (ref 8–23)
CO2: 22 mmol/L (ref 22–32)
Calcium: 9.5 mg/dL (ref 8.9–10.3)
Chloride: 102 mmol/L (ref 98–111)
Creatinine, Ser: 0.75 mg/dL (ref 0.44–1.00)
GFR, Estimated: >60 mL/min (ref >60)
Glucose, Bld: 287 mg/dL — ABNORMAL HIGH (ref 70–99)
Potassium: 4.3 mmol/L (ref 3.5–5.1)
Sodium: 134 mmol/L — ABNORMAL LOW (ref 135–145)

## 2022-01-25 LAB — CBC
HCT: 42.2 % (ref 36.0–46.0)
Hemoglobin: 13.1 g/dL (ref 12.0–15.0)
MCH: 24.9 pg — ABNORMAL LOW (ref 26.0–34.0)
MCHC: 31 g/dL (ref 30.0–36.0)
MCV: 80.1 fL (ref 80.0–100.0)
Platelets: 393 10*3/uL (ref 150–400)
RBC: 5.27 MIL/uL — ABNORMAL HIGH (ref 3.87–5.11)
RDW: 15.9 % — ABNORMAL HIGH (ref 11.5–15.5)
WBC: 7.8 10*3/uL (ref 4.0–10.5)
nRBC: 0 % (ref 0.0–0.2)

## 2022-01-25 LAB — URINALYSIS, ROUTINE W REFLEX MICROSCOPIC
Bilirubin Urine: NEGATIVE
Glucose, UA: 50 mg/dL — AB
Ketones, ur: 5 mg/dL — AB
Nitrite: NEGATIVE
Protein, ur: 100 mg/dL — AB
Specific Gravity, Urine: 1.019 (ref 1.005–1.030)
pH: 5 (ref 5.0–8.0)

## 2022-01-25 LAB — HEPATIC FUNCTION PANEL
ALT: 17 U/L (ref 0–44)
AST: 25 U/L (ref 15–41)
Albumin: 2.8 g/dL — ABNORMAL LOW (ref 3.5–5.0)
Alkaline Phosphatase: 98 U/L (ref 38–126)
Bilirubin, Direct: 0.2 mg/dL (ref 0.0–0.2)
Indirect Bilirubin: 0.4 mg/dL (ref 0.3–0.9)
Total Bilirubin: 0.6 mg/dL (ref 0.3–1.2)
Total Protein: 7.3 g/dL (ref 6.5–8.1)

## 2022-01-25 LAB — TROPONIN I (HIGH SENSITIVITY)
Troponin I (High Sensitivity): 10 ng/L (ref <18)
Troponin I (High Sensitivity): 10 ng/L (ref <18)

## 2022-01-25 LAB — PROTIME-INR
INR: 1.6 — ABNORMAL HIGH (ref 0.8–1.2)
Prothrombin Time: 18.5 seconds — ABNORMAL HIGH (ref 11.4–15.2)

## 2022-01-25 LAB — BRAIN NATRIURETIC PEPTIDE: B Natriuretic Peptide: 322.4 pg/mL — ABNORMAL HIGH (ref 0.0–100.0)

## 2022-01-25 MED ORDER — ASPIRIN 81 MG PO CHEW
324.0000 mg | CHEWABLE_TABLET | Freq: Once | ORAL | Status: AC
  Administered 2022-01-25: 324 mg via ORAL
  Filled 2022-01-25: qty 4

## 2022-01-25 MED ORDER — NITROGLYCERIN 0.4 MG SL SUBL: 0.4000 mg | SUBLINGUAL_TABLET | SUBLINGUAL | Status: DC | PRN

## 2022-01-25 MED ORDER — OXYCODONE HCL 5 MG PO TABS: 5.0000 mg | ORAL_TABLET | ORAL | 0 refills | Status: DC | PRN

## 2022-01-25 MED ORDER — FUROSEMIDE 10 MG/ML IJ SOLN
40.0000 mg | Freq: Once | INTRAMUSCULAR | Status: AC
  Administered 2022-01-25: 40 mg via INTRAVENOUS
  Filled 2022-01-25: qty 4

## 2022-01-25 MED ORDER — OXYCODONE-ACETAMINOPHEN 5-325 MG PO TABS
1.0000 | ORAL_TABLET | Freq: Once | ORAL | Status: AC
  Administered 2022-01-25: 1 via ORAL
  Filled 2022-01-25: qty 1

## 2022-01-25 NOTE — ED Notes (Signed)
Patient verbalizes understanding of discharge instructions. Opportunity for questioning and answers were provided. Armband removed by staff, pt discharged from ED to home via POV  

## 2022-01-25 NOTE — ED Notes (Signed)
PT O2 WAS 96 % BEFORE AMBULATING AND THEN PT 02 REMAINED BETWEEN 88-91 PERCENT WHILE AMBULATING

## 2022-01-25 NOTE — Discharge Instructions (Addendum)
If you develop recurrent, continued, or worsening chest pain, shortness of breath, fever, vomiting, abdominal or back pain, or any other new/concerning symptoms then return to the ER for evaluation.  

## 2022-01-25 NOTE — ED Provider Notes (Signed)
[facility] EMERGENCY DEPARTMENT Provider Note   CSN: [id number] Arrival date & time: 01/25/22  1705     History  Chief Complaint  Patient presents with   Chest Pain    Amy Wright is a 81 y.o. female.  HPI 81 year old female presents with leg swelling and chest pain.  She states has been having progressive bilateral leg swelling and pain from her lymphedema over the last month or so.  She is supposed to be on Lasix but does not like the way it makes her feel so she estimates she only takes it about 3 times a week.  She has chronic dyspnea when she walks but this is not worsening but also not improving.  However last night she developed some chest pressure that has continued today so she is seeking medical care.  No fevers.  Has been having a cough for the last 3 weeks or so, mostly at night. Chest pain/pressure has been intermittent.  Home Medications Prior to Admission medications   Medication Sig Start Date End Date Taking? Authorizing Provider  oxyCODONE (ROXICODONE) 5 MG immediate release tablet Take 1 tablet (5 mg total) by mouth every 4 (four) hours as needed for severe pain. 01/25/22  Yes Sherwood Gambler, MD  ACCU-CHEK COMPACT PLUS test strip CHECK BLOOD SUGAR TWICE DAILY OR AS DIRECTED 02/27/16   [provider]  acetaminophen (TYLENOL) 500 MG tablet Take 1,000 mg by mouth every 6 (six) hours as needed for headache or mild pain (pain).    [provider]  Ascorbic Acid (VITAMIN C) 1000 MG tablet Take 2,000 mg by mouth daily.    [provider]  cholecalciferol (VITAMIN D3) 25 MCG (1000 UT) tablet Take 1,000 Units by mouth daily.    [provider]  diltiazem (CARDIZEM CD) 360 MG 24 hr capsule TAKE 1 CAPSULE BY MOUTH EVERY MORNING. Please make overdue appt with Dr. Rayann Heman for June 2023 for future refills. Thank you 1st attempt 11/27/21   Thompson Grayer, MD  ezetimibe (ZETIA) 10 MG tablet Take 10 mg by mouth at bedtime.      [provider]  folic acid (FOLVITE) 1 MG tablet Take 1 mg by mouth every morning.     [provider]  furosemide (LASIX) 40 MG tablet Take 40 mg by mouth daily as needed for edema.    [provider]  glipiZIDE (GLUCOTROL) 10 MG tablet Take 10 mg by mouth every morning.  03/24/17   [provider]  LORazepam (ATIVAN) 0.5 MG tablet Take 0.5 mg by mouth daily as needed for anxiety (jitters).     [provider]  magnesium oxide (MAG-OX) 400 MG tablet Take 1 tablet (400 mg total) by mouth daily. 02/14/16   Chanetta Marshall K, NP  Melatonin 3 MG TABS Take 3 mg by mouth at bedtime as needed (sleep).    [provider]  metFORMIN (GLUCOPHAGE) 1000 MG tablet Take 1,000 mg by mouth 2 (two) times daily with a meal.    [provider]  metoprolol succinate (TOPROL-XL) 50 MG 24 hr tablet TAKE 2 TABLETS IN THE MORNING. TAKE 1 TABLET AT NIGHT. PLEASE KEEP UPCOMING APPT IN APRIL 2022 BEFORE ANYMORE REFILLS. THANK YOU 01/14/21   Allred, Jeneen Rinks, MD  MYRBETRIQ 50 MG TB24 tablet Take 50 mg by mouth daily. 01/21/21   [provider]  potassium chloride (KLOR-CON) 10 MEQ tablet TAKE 1 TABLET BY MOUTH EVERY DAY 01/14/21   Thompson Grayer, MD  Semaglutide,0.25 or  0.5MG /DOS, (OZEMPIC, 0.25 OR 0.5 MG/DOSE,) 2 MG/1.5ML SOPN Inject 0.5 mg into the skin once a week. Sunday/ Monday    [provider]  sertraline (ZOLOFT) 100 MG tablet Take 100 mg by mouth every morning. 02/04/17   [provider]  sodium chloride (OCEAN) 0.65 % nasal spray Place 1 spray into the nose as needed for congestion.    [provider]  telmisartan (MICARDIS) 80 MG tablet Take 80 mg by mouth daily.    [provider]  vitamin B-12 (CYANOCOBALAMIN) 1000 MCG tablet Take 1,000 mcg by mouth every morning.    [provider]  VITAMIN E PO Take 1 capsule by mouth once a week.    [provider]  warfarin (COUMADIN) 5 MG tablet TAKE 1-2 TABLETS  DAILY OR AS PRESCRIBED BY COUMADIN CLINIC 12/09/21   Baldwin Jamaica, PA-C  zolpidem (AMBIEN) 5 MG tablet Take 5 mg by mouth at bedtime.    [provider]      Allergies    Rosuvastatin calcium, Statins, Amoxicillin, Atorvastatin, Ezetimibe-simvastatin, and Penicillins    Review of Systems   Review of Systems  Constitutional:  Negative for fever.  Respiratory:  Positive for cough and shortness of breath.   Cardiovascular:  Positive for chest pain and leg swelling.  Gastrointestinal:  Negative for abdominal pain.   Physical Exam Updated Vital Signs BP (!) 156/99   Pulse 70   Temp 98.9 F (37.2 C)   Resp (!) 22   SpO2 96%  Physical Exam Vitals and nursing note reviewed.  Constitutional:      General: She is not in acute distress.    Appearance: She is well-developed. She is obese. She is not ill-appearing or diaphoretic.  HENT:     Head: Normocephalic and atraumatic.  Cardiovascular:     Rate and Rhythm: Normal rate. Rhythm irregular.     Heart sounds: Normal heart sounds.  Pulmonary:     Effort: Pulmonary effort is normal.     Breath sounds: Normal breath sounds.     Comments: No overt wheezing or rales Abdominal:     Palpations: Abdomen is soft.     Tenderness: There is no abdominal tenderness.  Musculoskeletal:     Comments: BLE lymphedema. Some stasis dermatitis. Both legs/feet are warm  Skin:    General: Skin is warm and dry.  Neurological:     Mental Status: She is alert.    ED Results / Procedures / Treatments   Labs (all labs ordered are listed, but only abnormal results are displayed) Labs Reviewed  BASIC METABOLIC PANEL - Abnormal; Notable for the following components:      Result Value   Sodium 134 (*)    Glucose, Bld 287 (*)    All other components within normal limits  CBC - Abnormal; Notable for the following components:   RBC 5.27 (*)    MCH 24.9 (*)    RDW 15.9 (*)    All other components within normal limits  PROTIME-INR -  Abnormal; Notable for the following components:   Prothrombin Time 18.5 (*)    INR 1.6 (*)    All other components within normal limits  BRAIN NATRIURETIC PEPTIDE - Abnormal; Notable for the following components:   B Natriuretic Peptide 322.4 (*)    All other components within normal limits  HEPATIC FUNCTION PANEL - Abnormal; Notable for the following components:   Albumin 2.8 (*)    All other components within normal limits  URINALYSIS, ROUTINE W REFLEX MICROSCOPIC - Abnormal; Notable for the following components:   APPearance HAZY (*)    Glucose, UA 50 (*)    Hgb urine dipstick SMALL (*)    Ketones, ur 5 (*)    Protein, ur 100 (*)    Leukocytes,Ua SMALL (*)    Bacteria, UA MANY (*)    All other components within normal limits  URINE CULTURE  TROPONIN I (HIGH SENSITIVITY)  TROPONIN I (HIGH SENSITIVITY)    EKG EKG Interpretation  Date/Time:  Saturday Jan 25 2022 17:21:47 EDT Ventricular Rate:  80 PR Interval:    QRS Duration: 74 QT Interval:  386 QTC Calculation: 445 R Axis:   79 Text Interpretation: Atrial fibrillation nonspecific ST changes diffusely ST changes are similar to June 2022 Confirmed by Sherwood Gambler [phone]) on 01/25/2022 5:42:45 PM  Radiology DG Chest 2 View  Result Date: 01/25/2022 CLINICAL DATA:  Chest pain EXAM: CHEST - 2 VIEW COMPARISON:  Previous studies including the examination of 01/13/2021 FINDINGS: Transverse diameter of heart is increased. There are no signs of alveolar pulmonary edema or focal pulmonary consolidation. Subtle increased density with linear calcifications in the margin in the right lower lung fields may be related to chest wall attenuation. Similar finding was seen in the previous studies dating as far back as 06/20/2013. IMPRESSION: Cardiomegaly. There are no signs of pulmonary edema or new focal infiltrates. Electronically Signed   By: Elmer Picker M.D.   On: 01/25/2022 17:46    Procedures Procedures    Medications  Ordered in ED Medications  nitroGLYCERIN (NITROSTAT) SL tablet 0.4 mg (has no administration in time range)  aspirin chewable tablet 324 mg (324 mg Oral Given 01/25/22 1855)  furosemide (LASIX) injection 40 mg (40 mg Intravenous Given 01/25/22 2000)  oxyCODONE-acetaminophen (PERCOCET/ROXICET) 5-325 MG per tablet 1 tablet (1 tablet Oral Given 01/25/22 2221)    ED Course/ Medical Decision Making/ A&P                           Medical Decision Making Amount and/or Complexity of Data Reviewed Independent Historian:     Details: daughter External Data Reviewed: notes. Labs: ordered. Radiology: ordered and independent interpretation performed. ECG/medicine tests: ordered and independent interpretation performed.  Risk OTC drugs. Prescription drug management.   Patient primarily presents with lymphedema. This has been ongoing for months. She did have some atypical chest pain, and has 2 negative troponins, benign ECG, and negative Chest xray. I have personally viewed/interpreted the ECG and chest xray and there is no ischemia or edema.  Her INR is subtherapeutic.  Otherwise her electrolytes seem to be okay compared to baseline and her BNP is in a questionable range of 322.  She has no known history of CHF and it seems like this is more of a lymphedema problem.  She is due to see vascular surgery.  This is not an emergent indication for her to see them tonight as family is hoping for.  Otherwise, family originally wanted her admitted and after ambulating her and her sats went to the 88-90 range I did discuss that we could admit her for observation and diuresis and echocardiogram versus continuing diuresis at home.  She does not want to stay in the hospital.  I do not think this is unreasonable as this has been a chronic problem.  Recommend that she more consistently take her Lasix.  There is no obvious infection in her legs.  Family  is also concerned because she is been on multiple antibiotics for the  past several months for dysuria.  Her urine does show large leukocytes and many bacteria but it also seems like she has been diagnosed with interstitial cystitis.  We will send it for a culture but I am not sure she is actually having acute UTI.  Again I had offered her admission but she declines and wants to go home.  She is complaining of a lot of pain in her leg, especially at night.  She just had a DVT ultrasound within the last few weeks that was negative.  I doubt DVT but will give a short course of pain medicine for intractable pain.  Will discharge home with return precautions.        Final Clinical Impression(s) / ED Diagnoses Final diagnoses:  Lymphedema  Nonspecific chest pain    Rx / DC Orders ED Discharge Orders          Ordered    Ambulatory referral to Cardiology       Comments: If you have not heard from the Cardiology office within the next 72 hours please call [phone].   01/25/22 2200    oxyCODONE (ROXICODONE) 5 MG immediate release tablet  Every 4 hours PRN        01/25/22 2202              Sherwood Gambler, MD 01/25/22 2251

## 2022-01-25 NOTE — ED Triage Notes (Signed)
Per EMS pt from home.  CP intermittently of unknown duration.  Pt also reports increased leg swelling and ongoing UTI x 6 months. Hx of afib.  CBG 247.  Took asa at home.  Refused nitro from EMS.  22G LH

## 2022-01-27 ENCOUNTER — Other Ambulatory Visit: Payer: Self-pay | Admitting: *Deleted

## 2022-01-27 DIAGNOSIS — R600 Localized edema: Secondary | ICD-10-CM

## 2022-01-28 LAB — URINE CULTURE: Culture: >=100000 — AB

## 2022-01-28 NOTE — Progress Notes (Deleted)
Office Note     CC:  2nd opinion varicose veins Requesting Provider:  Angelica Chessman, MD  HPI: Amy Wright is a 81 y.o. (July 20, 1941) female who presents at the request of Angelica Chessman, MD for evaluation of ***.   Venous symptoms include: positive if (X) [  ] aching [  ] heavy [  ] tired  [  ] throbbing [  ] burning  [  ] itching [  ]swelling [  ] bleeding [  ] ulcer  Onset/duration:  ***  Occupation:  *** Aggravating factors: (sitting, standing) Alleviating factors: (elevation) Compression:  *** Helps:  *** Pain medications:  *** Previous vein procedures:  *** History of DVT:  ***   The pt *** on a statin for cholesterol management.  The pt *** on a daily aspirin.   Other AC:  *** The pt *** on *** for hypertension.   The pt *** diabetic.  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 Central jet. Aortic Valve appears to be mildly sclerotic. Trace aortic regurgitation.EF >55%    Past Surgical History:  Procedure Laterality Date   APPENDECTOMY  1971   CARDIOVERSION N/A 06/23/2013   Procedure: CARDIOVERSION;  Surgeon: Lennette Bihari, MD;  Location: [facility] ENDOSCOPY;  Service: Cardiovascular;  Laterality: N/A;   CATARACT EXTRACTION W/ INTRAOCULAR LENS IMPLANT Bilateral    CESAREAN SECTION  1971   PLACEMENT OF BREAST IMPLANTS Bilateral 1985    Social History   Socioeconomic History   Marital status: Married    Spouse name: Not on file   Number of children: Not on file   Years of education: Not on file   Highest education level: Not on file  Occupational History   Not on file  Tobacco Use   Smoking status: Never     Passive exposure: Yes   Smokeless tobacco: Never  Vaping Use   Vaping Use: Never used  Substance and Sexual Activity   Alcohol use: Yes    Comment: 08/09/2013 "nothing to drink in >4 yrs; never had a problem w/it"   Drug use: No   Sexual activity: Not Currently  Other Topics Concern   Not on file  Social History Narrative   Not on file   Social Determinants of Health   Financial Resource Strain: Not on file  Food Insecurity: Not on file  Transportation Needs: Not on file  Physical Activity: Not on file  Stress: Not on file  Social Connections: Not on file  Intimate Partner Violence: Not on file   *** Family History  Problem Relation Age of Onset   Heart attack Mother    Cardiomyopathy Mother    Cancer - Lung Father    Cancer - Other Maternal Grandmother    Stroke Paternal Grandfather    Diabetes Maternal Grandfather     Current Outpatient Medications  Medication Sig Dispense Refill   ACCU-CHEK COMPACT PLUS test strip CHECK BLOOD SUGAR TWICE DAILY OR AS DIRECTED  0   acetaminophen (TYLENOL) 500 MG tablet Take 1,000 mg by mouth every 6 (six) hours  as needed for headache or mild pain (pain).     Ascorbic Acid (VITAMIN C) 1000 MG tablet Take 2,000 mg by mouth daily.     cholecalciferol (VITAMIN D3) 25 MCG (1000 UT) tablet Take 1,000 Units by mouth daily.     diltiazem (CARDIZEM CD) 360 MG 24 hr capsule TAKE 1 CAPSULE BY MOUTH EVERY MORNING. Please make overdue appt with Dr. Johney FrameAllred for June 2023 for future refills. Thank you 1st attempt 90 capsule 0   ezetimibe (ZETIA) 10 MG tablet Take 10 mg by mouth at bedtime.      folic acid (FOLVITE) 1 MG tablet Take 1 mg by mouth every morning.      furosemide (LASIX) 40 MG tablet Take 40 mg by mouth daily as needed for edema.     glipiZIDE (GLUCOTROL) 10 MG tablet Take 10 mg by mouth every morning.      LORazepam (ATIVAN) 0.5 MG tablet Take 0.5 mg by mouth daily as needed for anxiety (jitters).      magnesium oxide (MAG-OX) 400 MG  tablet Take 1 tablet (400 mg total) by mouth daily. 30 tablet 5   Melatonin 3 MG TABS Take 3 mg by mouth at bedtime as needed (sleep).     metFORMIN (GLUCOPHAGE) 1000 MG tablet Take 1,000 mg by mouth 2 (two) times daily with a meal.     metoprolol succinate (TOPROL-XL) 50 MG 24 hr tablet TAKE 2 TABLETS IN THE MORNING. TAKE 1 TABLET AT NIGHT. PLEASE KEEP UPCOMING APPT IN APRIL 2022 BEFORE ANYMORE REFILLS. THANK YOU 270 tablet 3   MYRBETRIQ 50 MG TB24 tablet Take 50 mg by mouth daily.     oxyCODONE (ROXICODONE) 5 MG immediate release tablet Take 1 tablet (5 mg total) by mouth every 4 (four) hours as needed for severe pain. 10 tablet 0   potassium chloride (KLOR-CON) 10 MEQ tablet TAKE 1 TABLET BY MOUTH EVERY DAY 90 tablet 3   Semaglutide,0.25 or 0.5MG /DOS, (OZEMPIC, 0.25 OR 0.5 MG/DOSE,) 2 MG/1.5ML SOPN Inject 0.5 mg into the skin once a week. Sunday/ Monday     sertraline (ZOLOFT) 100 MG tablet Take 100 mg by mouth every morning.     sodium chloride (OCEAN) 0.65 % nasal spray Place 1 spray into the nose as needed for congestion.     telmisartan (MICARDIS) 80 MG tablet Take 80 mg by mouth daily.     vitamin B-12 (CYANOCOBALAMIN) 1000 MCG tablet Take 1,000 mcg by mouth every morning.     VITAMIN E PO Take 1 capsule by mouth once a week.     warfarin (COUMADIN) 5 MG tablet TAKE 1-2 TABLETS DAILY OR AS PRESCRIBED BY COUMADIN CLINIC 30 tablet 1   zolpidem (AMBIEN) 5 MG tablet Take 5 mg by mouth at bedtime.     No current facility-administered medications for this visit.    Allergies  Allergen Reactions   Rosuvastatin Calcium Other (See Comments)    myalgias    Statins Other (See Comments)    myalgia   Amoxicillin Rash   Atorvastatin Other (See Comments)    Back pain    Ezetimibe-Simvastatin Other (See Comments)    Back pain    Penicillins Itching, Swelling and Rash    Did it involve swelling of the face/tongue/throat, SOB, or low BP? No Did it involve sudden or severe rash/hives, skin  peeling, or any reaction on the inside of your mouth or nose? Yes Did you need to seek medical attention at a hospital or doctor's  office? No When did it last happen?   young adult    If all above answers are "NO", may proceed with cephalosporin use.     REVIEW OF SYSTEMS:  *** [X]  denotes positive finding, [ ]  denotes negative finding Cardiac  Comments:  Chest pain or chest pressure:    Shortness of breath upon exertion:    Short of breath when lying flat:    Irregular heart rhythm:        Vascular    Pain in calf, thigh, or hip brought on by ambulation:    Pain in feet at night that wakes you up from your sleep:     Blood clot in your veins:    Leg swelling:         Pulmonary    Oxygen at home:    Productive cough:     Wheezing:         Neurologic    Sudden weakness in arms or legs:     Sudden numbness in arms or legs:     Sudden onset of difficulty speaking or slurred speech:    Temporary loss of vision in one eye:     Problems with dizziness:         Gastrointestinal    Blood in stool:     Vomited blood:         Genitourinary    Burning when urinating:     Blood in urine:        Psychiatric    Major depression:         Hematologic    Bleeding problems:    Problems with blood clotting too easily:        Skin    Rashes or ulcers:        Constitutional    Fever or chills:      PHYSICAL EXAMINATION:  There were no vitals filed for this visit.  General:  WDWN in NAD; vital signs documented above Gait: Not observed HENT: WNL, normocephalic Pulmonary: normal non-labored breathing , without Rales, rhonchi,  wheezing Cardiac: {Desc; regular/irreg:14544} HR, without  Murmurs {With/Without:20273} carotid bruit*** Abdomen: soft, NT, no masses Skin: {With/Without:20273} rashes Vascular Exam/Pulses:  Right Left  Radial {Exam; arterial pulse strength 0-4:30167} {Exam; arterial pulse strength 0-4:30167}  Ulnar {Exam; arterial pulse strength 0-4:30167} {Exam;  arterial pulse strength 0-4:30167}  Femoral {Exam; arterial pulse strength 0-4:30167} {Exam; arterial pulse strength 0-4:30167}  Popliteal {Exam; arterial pulse strength 0-4:30167} {Exam; arterial pulse strength 0-4:30167}  DP {Exam; arterial pulse strength 0-4:30167} {Exam; arterial pulse strength 0-4:30167}  PT {Exam; arterial pulse strength 0-4:30167} {Exam; arterial pulse strength 0-4:30167}   Extremities: {With/Without:20273} ischemic changes, {With/Without:20273} Gangrene , {With/Without:20273} cellulitis; {With/Without:20273} open wounds;  Musculoskeletal: no muscle wasting or atrophy  Neurologic: A&O X 3;  No focal weakness or paresthesias are detected Psychiatric:  The pt has {Desc; normal/abnormal:11317::"Normal"} affect.   Non-Invasive Vascular Imaging:   ***    ASSESSMENT/PLAN:: 81 y.o. female presenting with ***   ***   [phone], MD [facility] [phone]

## 2022-01-29 ENCOUNTER — Telehealth: Payer: Self-pay

## 2022-01-29 NOTE — Progress Notes (Signed)
ED Antimicrobial Stewardship Positive Culture Follow Up   Amy Wright is an 81 y.o. female who presented to [facility] on 01/25/2022 with a chief complaint of leg swelling and chest pain. Of note, non-adherent to lasix.    Recent Results (from the past 720 hour(s))  Urine Culture     Status: Abnormal   Collection Time: 01/25/22  8:34 PM   Specimen: Urine, Clean Catch  Result Value Ref Range Status   Specimen Description URINE, CLEAN CATCH  Final   Special Requests NONE  Final   Culture (A)  Final    >=100,000 COLONIES/mL KLEBSIELLA PNEUMONIAE Two isolates with different morphologies were identified as the same organism.The most resistant organism was reported. Performed at [facility], [place] [address], [place], [place] [address]    Report Status 01/28/2022 FINAL  Final   Organism ID, Bacteria KLEBSIELLA PNEUMONIAE (A)  Final      Susceptibility   Klebsiella pneumoniae - MIC*    AMPICILLIN >=32 RESISTANT Resistant     CEFAZOLIN <=4 SENSITIVE Sensitive     CEFEPIME <=0.12 SENSITIVE Sensitive     CEFTRIAXONE <=0.25 SENSITIVE Sensitive     CIPROFLOXACIN <=0.25 SENSITIVE Sensitive     GENTAMICIN <=1 SENSITIVE Sensitive     IMIPENEM <=0.25 SENSITIVE Sensitive     NITROFURANTOIN 128 RESISTANT Resistant     TRIMETH/SULFA <=20 SENSITIVE Sensitive     AMPICILLIN/SULBACTAM 8 SENSITIVE Sensitive     PIP/TAZO <=4 SENSITIVE Sensitive     * >=100,000 COLONIES/mL KLEBSIELLA PNEUMONIAE   Asymptomatic bacteruria. No antibiotics indicated. Patient previously diagnosed with interstitial cystitis.   ED Provider: Caryl Ada, PA-C   [name] 01/29/2022, 9:13 AM Clinical Pharmacist Monday - Friday phone -  [phone] Saturday - Sunday phone - [phone]

## 2022-01-29 NOTE — Telephone Encounter (Signed)
Post ED Visit - Positive Culture Follow-up  Culture report reviewed by antimicrobial stewardship pharmacist: [facility] [x]  , Pharm.D. []  Valeda Malm, Pharm.D., BCPS AQ-ID []  , Pharm.D., BCPS []  Celedonio Miyamoto, Pharm.D., BCPS []  [place], Garvin Fila.D., BCPS, AAHIVP []  , Pharm.D., BCPS, AAHIVP []  Georgina Pillion, PharmD, BCPS []  , PharmD, BCPS []  [place], PharmD, BCPS []  [address], PharmD []  , PharmD, BCPS []  Estella Husk, PharmD  Pharmacy Team []  Lysle Pearl, PharmD []  , PharmD []  Phillips Climes, PharmD []  , Rph []  Agapito Games) , PharmD []  Verlan Friends, PharmD []  , PharmD []  Mervyn Gay, PharmD []  , PharmD []  Vinnie Level, PharmD []  [facility], PharmD []  , PharmD []  Len Childs, PharmD   Positive urine culture Not treated, asymptomatic bacteriemia, no antibiotics needed and no further patient follow-up is required at this time.  01/29/2022, 12:44 PM

## 2022-01-30 ENCOUNTER — Encounter: Payer: Medicare Other | Admitting: Vascular Surgery

## 2022-01-30 ENCOUNTER — Encounter (HOSPITAL_COMMUNITY): Payer: Medicare Other

## 2022-02-02 ENCOUNTER — Encounter (HOSPITAL_COMMUNITY): Payer: Self-pay | Admitting: Emergency Medicine

## 2022-02-02 ENCOUNTER — Other Ambulatory Visit: Payer: Self-pay

## 2022-02-02 ENCOUNTER — Emergency Department (HOSPITAL_COMMUNITY)
Admission: EM | Admit: 2022-02-02 | Discharge: 2022-02-02 | Disposition: A | Discharge status: Home / Self Care | Payer: Medicare Other | Attending: Emergency Medicine | Admitting: Emergency Medicine

## 2022-02-02 DIAGNOSIS — I890 Lymphedema, not elsewhere classified: Secondary | ICD-10-CM | POA: Insufficient documentation

## 2022-02-02 DIAGNOSIS — Z7901 Long term (current) use of anticoagulants: Secondary | ICD-10-CM | POA: Diagnosis not present

## 2022-02-02 DIAGNOSIS — Z7984 Long term (current) use of oral hypoglycemic drugs: Secondary | ICD-10-CM | POA: Diagnosis not present

## 2022-02-02 DIAGNOSIS — I10 Essential (primary) hypertension: Secondary | ICD-10-CM | POA: Insufficient documentation

## 2022-02-02 DIAGNOSIS — R040 Epistaxis: Secondary | ICD-10-CM | POA: Diagnosis not present

## 2022-02-02 DIAGNOSIS — Z79899 Other long term (current) drug therapy: Secondary | ICD-10-CM | POA: Diagnosis not present

## 2022-02-02 LAB — CBC
HCT: 38.8 % (ref 36.0–46.0)
Hemoglobin: 11.9 g/dL — ABNORMAL LOW (ref 12.0–15.0)
MCH: 24.9 pg — ABNORMAL LOW (ref 26.0–34.0)
MCHC: 30.7 g/dL (ref 30.0–36.0)
MCV: 81.3 fL (ref 80.0–100.0)
Platelets: 353 10*3/uL (ref 150–400)
RBC: 4.77 MIL/uL (ref 3.87–5.11)
RDW: 15.8 % — ABNORMAL HIGH (ref 11.5–15.5)
WBC: 8 10*3/uL (ref 4.0–10.5)
nRBC: 0 % (ref 0.0–0.2)

## 2022-02-02 LAB — PROTIME-INR
INR: 1.6 — ABNORMAL HIGH (ref 0.8–1.2)
Prothrombin Time: 19.1 seconds — ABNORMAL HIGH (ref 11.4–15.2)

## 2022-02-02 LAB — BASIC METABOLIC PANEL
Anion gap: 8 (ref 5–15)
BUN: 16 mg/dL (ref 8–23)
CO2: 24 mmol/L (ref 22–32)
Calcium: 9.1 mg/dL (ref 8.9–10.3)
Chloride: 105 mmol/L (ref 98–111)
Creatinine, Ser: 0.7 mg/dL (ref 0.44–1.00)
GFR, Estimated: >60 mL/min (ref >60)
Glucose, Bld: 291 mg/dL — ABNORMAL HIGH (ref 70–99)
Potassium: 4.1 mmol/L (ref 3.5–5.1)
Sodium: 137 mmol/L (ref 135–145)

## 2022-02-02 MED ORDER — DILTIAZEM HCL ER COATED BEADS 360 MG PO CP24
360.0000 mg | ORAL_CAPSULE | Freq: Every day | ORAL | Status: DC
Start: 2022-02-02 — End: 2022-02-02
  Administered 2022-02-02: 360 mg via ORAL
  Filled 2022-02-02: qty 1

## 2022-02-02 MED ORDER — IRBESARTAN 150 MG PO TABS
150.0000 mg | ORAL_TABLET | Freq: Every day | ORAL | Status: DC
Start: 2022-02-02 — End: 2022-02-02
  Administered 2022-02-02: 150 mg via ORAL
  Filled 2022-02-02: qty 1

## 2022-02-02 MED ORDER — LIDOCAINE-EPINEPHRINE (PF) 2 %-1:200000 IJ SOLN
INTRAMUSCULAR | Status: AC
  Filled 2022-02-02: qty 20

## 2022-02-02 MED ORDER — METOPROLOL SUCCINATE ER 25 MG PO TB24
50.0000 mg | ORAL_TABLET | ORAL | Status: AC
  Administered 2022-02-02: 50 mg via ORAL
  Filled 2022-02-02: qty 2

## 2022-02-02 NOTE — ED Provider Notes (Signed)
[facility] EMERGENCY DEPARTMENT Provider Note   CSN: [id number] Arrival date & time: 02/02/22  [id number]     History  Chief Complaint  Patient presents with   Epistaxis    Amy Wright is a 81 y.o. female.   Epistaxis   Nose bleed -has been seen by emergency providers as well as ENT in the past and required packing, required cautery, intermittent success.  Had recurrent nosebleed this morning which was spontaneous, seems to reduce and improve with pressure of anterior nare Started this morning - when in bed No picking - using zyrtec occasionally No trauma, hx of nose bleeds X 5 in last few years Tried afrin with EMS, htn on their arrival.  Home Medications Prior to Admission medications   Medication Sig Start Date End Date Taking? Authorizing Provider  ACCU-CHEK COMPACT PLUS test strip CHECK BLOOD SUGAR TWICE DAILY OR AS DIRECTED 02/27/16   [provider]  acetaminophen (TYLENOL) 500 MG tablet Take 1,000 mg by mouth every 6 (six) hours as needed for headache or mild pain (pain).    [provider]  Ascorbic Acid (VITAMIN C) 1000 MG tablet Take 2,000 mg by mouth daily.    [provider]  cholecalciferol (VITAMIN D3) 25 MCG (1000 UT) tablet Take 1,000 Units by mouth daily.    [provider]  diltiazem (CARDIZEM CD) 360 MG 24 hr capsule TAKE 1 CAPSULE BY MOUTH EVERY MORNING. Please make overdue appt with Dr. Rayann Heman for June 2023 for future refills. Thank you 1st attempt 11/27/21   Thompson Grayer, MD  ezetimibe (ZETIA) 10 MG tablet Take 10 mg by mouth at bedtime.     [provider]  folic acid (FOLVITE) 1 MG tablet Take 1 mg by mouth every morning.     [provider]  furosemide (LASIX) 40 MG tablet Take 40 mg by mouth daily as needed for edema.    [provider]  glipiZIDE (GLUCOTROL) 10 MG tablet Take 10 mg by mouth every morning.  03/24/17   [provider]  LORazepam (ATIVAN) 0.5 MG tablet  Take 0.5 mg by mouth daily as needed for anxiety (jitters).     [provider]  magnesium oxide (MAG-OX) 400 MG tablet Take 1 tablet (400 mg total) by mouth daily. 02/14/16   Chanetta Marshall K, NP  Melatonin 3 MG TABS Take 3 mg by mouth at bedtime as needed (sleep).    [provider]  metFORMIN (GLUCOPHAGE) 1000 MG tablet Take 1,000 mg by mouth 2 (two) times daily with a meal.    [provider]  metoprolol succinate (TOPROL-XL) 50 MG 24 hr tablet TAKE 2 TABLETS IN THE MORNING. TAKE 1 TABLET AT NIGHT. PLEASE KEEP UPCOMING APPT IN APRIL 2022 BEFORE ANYMORE REFILLS. THANK YOU 01/14/21   Allred, Jeneen Rinks, MD  MYRBETRIQ 50 MG TB24 tablet Take 50 mg by mouth daily. 01/21/21   [provider]  oxyCODONE (ROXICODONE) 5 MG immediate release tablet Take 1 tablet (5 mg total) by mouth every 4 (four) hours as needed for severe pain. 01/25/22   Sherwood Gambler, MD  potassium chloride (KLOR-CON) 10 MEQ tablet TAKE 1 TABLET BY MOUTH EVERY DAY 01/14/21   Allred, Jeneen Rinks, MD  Semaglutide,0.25 or 0.5MG /DOS, (OZEMPIC, 0.25 OR 0.5 MG/DOSE,) 2 MG/1.5ML SOPN Inject 0.5 mg into the skin once a week. Sunday/ Monday    [provider]  sertraline (ZOLOFT) 100 MG tablet Take 100 mg by mouth every morning. 02/04/17   [provider]  sodium chloride (OCEAN) 0.65 % nasal spray Place 1 spray into the nose as needed for congestion.    [provider]  telmisartan (MICARDIS) 80 MG tablet Take 80 mg by mouth daily.    [provider]  vitamin B-12 (CYANOCOBALAMIN) 1000 MCG tablet Take 1,000 mcg by mouth every morning.    [provider]  VITAMIN E PO Take 1 capsule by mouth once a week.    [provider]  warfarin (COUMADIN) 5 MG tablet TAKE 1-2 TABLETS DAILY OR AS PRESCRIBED BY COUMADIN CLINIC 12/09/21   Baldwin Jamaica, PA-C  zolpidem (AMBIEN) 5 MG tablet Take 5 mg by mouth at bedtime.    [provider]      Allergies    Rosuvastatin  calcium, Statins, Amoxicillin, Atorvastatin, Ezetimibe-simvastatin, and Penicillins    Review of Systems   Review of Systems  HENT:  Positive for nosebleeds.    Physical Exam Updated Vital Signs BP (!) 155/115 (BP Location: Right Arm)   Pulse (!) 105   Temp 98.4 F (36.9 C) (Oral)   Resp 15   Ht 1.651 m (5\' 5" )   Wt 96.2 kg   SpO2 94%   BMI 35.28 kg/m  Physical Exam Vitals and nursing note reviewed.  Constitutional:      Appearance: She is well-developed. She is not diaphoretic.  HENT:     Head: Normocephalic and atraumatic.     Nose:     Comments: Heavy nose bleed on left - OP with blood, large clot in nare. Eyes:     General:        Right eye: No discharge.        Left eye: No discharge.     Conjunctiva/sclera: Conjunctivae normal.  Pulmonary:     Effort: Pulmonary effort is normal. No respiratory distress.  Musculoskeletal:     Right lower leg: Edema present.     Left lower leg: Edema present.     Comments: Known lymphedema  Skin:    General: Skin is warm and dry.     Findings: No erythema or rash.  Neurological:     Mental Status: She is alert.     Coordination: Coordination normal.    ED Results / Procedures / Treatments   Labs (all labs ordered are listed, but only abnormal results are displayed) Labs Reviewed  PROTIME-INR - Abnormal; Notable for the following components:      Result Value   Prothrombin Time 19.1 (*)    INR 1.6 (*)    All other components within normal limits  CBC - Abnormal; Notable for the following components:   Hemoglobin 11.9 (*)    MCH 24.9 (*)    RDW 15.8 (*)    All other components within normal limits  BASIC METABOLIC PANEL - Abnormal; Notable for the following components:   Glucose, Bld 291 (*)    All other components within normal limits    EKG None  Radiology No results found.  Procedures .Epistaxis Management  Date/Time: 02/02/2022 7:14 AM Performed by: Noemi Chapel, MD Authorized by: Noemi Chapel, MD    Consent:    Consent obtained:  Verbal   Consent given by:  Patient   Risks, benefits, and alternatives were discussed: yes     Risks discussed:  Bleeding, nasal injury and pain   Alternatives discussed:  Alternative treatment and no treatment Universal protocol:    Procedure explained and questions answered to patient or proxy's satisfaction: yes  Required blood products, implants, devices, and special equipment available: yes     Immediately prior to procedure, a time out was called: yes     Patient identity confirmed:  Verbally with patient Anesthesia:    Anesthesia method:  Topical application   Topical anesthetic:  Epinephrine Procedure details:    Treatment site:  L anterior   Treatment method:  Anterior pack   Treatment complexity:  Limited   Treatment episode: initial   Post-procedure details:    Assessment:  Bleeding decreased   Procedure completion:  Tolerated well, no immediate complications Comments:          Medications Ordered in ED Medications  lidocaine-EPINEPHrine (XYLOCAINE W/EPI) 2 %-1:200000 (PF) injection (has no administration in time range)  diltiazem (CARDIZEM CD) 24 hr capsule 360 mg (360 mg Oral Given 02/02/22 0936)  irbesartan (AVAPRO) tablet 150 mg (150 mg Oral Given 02/02/22 0936)  metoprolol succinate (TOPROL-XL) 24 hr tablet 50 mg (50 mg Oral Given 02/02/22 [id number])    ED Course/ Medical Decision Making/ A&P Clinical Course as of 02/02/22 1005  Sun Feb 02, 2022  0731 RDW(!): 15.8 [BM]    Clinical Course User Index [BM] Noemi Chapel, MD                           Medical Decision Making Amount and/or Complexity of Data Reviewed Labs: ordered. Decision-making details documented in ED Course.  Risk Prescription drug management.   Nose packed with cotton ball / epi / lidocaine Pt with BP [id number] systolic Check INR - (checks at home), Took extra dose this week. Normal MS.  After removing cotton packing bleeding still present, anterior  5.5 cm inflatable pack placed with resolution of bleeding, INR 1.6, hemoglobin 11.9, blood pressure will be rechecked shortly, patient has had resolution of bleeding but will need short observatory period, agreeable to the plan  I have discussed with the patient at the bedside the results, and the meaning of these results.  They have expressed her understanding to the need for follow-up with primary care physician  I personally reevaluated this patient multiple times after packing was placed, there was no recurrent bleeding either anterior or posterior afterwards other than a small amount that was absorbed by the packing material.  She is hemodynamically stable, her blood pressure has improved significantly and is now 155/115 which is a marked improvement from her arrival blood pressure.  She was given her home blood pressure medications.  INR was 1.6, I feel comfortable letting this patient go home with her normal medications and following up with ENT.  Patient agreed        Final Clinical Impression(s) / ED Diagnoses Final diagnoses:  Left-sided epistaxis    Rx / DC Orders ED Discharge Orders     None         Noemi Chapel, MD 02/02/22 1005

## 2022-02-02 NOTE — Discharge Instructions (Signed)
The bleeding seems to have stopped, there may still be a small amount of blood that comes out around the packing today.  Do not be concerned about this unless the bleeding is very heavy and will not stop or if it goes down your throat.  The packing in your nose will need to stay in for 3 or 4 days.  Call the office to make an appointment to see Dr. Lorelee Cover or the ear nose and throat doctor of your choice.

## 2022-02-02 NOTE — ED Triage Notes (Addendum)
Pt brought to ED by [facility] with c/o uncontrolled nose bleed x1.5hr. Pt endorses taking coumadin daily. States she was lying in bed when she began feeling blood drop. Pt also states she has hx of recurrent nosebleeds and has had "surgery in the ED" to fix it.  Afrin x2 sprays administered by EMS with litter relief.   EMS Vitals BP 210/80 Pulse 110 SPO2 936% RA

## 2022-02-02 NOTE — ED Provider Notes (Signed)
I was asked to go see the patient after she was brought to the lobby to await a ride, she is extremely upset that she was not kept in her room until family came to get her.  I told her that the bleeding has essentially resolved and there is nothing else that we can do for her, she is now in a triage room awaiting family's arrival.  I have discussed her case with Dr. Benjamine Mola of the ear nose and throat service who is totally agreeable with the plan of treatment and the follow-up plan, he has advised that the treatment was exact and the same thing that he would do.  The patient still has a small amount of blood that she has dabbing with a Kleenex but nothing is dripping, this is expected and the normal post epistaxis treatment evolution.  Blood pressure improved significantly by the time of discharge, the patient is very well-appearing and though she is unhappy with the circumstances is very stable for discharge.  I tried to discuss with the patient that we needed the treatment room for other patients but she was nonamenable to this discussion   Noemi Chapel, MD 02/02/22 1148

## 2022-02-06 ENCOUNTER — Telehealth: Payer: Self-pay

## 2022-02-06 NOTE — Telephone Encounter (Signed)
Lpm to check INR 

## 2022-02-08 ENCOUNTER — Other Ambulatory Visit: Payer: Self-pay | Admitting: Physician Assistant

## 2022-02-10 NOTE — Telephone Encounter (Signed)
Prescription refill request received for warfarin Lov: 02/27/21 (Allred)  Next INR check: 11/06/21 OVERDUE Warfarin tablet strength: 5mg   Pt overdue for anticoagulation visit. Called, no answer.

## 2022-02-25 NOTE — Progress Notes (Deleted)
Cardiology Office Note:    Date:  02/25/2022   ID:  Amy Wright, DOB November 26, 1940, MRN [id number]  PCP:  Amy Chessman, MD   [facility] Providers Cardiologist:  None  Referring MD: Amy Loveless, MD    History of Present Illness:    Amy Wright is a 81 y.o. female with a hx of HTN, HLD, OSA on CPAP, permanent Afib, DMII and anxiety who is followed by Dr. Johney Wright who presents to clinic for LE edema  Patient follows with Dr. Johney Wright for underlying Afib with last visit in 02/2021 where she was doing well. TTE 2018 with LVEF 55-60%, G2DD, severe LAE. She declined DOAC at that time and continued on warfarin.  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 Central jet. Aortic Valve appears to be mildly sclerotic. Trace aortic regurgitation.EF >55%    Past Surgical History:  Procedure Laterality Date   APPENDECTOMY  1971   CARDIOVERSION N/A 06/23/2013   Procedure: CARDIOVERSION;  Surgeon: Lennette Bihari, MD;  Location: [facility] ENDOSCOPY;  Service: Cardiovascular;  Laterality: N/A;   CATARACT EXTRACTION W/ INTRAOCULAR LENS IMPLANT Bilateral    CESAREAN SECTION  1971   PLACEMENT OF BREAST IMPLANTS Bilateral 1985    Current Medications: No outpatient medications have been marked as taking for the 02/27/22 encounter (Appointment) with Amy Sprague, MD.     Allergies:   Rosuvastatin calcium, Statins, Amoxicillin, Atorvastatin, Ezetimibe-simvastatin, and Penicillins   Social History   Socioeconomic History   Marital status: Married    Spouse name: Not on file   Number of children: Not on file   Years of education: Not on file   Highest  education level: Not on file  Occupational History   Not on file  Tobacco Use   Smoking status: Never    Passive exposure: Yes   Smokeless tobacco: Never  Vaping Use   Vaping Use: Never used  Substance and Sexual Activity   Alcohol use: Yes    Comment: 08/09/2013 "nothing to drink in >4 yrs; never had a problem w/it"   Drug use: No   Sexual activity: Not Currently  Other Topics Concern   Not on file  Social History Narrative   Not on file   Social Determinants of Health   Financial Resource Strain: Not on file  Food Insecurity: Not on file  Transportation Needs: Not on file  Physical Activity: Not on file  Stress: Not on file  Social Connections: Not on file     Family History: The patient's ***family history includes Cancer - Lung in her father; Cancer - Other in her maternal grandmother; Cardiomyopathy in her mother; Diabetes in her maternal grandfather; Heart attack in her mother; Stroke in her paternal grandfather.  ROS:   Please see the history of present illness.    *** All other systems reviewed and are negative.  EKGs/Labs/Other Studies Reviewed:    The following studies were reviewed today: TTE 2016/10/06: Study Conclusions   - Left ventricle: The cavity size was normal. There was mild    concentric hypertrophy. Systolic function was normal. The  estimated ejection fraction was in the range of 55% to 60%. Wall    motion was normal; there were no regional wall motion    abnormalities. Features are consistent with a pseudonormal left    ventricular filling pattern, with concomitant abnormal relaxation    and increased filling pressure (grade 2 diastolic dysfunction).  - Aortic valve: There was trivial regurgitation.  - Mitral valve: Mildly calcified annulus.  - Left atrium: The atrium was severely dilated.   EKG:  EKG is *** ordered today.  The ekg ordered today demonstrates ***  Recent Labs: 01/25/2022: ALT 17; B Natriuretic Peptide 322.4 02/02/2022: BUN  16; Creatinine, Ser 0.70; Hemoglobin 11.9; Platelets 353; Potassium 4.1; Sodium 137  Recent Lipid Panel    Component Value Date/Time   CHOL 232 (H) 03/23/2013 1105   TRIG 486 (H) 03/23/2013 1105   HDL 42 03/23/2013 1105   LDLCALC NOT CALC 03/23/2013 1105     Risk Assessment/Calculations:   {Does this patient have ATRIAL FIBRILLATION?[phone]}       Physical Exam:    VS:  There were no vitals taken for this visit.    Wt Readings from Last 3 Encounters:  02/02/22 212 lb (96.2 kg)  12/31/21 211 lb 10.3 oz (96 kg)  02/27/21 244 lb (110.7 kg)     GEN: *** Well nourished, well developed in no acute distress HEENT: Normal NECK: No JVD; No carotid bruits LYMPHATICS: No lymphadenopathy CARDIAC: ***RRR, no murmurs, rubs, gallops RESPIRATORY:  Clear to auscultation without rales, wheezing or rhonchi  ABDOMEN: Soft, non-tender, non-distended MUSCULOSKELETAL:  No edema; No deformity  SKIN: Warm and dry NEUROLOGIC:  Alert and oriented x 3 PSYCHIATRIC:  Normal affect   ASSESSMENT:    No diagnosis found. PLAN:    In order of problems listed above:  #LE edema: Last TTE in 2018 with LVEF 55-60%, G2DD.  -*** lasix  #Permanent Afib: Followed by Dr. Johney Wright. Rate controlled and minimally symptomatic. CHADs-vasc 5. Declined DOAC as she did not tolerate apixaban. Has been continued on warfarin. -Continue dilt 360mg  daily -Continue warfarin -Declined DOAC or watchman  #HTN: -Continue dilt 360mg  daily -Continue telmisartan 80mg  daily  #HLD: #Statin Intolerance: -Continue zetia 10mg  daily  #DMII: -Management per PCP  #OSA on CPAP      {Are you ordering a CV Procedure (e.g. stress test, cath, DCCV, TEE, etc)?   Press F2        :    Medication Adjustments/Labs and Tests Ordered: Current medicines are reviewed at length with the patient today.  Concerns regarding medicines are outlined above.  No orders of the defined types were placed in this  encounter.  No orders of the defined types were placed in this encounter.   There are no Patient Instructions on file for this visit.   Signed, , MD  02/25/2022 7:48 PM     [facility]

## 2022-02-27 ENCOUNTER — Telehealth: Payer: Self-pay

## 2022-02-27 ENCOUNTER — Ambulatory Visit: Payer: Medicare Other | Admitting: Cardiology

## 2022-02-27 NOTE — Progress Notes (Incomplete)
Cardiology Office Note:    Date:  02/27/2022   ID:  Amy Wright, DOB 1941/05/13, MRN [id number]  PCP:  Angelica Chessman, MD   [facility] Providers Cardiologist:  None  Referring MD: Pricilla Loveless, MD    History of Present Illness:    Amy Wright is a 81 y.o. female with a hx of HTN, HLD, OSA on CPAP, permanent Afib, DMII and anxiety who is followed by Dr. Johney Frame who presented to clinic for LE edema. She is here for the evaluation of lymphedema per ***Jen R.   Patient follows with Dr. Johney Frame for underlying Afib with last visit in 02/2021 where she was doing well. TTE 2018 with LVEF 55-60%, G2DD, severe LAE. She declined DOAC at that time and continued on warfarin.    Today, ***  The patient denies chest pain***, shortness of breath***, nocturnal dyspnea***, orthopnea*** or peripheral edema***.  There have been no palpitations***, lightheadedness*** or syncope***.  Complains of ***.   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 Central jet. Aortic Valve appears to be mildly sclerotic. Trace aortic regurgitation.EF >55%    Past Surgical History:  Procedure Laterality Date   APPENDECTOMY  1971   CARDIOVERSION N/A 06/23/2013   Procedure: CARDIOVERSION;  Surgeon: Lennette Bihari, MD;  Location: [facility] ENDOSCOPY;  Service: Cardiovascular;  Laterality: N/A;   CATARACT EXTRACTION W/ INTRAOCULAR LENS IMPLANT Bilateral    CESAREAN SECTION  1971   PLACEMENT OF BREAST IMPLANTS Bilateral 1985    Current Medications: No outpatient medications have been marked as taking for the 02/27/22 encounter (Appointment) with Meriam Sprague, MD.     Allergies:    Rosuvastatin calcium, Statins, Amoxicillin, Atorvastatin, Ezetimibe-simvastatin, and Penicillins   Social History   Socioeconomic History   Marital status: Married    Spouse name: Not on file   Number of children: Not on file   Years of education: Not on file   Highest education level: Not on file  Occupational History   Not on file  Tobacco Use   Smoking status: Never    Passive exposure: Yes   Smokeless tobacco: Never  Vaping Use   Vaping Use: Never used  Substance and Sexual Activity   Alcohol use: Yes    Comment: 08/09/2013 "nothing to drink in >4 yrs; never had a problem w/it"   Drug use: No   Sexual activity: Not Currently  Other Topics Concern   Not on file  Social History Narrative   Not on file   Social Determinants of Health   Financial Resource Strain: Not on file  Food Insecurity: Not on file  Transportation Needs: Not on file  Physical Activity: Not on file  Stress: Not on file  Social Connections: Not on file     Family History: The patient's ***family history includes Cancer - Lung in her father; Cancer - Other in her maternal grandmother; Cardiomyopathy in her mother; Diabetes in her maternal grandfather; Heart attack in her mother; Stroke in her paternal grandfather.  ROS:   Please see the history of present illness.    ***  All other systems reviewed  and are negative.  EKGs/Labs/Other Studies Reviewed:    The following studies were reviewed today: TTE 2016-09-25: Study Conclusions   - Left ventricle: The cavity size was normal. There was mild    concentric hypertrophy. Systolic function was normal. The    estimated ejection fraction was in the range of 55% to 60%. Wall    motion was normal; there were no regional wall motion    abnormalities. Features are consistent with a pseudonormal left    ventricular filling pattern, with concomitant abnormal relaxation    and increased filling pressure (grade 2 diastolic dysfunction).  - Aortic valve:  There was trivial regurgitation.  - Mitral valve: Mildly calcified annulus.  - Left atrium: The atrium was severely dilated.   EKG:  EKG is personally reviewed.   02/27/2022 EKG: Rate ***. Sinus Rhythm ***   Recent Labs: 01/25/2022: ALT 17; B Natriuretic Peptide 322.4 02/02/2022: BUN 16; Creatinine, Ser 0.70; Hemoglobin 11.9; Platelets 353; Potassium 4.1; Sodium 137  Recent Lipid Panel    Component Value Date/Time   CHOL 232 (H) 03/23/2013 1105   TRIG 486 (H) 03/23/2013 1105   HDL 42 03/23/2013 1105   LDLCALC NOT CALC 03/23/2013 1105     Risk Assessment/Calculations:   {Does this patient have ATRIAL FIBRILLATION?:[phone]}       Physical Exam:    VS:  There were no vitals taken for this visit.    Wt Readings from Last 3 Encounters:  02/02/22 212 lb (96.2 kg)  12/31/21 211 lb 10.3 oz (96 kg)  02/27/21 244 lb (110.7 kg)     GEN: *** Well nourished, well developed in no acute distress HEENT: Normal NECK: No JVD; No carotid bruits LYMPHATICS: No lymphadenopathy CARDIAC: ***RRR, no murmurs, rubs, gallops RESPIRATORY: Clear to auscultation without rales, wheezing or rhonchi. ABDOMEN: Soft, non-tender, non-distended MUSCULOSKELETAL: No edema; No deformity  SKIN: Warm and dry NEUROLOGIC:  Alert and oriented x 3 PSYCHIATRIC:  Normal affect   ASSESSMENT:    No diagnosis found. PLAN:    In order of problems listed above:  #LE edema: Last TTE in 2018 with LVEF 55-60%, G2DD.  -*** lasix  #Permanent Afib: Followed by Dr. Johney Frame. Rate controlled and minimally symptomatic. CHADs-vasc 5. Declined DOAC as she did not tolerate apixaban. Has been continued on warfarin. -Continue dilt 360mg  daily -Continue warfarin -Declined DOAC or watchman  #HTN: -Continue dilt 360mg  daily -Continue telmisartan 80mg  daily  #HLD: #Statin Intolerance: -Continue zetia 10mg  daily  #DMII: -Management per PCP  #OSA on CPAP      {Are you ordering a CV Procedure (e.g. stress test,  cath, DCCV, TEE, etc)?   Press F2        :   F/U in *** weeks/months/year  Medication Adjustments/Labs and Tests Ordered: Current medicines are reviewed at length with the patient today.  Concerns regarding medicines are outlined above.  No orders of the defined types were placed in this encounter.  No orders of the defined types were placed in this encounter.   There are no Patient Instructions on file for this visit.    I,Tinashe Williams,acting as a for , MD.,have documented all relevant documentation on the behalf of , MD,as directed by  [id number]}, MD while in the presence of Neurosurgeon, MD.   ***

## 2022-02-27 NOTE — Telephone Encounter (Signed)
Pt's husband, Orvilla Fus, called stating that his wife was having significant BLE pain from lymphedema and was concerned about a DVT.   Reviewed pt's chart, pt has not seen anyone in this office and has a new pt appt on 7/5 with a PA. Returned call to both phone #'s listed, no answer, left vm.

## 2022-02-27 NOTE — Telephone Encounter (Signed)
Pt called back and asked to call home #.  Returned pt's call, no answer, lf vm stating that our office could not give any advice since we haven't seen her. Instructed her to go to ED if she were having significant pain.

## 2022-03-04 NOTE — Progress Notes (Signed)
[facility]           OF [place]  History and Physical   Zalena Holtzinger is a 81 y.o. female who presents with BLE swelling.  She states that she has had swelling for about 6-8 months.  She states that her legs were probably swelling before that, but has gotten significantly worse over the past 6-8 months.  She has never had any blood clots and there is no family history for this.  She states that the right leg is more painful than the left but the u/s was done of the left leg today.  She states that she does not wear compression b/c they hurt her legs.  She states the swelling is some better the next morning after she has been in bed.  She states that her legs itch.  Her right leg has had significant pain in the thigh but this has improved.  She states that her legs have swollen in the past to where they have been weeping.   She denies any rest pain or non healing wounds on her feet.  She does not really endorse claudication symptoms. She states that it has been more difficult to mobilize as her legs feel very heavy.    Her renal function during her hospitalization was normal.   She has hx of cesarean section.  She was recently hospitalized with AMS and hallucinations.  This was thought to be due to polypharmacy with combination of ambien/narcotics/ativan.  Her head CT scan was negative for acute intracranial abnormalities.   This was resolved.    She did have BLE swelling with cellulitis, which was improved with lasix and abx.  Duplex was negative for DVT.    She has hx of afib and was found to be non compliant with coumadin after nosebleed.  She was discharged from hospital on Eliquis.    She also has hx of HTN, HLD, depression, DM, neuropathy and worsening BLE swelling.   She has follow up with cardiology later this week.    The pt is not on a statin for cholesterol management.  The pt is not on a daily aspirin.   Other AC:  Eliquis The pt is on BB, ARB,  diuretic, CCB for hypertension.   The pt is diabetic.   Tobacco hx:  never  She is here with her husband of 34 years.  They just celebrated their anniversary on 6/24.     Past Medical History:  Diagnosis Date   Anxiety    Chronic insomnia    Hyperlipidemia    Hypertension    Hypertriglyceridemia    Obesity    OSA on CPAP    Persistent atrial fibrillation (HCC) 11/07/14   Chads2vasc score of at least 4   Type II diabetes mellitus ([place])    Valvular sclerosis 04/30/11   ECHO-Mitral Valve- mild to moderate mitral regurgitation. Central jet. Aortic Valve appears to be mildly sclerotic. Trace aortic regurgitation.EF >55%    Past Surgical History:  Procedure Laterality Date   APPENDECTOMY  1971   CARDIOVERSION N/A 06/23/2013   Procedure: CARDIOVERSION;  Surgeon: Troy Sine, MD;  Location: [facility];  Service: Cardiovascular;  Laterality: N/A;   CATARACT EXTRACTION W/ INTRAOCULAR LENS IMPLANT Bilateral    CESAREAN SECTION  1971   PLACEMENT OF BREAST IMPLANTS Bilateral 1985    Social History   Socioeconomic History   Marital status: Married    Spouse name: Not  on file   Number of children: Not on file   Years of education: Not on file   Highest education level: Not on file  Occupational History   Not on file  Tobacco Use   Smoking status: Never    Passive exposure: Yes   Smokeless tobacco: Never  Vaping Use   Vaping Use: Never used  Substance and Sexual Activity   Alcohol use: Yes    Comment: 08/09/2013 "nothing to drink in >4 yrs; never had a problem w/it"   Drug use: No   Sexual activity: Not Currently  Other Topics Concern   Not on file  Social History Narrative   Not on file   Social Determinants of Health   Financial Resource Strain: Not on file  Food Insecurity: Not on file  Transportation Needs: Not on file  Physical Activity: Not on file  Stress: Not on file  Social Connections: Not on file  Intimate Partner Violence: Not on file     Family  History  Problem Relation Age of Onset   Heart attack Mother    Cardiomyopathy Mother    Cancer - Lung Father    Cancer - Other Maternal Grandmother    Stroke Paternal Grandfather    Diabetes Maternal Grandfather     Current Outpatient Medications  Medication Sig Dispense Refill   ACCU-CHEK COMPACT PLUS test strip CHECK BLOOD SUGAR TWICE DAILY OR AS DIRECTED  0   acetaminophen (TYLENOL) 500 MG tablet Take 1,000 mg by mouth every 6 (six) hours as needed for headache or mild pain (pain).     Ascorbic Acid (VITAMIN C) 1000 MG tablet Take 2,000 mg by mouth daily.     cholecalciferol (VITAMIN D3) 25 MCG (1000 UT) tablet Take 1,000 Units by mouth daily.     diltiazem (CARDIZEM CD) 360 MG 24 hr capsule TAKE 1 CAPSULE BY MOUTH EVERY MORNING. Please make overdue appt with Dr. Rayann Heman for June 2023 for future refills. Thank you 1st attempt 90 capsule 0   ezetimibe (ZETIA) 10 MG tablet Take 10 mg by mouth at bedtime.      folic acid (FOLVITE) 1 MG tablet Take 1 mg by mouth every morning.      furosemide (LASIX) 40 MG tablet Take 40 mg by mouth daily as needed for edema.     glipiZIDE (GLUCOTROL) 10 MG tablet Take 10 mg by mouth every morning.      LORazepam (ATIVAN) 0.5 MG tablet Take 0.5 mg by mouth daily as needed for anxiety (jitters).      magnesium oxide (MAG-OX) 400 MG tablet Take 1 tablet (400 mg total) by mouth daily. 30 tablet 5   Melatonin 3 MG TABS Take 3 mg by mouth at bedtime as needed (sleep).     metFORMIN (GLUCOPHAGE) 1000 MG tablet Take 1,000 mg by mouth 2 (two) times daily with a meal.     metoprolol succinate (TOPROL-XL) 50 MG 24 hr tablet TAKE 2 TABLETS IN THE MORNING. TAKE 1 TABLET AT NIGHT. PLEASE KEEP UPCOMING APPT IN APRIL 2022 BEFORE ANYMORE REFILLS. THANK YOU 270 tablet 3   MYRBETRIQ 50 MG TB24 tablet Take 50 mg by mouth daily.     oxyCODONE (ROXICODONE) 5 MG immediate release tablet Take 1 tablet (5 mg total) by mouth every 4 (four) hours as needed for severe pain. 10 tablet  0   potassium chloride (KLOR-CON) 10 MEQ tablet TAKE 1 TABLET BY MOUTH EVERY DAY 90 tablet 3   Semaglutide,0.25 or 0.5MG /DOS, ([place],  0.25 OR 0.5 MG/DOSE,) 2 MG/1.5ML SOPN Inject 0.5 mg into the skin once a week. Sunday/ Monday     sertraline (ZOLOFT) 100 MG tablet Take 100 mg by mouth every morning.     sodium chloride (OCEAN) 0.65 % nasal spray Place 1 spray into the nose as needed for congestion.     telmisartan (MICARDIS) 80 MG tablet Take 80 mg by mouth daily.     vitamin B-12 (CYANOCOBALAMIN) 1000 MCG tablet Take 1,000 mcg by mouth every morning.     VITAMIN E PO Take 1 capsule by mouth once a week.     warfarin (COUMADIN) 5 MG tablet TAKE 1-2 TABLETS DAILY OR AS PRESCRIBED BY COUMADIN CLINIC 30 tablet 1   zolpidem (AMBIEN) 5 MG tablet Take 5 mg by mouth at bedtime.     No current facility-administered medications for this visit.    Allergies  Allergen Reactions   Rosuvastatin Calcium Other (See Comments)    myalgias    Statins Other (See Comments)    myalgia   Amoxicillin Rash   Atorvastatin Other (See Comments)    Back pain    Ezetimibe-Simvastatin Other (See Comments)    Back pain    Penicillins Itching, Swelling and Rash    Did it involve swelling of the face/tongue/throat, SOB, or low BP? No Did it involve sudden or severe rash/hives, skin peeling, or any reaction on the inside of your mouth or nose? Yes Did you need to seek medical attention at a hospital or doctor's office? No When did it last happen?   young adult    If all above answers are "NO", may proceed with cephalosporin use.    REVIEW OF SYSTEMS:   [X]  denotes positive finding, [ ]  denotes negative finding Cardiac  Comments:  Chest pain or chest pressure:    Shortness of breath upon exertion:    Short of breath when lying flat:    Irregular heart rhythm:        Vascular    Pain in calf, thigh, or hip brought on by ambulation:    Pain in feet at night that wakes you up from your sleep:     Blood  clot in your veins:    Leg swelling:  x       Pulmonary    Oxygen at home:    Productive cough:     Wheezing:         Neurologic    Sudden weakness in arms or legs:     Sudden numbness in arms or legs:     Sudden onset of difficulty speaking or slurred speech:    Temporary loss of vision in one eye:     Problems with dizziness:         Gastrointestinal    Blood in stool:     Vomited blood:         Genitourinary    Burning when urinating:     Blood in urine:        Psychiatric    Major depression:         Hematologic    Bleeding problems:    Problems with blood clotting too easily:        Skin    Rashes or ulcers:        Constitutional    Fever or chills:      PHYSICAL EXAMINATION:  Today's Vitals   03/12/22 1431  BP: (!) 185/102  Pulse: 94  Resp: 20  Temp: 98.1 F (36.7 C)  TempSrc: Temporal  SpO2: 95%  Weight: 259 lb 1.6 oz (117.5 kg)  Height: 5\' 5"  (1.651 m)   Body mass index is 43.12 kg/m.   General:  WDWN in NAD; vital signs documented above Gait: Not observed HENT: WNL, normocephalic Pulmonary: normal non-labored breathing without wheezing Cardiac: irregular HR; without carotid bruits Abdomen: obese Skin: without rashes Vascular Exam/Pulses:  Right Left  Radial 2+ (normal) 2+ (normal)  DP Brisk doppler flow Doppler flow at AT  PT Brisk doppler flow Brisk doppler flow   Extremities: BLE swelling; +Stemmer sign bilaterally    Neurologic: A&O X 3;  moving all extremities equally Psychiatric:  The pt has Normal affect.   Non-Invasive Vascular Imaging:   Venous duplex on 03/12/2022: +------------------+---------+------+-----------+------------+-------------  ----+  LEFT              Reflux NoRefluxReflux TimeDiameter cmsComments                                  Yes                                        +------------------+---------+------+-----------+------------+-------------  CFV               no                                                   +------------------+---------+------+-----------+------------+-------------  FV mid            no                                                  +------------------+---------+------+-----------+------------+-------------  Popliteal         no                                                  +------------------+---------+------+-----------+------------+-------------  GSV at [facility]        no                            0.47                  +------------------+---------+------+-----------+------------+-------------  GSV prox thigh              yes    >500 ms      0.42                  +------------------+---------+------+-----------+------------+-------------  GSV mid thigh               yes    >500 ms      0.48    out of fascia at the mid thigh     +------------------+---------+------+-----------+------------+-------------  GSV dist thigh  out of fascia      +------------------+---------+------+-----------+------------+-------------  GSV at knee                                             out of fascia      +------------------+---------+------+-----------+------------+-------------  SSV Pop Fossa     no                            0.39                  +------------------+---------+------+-----------+------------+-------------  posterior         no                            0.41  accessory                 +------------------+---------+------+-----------+------------+-------------   Summary:  Left:  - No evidence of deep vein thrombosis from the common femoral through the popliteal veins.  - No evidence of superficial venous thrombosis.  - The deep venous system is competent.  - The great saphenous vein is not competent (proximal to mid only).  - The small saphenous vein is competent.    Glenys Snader is a 81 y.o. female who presents with: BLE swelling/lymphedema     -pt does not have palpable pedal pulses but does have + doppler flow bilaterally -pt does not have evidence of DVT.  Pt does have venous reflux in the GSV in the proximal and mid thigh but not at the [facility] or in the deep system.  She is not a candidate for laser ablation.  -discussed with pt about wearing compression stockings but she is not interested in compression.  I did discuss possibly using ace wraps for compression.   -discussed the importance of leg elevation and how to elevate properly - pt is advised to elevate their legs and a diagram is given to them to demonstrate for pt to lay flat on their back with knees elevated and slightly bent with their feet higher than their knees, which puts their feet higher than their heart for 15 minutes per day.  If pt cannot lay flat, advised to lay as flat as possible.  -pt is advised to continue as much walking as possible and avoid sitting or standing for long periods of time.  -discussed importance of weight loss and exercise and that water aerobics would also be beneficial.   -also encouraged her to keep her legs moisturized for dryness and itchiness  -handout with recommendations given  -pt and husband very upset initially that they were not seeing Dr. Karin Lieu today.  Given that her right leg has worse symptoms, will have her return and have RLE venous reflux study.  Will also get ABI at that time as her pedal pulses were not palpable but doppler flow was present.  Will get these as baseline if she ever needs unna boots in the future.  After duplex at next visit, she may be candidate for lymphedema pumps.  Pt and her husband are in agreement with this plan.    -pt was hypertensive today.  She tells me that her pressure is not normally that high.  Advised her to take and record her BP at home and take with her to her cardiology visit.  Doreatha Massed, [facility]   Clinic MD:  Randie Heinz

## 2022-03-05 ENCOUNTER — Inpatient Hospital Stay (HOSPITAL_COMMUNITY)
Admission: EM | Admit: 2022-03-05 | Discharge: 2022-03-07 | DRG: 092 | Disposition: A | Discharge status: Home Health | Payer: Medicare Other | Attending: Internal Medicine | Admitting: Internal Medicine

## 2022-03-05 DIAGNOSIS — E119 Type 2 diabetes mellitus without complications: Secondary | ICD-10-CM

## 2022-03-05 DIAGNOSIS — I4819 Other persistent atrial fibrillation: Secondary | ICD-10-CM | POA: Diagnosis present

## 2022-03-05 DIAGNOSIS — Z8249 Family history of ischemic heart disease and other diseases of the circulatory system: Secondary | ICD-10-CM

## 2022-03-05 DIAGNOSIS — G4733 Obstructive sleep apnea (adult) (pediatric): Secondary | ICD-10-CM | POA: Diagnosis present

## 2022-03-05 DIAGNOSIS — Z823 Family history of stroke: Secondary | ICD-10-CM

## 2022-03-05 DIAGNOSIS — E538 Deficiency of other specified B group vitamins: Secondary | ICD-10-CM | POA: Diagnosis present

## 2022-03-05 DIAGNOSIS — Z801 Family history of malignant neoplasm of trachea, bronchus and lung: Secondary | ICD-10-CM

## 2022-03-05 DIAGNOSIS — L03116 Cellulitis of left lower limb: Secondary | ICD-10-CM | POA: Diagnosis present

## 2022-03-05 DIAGNOSIS — F419 Anxiety disorder, unspecified: Secondary | ICD-10-CM | POA: Diagnosis present

## 2022-03-05 DIAGNOSIS — R4182 Altered mental status, unspecified: Secondary | ICD-10-CM | POA: Diagnosis present

## 2022-03-05 DIAGNOSIS — R600 Localized edema: Secondary | ICD-10-CM

## 2022-03-05 DIAGNOSIS — G9340 Encephalopathy, unspecified: Secondary | ICD-10-CM | POA: Diagnosis present

## 2022-03-05 DIAGNOSIS — Z91148 Patient's other noncompliance with medication regimen for other reason: Secondary | ICD-10-CM

## 2022-03-05 DIAGNOSIS — Z880 Allergy status to penicillin: Secondary | ICD-10-CM

## 2022-03-05 DIAGNOSIS — Z79899 Other long term (current) drug therapy: Secondary | ICD-10-CM

## 2022-03-05 DIAGNOSIS — F5104 Psychophysiologic insomnia: Secondary | ICD-10-CM | POA: Diagnosis present

## 2022-03-05 DIAGNOSIS — F32A Depression, unspecified: Secondary | ICD-10-CM | POA: Diagnosis present

## 2022-03-05 DIAGNOSIS — E876 Hypokalemia: Secondary | ICD-10-CM | POA: Diagnosis not present

## 2022-03-05 DIAGNOSIS — I878 Other specified disorders of veins: Secondary | ICD-10-CM | POA: Diagnosis present

## 2022-03-05 DIAGNOSIS — T40605A Adverse effect of unspecified narcotics, initial encounter: Secondary | ICD-10-CM | POA: Diagnosis present

## 2022-03-05 DIAGNOSIS — Z7901 Long term (current) use of anticoagulants: Secondary | ICD-10-CM

## 2022-03-05 DIAGNOSIS — E1142 Type 2 diabetes mellitus with diabetic polyneuropathy: Secondary | ICD-10-CM | POA: Diagnosis present

## 2022-03-05 DIAGNOSIS — E1165 Type 2 diabetes mellitus with hyperglycemia: Secondary | ICD-10-CM

## 2022-03-05 DIAGNOSIS — Z6837 Body mass index (BMI) 37.0-37.9, adult: Secondary | ICD-10-CM | POA: Diagnosis not present

## 2022-03-05 DIAGNOSIS — G928 Other toxic encephalopathy: Principal | ICD-10-CM | POA: Diagnosis present

## 2022-03-05 DIAGNOSIS — T424X5A Adverse effect of benzodiazepines, initial encounter: Secondary | ICD-10-CM | POA: Diagnosis present

## 2022-03-05 DIAGNOSIS — Z888 Allergy status to other drugs, medicaments and biological substances status: Secondary | ICD-10-CM

## 2022-03-05 DIAGNOSIS — Z7984 Long term (current) use of oral hypoglycemic drugs: Secondary | ICD-10-CM

## 2022-03-05 DIAGNOSIS — E782 Mixed hyperlipidemia: Secondary | ICD-10-CM | POA: Diagnosis present

## 2022-03-05 DIAGNOSIS — Z809 Family history of malignant neoplasm, unspecified: Secondary | ICD-10-CM

## 2022-03-05 DIAGNOSIS — I10 Essential (primary) hypertension: Secondary | ICD-10-CM | POA: Diagnosis present

## 2022-03-05 DIAGNOSIS — E781 Pure hyperglyceridemia: Secondary | ICD-10-CM | POA: Diagnosis present

## 2022-03-05 DIAGNOSIS — I340 Nonrheumatic mitral (valve) insufficiency: Secondary | ICD-10-CM | POA: Diagnosis present

## 2022-03-05 DIAGNOSIS — B372 Candidiasis of skin and nail: Secondary | ICD-10-CM

## 2022-03-05 DIAGNOSIS — Z833 Family history of diabetes mellitus: Secondary | ICD-10-CM

## 2022-03-05 DIAGNOSIS — L03115 Cellulitis of right lower limb: Secondary | ICD-10-CM | POA: Diagnosis present

## 2022-03-05 LAB — CBC WITH DIFFERENTIAL/PLATELET
Abs Immature Granulocytes: 0.04 10*3/uL (ref 0.00–0.07)
Basophils Absolute: 0.1 10*3/uL (ref 0.0–0.1)
Basophils Relative: 1 %
Eosinophils Absolute: 0 10*3/uL (ref 0.0–0.5)
Eosinophils Relative: 0 %
HCT: 34 % — ABNORMAL LOW (ref 36.0–46.0)
Hemoglobin: 10.4 g/dL — ABNORMAL LOW (ref 12.0–15.0)
Immature Granulocytes: 0 %
Lymphocytes Relative: 16 %
Lymphs Abs: 1.5 10*3/uL (ref 0.7–4.0)
MCH: 24.1 pg — ABNORMAL LOW (ref 26.0–34.0)
MCHC: 30.6 g/dL (ref 30.0–36.0)
MCV: 78.7 fL — ABNORMAL LOW (ref 80.0–100.0)
Monocytes Absolute: 1 10*3/uL (ref 0.1–1.0)
Monocytes Relative: 11 %
Neutro Abs: 6.8 10*3/uL (ref 1.7–7.7)
Neutrophils Relative %: 72 %
Platelets: 342 10*3/uL (ref 150–400)
RBC: 4.32 MIL/uL (ref 3.87–5.11)
RDW: 15.9 % — ABNORMAL HIGH (ref 11.5–15.5)
WBC: 9.5 10*3/uL (ref 4.0–10.5)
nRBC: 0 % (ref 0.0–0.2)

## 2022-03-05 LAB — COMPREHENSIVE METABOLIC PANEL
ALT: 18 U/L (ref 0–44)
AST: 26 U/L (ref 15–41)
Albumin: 3.1 g/dL — ABNORMAL LOW (ref 3.5–5.0)
Alkaline Phosphatase: 85 U/L (ref 38–126)
Anion gap: 10 (ref 5–15)
BUN: 16 mg/dL (ref 8–23)
CO2: 23 mmol/L (ref 22–32)
Calcium: 9.2 mg/dL (ref 8.9–10.3)
Chloride: 104 mmol/L (ref 98–111)
Creatinine, Ser: 0.77 mg/dL (ref 0.44–1.00)
GFR, Estimated: >60 mL/min (ref >60)
Glucose, Bld: 183 mg/dL — ABNORMAL HIGH (ref 70–99)
Potassium: 3.9 mmol/L (ref 3.5–5.1)
Sodium: 137 mmol/L (ref 135–145)
Total Bilirubin: 1.2 mg/dL (ref 0.3–1.2)
Total Protein: 7 g/dL (ref 6.5–8.1)

## 2022-03-05 LAB — TROPONIN I (HIGH SENSITIVITY): Troponin I (High Sensitivity): 11 ng/L (ref <18)

## 2022-03-05 LAB — BRAIN NATRIURETIC PEPTIDE: B Natriuretic Peptide: 253.4 pg/mL — ABNORMAL HIGH (ref 0.0–100.0)

## 2022-03-05 LAB — PROTIME-INR
INR: 1.2 (ref 0.8–1.2)
Prothrombin Time: 15.5 seconds — ABNORMAL HIGH (ref 11.4–15.2)

## 2022-03-05 MED ORDER — SODIUM CHLORIDE 0.9 % IV SOLN
1.0000 g | Freq: Once | INTRAVENOUS | Status: AC
  Administered 2022-03-05: 1 g via INTRAVENOUS
  Filled 2022-03-05: qty 10

## 2022-03-05 MED ORDER — FUROSEMIDE 10 MG/ML IJ SOLN
40.0000 mg | Freq: Once | INTRAMUSCULAR | Status: AC
  Administered 2022-03-05: 40 mg via INTRAVENOUS
  Filled 2022-03-05: qty 4

## 2022-03-05 MED ORDER — MORPHINE SULFATE (PF) 4 MG/ML IV SOLN
4.0000 mg | Freq: Once | INTRAVENOUS | Status: AC
  Administered 2022-03-05: 4 mg via INTRAVENOUS
  Filled 2022-03-05: qty 1

## 2022-03-05 NOTE — ED Notes (Addendum)
Called daughter of patient Amy Wright), with patient consent. Daughter states mother has been non-compliant on medications, specifically lasix and diabetes medications for at least a year. Daughter also states mother has avoided all of her appointments with her PPCs. Daughter states patient is hallucinating family members, animals, in a castle, fingers crawling over furniture etc. Hallucinations have progressively worsen over the past 3 weeks. Daughter is also concerned of family history of stroke and AMI especially with her immobility.  Daughter would like to receive updates and can be reached anytime at [phone].

## 2022-03-05 NOTE — ED Notes (Signed)
Daughter calls to give report to advocate for patient:  [phone] Amy Wright  Pt has been hallucinating for several weeks on and off. Seeing cats in house and people looking in windows. Legs have been swollen for 1 year. Has been avoiding vascular appointments. Daughter is unsure is dad is taking adequate care of mother- handles her medication ect.

## 2022-03-05 NOTE — H&P (Incomplete)
History and Physical    Patient: Amy Wright [id number] DOB: 1940/10/23 DOA: 03/05/2022 DOS: the patient was seen and examined on 03/06/2022 PCP: Robyne Peers, MD  Patient coming from: Home  Chief Complaint:  Chief Complaint  Patient presents with   Leg Swelling   HPI: Amy Wright is a 81 y.o. female with medical history significant of persistent atrial fibrillation on Coumadin, chronic lymphedema, hypertension, hyperlipidemia and depression who presents with altered mental status.  Pt is a limited historian due to [place]. She is alert and oriented to self, time but not place. Having tangential thought. Reports she has been having on and off increasing lower extremity edema for the past 5 months. Legs have been painful. Denies any chest pain or shortness of breath. Reportedly per daughter, whom I am unable to reach by phone, patient has been noncompliant with her medication for at least a year and has been missing appointments with her PCP.  Patient has been having hallucinations of family members and animals over the past several weeks.  She has some notable for significant bruising to her left inner thigh and is unclear whether she has been having falls.  She reports compliant with Coumadin but her INR subtherapeutic today.  In the ED, she was afebrile mildly tachycardic with elevated BP up to 190s on room air.  CBC with no leukocytosis, hemoglobin of 10.4 down from 13.1 last month. BNP of 253, troponin of 11.   She was given IV Rocephin in the ED for possible cellulitis. Hospitalist then called for admission.    Review of Systems: As mentioned in the history of present illness. All other systems reviewed and are negative. Past Medical History:  Diagnosis Date   Anxiety    Chronic insomnia    Hyperlipidemia    Hypertension    Hypertriglyceridemia    Obesity    OSA on CPAP    Persistent atrial fibrillation (HCC) 11/07/14   Chads2vasc score of at least 4   Type II  diabetes mellitus ([place])    Valvular sclerosis 04/30/11   ECHO-Mitral Valve- mild to moderate mitral regurgitation. Central jet. Aortic Valve appears to be mildly sclerotic. Trace aortic regurgitation.EF >55%   Past Surgical History:  Procedure Laterality Date   APPENDECTOMY  1971   CARDIOVERSION N/A 06/23/2013   Procedure: CARDIOVERSION;  Surgeon: Troy Sine, MD;  Location: [place];  Service: Cardiovascular;  Laterality: N/A;   CATARACT EXTRACTION W/ INTRAOCULAR LENS IMPLANT Bilateral    CESAREAN SECTION  1971   PLACEMENT OF BREAST IMPLANTS Bilateral 1985   Social History:  reports that she has never smoked. She has been exposed to tobacco smoke. She has never used smokeless tobacco. She reports current alcohol use. She reports that she does not use drugs.  Allergies  Allergen Reactions   Rosuvastatin Calcium Other (See Comments)    myalgias    Statins Other (See Comments)    myalgia   Amoxicillin Rash   Atorvastatin Other (See Comments)    Back pain    Ezetimibe-Simvastatin Other (See Comments)    Back pain    Penicillins Itching, Swelling and Rash    Did it involve swelling of the face/tongue/throat, SOB, or low BP? No Did it involve sudden or severe rash/hives, skin peeling, or any reaction on the inside of your mouth or nose? Yes Did you need to seek medical attention at a hospital or doctor's office? No When did it last happen?   young adult    If  all above answers are "NO", may proceed with cephalosporin use.    Family History  Problem Relation Age of Onset   Heart attack Mother    Cardiomyopathy Mother    Cancer - Lung Father    Cancer - Other Maternal Grandmother    Stroke Paternal Grandfather    Diabetes Maternal Grandfather     Prior to Admission medications   Medication Sig Start Date End Date Taking? Authorizing Provider  ACCU-CHEK COMPACT PLUS test strip CHECK BLOOD SUGAR TWICE DAILY OR AS DIRECTED 02/27/16   [provider]   acetaminophen (TYLENOL) 500 MG tablet Take 1,000 mg by mouth every 6 (six) hours as needed for headache or mild pain (pain).    [provider]  Ascorbic Acid (VITAMIN C) 1000 MG tablet Take 2,000 mg by mouth daily.    [provider]  cholecalciferol (VITAMIN D3) 25 MCG (1000 UT) tablet Take 1,000 Units by mouth daily.    [provider]  diltiazem (CARDIZEM CD) 360 MG 24 hr capsule TAKE 1 CAPSULE BY MOUTH EVERY MORNING. Please make overdue appt with Dr. Rayann Heman for June 2023 for future refills. Thank you 1st attempt 11/27/21   Thompson Grayer, MD  ezetimibe (ZETIA) 10 MG tablet Take 10 mg by mouth at bedtime.     [provider]  folic acid (FOLVITE) 1 MG tablet Take 1 mg by mouth every morning.     [provider]  furosemide (LASIX) 40 MG tablet Take 40 mg by mouth daily as needed for edema.    [provider]  glipiZIDE (GLUCOTROL) 10 MG tablet Take 10 mg by mouth every morning.  03/24/17   [provider]  LORazepam (ATIVAN) 0.5 MG tablet Take 0.5 mg by mouth daily as needed for anxiety (jitters).     [provider]  magnesium oxide (MAG-OX) 400 MG tablet Take 1 tablet (400 mg total) by mouth daily. 02/14/16   Chanetta Marshall K, NP  Melatonin 3 MG TABS Take 3 mg by mouth at bedtime as needed (sleep).    [provider]  metFORMIN (GLUCOPHAGE) 1000 MG tablet Take 1,000 mg by mouth 2 (two) times daily with a meal.    [provider]  metoprolol succinate (TOPROL-XL) 50 MG 24 hr tablet TAKE 2 TABLETS IN THE MORNING. TAKE 1 TABLET AT NIGHT. PLEASE KEEP UPCOMING APPT IN APRIL 2022 BEFORE ANYMORE REFILLS. THANK YOU 01/14/21   Allred, Jeneen Rinks, MD  MYRBETRIQ 50 MG TB24 tablet Take 50 mg by mouth daily. 01/21/21   [provider]  oxyCODONE (ROXICODONE) 5 MG immediate release tablet Take 1 tablet (5 mg total) by mouth every 4 (four) hours as needed for severe pain. 01/25/22   Sherwood Gambler, MD  potassium chloride  (KLOR-CON) 10 MEQ tablet TAKE 1 TABLET BY MOUTH EVERY DAY 01/14/21   Allred, Jeneen Rinks, MD  Semaglutide,0.25 or 0.5MG /DOS, (OZEMPIC, 0.25 OR 0.5 MG/DOSE,) 2 MG/1.5ML SOPN Inject 0.5 mg into the skin once a week. Sunday/ Monday    [provider]  sertraline (ZOLOFT) 100 MG tablet Take 100 mg by mouth every morning. 02/04/17   [provider]  sodium chloride (OCEAN) 0.65 % nasal spray Place 1 spray into the nose as needed for congestion.    [provider]  telmisartan (MICARDIS) 80 MG tablet Take 80 mg by mouth daily.    [provider]  vitamin B-12 (CYANOCOBALAMIN) 1000 MCG tablet Take 1,000 mcg by mouth every morning.    [provider]  VITAMIN E PO Take 1 capsule by mouth once a week.    [provider]  warfarin (COUMADIN) 5 MG tablet TAKE 1-2 TABLETS DAILY OR AS PRESCRIBED BY COUMADIN CLINIC 12/09/21   Sheilah Pigeon, PA-C  zolpidem (AMBIEN) 5 MG tablet Take 5 mg by mouth at bedtime.    [provider]    Physical Exam: Vitals:   03/06/22 0000 03/06/22 0027 03/06/22 0029 03/06/22 0128  BP: (!) 159/88   (!) 164/114  Pulse: (!) 116 (!) 113 (!) 107 (!) 117  Resp: 18 (!) 22 (!) 24 20  Temp:    97.8 F (36.6 C)  TempSrc:    Oral  SpO2: 97% 97% 98% 98%  Weight:      Height:       Constitutional: NAD, calm, comfortable, elderly female laying flat in bed Eyes: PERRL, lids and conjunctivae normal ENMT: Mucous membranes are moist. Neck: normal, supple Respiratory: clear to auscultation bilaterally, no wheezing, no crackles. Normal respiratory effort.  Cardiovascular: Regular rate and rhythm, no murmurs / rubs / gallops.  Patient with +4 pitting edema of bilateral lower extremity up to knee with surrounding erythema.  No weeping of fluids. abdomen: no tenderness, no masses palpated Bowel sounds positive.  Back: No decubitus ulcers noted Musculoskeletal: no clubbing / cyanosis. No joint deformity upper and lower extremities. Good  ROM, no contractures. Normal muscle tone.  Skin: Chronic venous is changes of bilateral extremity Neurologic: CN 2-12 grossly intact.  4-5 strength of lower extremity.  Alert and oriented to self, time and current president but thinks she is in [place].   Psychiatric: With tangential thoughts although redirectable.  Frequently talks about her husband and machines going off in the room.  data Reviewed:  See HPI  Assessment and Plan: * AMS (altered mental status) - Unclear baseline but currently she is alert and oriented to self and time but not place.  Also having tangential thoughts although redirectable.  Per family, she has been having hallucinations for the past 3 weeks -Unclear if she has been taking her medications including Coumadin.  Her INR is subtherapeutic today but bruising noted on exam and her history of increased falls is concerning. Will obtain stat CT head. Also obtain left hip/pelvis X-ray for bruising seen on left inner thigh.   Candidiasis of skin -on abdominal pannus -nystatin ointment BID  BMI 37.0-37.9, adult BMI of 37  Lower extremity edema -secondary to medication noncompliance.  -She was initially given IV Rocephin for possible cellulitis however suspect bilateral erythema due to chronic venous stasis changes.  Will discontinue IV antibiotics -Continue daily IV Lasix -will need to follow with lymphedema clinic output  Essential hypertension -elevated likely due to medication non-compliance -currently on IV Lasix.  -resume home medication pending med rec  DM2 (diabetes mellitus, type 2) (HCC) -check HbA1C -Start on moderate SSI  Persistent atrial fibrillation (HCC) Mild tachycardic but rate controlled.  -INR is subtherpeutic. Hold Coumadin for now pending results of CT head.      Advance Care Planning:   Code Status: Full Code   Consults: none  Family Communication: Attempted to contact daughter but sent to voicemail  Severity of  Illness: The appropriate patient status for this patient is OBSERVATION. Observation status is judged to be reasonable and necessary in order to provide the required intensity of service to ensure the patient's safety. The patient's presenting symptoms, physical exam findings, and initial radiographic and laboratory data in the context of their medical  condition is felt to place them at decreased risk for further clinical deterioration. Furthermore, it is anticipated that the patient will be medically stable for discharge from the hospital within 2 midnights of admission.   Author: Anselm Jungling, DO 03/06/2022 2:05 AM  For on call review [email or web address].

## 2022-03-05 NOTE — ED Provider Notes (Signed)
[facility] EMERGENCY DEPARTMENT Provider Note  CSN: [id number] Arrival date & time: 03/05/22 2104  Chief Complaint(s) Leg Swelling  HPI Amy Wright is a 81 y.o. female with PMH HTN, HLD, persistent A-fib, T2DM who presents emergency department for evaluation of bilateral lower extremity edema.  Patient has a history of chronic lymphedema and has been canceling her vascular surgery appointments for no known reason.  Her family is concerned that she is hallucinating and seeing cats in the room when they are not there, people in the room and they are not there.  She has been taking an unknown amount of Ambien nightly and has decided to stop taking her Lasix because she states it "wears me out".  She arrives with severe bilateral lower extremity edema and leg pain.  She arrives with a new lower extremity rash that extends from the middle of the foot to the mid calf.  Denies chest pain, shortness of breath, headache, fever or other systemic symptoms.   Past Medical History Past Medical History:  Diagnosis Date   Anxiety    Chronic insomnia    Hyperlipidemia    Hypertension    Hypertriglyceridemia    Obesity    OSA on CPAP    Persistent atrial fibrillation (HCC) 11/07/14   Chads2vasc score of at least 4   Type II diabetes mellitus (HCC)    Valvular sclerosis 04/30/11   ECHO-Mitral Valve- mild to moderate mitral regurgitation. Central jet. Aortic Valve appears to be mildly sclerotic. 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 Authorizing Provider  ACCU-CHEK COMPACT PLUS test strip CHECK BLOOD SUGAR TWICE DAILY OR AS DIRECTED 02/27/16   [provider]  acetaminophen (TYLENOL) 500 MG tablet Take 1,000 mg by mouth every 6 (six) hours as needed for headache or mild pain (pain).    [provider]  Ascorbic Acid (VITAMIN C) 1000 MG tablet Take 2,000 mg by mouth daily.    [provider]  cholecalciferol (VITAMIN D3) 25 MCG (1000 UT) tablet Take 1,000 Units by mouth daily.    [provider]  diltiazem (CARDIZEM CD) 360 MG 24 hr capsule TAKE 1 CAPSULE BY MOUTH EVERY MORNING. Please make overdue appt with Dr. Johney Frame for June 2023 for future refills. Thank you 1st attempt 11/27/21   Hillis Range, MD  ezetimibe (ZETIA) 10 MG tablet Take 10 mg by mouth at bedtime.     [provider]  folic acid (FOLVITE)  1 MG tablet Take 1 mg by mouth every morning.     [provider]  furosemide (LASIX) 40 MG tablet Take 40 mg by mouth daily as needed for edema.    [provider]  glipiZIDE (GLUCOTROL) 10 MG tablet Take 10 mg by mouth every morning.  03/24/17   [provider]  LORazepam (ATIVAN) 0.5 MG tablet Take 0.5 mg by mouth daily as needed for anxiety (jitters).     [provider]  magnesium oxide (MAG-OX) 400 MG tablet Take 1 tablet (400  mg total) by mouth daily. 02/14/16   Gypsy BalsamSeiler, Amber K, NP  Melatonin 3 MG TABS Take 3 mg by mouth at bedtime as needed (sleep).    [provider]  metFORMIN (GLUCOPHAGE) 1000 MG tablet Take 1,000 mg by mouth 2 (two) times daily with a meal.    [provider]  metoprolol succinate (TOPROL-XL) 50 MG 24 hr tablet TAKE 2 TABLETS IN THE MORNING. TAKE 1 TABLET AT NIGHT. PLEASE KEEP UPCOMING APPT IN APRIL 2022 BEFORE ANYMORE REFILLS. THANK YOU 01/14/21   Allred, Fayrene FearingJames, MD  MYRBETRIQ 50 MG TB24 tablet Take 50 mg by mouth daily. 01/21/21   [provider]  oxyCODONE (ROXICODONE) 5 MG immediate release tablet Take 1 tablet (5 mg total) by mouth every 4 (four) hours as needed for severe pain. 01/25/22   Pricilla LovelessGoldston, Scott, MD  potassium chloride (KLOR-CON) 10 MEQ tablet TAKE 1 TABLET BY MOUTH EVERY DAY 01/14/21   Allred, Fayrene FearingJames, MD  Semaglutide,0.25 or 0.5MG /DOS, (OZEMPIC, 0.25 OR 0.5 MG/DOSE,) 2 MG/1.5ML SOPN Inject 0.5 mg into the skin once a week. Sunday/ Monday    [provider]  sertraline (ZOLOFT) 100 MG tablet Take 100 mg by mouth every morning. 02/04/17   [provider]  sodium chloride (OCEAN) 0.65 % nasal spray Place 1 spray into the nose as needed for congestion.    [provider]  telmisartan (MICARDIS) 80 MG tablet Take 80 mg by mouth daily.    [provider]  vitamin B-12 (CYANOCOBALAMIN) 1000 MCG tablet Take 1,000 mcg by mouth every morning.    [provider]  VITAMIN E PO Take 1 capsule by mouth once a week.    [provider]  warfarin (COUMADIN) 5 MG tablet TAKE 1-2 TABLETS DAILY OR AS PRESCRIBED BY COUMADIN CLINIC 12/09/21   Sheilah PigeonUrsuy, Renee Lynn, PA-C  zolpidem (AMBIEN) 5 MG tablet Take 5 mg by mouth at bedtime.    [provider]                                                                                                                                    Past Surgical History Past Surgical History:  Procedure  Laterality Date   APPENDECTOMY  1971   CARDIOVERSION N/A 06/23/2013   Procedure: CARDIOVERSION;  Surgeon: Lennette Biharihomas A Kelly, MD;  [facility];  Service: Cardiovascular;  Laterality:  N/A;   CATARACT EXTRACTION W/ INTRAOCULAR LENS IMPLANT Bilateral    CESAREAN SECTION  1971   PLACEMENT OF BREAST IMPLANTS Bilateral 1985   Family History Family History  Problem Relation Age of Onset   Heart attack Mother    Cardiomyopathy Mother    Cancer - Lung Father    Cancer - Other Maternal Grandmother    Stroke Paternal Grandfather    Diabetes Maternal Grandfather     Social History Social History   Tobacco Use   Smoking status: Never    Passive exposure: Yes   Smokeless tobacco: Never  Vaping Use   Vaping Use: Never used  Substance Use Topics   Alcohol use: Yes    Comment: 08/09/2013 "nothing to drink in >4 yrs; never had a problem w/it"   Drug use: No   Allergies Rosuvastatin calcium, Statins, Amoxicillin, Atorvastatin, Ezetimibe-simvastatin, and Penicillins  Review of Systems Review of Systems  Cardiovascular:  Positive for leg swelling.  Skin:  Positive for rash.  Psychiatric/Behavioral:  Positive for hallucinations.     Physical Exam Vital Signs  I have reviewed the triage vital signs BP (!) 157/62 (BP Location: Left Arm)   Pulse (!) 103   Temp 97.6 F (36.4 C) (Oral)   Resp 16   Ht 5\' 5"  (1.651 m)   Wt 102.1 kg   SpO2 100%   BMI 37.44 kg/m   Physical Exam Vitals and nursing note reviewed.  Constitutional:      General: She is not in acute distress.    Appearance: She is well-developed. She is ill-appearing.  HENT:     Head: Normocephalic and atraumatic.  Eyes:     Conjunctiva/sclera: Conjunctivae normal.  Cardiovascular:     Rate and Rhythm: Normal rate and regular rhythm.     Heart sounds: No murmur heard. Pulmonary:     Effort: Pulmonary effort is normal. No respiratory distress.     Breath sounds: Normal breath sounds.  Abdominal:      Palpations: Abdomen is soft.     Tenderness: There is no abdominal tenderness.  Musculoskeletal:        General: Tenderness present. No swelling.     Cervical back: Neck supple.     Right lower leg: Edema present.     Left lower leg: Edema present.  Skin:    General: Skin is warm and dry.     Capillary Refill: Capillary refill takes less than 2 seconds.     Findings: Erythema and rash present.  Neurological:     Mental Status: She is alert.  Psychiatric:        Mood and Affect: Mood normal.     ED Results and Treatments Labs (all labs ordered are listed, but only abnormal results are displayed) Labs Reviewed  COMPREHENSIVE METABOLIC PANEL  CBC WITH DIFFERENTIAL/PLATELET  BRAIN NATRIURETIC PEPTIDE  PROTIME-INR  TROPONIN I (HIGH SENSITIVITY)  Radiology No results found.  Pertinent labs & imaging results that were available during my care of the patient were reviewed by me and considered in my medical decision making (see MDM for details).  Medications Ordered in ED Medications - No data to display                                                                                                                                   Procedures Procedures  (including critical care time)  Medical Decision Making / ED Course   This patient presents to the ED for concern of lower extremity edema, this involves an extensive number of treatment options, and is a complaint that carries with it a high risk of complications and morbidity.  The differential diagnosis includes lymphedema, CHF exacerbation, medication noncompliance, polypharmacy  MDM: Patient seen emergency room for evaluation of bilateral lower extremity edema.  Physical exam reveals bilateral lower extremity pitting edema with erythema extending into the mid calf bilaterally that is tender to palpation.   Laboratory evaluation with a hemoglobin of 10.4, BNP elevated to 253.4 but is otherwise unremarkable.  Patient started on ceftriaxone for lower extremity erythema that appears to be cellulitis but this may also represent chronic venous stasis which may require topical steroids while inpatient.  Lasix begun and patient admitted for diuresis.   Additional history obtained: -Additional history obtained from multiple family members -External records from outside source obtained and reviewed including: Chart review including previous notes, labs, imaging, consultation notes   Lab Tests: -I ordered, reviewed, and interpreted labs.   The pertinent results include:   Labs Reviewed  COMPREHENSIVE METABOLIC PANEL  CBC WITH DIFFERENTIAL/PLATELET  BRAIN NATRIURETIC PEPTIDE  PROTIME-INR  TROPONIN I (HIGH SENSITIVITY)      EKG   EKG Interpretation  Date/Time:  Wednesday March 05 2022 22:11:17 EDT Ventricular Rate:  111 PR Interval:    QRS Duration: 86 QT Interval:  339 QTC Calculation: 461 R Axis:   63 Text Interpretation: Atrial fibrillation Borderline repolarization abnormality Confirmed by Thamara Leger ([id number]) on 03/05/2022 11:45:13 PM          Medicines ordered and prescription drug management: No orders of the defined types were placed in this encounter.   -I have reviewed the patients home medicines and have made adjustments as needed  Critical interventions none   Cardiac Monitoring: The patient was maintained on a cardiac monitor.  I personally viewed and interpreted the cardiac monitored which showed an underlying rhythm of: Atrial fibrillation  Social Determinants of Health:  Factors impacting patients care include: none   Reevaluation: After the interventions noted above, I reevaluated the patient and found that they have :stayed the same  Co morbidities that complicate the patient evaluation  Past Medical History:  Diagnosis Date   Anxiety    Chronic  insomnia    Hyperlipidemia    Hypertension    Hypertriglyceridemia    Obesity  OSA on CPAP    Persistent atrial fibrillation (HCC) 11/07/14   Chads2vasc score of at least 4   Type II diabetes mellitus (HCC)    Valvular sclerosis 04/30/11   ECHO-Mitral Valve- mild to moderate mitral regurgitation. Central jet. Aortic Valve appears to be mildly sclerotic. Trace aortic regurgitation.EF >55%      Dispostion: I considered admission for this patient, and given need for diuresis and hallucinations, patient will require admission     Final Clinical Impression(s) / ED Diagnoses Final diagnoses:  None     @PCDICTATION @    , MD 03/05/22 2346

## 2022-03-05 NOTE — ED Triage Notes (Signed)
BIB [facility] from home. Pt reports leg swelling for 5 months- became too painful tonight so called EMS. Also reports red rash and pain under abdominal fold. Uses walker at home. Aox4.

## 2022-03-05 NOTE — ED Notes (Signed)
Patient gives verbal consent to share medical information with her daughter [name].

## 2022-03-06 ENCOUNTER — Observation Stay (HOSPITAL_COMMUNITY): Payer: Medicare Other

## 2022-03-06 ENCOUNTER — Encounter (HOSPITAL_COMMUNITY): Payer: Medicare Other

## 2022-03-06 ENCOUNTER — Other Ambulatory Visit (HOSPITAL_COMMUNITY): Payer: Self-pay

## 2022-03-06 DIAGNOSIS — Z6837 Body mass index (BMI) 37.0-37.9, adult: Secondary | ICD-10-CM | POA: Diagnosis not present

## 2022-03-06 DIAGNOSIS — T40605A Adverse effect of unspecified narcotics, initial encounter: Secondary | ICD-10-CM | POA: Diagnosis present

## 2022-03-06 DIAGNOSIS — I4819 Other persistent atrial fibrillation: Secondary | ICD-10-CM | POA: Diagnosis present

## 2022-03-06 DIAGNOSIS — E781 Pure hyperglyceridemia: Secondary | ICD-10-CM | POA: Diagnosis present

## 2022-03-06 DIAGNOSIS — Z823 Family history of stroke: Secondary | ICD-10-CM | POA: Diagnosis not present

## 2022-03-06 DIAGNOSIS — E876 Hypokalemia: Secondary | ICD-10-CM | POA: Diagnosis not present

## 2022-03-06 DIAGNOSIS — I10 Essential (primary) hypertension: Secondary | ICD-10-CM | POA: Diagnosis present

## 2022-03-06 DIAGNOSIS — L03115 Cellulitis of right lower limb: Secondary | ICD-10-CM | POA: Diagnosis present

## 2022-03-06 DIAGNOSIS — R443 Hallucinations, unspecified: Secondary | ICD-10-CM | POA: Diagnosis not present

## 2022-03-06 DIAGNOSIS — T424X5A Adverse effect of benzodiazepines, initial encounter: Secondary | ICD-10-CM | POA: Diagnosis present

## 2022-03-06 DIAGNOSIS — R4182 Altered mental status, unspecified: Secondary | ICD-10-CM | POA: Diagnosis present

## 2022-03-06 DIAGNOSIS — E119 Type 2 diabetes mellitus without complications: Secondary | ICD-10-CM | POA: Diagnosis not present

## 2022-03-06 DIAGNOSIS — E782 Mixed hyperlipidemia: Secondary | ICD-10-CM | POA: Diagnosis present

## 2022-03-06 DIAGNOSIS — G9340 Encephalopathy, unspecified: Secondary | ICD-10-CM | POA: Diagnosis present

## 2022-03-06 DIAGNOSIS — Z8249 Family history of ischemic heart disease and other diseases of the circulatory system: Secondary | ICD-10-CM | POA: Diagnosis not present

## 2022-03-06 DIAGNOSIS — B372 Candidiasis of skin and nail: Secondary | ICD-10-CM | POA: Diagnosis present

## 2022-03-06 DIAGNOSIS — R600 Localized edema: Secondary | ICD-10-CM

## 2022-03-06 DIAGNOSIS — E1142 Type 2 diabetes mellitus with diabetic polyneuropathy: Secondary | ICD-10-CM | POA: Diagnosis present

## 2022-03-06 DIAGNOSIS — Z7984 Long term (current) use of oral hypoglycemic drugs: Secondary | ICD-10-CM | POA: Diagnosis not present

## 2022-03-06 DIAGNOSIS — Z91148 Patient's other noncompliance with medication regimen for other reason: Secondary | ICD-10-CM | POA: Diagnosis not present

## 2022-03-06 DIAGNOSIS — G928 Other toxic encephalopathy: Secondary | ICD-10-CM | POA: Diagnosis present

## 2022-03-06 DIAGNOSIS — F5104 Psychophysiologic insomnia: Secondary | ICD-10-CM | POA: Diagnosis present

## 2022-03-06 DIAGNOSIS — L03116 Cellulitis of left lower limb: Secondary | ICD-10-CM | POA: Diagnosis present

## 2022-03-06 DIAGNOSIS — F32A Depression, unspecified: Secondary | ICD-10-CM | POA: Diagnosis present

## 2022-03-06 DIAGNOSIS — M7989 Other specified soft tissue disorders: Secondary | ICD-10-CM | POA: Diagnosis not present

## 2022-03-06 DIAGNOSIS — E538 Deficiency of other specified B group vitamins: Secondary | ICD-10-CM | POA: Diagnosis present

## 2022-03-06 DIAGNOSIS — I340 Nonrheumatic mitral (valve) insufficiency: Secondary | ICD-10-CM | POA: Diagnosis present

## 2022-03-06 DIAGNOSIS — G4733 Obstructive sleep apnea (adult) (pediatric): Secondary | ICD-10-CM | POA: Diagnosis present

## 2022-03-06 DIAGNOSIS — F419 Anxiety disorder, unspecified: Secondary | ICD-10-CM | POA: Diagnosis present

## 2022-03-06 LAB — URINALYSIS, ROUTINE W REFLEX MICROSCOPIC
Bilirubin Urine: NEGATIVE
Glucose, UA: NEGATIVE mg/dL
Hgb urine dipstick: NEGATIVE
Ketones, ur: NEGATIVE mg/dL
Leukocytes,Ua: NEGATIVE
Nitrite: NEGATIVE
Protein, ur: 30 mg/dL — AB
Specific Gravity, Urine: 1.005 (ref 1.005–1.030)
pH: 7 (ref 5.0–8.0)

## 2022-03-06 LAB — GLUCOSE, CAPILLARY
Glucose-Capillary: 200 mg/dL — ABNORMAL HIGH (ref 70–99)
Glucose-Capillary: 182 mg/dL — ABNORMAL HIGH (ref 70–99)
Glucose-Capillary: 211 mg/dL — ABNORMAL HIGH (ref 70–99)
Glucose-Capillary: 220 mg/dL — ABNORMAL HIGH (ref 70–99)

## 2022-03-06 LAB — CBC
HCT: 33.5 % — ABNORMAL LOW (ref 36.0–46.0)
Hemoglobin: 10.7 g/dL — ABNORMAL LOW (ref 12.0–15.0)
MCH: 24.8 pg — ABNORMAL LOW (ref 26.0–34.0)
MCHC: 31.9 g/dL (ref 30.0–36.0)
MCV: 77.5 fL — ABNORMAL LOW (ref 80.0–100.0)
Platelets: 339 10*3/uL (ref 150–400)
RBC: 4.32 MIL/uL (ref 3.87–5.11)
RDW: 16 % — ABNORMAL HIGH (ref 11.5–15.5)
WBC: 9.3 10*3/uL (ref 4.0–10.5)
nRBC: 0 % (ref 0.0–0.2)

## 2022-03-06 LAB — TSH: TSH: 4.577 u[IU]/mL — ABNORMAL HIGH (ref 0.350–4.500)

## 2022-03-06 LAB — RAPID URINE DRUG SCREEN, HOSP PERFORMED
Amphetamines: NOT DETECTED
Barbiturates: NOT DETECTED
Benzodiazepines: NOT DETECTED
Cocaine: NOT DETECTED
Opiates: POSITIVE — AB
Tetrahydrocannabinol: NOT DETECTED

## 2022-03-06 LAB — HEMOGLOBIN A1C
Hgb A1c MFr Bld: 8.8 % — ABNORMAL HIGH (ref 4.8–5.6)
Mean Plasma Glucose: 205.86 mg/dL

## 2022-03-06 LAB — BASIC METABOLIC PANEL
Anion gap: 11 (ref 5–15)
BUN: 13 mg/dL (ref 8–23)
CO2: 20 mmol/L — ABNORMAL LOW (ref 22–32)
Calcium: 8.9 mg/dL (ref 8.9–10.3)
Chloride: 106 mmol/L (ref 98–111)
Creatinine, Ser: 0.77 mg/dL (ref 0.44–1.00)
GFR, Estimated: >60 mL/min (ref >60)
Glucose, Bld: 193 mg/dL — ABNORMAL HIGH (ref 70–99)
Potassium: 3.4 mmol/L — ABNORMAL LOW (ref 3.5–5.1)
Sodium: 137 mmol/L (ref 135–145)

## 2022-03-06 LAB — VITAMIN B12: Vitamin B-12: 265 pg/mL (ref 180–914)

## 2022-03-06 LAB — TROPONIN I (HIGH SENSITIVITY): Troponin I (High Sensitivity): 11 ng/L (ref <18)

## 2022-03-06 MED ORDER — INSULIN ASPART 100 UNIT/ML IJ SOLN
0.0000 [IU] | Freq: Three times a day (TID) | INTRAMUSCULAR | Status: DC
  Administered 2022-03-06: 5 [IU] via SUBCUTANEOUS
  Administered 2022-03-06 (×2): 3 [IU] via SUBCUTANEOUS
  Administered 2022-03-07 (×2): 5 [IU] via SUBCUTANEOUS

## 2022-03-06 MED ORDER — FOLIC ACID 1 MG PO TABS
1.0000 mg | ORAL_TABLET | Freq: Every morning | ORAL | Status: DC
  Administered 2022-03-06 – 2022-03-07 (×2): 1 mg via ORAL
  Filled 2022-03-06 (×2): qty 1

## 2022-03-06 MED ORDER — ENOXAPARIN SODIUM 40 MG/0.4ML IJ SOSY
40.0000 mg | PREFILLED_SYRINGE | INTRAMUSCULAR | Status: DC
  Administered 2022-03-06: 40 mg via SUBCUTANEOUS
  Filled 2022-03-06: qty 0.4

## 2022-03-06 MED ORDER — IRBESARTAN 150 MG PO TABS
150.0000 mg | ORAL_TABLET | Freq: Every day | ORAL | Status: DC
  Administered 2022-03-06 – 2022-03-07 (×2): 150 mg via ORAL
  Filled 2022-03-06 (×2): qty 1

## 2022-03-06 MED ORDER — KETOROLAC TROMETHAMINE 15 MG/ML IJ SOLN
15.0000 mg | Freq: Three times a day (TID) | INTRAMUSCULAR | Status: DC | PRN
Start: 2022-03-06 — End: 2022-03-07

## 2022-03-06 MED ORDER — FUROSEMIDE 10 MG/ML IJ SOLN: 40.0000 mg | Freq: Every day | INTRAMUSCULAR | Status: DC

## 2022-03-06 MED ORDER — HYDRALAZINE HCL 20 MG/ML IJ SOLN
10.0000 mg | Freq: Four times a day (QID) | INTRAMUSCULAR | Status: DC | PRN
  Administered 2022-03-07: 10 mg via INTRAVENOUS
  Filled 2022-03-06: qty 1

## 2022-03-06 MED ORDER — NYSTATIN 100000 UNIT/GM EX OINT
TOPICAL_OINTMENT | Freq: Two times a day (BID) | CUTANEOUS | Status: DC
  Filled 2022-03-06: qty 15

## 2022-03-06 MED ORDER — SODIUM CHLORIDE 0.9 % IV SOLN
2.0000 g | INTRAVENOUS | Status: DC
  Administered 2022-03-06 – 2022-03-07 (×2): 2 g via INTRAVENOUS
  Filled 2022-03-06 (×2): qty 20

## 2022-03-06 MED ORDER — FUROSEMIDE 10 MG/ML IJ SOLN
40.0000 mg | Freq: Two times a day (BID) | INTRAMUSCULAR | Status: DC
Start: 2022-03-06 — End: 2022-03-07
  Administered 2022-03-06 – 2022-03-07 (×2): 40 mg via INTRAVENOUS
  Filled 2022-03-06 (×2): qty 4

## 2022-03-06 MED ORDER — APIXABAN 5 MG PO TABS
5.0000 mg | ORAL_TABLET | Freq: Two times a day (BID) | ORAL | Status: DC
  Administered 2022-03-06 – 2022-03-07 (×2): 5 mg via ORAL
  Filled 2022-03-06 (×2): qty 1

## 2022-03-06 MED ORDER — CYANOCOBALAMIN 1000 MCG/ML IJ SOLN
1000.0000 ug | Freq: Every day | INTRAMUSCULAR | Status: DC
Start: 2022-03-06 — End: 2022-03-07
  Administered 2022-03-06 – 2022-03-07 (×2): 1000 ug via SUBCUTANEOUS
  Filled 2022-03-06 (×2): qty 1

## 2022-03-06 MED ORDER — POTASSIUM CHLORIDE CRYS ER 20 MEQ PO TBCR
40.0000 meq | EXTENDED_RELEASE_TABLET | Freq: Once | ORAL | Status: AC
  Administered 2022-03-06: 40 meq via ORAL
  Filled 2022-03-06: qty 2

## 2022-03-06 MED ORDER — ACETAMINOPHEN 325 MG PO TABS
650.0000 mg | ORAL_TABLET | Freq: Four times a day (QID) | ORAL | Status: DC | PRN
  Administered 2022-03-07: 650 mg via ORAL
  Filled 2022-03-06: qty 2

## 2022-03-06 MED ORDER — GABAPENTIN 600 MG PO TABS
300.0000 mg | ORAL_TABLET | Freq: Three times a day (TID) | ORAL | Status: DC
Start: 2022-03-06 — End: 2022-03-07
  Administered 2022-03-06 – 2022-03-07 (×3): 300 mg via ORAL
  Filled 2022-03-06 (×3): qty 1

## 2022-03-06 MED ORDER — ONDANSETRON HCL 4 MG/2ML IJ SOLN: 4.0000 mg | Freq: Four times a day (QID) | INTRAMUSCULAR | Status: DC | PRN

## 2022-03-06 MED ORDER — METOPROLOL SUCCINATE ER 100 MG PO TB24
100.0000 mg | ORAL_TABLET | Freq: Every day | ORAL | Status: DC
  Administered 2022-03-06 – 2022-03-07 (×2): 100 mg via ORAL
  Filled 2022-03-06 (×2): qty 1

## 2022-03-06 MED ORDER — MELATONIN 5 MG PO TABS
5.0000 mg | ORAL_TABLET | Freq: Every evening | ORAL | Status: DC | PRN
  Administered 2022-03-06: 5 mg via ORAL
  Filled 2022-03-06: qty 1

## 2022-03-06 MED ORDER — ACETAMINOPHEN 325 MG PO TABS
650.0000 mg | ORAL_TABLET | Freq: Four times a day (QID) | ORAL | Status: DC | PRN
  Administered 2022-03-06: 650 mg via ORAL
  Filled 2022-03-06: qty 2

## 2022-03-06 MED ORDER — MORPHINE SULFATE (PF) 2 MG/ML IV SOLN
2.0000 mg | INTRAVENOUS | Status: DC | PRN
  Administered 2022-03-06: 2 mg via INTRAVENOUS
  Filled 2022-03-06: qty 1

## 2022-03-06 NOTE — Progress Notes (Signed)
Spoke w patient-spouse at bedside-much more calmer and cooperative than this morning. Spoke about remaining hospitalized till edema/cellulitis improve-she is agreeable. We also talked about anticoagulation-she is agreeable to be started on Eliquis-claims she has 2 sisters on it, and have tolerated it with no issues. Agreeable to try neurontin instead of narcotics for possible neuropathic pain of her B/L lower ext. Will reassess [email or web address]

## 2022-03-06 NOTE — Evaluation (Signed)
Physical Therapy Evaluation Patient Details Name: Amy Wright MRN: [id number] DOB: 10-Mar-1941 Today's Date: 03/06/2022  History of Present Illness  Judee Hennick is a 81 y.o. female admitted under observation 03/05/22 for evaluation of bilateral lower extremity edema, rash, and AMS including hallucinations PMH includes PMH HTN, HLD, persistent A-fib, T2DM, non-compliance with medications.  Clinical Impression  Pt admitted with above diagnosis. Pt was able to stand to RW with min assist and a second person for safety. Pt steady in static standing and was able to take pivotal steps to chair. Refused to ambulate further today due to fatigue. Pt did need assist to be cleaned as her purewick had leaked. Pt mentions that they plan to hire a caregiver for home as husband cannot provide the level that the pt needs. Will follow acutely.  Pt currently with functional limitations due to the deficits listed below (see PT Problem List). Pt will benefit from skilled PT to increase their independence and safety with mobility to allow discharge to the venue listed below.          Recommendations for follow up therapy are one component of a multi-disciplinary discharge planning process, led by the attending physician.  Recommendations may be updated based on patient status, additional functional criteria and insurance authorization.  Follow Up Recommendations Home health PT      Assistance Recommended at Discharge Intermittent Supervision/Assistance  Patient can return home with the following  A little help with walking and/or transfers;A little help with bathing/dressing/bathroom;Assist for transportation;Help with stairs or ramp for entrance;Assistance with cooking/housework    Equipment Recommendations None recommended by PT  Recommendations for Other Services       Functional Status Assessment Patient has had a recent decline in their functional status and demonstrates the ability to make  significant improvements in function in a reasonable and predictable amount of time.     Precautions / Restrictions Precautions Precautions: Fall Restrictions Weight Bearing Restrictions: No      Mobility  Bed Mobility Overal bed mobility: Needs Assistance Bed Mobility: Supine to Sit     Supine to sit: Min assist     General bed mobility comments: Needed a little assist wtih moving bil LEs off bed left more assist than right.  Also pt used rail on bed    Transfers Overall transfer level: Needs assistance Equipment used: Rolling walker (2 wheels) Transfers: Sit to/from Stand, Bed to chair/wheelchair/BSC Sit to Stand: Min assist, +2 safety/equipment Stand pivot transfers: Min assist, +2 safety/equipment         General transfer comment: Pt was able to stand with min assist and cues for hand placement. Needed assist to clean herself as purewick had leaked quite a bit.    Ambulation/Gait                  Stairs            Wheelchair Mobility    Modified Rankin (Stroke Patients Only)       Balance Overall balance assessment: Needs assistance Sitting-balance support: No upper extremity supported, Feet supported Sitting balance-Leahy Scale: Fair     Standing balance support: Bilateral upper extremity supported, During functional activity Standing balance-Leahy Scale: Poor Standing balance comment: relies on UE support on RW;                             Pertinent Vitals/Pain Pain Assessment Pain Assessment: Faces Faces Pain Scale: Hurts worst Pain Location:  bil LEs and generalized Pain Descriptors / Indicators: Grimacing, Guarding, Discomfort (cutting) Pain Intervention(s): Limited activity within patient's tolerance, Monitored during session, Repositioned    Home Living Family/patient expects to be discharged to:: Private residence Living Arrangements: Spouse/significant other Available Help at Discharge: Family;Available  PRN/intermittently (may hire someone to help pt) Type of Home: House Home Access: Stairs to enter Entrance Stairs-Rails: None Entrance Stairs-Number of Steps: 2-3   Home Layout: One level Home Equipment: Cane - single point;Shower seat;Adaptive equipment;Rolling Walker (2 wheels);Grab bars - tub/shower;Grab bars - toilet;Hand held shower head Additional Comments: Fell 5 x in last 2-3 months    Prior Function               Mobility Comments: Used RW around house ADLs Comments: Lately needed help with bahting and dressing.  Too painful to get in recently per pt     Hand Dominance   Dominant Hand: Right    Extremity/Trunk Assessment   Upper Extremity Assessment Upper Extremity Assessment: Defer to OT evaluation    Lower Extremity Assessment Lower Extremity Assessment: RLE deficits/detail;LLE deficits/detail RLE Deficits / Details: grossly 3/5 LLE Deficits / Details: grossly 3/5    Cervical / Trunk Assessment Cervical / Trunk Assessment: Normal  Communication   Communication: No difficulties  Cognition Arousal/Alertness: Awake/alert Behavior During Therapy: WFL for tasks assessed/performed Overall Cognitive Status: Within Functional Limits for tasks assessed                                          General Comments General comments (skin integrity, edema, etc.): Hr to 138 bpm with activity.    Exercises     Assessment/Plan    PT Assessment Patient needs continued PT services  PT Problem List Decreased activity tolerance;Decreased balance;Decreased mobility;Decreased knowledge of use of DME;Decreased safety awareness;Decreased knowledge of precautions;Obesity;Decreased skin integrity       PT Treatment Interventions DME instruction;Gait training;Functional mobility training;Therapeutic activities;Therapeutic exercise;Balance training;Patient/family education;Stair training    PT Goals (Current goals can be found in the Care Plan section)   Acute Rehab PT Goals Patient Stated Goal: to go home and hire a caregiver PT Goal Formulation: With patient Time For Goal Achievement: 03/20/22 Potential to Achieve Goals: Good    Frequency Min 3X/week     Co-evaluation               AM-PAC PT "6 Clicks" Mobility  Outcome Measure Help needed turning from your back to your side while in a flat bed without using bedrails?: A Little Help needed moving from lying on your back to sitting on the side of a flat bed without using bedrails?: A Little Help needed moving to and from a bed to a chair (including a wheelchair)?: Total Help needed standing up from a chair using your arms (e.g., wheelchair or bedside chair)?: Total Help needed to walk in hospital room?: Total Help needed climbing 3-5 steps with a railing? : Total 6 Click Score: 10    End of Session Equipment Utilized During Treatment: Gait belt Activity Tolerance: Patient limited by fatigue Patient left: in chair;with call bell/phone within reach;with chair alarm set Nurse Communication: Mobility status PT Visit Diagnosis: Muscle weakness (generalized) (M62.81)    Time: [id number] PT Time Calculation (min) (ACUTE ONLY): 42 min   Charges:   PT Evaluation $PT Eval Moderate Complexity: 1 Mod PT Treatments $Gait Training: 8-22 mins $Self Care/Home  Management: 8-22        [facility] M,PT Acute Rehab Services [phone]   Bevelyn Buckles 03/06/2022, 12:27 PM

## 2022-03-06 NOTE — ED Notes (Signed)
3W called to attempt report. Still assigning RN to receive this Pt.

## 2022-03-06 NOTE — TOC Initial Note (Signed)
Transition of Care [facility]) - Initial/Assessment Note    Patient Details  Name: Amy Wright MRN: [id number] Date of Birth: [birth year]  Transition of Care [facility] CM/SW Contact:    Durenda Guthrie, RN Phone Number: 03/06/2022, 10:30 AM  Clinical Narrative:                 Transition of Care Screening Note:  Transition of Care Department [facility]) has reviewed patient and no TOC needs have been identified at this time. We will continue to monitor patient advancement through Interdisciplinary progressions. If new patient transition needs arise, please place a consult.         Patient Goals and CMS Choice        Expected Discharge Plan and Services                                                Prior Living Arrangements/Services                       Activities of Daily Living      Permission Sought/Granted                  Emotional Assessment              Admission diagnosis:  Leg edema [R60.0] AMS (altered mental status) [R41.82] Patient Active Problem List   Diagnosis Date Noted   Lower extremity edema 03/06/2022   BMI 37.0-37.9, adult 03/06/2022   Candidiasis of skin 03/06/2022   AMS (altered mental status) 03/05/2022   Coagulopathy (HCC) 01/29/2021   Acute blood loss anemia 07/27/2019   Left-sided epistaxis    Long term current use of anticoagulant therapy    Atrial fibrillation with RVR (HCC) 07/25/2019   Breast pain, left 12/03/2016   Hx of long term use of blood thinners 04/01/2016   Uncontrolled type 2 diabetes mellitus with hyperglycemia (HCC) 11/26/2015   Microscopic hematuria 09/19/2015   Left foot pain 08/22/2015   Obesity 02/15/2015   Frequency of urination 06/13/2014   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   Depression 03/22/2014   Insomnia 03/22/2014   Vaginitis 03/22/2014   Hyperlipemia 03/22/2014   Hypertriglyceridemia 03/22/2014   Atrial fibrillation (HCC) 08/09/2013   Obstructive sleep apnea syndrome 08/07/2013    Persistent atrial fibrillation (HCC) 03/29/2013   Anticoagulated on Coumadin 03/29/2013   DM2 (diabetes mellitus, type 2) (HCC) 03/29/2013   Mixed hyperlipidemia 03/29/2013   Essential hypertension 03/29/2013   PCP:  Angelica Chessman, MD Pharmacy:   [phone] [place], [place] - [address] [place] [address] Phone: [phone] Fax: [phone]     Social Determinants of Health (SDOH) Interventions    Readmission Risk Interventions    07/27/2019   11:56 AM 07/26/2019    3:25 PM  Readmission Risk Prevention Plan  Post Dischage Appt  Complete  Medication Screening  Complete  Transportation Screening Complete Complete  PCP or Specialist Appt within 5-7 Days Complete   Home Care Screening Complete   Medication Review (RN CM) Complete

## 2022-03-06 NOTE — Assessment & Plan Note (Signed)
-  elevated likely due to medication non-compliance -currently on IV Lasix.  -resume home medication pending med rec

## 2022-03-06 NOTE — TOC Benefit Eligibility Note (Signed)
Patient Product/process development scientist completed.    The patient is currently admitted and upon discharge could be taking Eliquis 5 mg.  The current 30 day co-pay is, $149.97 due to being in Coverage Gap (donut hole).   The patient is insured through [employer]    Roland Earl, CPhT Pharmacy Patient Advocate Specialist [facility] Patient Advocate Team Direct Number: [phone]  Fax: [phone]

## 2022-03-06 NOTE — Assessment & Plan Note (Signed)
Mild tachycardic but rate controlled.  -INR is subtherpeutic. Hold Coumadin for now pending results of CT head.

## 2022-03-06 NOTE — Progress Notes (Signed)
Inpatient Diabetes Program Recommendations  AACE/ADA: New Consensus Statement on Inpatient Glycemic Control (2015)  Target Ranges:  Prepandial:   less than 140 mg/dL      Peak postprandial:   less than 180 mg/dL (1-2 hours)      Critically ill patients:  140 - 180 mg/dL   Lab Results  Component Value Date   GLUCAP 182 (H) 03/06/2022   HGBA1C 8.8 (H) 03/06/2022    Review of Glycemic Control  Latest Reference Range & Units 03/06/22 06:42 03/06/22 11:56  Glucose-Capillary 70 - 99 mg/dL 158 (H) 309 (H)   Diabetes history: DM 2 Outpatient Diabetes medications:  Glucotrol 10 mg daily Lantus 55 units daily ([facility]) Metformin 1000 mg bid Freestyle Libre (Ozempic- on home list but PCP states that patient could not tolerate this medication in note on 11/26/21) Current orders for Inpatient glycemic control:  Novolog moderate tid with meals   Inpatient Diabetes Program Recommendations:    Please consider adding Semglee 20 units daily since patient was on basal insulin prior to admit.  Of note, A1C in March of 2023 was 9.7% and is now 8.8% (improved).  Will follow.    Thanks,  Beryl Meager, RN, BC-ADM Inpatient Diabetes Coordinator Pager [phone]  (8a-5p)

## 2022-03-06 NOTE — Assessment & Plan Note (Signed)
BMI of 37 

## 2022-03-06 NOTE — Progress Notes (Signed)
OT Cancellation Note  Patient Details Name: Amy Wright MRN: [id number] DOB: Aug 06, 1941   Cancelled Treatment:    Reason Eval/Treat Not Completed: Patient at procedure or test/ unavailable (vascular lab)  Zyaira Vejar M Josue Falconi Joesiah Lonon MSOT, OTR/L Acute Rehab Office: [phone] 03/06/2022, 3:30 PM

## 2022-03-06 NOTE — Progress Notes (Signed)
Bilateral lower extremity venous duplex study completed. Please see CV Proc for preliminary results.  Attila Mccarthy BS, RVT 03/06/2022 2:11 PM

## 2022-03-06 NOTE — Progress Notes (Signed)
ANTICOAGULATION CONSULT NOTE - Initial Consult  Pharmacy Consult for afib Indication: atrial fibrillation  Allergies  Allergen Reactions   Rosuvastatin Calcium Other (See Comments)    myalgias    Statins Other (See Comments)    myalgia   Amoxicillin Rash   Atorvastatin Other (See Comments)    Back pain    Ezetimibe-Simvastatin Other (See Comments)    Back pain    Penicillins Itching, Swelling and Rash    Did it involve swelling of the face/tongue/throat, SOB, or low BP? No Did it involve sudden or severe rash/hives, skin peeling, or any reaction on the inside of your mouth or nose? Yes Did you need to seek medical attention at a hospital or doctor's office? No When did it last happen?   young adult    If all above answers are "NO", may proceed with cephalosporin use.    Patient Measurements: Height: 5\' 5"  (165.1 cm) Weight: 102.1 kg (225 lb) IBW/kg (Calculated) : 57 Heparin Dosing Weight:   Vital Signs: Temp: 99.4 F (37.4 C) (06/29 1155) Temp Source: Oral (06/29 1155) BP: 149/76 (06/29 1155) Pulse Rate: 99 (06/29 1155)  Labs: Recent Labs    03/05/22 2153 03/05/22 2330 03/06/22 0427  HGB 10.4*  --  10.7*  HCT 34.0*  --  33.5*  PLT 342  --  339  LABPROT 15.5*  --   --   INR 1.2  --   --   CREATININE 0.77  --  0.77  TROPONINIHS 11 11  --     Estimated Creatinine Clearance: 67.5 mL/min (by C-G formula based on SCr of 0.77 mg/dL).   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 Central jet. Aortic Valve appears to be mildly sclerotic. Trace aortic regurgitation.EF >55%    Medications:  Medications Prior to Admission   Medication Sig Dispense Refill Last Dose   acetaminophen (TYLENOL) 500 MG tablet Take 1,000 mg by mouth every 6 (six) hours as needed for headache or mild pain (pain).   unknown   Ascorbic Acid (VITAMIN C) 1000 MG tablet Take 2,000 mg by mouth daily.   unknown   buPROPion (WELLBUTRIN XL) 150 MG 24 hr tablet Take 150 mg by mouth every morning.   unknown   cholecalciferol (VITAMIN D3) 25 MCG (1000 UT) tablet Take 1,000 Units by mouth daily.   unknown   diltiazem (CARDIZEM CD) 360 MG 24 hr capsule TAKE 1 CAPSULE BY MOUTH EVERY MORNING. Please make overdue appt with Dr. 05/02/11 for June 2023 for future refills. Thank you 1st attempt 90 capsule 0 unknown   ezetimibe (ZETIA) 10 MG tablet Take 10 mg by mouth at bedtime.    unknown   glipiZIDE (GLUCOTROL) 10 MG tablet Take 10 mg by mouth every morning.    unknown   LANTUS SOLOSTAR 100 UNIT/ML Solostar Pen Inject 55 Units into the skin daily. [facility]- [facility] state that last prescription that was picked up was ordered as Lantus 55 units daily   unknown   LORazepam (ATIVAN) 0.5 MG tablet Take 0.5 mg by mouth daily as needed for anxiety (jitters).  unknown   magnesium oxide (MAG-OX) 400 MG tablet Take 1 tablet (400 mg total) by mouth daily. 30 tablet 5 unknown   metFORMIN (GLUCOPHAGE) 1000 MG tablet Take 1,000 mg by mouth 2 (two) times daily with a meal.   unknown   metoprolol succinate (TOPROL-XL) 50 MG 24 hr tablet TAKE 2 TABLETS IN THE MORNING. TAKE 1 TABLET AT NIGHT. PLEASE KEEP UPCOMING APPT IN APRIL 2022 BEFORE ANYMORE REFILLS. THANK YOU 270 tablet 3 unknown   MYRBETRIQ 50 MG TB24 tablet Take 50 mg by mouth daily.   unknown   potassium chloride (KLOR-CON) 10 MEQ tablet TAKE 1 TABLET BY MOUTH EVERY DAY 90 tablet 3 unknown   sertraline (ZOLOFT) 100 MG tablet Take 100 mg by mouth every morning.   unknown   telmisartan (MICARDIS) 80 MG tablet Take 80 mg by mouth daily.   unknown   warfarin (COUMADIN) 5 MG tablet TAKE 1-2 TABLETS DAILY OR AS PRESCRIBED BY  COUMADIN CLINIC 30 tablet 1 unknown   zolpidem (AMBIEN) 5 MG tablet Take 5 mg by mouth at bedtime.   unknown   ACCU-CHEK COMPACT PLUS test strip CHECK BLOOD SUGAR TWICE DAILY OR AS DIRECTED  0    folic acid (FOLVITE) 1 MG tablet Take 1 mg by mouth every morning.       furosemide (LASIX) 40 MG tablet Take 40 mg by mouth daily as needed for edema.      oxyCODONE (ROXICODONE) 5 MG immediate release tablet Take 1 tablet (5 mg total) by mouth every 4 (four) hours as needed for severe pain. 10 tablet 0    vitamin B-12 (CYANOCOBALAMIN) 1000 MCG tablet Take 1,000 mcg by mouth every morning.      VITAMIN E PO Take 1 capsule by mouth once a week.      Scheduled:   apixaban  5 mg Oral BID   cyanocobalamin  1,000 mcg Subcutaneous Daily   folic acid  1 mg Oral q morning   furosemide  40 mg Intravenous Q12H   gabapentin  300 mg Oral TID   insulin aspart  0-15 Units Subcutaneous TID WC   irbesartan  150 mg Oral Daily   metoprolol succinate  100 mg Oral Daily   nystatin ointment   Topical BID    Assessment: Pt was supposedly on coumadin for her afib but INR was 1.2 on admission. She refused to go back on coumadin but agreeable to apixaban.   Scr<1.5 Age<82 Wt >60kg  Goal of Therapy:   Monitor platelets by anticoagulation protocol: Yes   Plan:  Dc lovenox Apixaban 5mg  PO BID Rx will follow peripherally  , PharmD, BCIDP, AAHIVP, CPP Infectious Disease Pharmacist 03/06/2022 3:51 PM

## 2022-03-06 NOTE — Assessment & Plan Note (Signed)
-  secondary to medication noncompliance.  -She was initially given IV Rocephin for possible cellulitis however suspect bilateral erythema due to chronic venous stasis changes.  Will discontinue IV antibiotics -Continue daily IV Lasix -will need to follow with lymphedema clinic output

## 2022-03-06 NOTE — TOC Progression Note (Signed)
Transition of Care [facility]) - Progression Note    Patient Details  Name: Amy Wright MRN: [id number] Date of Birth: 11-Jul-1941  Transition [facility] CM/SW [place], [place] Phone Number: 03/06/2022, 3:31 PM  Clinical Narrative:   CSW alerted by MD that he had discussion with daughter about concerns about patient's living situation. CSW spoke with daughter, Horris Latino, to gather information. Per Horris Latino, patient and her husband are physically unable to care for the home anymore and it is dirty and unkempt, they are not managing it and really need caregiver support but so far have not been agreeable. Horris Latino also asking about additional assistance in the home for med management, as the patient and her husband are unable to keep track of her medicines and that is part of the reason why she is in the hospital. CSW explained that home health can be set up, but CSW does not have resources for cleaning services; possible that home health social worker would be able to assist once she's home. Per Horris Latino, they have struggled with getting the patient on board with getting help at home because she still wants to feel independent, so Horris Latino recommended talking to the patient like it was all her idea and giving her choices. Horris Latino said it would not help the conversation if CSW shared that she had talked with the daughter already.  CSW then met with patient to discuss needs at home, and patient indicated that she would like a caregiver to help with cleaning things up and helping her with her medicines. CSW discussed setting up home health, and patient initially said she would set it up herself once she got home. CSW explained benefits of setting it up before leaving the hospital, and patient in agreement. CSW to set up home health for patient prior to discharge from the hospital. Patient already has a RW at home.     Expected Discharge Plan: [place] Barriers to Discharge:  Continued Medical Work up  Expected Discharge Plan and Services Expected Discharge Plan: [place] Choice: [place] arrangements for the past 2 months: Single Family Home                                       Social Determinants of Health (SDOH) Interventions    Readmission Risk Interventions    07/27/2019   11:56 AM 07/26/2019    3:25 PM  Readmission Risk Prevention Plan  Post Dischage Appt  Complete  Medication Screening  Complete  Transportation Screening Complete Complete  PCP or Specialist Appt within 5-7 Days Complete   Home Care Screening Complete   Medication Review (RN CM) Complete

## 2022-03-06 NOTE — ED Notes (Signed)
ED TO INPATIENT HANDOFF REPORT  ED Nurse Name and Phone #:  [phone] [facility] RN  S Name/Age/Gender Amy Wright 81 y.o. female Room/Bed: 022C/022C  Code Status   Code Status: Prior  Home/SNF/Other Home Patient oriented to: self, place, time, and situation Is this baseline? Yes   Triage Complete: Triage complete  Chief Complaint AMS (altered mental status) [R41.82]  Triage Note BIB [facility] from home. Pt reports leg swelling for 5 months- became too painful tonight so called EMS. Also reports red rash and pain under abdominal fold. Uses walker at home. Aox4.    Allergies Allergies  Allergen Reactions   Rosuvastatin Calcium Other (See Comments)    myalgias    Statins Other (See Comments)    myalgia   Amoxicillin Rash   Atorvastatin Other (See Comments)    Back pain    Ezetimibe-Simvastatin Other (See Comments)    Back pain    Penicillins Itching, Swelling and Rash    Did it involve swelling of the face/tongue/throat, SOB, or low BP? No Did it involve sudden or severe rash/hives, skin peeling, or any reaction on the inside of your mouth or nose? Yes Did you need to seek medical attention at a hospital or doctor's office? No When did it last happen?   young adult    If all above answers are "NO", may proceed with cephalosporin use.    Level of Care/Admitting Diagnosis ED Disposition     ED Disposition  Admit   Condition  --   Comment  Hospital Area: [facility] [[id number]]  Level of Care: Telemetry Medical [104]  May place patient in observation at [facility] or [facility] if equivalent level of care is available:: No  Covid Evaluation: Asymptomatic - no recent exposure (last 10 days) testing not required  Diagnosis: AMS (altered mental status) [[id number]]  Admitting Physician: Anselm Jungling [[id number]]  Attending Physician: Anselm Jungling [[id number]]          B Medical/Surgery History Past Medical History:  Diagnosis Date   Anxiety    Chronic  insomnia    Hyperlipidemia    Hypertension    Hypertriglyceridemia    Obesity    OSA on CPAP    Persistent atrial fibrillation (HCC) 11/07/14   Chads2vasc score of at least 4   Type II diabetes mellitus (HCC)    Valvular sclerosis 04/30/11   ECHO-Mitral Valve- mild to moderate mitral regurgitation. Central jet. Aortic Valve appears to be mildly sclerotic. Trace aortic regurgitation.EF >55%   Past Surgical History:  Procedure Laterality Date   APPENDECTOMY  1971   CARDIOVERSION N/A 06/23/2013   Procedure: CARDIOVERSION;  Surgeon: Lennette Bihari, MD;  Location: [facility] ENDOSCOPY;  Service: Cardiovascular;  Laterality: N/A;   CATARACT EXTRACTION W/ INTRAOCULAR LENS IMPLANT Bilateral    CESAREAN SECTION  1971   PLACEMENT OF BREAST IMPLANTS Bilateral 1985     A IV Location/Drains/Wounds Patient Lines/Drains/Airways Status     Active Line/Drains/Airways     Name Placement date Placement time Site Days   Peripheral IV 03/05/22 18 G Right Antecubital 03/05/22  2150  Antecubital  1   External Urinary Catheter 03/05/22  2157  --  1            Intake/Output Last 24 hours No intake or output data in the 24 hours ending 03/06/22 0028  Labs/Imaging Results for orders placed or performed during the hospital encounter of 03/05/22 (from the past 48 hour(s))  Comprehensive metabolic panel  Status: Abnormal   Collection Time: 03/05/22  9:53 PM  Result Value Ref Range   Sodium 137 135 - 145 mmol/L   Potassium 3.9 3.5 - 5.1 mmol/L   Chloride 104 98 - 111 mmol/L   CO2 23 22 - 32 mmol/L   Glucose, Bld 183 (H) 70 - 99 mg/dL    Comment: Glucose reference range applies only to samples taken after fasting for at least 8 hours.   BUN 16 8 - 23 mg/dL   Creatinine, Ser 2.42 0.44 - 1.00 mg/dL   Calcium 9.2 8.9 - 35.3 mg/dL   Total Protein 7.0 6.5 - 8.1 g/dL   Albumin 3.1 (L) 3.5 - 5.0 g/dL   AST 26 15 - 41 U/L   ALT 18 0 - 44 U/L   Alkaline Phosphatase 85 38 - 126 U/L   Total Bilirubin 1.2  0.3 - 1.2 mg/dL   GFR, Estimated >61 >44 mL/min    Comment: (NOTE) Calculated using the CKD-EPI Creatinine Equation (2021)    Anion gap 10 5 - 15    Comment: Performed at [facility], [address] [place], [place] [address]  CBC with Differential     Status: Abnormal   Collection Time: 03/05/22  9:53 PM  Result Value Ref Range   WBC 9.5 4.0 - 10.5 K/uL   RBC 4.32 3.87 - 5.11 MIL/uL   Hemoglobin 10.4 (L) 12.0 - 15.0 g/dL   HCT 08.6 (L) 76.1 - 95.0 %   MCV 78.7 (L) 80.0 - 100.0 fL   MCH 24.1 (L) 26.0 - 34.0 pg   MCHC 30.6 30.0 - 36.0 g/dL   RDW 93.2 (H) 67.1 - 24.5 %   Platelets 342 150 - 400 K/uL   nRBC 0.0 0.0 - 0.2 %   Neutrophils Relative % 72 %   Neutro Abs 6.8 1.7 - 7.7 K/uL   Lymphocytes Relative 16 %   Lymphs Abs 1.5 0.7 - 4.0 K/uL   Monocytes Relative 11 %   Monocytes Absolute 1.0 0.1 - 1.0 K/uL   Eosinophils Relative 0 %   Eosinophils Absolute 0.0 0.0 - 0.5 K/uL   Basophils Relative 1 %   Basophils Absolute 0.1 0.0 - 0.1 K/uL   Immature Granulocytes 0 %   Abs Immature Granulocytes 0.04 0.00 - 0.07 K/uL    Comment: Performed at [facility], [address], [place], [place] [address]  Troponin I (High Sensitivity)     Status: None   Collection Time: 03/05/22  9:53 PM  Result Value Ref Range   Troponin I (High Sensitivity) 11 <18 ng/L    Comment: (NOTE) Elevated high sensitivity troponin I (hsTnI) values and significant  changes across serial measurements may suggest ACS but many other  chronic and acute conditions are known to elevate hsTnI results.  Refer to the "Links" section for chest pain algorithms and additional  guidance. Performed at [facility], [address] [place], [place] [address]   Brain natriuretic peptide     Status: Abnormal   Collection Time: 03/05/22  9:53 PM  Result Value Ref Range   B Natriuretic Peptide 253.4 (H) 0.0 - 100.0 pg/mL    Comment: Performed at [facility], [address] [place], [place], [place]  [address]  Protime-INR     Status: Abnormal   Collection Time: 03/05/22  9:53 PM  Result Value Ref Range   Prothrombin Time 15.5 (H) 11.4 - 15.2 seconds   INR 1.2  0.8 - 1.2    Comment: (NOTE) INR goal varies based on device and disease states. Performed at [facility], [address] [place], [place] [address]   Troponin I (High Sensitivity)     Status: None   Collection Time: 03/05/22 11:30 PM  Result Value Ref Range   Troponin I (High Sensitivity) 11 <18 ng/L    Comment: (NOTE) Elevated high sensitivity troponin I (hsTnI) values and significant  changes across serial measurements may suggest ACS but many other  chronic and acute conditions are known to elevate hsTnI results.  Refer to the "Links" section for chest pain algorithms and additional  guidance. Performed at [facility], [address] [place], [place] [address]   Rapid urine drug screen (hospital performed)     Status: Abnormal   Collection Time: 03/05/22 11:41 PM  Result Value Ref Range   Opiates POSITIVE (A) NONE DETECTED   Cocaine NONE DETECTED NONE DETECTED   Benzodiazepines NONE DETECTED NONE DETECTED   Amphetamines NONE DETECTED NONE DETECTED   Tetrahydrocannabinol NONE DETECTED NONE DETECTED   Barbiturates NONE DETECTED NONE DETECTED    Comment: (NOTE) DRUG SCREEN FOR MEDICAL PURPOSES ONLY.  IF CONFIRMATION IS NEEDED FOR ANY PURPOSE, NOTIFY LAB WITHIN 5 DAYS.  LOWEST DETECTABLE LIMITS FOR URINE DRUG SCREEN Drug Class                     Cutoff (ng/mL) Amphetamine and metabolites    1000 Barbiturate and metabolites    200 Benzodiazepine                 200 Tricyclics and metabolites     300 Opiates and metabolites        300 Cocaine and metabolites        300 THC                            50 Performed at [facility], [address], [place], [place] [address]   Urinalysis, Routine w reflex microscopic Urine, Clean Catch     Status: Abnormal   Collection Time: 03/05/22 11:41  PM  Result Value Ref Range   Color, Urine COLORLESS (A) YELLOW   APPearance CLEAR CLEAR   Specific Gravity, Urine 1.005 1.005 - 1.030   pH 7.0 5.0 - 8.0   Glucose, UA NEGATIVE NEGATIVE mg/dL   Hgb urine dipstick NEGATIVE NEGATIVE   Bilirubin Urine NEGATIVE NEGATIVE   Ketones, ur NEGATIVE NEGATIVE mg/dL   Protein, ur 30 (A) NEGATIVE mg/dL   Nitrite NEGATIVE NEGATIVE   Leukocytes,Ua NEGATIVE NEGATIVE   RBC / HPF 0-5 0 - 5 RBC/hpf   WBC, UA 6-10 0 - 5 WBC/hpf   Bacteria, UA RARE (A) NONE SEEN   Squamous Epithelial / LPF 0-5 0 - 5   Mucus PRESENT     Comment: Performed at [facility], [address], [place], [place] [address]   No results found.  Pending Labs Unresulted Labs (From admission, onward)    None       Vitals/Pain Today's Vitals   03/05/22 2119 03/05/22 2230 03/06/22 0000 03/06/22 0023  BP:  (!) 197/127 (!) 159/88   Pulse:  (!) 108 (!) 116   Resp:  (!) 22 18   Temp:      TempSrc:      SpO2: 100% 96% 97%   Weight: 102.1 kg  Height: 5\' 5"  (1.651 m)     PainSc:    5     Isolation Precautions No active isolations  Medications Medications  morphine (PF) 4 MG/ML injection 4 mg (4 mg Intravenous Given 03/05/22 2205)  furosemide (LASIX) injection 40 mg (40 mg Intravenous Given 03/05/22 2206)  cefTRIAXone (ROCEPHIN) 1 g in sodium chloride 0.9 % 100 mL IVPB (0 g Intravenous Stopped 03/05/22 2337)    Mobility walks with device Moderate fall risk   Focused Assessments Neuro Assessment Handoff:  Swallow screen pass?    NPO         Neuro Assessment: Exceptions to WDL Neuro Checks:      Last Documented NIHSS Modified Score:   Has TPA been given? No If patient is a Neuro Trauma and patient is going to OR before floor call report to 4N Charge nurse: [phone] or [phone]   R Recommendations: See Admitting Provider Note  Report given to:   Additional Notes:

## 2022-03-06 NOTE — Plan of Care (Signed)
  Problem: Education: Goal: Knowledge of General Education information will improve Description Including pain rating scale, medication(s)/side effects and non-pharmacologic comfort measures Outcome: Progressing   Problem: Health Behavior/Discharge Planning: Goal: Ability to manage health-related needs will improve Outcome: Progressing   

## 2022-03-06 NOTE — Consult Note (Signed)
[facility] Psychiatry New Face-to-Face Psychiatric Evaluation   Service Date: March 06, 2022 LOS:  LOS: 0 days    Assessment  Amy Wright is a 81 y.o. female admitted medically on 03/05/2022  9:04 PM for AMS and cellulitis. She carries the psychiatric diagnoses of anxiety and depression and has a past medical history of  afib on coumadin. Psychiatry was consulted for depression, paranoia, and hallucinations by Dr. Jerral Ralph.   Hallucinations, likely due to medication polypharmacy Patient notably does not meet criteria for mood, substance use, or psychotic disorder.  No acute safety concerns or need for inpatient psychiatric hospitalization.  The patient has been experiencing hallucinations during the day in the context of Ambien, Ativan, and oxycodone use.  Do not feel there is a role for antipsychotic management in these hallucinations at this time.  The next best step forward is discontinuation of the offending agents and observing response.  Diagnoses:  Active Hospital problems: Principal Problem:   AMS (altered mental status) Active Problems:   Persistent atrial fibrillation (HCC)   DM2 (diabetes mellitus, type 2) (HCC)   Essential hypertension   Lower extremity edema   BMI 37.0-37.9, adult   Candidiasis of skin   Encephalopathy acute     Plan  ## Safety and Observation Level:  - Based on my clinical evaluation, I estimate the patient to be at low risk of self harm in the current setting - At this time, we recommend a routine level of observation. This decision is based on my review of the chart including patient's history and current presentation, interview of the patient, mental status examination, and consideration of suicide risk including evaluating suicidal ideation, plan, intent, suicidal or self-harm behaviors, risk factors, and protective factors. This judgment is based on our ability to directly address suicide risk, implement suicide prevention strategies and  develop a safety plan while the patient is in the clinical setting. Please contact our team if there is a concern that risk level has changed.  ## Medications:  --Continue medications at present dosages with the exception of the following medications which should be discontinued.   -Discontinue Ambien  -Discontinue Ativan  -Discontinue oxycodone  ## Medical Decision Making Capacity:  Not formally assessed  ## Further Work-up:  -- most recent EKG on 6/28 had QtC of 416 -- Pertinent labwork reviewed earlier this admission includes: UDS with opioids  ## Disposition:  --TBD, no need for inpatient psychiatry  ##Legal Status VOLUNTARY  Thank you for this consult request. Recommendations have been communicated to the primary team.  We will sign off at this time.   Amy Reichert, MD   NEW history  Relevant Aspects of Hospital Course:  Admitted on 03/05/2022 for altered mental status. Resolved on 6/29.   Patient Report:  Spoke to pt in AM around 10:30. Per nurse, had been alert & oriented on assessment this AM. Daughter Amy Wright in room - pt consents to have her remain. Has 3 daughters. Youngest daughter apparently in [place] - pt had forgotten this and had to be reminded by Amy Wright.    Patient reports that her mental health was "all right until all this started" and her system is messed up because she hasn't been able to be compliant with her medication; proceeds to list several medical problems. Indicates she self-dc warfarin due to nosebleeds. Oriented to self, situation - just had anniversary. Oriented to year, knows president and struggles naming prior presidents but can describe Trump. Fairly concrete. Able to follow simple commands -  SAVEAHAART with no issues. CAM-ICU with minor difficulties ("a person holding the hammer pounds the nail") but generally understood the questions. Focused on lymphedema through interview. Endorses intermittent compliance with meds.    Knows she takes  sertraline for her mood - takes it pretty often because it helps her feel better. Think she is taking it most days. Wellbutrin is on chart but she denies taking over the past few years. Taking Remus Loffler "pretty regular"  --> feels she is dependent on this for sleep. Now taking an OTC blue pill from [facility] (melatonin, not diphenhydramine) to help sleep, also taking lorazepam to help sleep (taking old bottle). Pain has been bad enough to keep her up at night, sleeping during the day, etc.    Mood overall OK  - has a dog she loves, a husband she loves (although his health is poor). Husband takes care of groceries and cooking - he is a good cook. Used to enjoy dancing, now less able. Denies history of suicidal thoughts ("I like me too much") although it sounds like she has struggled with feelings of worthlessness, hopelessness, etc "I feel sad for people that have to do that".    Daughter tries to prompt pt throughout interview especially around hallucinations. Apparently was on the phone yesterday with youngest daughter saying middle daughter was in the house (she wasn't). Sounds like mostly non-bizarre.    Used to drink fairly heavily, not recently  No cigarettes (current or historic) Denies using prescription drugs that aren't hers.    Brief manic screen (-) - some impulsive shopping but overall negative   Brief PTSD screen (-)   Pt denies AH/VH - says this happens when she watches movies.    No major psych hx, 1 marriage counselor visit lifetime    No family hx mental illness, father drank socially. One brother died under suspicious circumstances (pt states stumbled on the porch with a gun, daughter states he comitted suicide)   ROS:  As above  Collateral information:  From daughter in the room  Psychiatric History:  Information collected from patient, family, chart review  Family psych history: as above   Social History:  As above  Family History:  The patient's family history  includes Cancer - Lung in her father; Cancer - Other in her maternal grandmother; Cardiomyopathy in her mother; Diabetes in her maternal grandfather; Heart attack in her mother; Stroke in her paternal grandfather.  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 Central jet. Aortic Valve appears to be mildly sclerotic. Trace aortic regurgitation.EF >55%    Surgical History: Past Surgical History:  Procedure Laterality Date   APPENDECTOMY  1971   CARDIOVERSION N/A 06/23/2013   Procedure: CARDIOVERSION;  Surgeon: Lennette Bihari, MD;  Location: [facility] ENDOSCOPY;  Service: Cardiovascular;  Laterality: N/A;   CATARACT EXTRACTION W/ INTRAOCULAR LENS IMPLANT Bilateral    CESAREAN SECTION  1971   PLACEMENT OF BREAST IMPLANTS Bilateral 1985    Medications:   Current Facility-Administered Medications:    acetaminophen (TYLENOL) tablet 650 mg, 650 mg, Oral, Q6H PRN, Ghimire, Werner Lean, MD  apixaban (ELIQUIS) tablet 5 mg, 5 mg, Oral, BID, Pham, Minh Q, RPH-CPP, 5 mg at 03/06/22 1743   cefTRIAXone (ROCEPHIN) 2 g in sodium chloride 0.9 % 100 mL IVPB, 2 g, Intravenous, Q24H, Ghimire, Shanker M, MD, Last Rate: 200 mL/hr at 03/06/22 1002, 2 g at 03/06/22 1002   cyanocobalamin ((VITAMIN B-12)) injection 1,000 mcg, 1,000 mcg, Subcutaneous, Daily, Ghimire, Werner Lean, MD, 1,000 mcg at 03/06/22 1305   folic acid (FOLVITE) tablet 1 mg, 1 mg, Oral, q morning, Ghimire, Werner Lean, MD, 1 mg at 03/06/22 0956   furosemide (LASIX) injection 40 mg, 40 mg, Intravenous, Q12H, Ghimire, Shanker M, MD, 40 mg at 03/06/22 1743   gabapentin (NEURONTIN) tablet 300 mg, 300 mg, Oral, TID, Ghimire,  Shanker M, MD, 300 mg at 03/06/22 1743   hydrALAZINE (APRESOLINE) injection 10 mg, 10 mg, Intravenous, Q6H PRN, Ghimire, Werner Lean, MD   insulin aspart (novoLOG) injection 0-15 Units, 0-15 Units, Subcutaneous, TID WC, Tu, Ching T, DO, 5 Units at 03/06/22 1743   irbesartan (AVAPRO) tablet 150 mg, 150 mg, Oral, Daily, Ghimire, Shanker M, MD, 150 mg at 03/06/22 0955   ketorolac (TORADOL) 15 MG/ML injection 15 mg, 15 mg, Intravenous, Q8H PRN, Ghimire, Shanker M, MD   melatonin tablet 5 mg, 5 mg, Oral, QHS PRN, Ghimire, Werner Lean, MD   metoprolol succinate (TOPROL-XL) 24 hr tablet 100 mg, 100 mg, Oral, Daily, Ghimire, Shanker M, MD, 100 mg at 03/06/22 1607   nystatin ointment (MYCOSTATIN), , Topical, BID, Tu, Ching T, DO, Given at 03/06/22 0957   ondansetron (ZOFRAN) injection 4 mg, 4 mg, Intravenous, Q6H PRN, Ghimire, Werner Lean, MD  Allergies: Allergies  Allergen Reactions   Rosuvastatin Calcium Other (See Comments)    myalgias    Statins Other (See Comments)    myalgia   Amoxicillin Rash   Atorvastatin Other (See Comments)    Back pain    Ezetimibe-Simvastatin Other (See Comments)    Back pain    Penicillins Itching, Swelling and Rash    Did it involve swelling of the face/tongue/throat, SOB, or low BP? No Did it involve sudden or severe rash/hives, skin peeling, or any reaction on the inside of your mouth or nose? Yes Did you need to seek medical attention at a hospital or doctor's office? No When did it last happen?   young adult    If all above answers are "NO", may proceed with cephalosporin use.       Objective  Vital signs:  Temp:  [97.5 F (36.4 C)-99.4 F (37.4 C)] 97.6 F (36.4 C) (06/29 1606) Pulse Rate:  [93-124] 93 (06/29 1606) Resp:  [16-24] 20 (06/29 1606) BP: (149-197)/(62-127) 188/96 (06/29 1606) SpO2:  [93 %-100 %] 93 % (06/29 1606) Weight:  [102.1 kg] 102.1 kg (06/28 2119)  Psychiatric Specialty Exam:  Presentation  General Appearance: Casual Eye  Contact:Good Speech:Clear and Coherent Speech Volume:Normal Handedness:No data recorded  Mood and Affect  Mood:Euthymic Affect:Congruent  Thought Process  Thought Processes:Linear Descriptions of Associations:Intact  Orientation:Full (Time, Place and Person)  Thought Content:Logical  History of Schizophrenia/Schizoaffective disorder:No data recorded Duration of Psychotic Symptoms:No data recorded Hallucinations:Hallucinations: None  Ideas of Reference:None  Suicidal Thoughts:Suicidal Thoughts: No  Homicidal Thoughts:Homicidal Thoughts: No   Sensorium  Memory:Immediate Fair; Recent Fair; Remote Fair Judgment:Fair Insight:Fair  Executive Functions  Concentration:Good Attention Span:Good Recall:Fair Fund of Knowledge:Good Language:Good  Psychomotor Activity  Psychomotor Activity:Psychomotor Activity: Normal  Assets  Assets:Social Support; Health and safety inspector  Sleep  Sleep:Sleep: Fair  Physical Exam: Physical Exam Constitutional:      Appearance: the patient is not toxic-appearing.  Pulmonary:     Effort: Pulmonary effort is normal.  Neurological:     General: No focal deficit present.     Mental Status: the patient is alert and oriented to person, place, and time.   Review of Systems  Respiratory:  Negative for shortness of breath.   Cardiovascular:  Negative for chest pain.  Gastrointestinal:  Negative for abdominal pain, constipation, diarrhea, nausea and vomiting.  Neurological:  Negative for headaches.   Blood pressure (!) 188/96, pulse 93, temperature 97.6 F (36.4 C), temperature source Oral, resp. rate 20, height 5\' 5"  (1.651 m), weight 102.1 kg, SpO2 93 %. Body mass index is 37.44 kg/m.   , MD PGY-1

## 2022-03-06 NOTE — Progress Notes (Addendum)
PROGRESS NOTE        PATIENT DETAILS Name: Amy Wright Age: 81 y.o. Sex: female Date of Birth: 1940/12/19 Admit Date: 03/05/2022 Admitting Physician Amy Desanctis, DO YE:[id number], Amy Musty, MD  Brief Summary: Patient is a 81 y.o.  female with history of persistent atrial fibrillation-noncompliant to Coumadin following a nosebleed, HTN, HLD, depression, DM-2 with chronic peripheral neuropathy-who presented to the hospital with worsening lower extremity swelling, erythema and intermittent visual hallucinations.  Patient was subsequently admitted to the hospitalist service.   Significant events: 6/28>> admit to [facility]-presented with worsening lower extremity swelling/erythema and intermittent visual hallucinations.  Significant studies: 6/27>> UDS: Opiate positive 6/27>> TSH: 4.5 6/27 >>Vitamin B12: 265 6/28>> CT head: No acute intracranial abnormalities. 6/28>> x-ray left hip: No fractures.  Significant microbiology data:   Procedures:   Consults: Psychiatry.  Subjective: Asking to be discharged-she is awake/alert and answering all my questions appropriately.    Spoke with patient's daughter-over the phone-patient lives with spouse-poor hygienic conditions-unclear what medication she is taking-apparently has been hallucinating for the past 2-3 weeks-at the same time-has had worsening swelling/pain in her lower extremities and has been taking narcotics and benzodiazepines (she is not sure which physician is prescribing these medications).  Objective: Vitals: Blood pressure (!) 197/106, pulse (!) 121, temperature 98.7 F (37.1 C), temperature source Oral, resp. rate 20, height 5\' 5"  (1.651 m), weight 102.1 kg, SpO2 96 %.   Exam: Gen Exam:Alert awake-not in any distress HEENT:atraumatic, normocephalic Chest: B/L clear to auscultation anteriorly CVS:S1S2 regular Abdomen:soft non tender, non distended Extremities: Lymphedema-with bilateral  erythema. Neurology: Non focal Skin: no rash  Pertinent Labs/Radiology:    Latest Ref Rng & Units 03/06/2022    4:27 AM 03/05/2022    9:53 PM 02/02/2022    7:00 AM  CBC  WBC 4.0 - 10.5 K/uL 9.3  9.5  8.0   Hemoglobin 12.0 - 15.0 g/dL 10.7  10.4  11.9   Hematocrit 36.0 - 46.0 % 33.5  34.0  38.8   Platelets 150 - 400 K/uL 339  342  353     Lab Results  Component Value Date   NA 137 03/06/2022   K 3.4 (L) 03/06/2022   CL 106 03/06/2022   CO2 20 (L) 03/06/2022      Assessment/Plan: Acute toxic encephalopathy: Mostly having visual hallucinations-intermittent confusion-I suspect this is probably from polypharmacy-unclear whether patient has some cognitive dysfunction at baseline.  Per history obtained from patient's daughter-patient has a known history of depression/anxiety-and is on medications for that but due to worsening pain in her legs-she has been taking as needed Ativan/narcotics-I suspect this probably is the cause of hallucinations/confusion.  She seems to be relatively awake and alert this morning-denies any hallucinations-minimize polypharmacy-family has numerous concerns-including psych medications-poor living conditions etc.-I will go ahead and consult psychiatry and transition of care team.  Bilateral lower extremity lymphedema with superimposed cellulitis: Continue IV Rocephin-it appears this is a nonpurulent cellulitis.  Checking lower extremity Dopplers to rule out DVT.  Continue IV Lasix.  Persistent atrial fibrillation: Rate controlled-resume beta-blocker-Per patient-she stopped Coumadin several months back as she developed epistaxis.  She does not desire to be placed back on any anticoagulation at this point.  HTN: BP elevated this morning-resume beta-blocker and ARB.  Follow and optimize.  Hypokalemia: Replete and recheck.  Microcytic anemia: Unclear etiology-check anemia panel with  a.m. labs.  Not clear-when-or if she has had a GI work-up in the past.  Fully no  history of any overt GI bleeding.  Borderline vitamin B12 deficiency: Begin supplementation.  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  DM-2 (A1c 8.8 on 6/29): CBGs relatively stable-continue SSI.  Recent Labs    03/06/22 0642  GLUCAP 200*     Possible diabetic peripheral neuropathy: Complains of pain in her lower extremities-has resolved she apparently is self-medicating with narcotics/benzos at home-starting Neurontin.  Follow.  Depression/anxiety: Apparently on Zoloft/benzos as needed at home-unclear how compliant she is with these medications.  Given hallucinations-concern for polypharmacy-we will hold-have consulted psychiatry-we will await further recommendations.  Medication noncompliance: Daughter reports medication noncompliance-she has stopped following with cardiology-she does not wish to leave the house for 1 reason or the other.  Tried to counsel regarding compliance but patient is resistant to change her ways.  Social issues: Lives with spouse-1 daughter in town-2 other daughters are out of state-apparently very poor social conditions-daughter concerned about polypharmacy etc.  I will have transition of care/social work evaluate-we will await recommendations.  Morbid Obesity: Estimated body mass index is 37.44 kg/m as calculated from the following:   Height as of this encounter: 5\' 5"  (1.651 m).   Weight as of this encounter: 102.1 kg.   Code status:   Code Status: Full Code   DVT Prophylaxis: enoxaparin (LOVENOX) injection 40 mg Start: 03/06/22 1230    Family Communication: Daughter-Amy Wright-[phone] over the phone on 6/29   Disposition Plan: Status is: Observation The patient will require care spanning > 2 midnights and should be moved to inpatient because: Cellulitis/polypharmacy with encephalopathy-suspect needs another 1-2 days of hospitalization if patient willing to  stay.  Awaiting PT evaluation/transition of care evaluation.   Planned Discharge Destination:Home health   Diet: Diet Order             Diet Heart Room service appropriate? Yes; Fluid consistency: Thin  Diet effective now                     Antimicrobial agents: Anti-infectives (From admission, onward)    Start     Dose/Rate Route Frequency Ordered Stop   03/06/22 0800  cefTRIAXone (ROCEPHIN) 2 g in sodium chloride 0.9 % 100 mL IVPB        2 g 200 mL/hr over 30 Minutes Intravenous Every 24 hours 03/06/22 0744     03/05/22 2145  cefTRIAXone (ROCEPHIN) 1 g in sodium chloride 0.9 % 100 mL IVPB        1 g 200 mL/hr over 30 Minutes Intravenous  Once 03/05/22 2142 03/05/22 2337        MEDICATIONS: Scheduled Meds:  cyanocobalamin  1,000 mcg Subcutaneous Daily   enoxaparin (LOVENOX) injection  40 mg Subcutaneous [id number]   folic acid  1 mg Oral q morning   [START ON 03/07/2022] furosemide  40 mg Intravenous Daily   insulin aspart  0-15 Units Subcutaneous TID WC   irbesartan  150 mg Oral Daily   metoprolol succinate  100 mg Oral Daily   nystatin ointment   Topical BID   Continuous Infusions:  cefTRIAXone (ROCEPHIN)  IV 2 g (03/06/22 1002)   PRN Meds:.acetaminophen, hydrALAZINE, ondansetron (ZOFRAN) IV   I have personally reviewed following labs and imaging studies  LABORATORY DATA: CBC: Recent Labs  Lab 03/05/22 2153 03/06/22 0427  WBC 9.5 9.3  NEUTROABS  6.8  --   HGB 10.4* 10.7*  HCT 34.0* 33.5*  MCV 78.7* 77.5*  PLT 342 339    Basic Metabolic Panel: Recent Labs  Lab 03/05/22 2153 03/06/22 0427  NA 137 137  K 3.9 3.4*  CL 104 106  CO2 23 20*  GLUCOSE 183* 193*  BUN 16 13  CREATININE 0.77 0.77  CALCIUM 9.2 8.9    GFR: Estimated Creatinine Clearance: 67.5 mL/min (by C-G formula based on SCr of 0.77 mg/dL).  Liver Function Tests: Recent Labs  Lab 03/05/22 2153  AST 26  ALT 18  ALKPHOS 85  BILITOT 1.2  PROT 7.0  ALBUMIN 3.1*   No results  for input(s): "LIPASE", "AMYLASE" in the last 168 hours. No results for input(s): "AMMONIA" in the last 168 hours.  Coagulation Profile: Recent Labs  Lab 03/05/22 2153  INR 1.2    Cardiac Enzymes: No results for input(s): "CKTOTAL", "CKMB", "CKMBINDEX", "TROPONINI" in the last 168 hours.  BNP (last 3 results) No results for input(s): "PROBNP" in the last 8760 hours.  Lipid Profile: No results for input(s): "CHOL", "HDL", "LDLCALC", "TRIG", "CHOLHDL", "LDLDIRECT" in the last 72 hours.  Thyroid Function Tests: Recent Labs    03/05/22 2153  TSH 4.577*    Anemia Panel: Recent Labs    03/05/22 0830  VITAMINB12 265    Urine analysis:    Component Value Date/Time   COLORURINE COLORLESS (A) 03/05/2022 2341   APPEARANCEUR CLEAR 03/05/2022 2341   LABSPEC 1.005 03/05/2022 2341   PHURINE 7.0 03/05/2022 2341   GLUCOSEU NEGATIVE 03/05/2022 2341   HGBUR NEGATIVE 03/05/2022 2341   BILIRUBINUR NEGATIVE 03/05/2022 2341   KETONESUR NEGATIVE 03/05/2022 2341   PROTEINUR 30 (A) 03/05/2022 2341   NITRITE NEGATIVE 03/05/2022 2341   LEUKOCYTESUR NEGATIVE 03/05/2022 2341    Sepsis Labs: Lactic Acid, Venous No results found for: "LATICACIDVEN"  MICROBIOLOGY: No results found for this or any previous visit (from the past 240 hour(s)).  RADIOLOGY STUDIES/RESULTS: DG HIP UNILAT WITH PELVIS 2-3 VIEWS LEFT  Result Date: 03/06/2022 CLINICAL DATA:  Left hip/upper thigh hematoma with soreness and swelling in the leg. EXAM: DG HIP (WITH OR WITHOUT PELVIS) 2-3V LEFT COMPARISON:  None Available. FINDINGS: No acute fracture or hip dislocation is identified. 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 No radiopaque foreign body or other focal soft tissue abnormality is identified. IMPRESSION: No acute osseous abnormality identified. Electronically Signed   By: Sebastian Ache M.D.   On: 03/06/2022 08:31   CT HEAD WO CONTRAST ( )  Result Date: 03/06/2022 CLINICAL DATA:  Mental status  change of unknown cause. Leg swelling. EXAM: CT HEAD WITHOUT CONTRAST TECHNIQUE: Contiguous axial images were obtained from the base of the skull through the vertex without intravenous contrast. RADIATION DOSE REDUCTION: This exam was performed according to the departmental dose-optimization program which includes automated exposure control, adjustment of the mA and/or kV according to patient size and/or use of iterative reconstruction technique. COMPARISON:  01/29/2021 FINDINGS: Brain: Diffuse cerebral atrophy. Ventricular dilatation consistent with central atrophy. Low-attenuation changes in the deep white matter consistent with small vessel ischemia. No abnormal extra-axial fluid collections. No mass effect or midline shift. Gray-white matter junctions are distinct. Basal cisterns are not effaced. No acute intracranial hemorrhage. Vascular: No hyperdense vessel or unexpected calcification. Skull: Normal. Negative for fracture or focal lesion. Sinuses/Orbits: No acute finding. Other: None. IMPRESSION: No acute intracranial abnormalities. Chronic atrophy and small vessel ischemic changes. Electronically Signed   By: Marisa Cyphers.D.  On: 03/06/2022 03:52     LOS: 0 days   Jeoffrey Massed, MD  [facility]    To contact the attending provider between 7A-7P or the covering provider during after hours 7P-7A, please log into the web site [email or web address] and access using universal [place] password for that web site. If you do not have the password, please call the hospital operator.  03/06/2022, 11:39 AM

## 2022-03-06 NOTE — Assessment & Plan Note (Signed)
-  on abdominal pannus -nystatin ointment BID

## 2022-03-06 NOTE — Assessment & Plan Note (Signed)
-   Unclear baseline but currently she is alert and oriented to self and time but not place.  Also having tangential thoughts although redirectable.  Per family, she has been having hallucinations for the past 3 weeks -Unclear if she has been taking her medications including Coumadin.  Her INR is subtherapeutic today but bruising noted on exam and her history of increased falls is concerning. Will obtain stat CT head. Also obtain left hip/pelvis X-ray for bruising seen on left inner thigh.

## 2022-03-06 NOTE — Assessment & Plan Note (Signed)
-  check HbA1C -Start on moderate SSI

## 2022-03-07 ENCOUNTER — Other Ambulatory Visit (HOSPITAL_COMMUNITY): Payer: Self-pay

## 2022-03-07 DIAGNOSIS — E119 Type 2 diabetes mellitus without complications: Secondary | ICD-10-CM | POA: Diagnosis not present

## 2022-03-07 DIAGNOSIS — B372 Candidiasis of skin and nail: Secondary | ICD-10-CM | POA: Diagnosis not present

## 2022-03-07 DIAGNOSIS — R4182 Altered mental status, unspecified: Secondary | ICD-10-CM | POA: Diagnosis not present

## 2022-03-07 DIAGNOSIS — R600 Localized edema: Secondary | ICD-10-CM | POA: Diagnosis not present

## 2022-03-07 LAB — BASIC METABOLIC PANEL
Anion gap: 16 — ABNORMAL HIGH (ref 5–15)
BUN: 18 mg/dL (ref 8–23)
CO2: 18 mmol/L — ABNORMAL LOW (ref 22–32)
Calcium: 8.9 mg/dL (ref 8.9–10.3)
Chloride: 104 mmol/L (ref 98–111)
Creatinine, Ser: 0.78 mg/dL (ref 0.44–1.00)
GFR, Estimated: >60 mL/min (ref >60)
Glucose, Bld: 230 mg/dL — ABNORMAL HIGH (ref 70–99)
Potassium: 4.2 mmol/L (ref 3.5–5.1)
Sodium: 138 mmol/L (ref 135–145)

## 2022-03-07 LAB — RETICULOCYTES
Immature Retic Fract: 38.5 % — ABNORMAL HIGH (ref 2.3–15.9)
RBC.: 4.31 MIL/uL (ref 3.87–5.11)
Retic Count, Absolute: 105.2 10*3/uL (ref 19.0–186.0)
Retic Ct Pct: 2.4 % (ref 0.4–3.1)

## 2022-03-07 LAB — IRON AND TIBC
Iron: 46 ug/dL (ref 28–170)
Saturation Ratios: 12 % (ref 10.4–31.8)
TIBC: 375 ug/dL (ref 250–450)
UIBC: 329 ug/dL

## 2022-03-07 LAB — FERRITIN: Ferritin: 36 ng/mL (ref 11–307)

## 2022-03-07 LAB — FOLATE: Folate: 14.8 ng/mL (ref >5.9)

## 2022-03-07 LAB — MAGNESIUM: Magnesium: 1.6 mg/dL — ABNORMAL LOW (ref 1.7–2.4)

## 2022-03-07 LAB — GLUCOSE, CAPILLARY
Glucose-Capillary: 248 mg/dL — ABNORMAL HIGH (ref 70–99)
Glucose-Capillary: 243 mg/dL — ABNORMAL HIGH (ref 70–99)

## 2022-03-07 LAB — AMMONIA: Ammonia: 27 umol/L (ref 9–35)

## 2022-03-07 MED ORDER — CEPHALEXIN 500 MG PO CAPS
500.0000 mg | ORAL_CAPSULE | Freq: Three times a day (TID) | ORAL | 0 refills | Status: AC
  Filled 2022-03-07: qty 15, 5d supply, fill #0

## 2022-03-07 MED ORDER — GABAPENTIN 300 MG PO CAPS
300.0000 mg | ORAL_CAPSULE | Freq: Three times a day (TID) | ORAL | 2 refills | Status: DC
  Filled 2022-03-07: qty 90, 30d supply, fill #0

## 2022-03-07 MED ORDER — APIXABAN 5 MG PO TABS
5.0000 mg | ORAL_TABLET | Freq: Two times a day (BID) | ORAL | 3 refills | Status: DC
  Filled 2022-03-07: qty 60, 30d supply, fill #0

## 2022-03-07 MED ORDER — FERROUS SULFATE 325 (65 FE) MG PO TABS
325.0000 mg | ORAL_TABLET | Freq: Two times a day (BID) | ORAL | 3 refills | Status: AC
  Filled 2022-03-07: qty 60, 30d supply, fill #0

## 2022-03-07 MED ORDER — GABAPENTIN 50 MG PO TABS
300.0000 mg | ORAL_TABLET | Freq: Three times a day (TID) | ORAL | 3 refills | Status: DC
  Filled 2022-03-07: qty 90, fill #0

## 2022-03-07 MED ORDER — BUPROPION HCL ER (XL) 150 MG PO TB24: 150.0000 mg | ORAL_TABLET | Freq: Every morning | ORAL | Status: DC

## 2022-03-07 MED ORDER — MAGNESIUM SULFATE 4 GM/100ML IV SOLN
4.0000 g | Freq: Once | INTRAVENOUS | Status: AC
  Administered 2022-03-07: 4 g via INTRAVENOUS
  Filled 2022-03-07: qty 100

## 2022-03-07 MED ORDER — SERTRALINE HCL 100 MG PO TABS
100.0000 mg | ORAL_TABLET | Freq: Every morning | ORAL | Status: DC
  Administered 2022-03-07: 100 mg via ORAL
  Filled 2022-03-07: qty 1

## 2022-03-07 MED ORDER — DILTIAZEM HCL ER COATED BEADS 240 MG PO CP24
240.0000 mg | ORAL_CAPSULE | Freq: Every day | ORAL | Status: DC
  Administered 2022-03-07: 240 mg via ORAL
  Filled 2022-03-07: qty 1

## 2022-03-07 MED ORDER — INSULIN GLARGINE-YFGN 100 UNIT/ML ~~LOC~~ SOLN
20.0000 [IU] | Freq: Every day | SUBCUTANEOUS | Status: DC
  Administered 2022-03-07: 20 [IU] via SUBCUTANEOUS
  Filled 2022-03-07: qty 0.2

## 2022-03-07 MED ORDER — VITAMIN B-12 1000 MCG PO TABS
1000.0000 ug | ORAL_TABLET | Freq: Every morning | ORAL | 3 refills | Status: AC
  Filled 2022-03-07: qty 30, 30d supply, fill #0

## 2022-03-07 MED ORDER — NYSTATIN 100000 UNIT/GM EX OINT
TOPICAL_OINTMENT | Freq: Two times a day (BID) | CUTANEOUS | 0 refills | Status: AC
  Filled 2022-03-07: qty 30, 30d supply, fill #0

## 2022-03-07 MED ORDER — FUROSEMIDE 40 MG PO TABS
40.0000 mg | ORAL_TABLET | Freq: Every day | ORAL | 3 refills | Status: DC
  Filled 2022-03-07: qty 30, 30d supply, fill #0

## 2022-03-07 MED ORDER — LANTUS SOLOSTAR 100 UNIT/ML ~~LOC~~ SOPN: 25.0000 [IU] | PEN_INJECTOR | Freq: Every day | SUBCUTANEOUS | 11 refills | Status: DC

## 2022-03-07 NOTE — Plan of Care (Signed)
  Problem: Education: Goal: Knowledge of General Education information will improve Description: Including pain rating scale, medication(s)/side effects and non-pharmacologic comfort measures Outcome: Adequate for Discharge   Problem: Health Behavior/Discharge Planning: Goal: Ability to manage health-related needs will improve Outcome: Adequate for Discharge   Problem: Clinical Measurements: Goal: Ability to maintain clinical measurements within normal limits will improve Outcome: Adequate for Discharge Goal: Will remain free from infection Outcome: Adequate for Discharge Goal: Diagnostic test results will improve Outcome: Adequate for Discharge Goal: Respiratory complications will improve Outcome: Adequate for Discharge Goal: Cardiovascular complication will be avoided Outcome: Adequate for Discharge   Problem: Clinical Measurements: Goal: Ability to maintain clinical measurements within normal limits will improve Outcome: Adequate for Discharge Goal: Will remain free from infection Outcome: Adequate for Discharge Goal: Diagnostic test results will improve Outcome: Adequate for Discharge Goal: Respiratory complications will improve Outcome: Adequate for Discharge Goal: Cardiovascular complication will be avoided Outcome: Adequate for Discharge   Problem: Activity: Goal: Risk for activity intolerance will decrease Outcome: Adequate for Discharge   Problem: Nutrition: Goal: Adequate nutrition will be maintained Outcome: Adequate for Discharge   Problem: Coping: Goal: Level of anxiety will decrease Outcome: Adequate for Discharge   Problem: Elimination: Goal: Will not experience complications related to bowel motility Outcome: Adequate for Discharge Goal: Will not experience complications related to urinary retention Outcome: Adequate for Discharge   Problem: Pain Managment: Goal: General experience of comfort will improve Outcome: Adequate for Discharge    Problem: Safety: Goal: Ability to remain free from injury will improve Outcome: Adequate for Discharge   Problem: Safety: Goal: Ability to remain free from injury will improve Outcome: Adequate for Discharge   Problem: Skin Integrity: Goal: Risk for impaired skin integrity will decrease Outcome: Adequate for Discharge   Problem: Education: Goal: Ability to describe self-care measures that may prevent or decrease complications (Diabetes Survival Skills Education) will improve Outcome: Adequate for Discharge Goal: Individualized Educational Video(s) Outcome: Adequate for Discharge   Problem: Coping: Goal: Ability to adjust to condition or change in health will improve Outcome: Adequate for Discharge   Problem: Fluid Volume: Goal: Ability to maintain a balanced intake and output will improve Outcome: Adequate for Discharge   Problem: Health Behavior/Discharge Planning: Goal: Ability to identify and utilize available resources and services will improve Outcome: Adequate for Discharge Goal: Ability to manage health-related needs will improve Outcome: Adequate for Discharge   Problem: Metabolic: Goal: Ability to maintain appropriate glucose levels will improve Outcome: Adequate for Discharge   Problem: Nutritional: Goal: Maintenance of adequate nutrition will improve Outcome: Adequate for Discharge Goal: Progress toward achieving an optimal weight will improve Outcome: Adequate for Discharge   Problem: Skin Integrity: Goal: Risk for impaired skin integrity will decrease Outcome: Adequate for Discharge   Problem: Tissue Perfusion: Goal: Adequacy of tissue perfusion will improve Outcome: Adequate for Discharge   Problem: Acute Rehab PT Goals(only PT should resolve) Goal: Pt Will Go Supine/Side To Sit Outcome: Adequate for Discharge Goal: Patient Will Transfer Sit To/From Stand Outcome: Adequate for Discharge Goal: Pt Will Ambulate Outcome: Adequate for  Discharge Goal: Pt Will Go Up/Down Stairs Outcome: Adequate for Discharge   Problem: Acute Rehab PT Goals(only PT should resolve) Goal: Pt Will Go Supine/Side To Sit Outcome: Adequate for Discharge Goal: Patient Will Transfer Sit To/From Stand Outcome: Adequate for Discharge Goal: Pt Will Ambulate Outcome: Adequate for Discharge Goal: Pt Will Go Up/Down Stairs Outcome: Adequate for Discharge

## 2022-03-07 NOTE — Discharge Summary (Signed)
PATIENT DETAILS Name: Amy Wright Age: 81 y.o. Sex: female Date of Birth: 1941/05/25 MRN: [id number]. Admitting Physician: Maretta Bees, MD QIW:[id number], Genia Del, MD  Admit Date: 03/05/2022 Discharge date: 03/07/2022  Recommendations for Outpatient Follow-up:  Follow up with PCP in 1-2 weeks Please obtain CMP/CBC in one week Please ensure follow-up with cardiology, endocrinology Please consider outpatient referral to gastroenterology-for iron deficiency anemia. Minimize polypharmacy as much as possible. Started on B12 supplementation-please recheck B12 levels in 3 to 6 months. Started on Neurontin for neuropathic pain-please titrate TSH minimally elevated-repeat TSH in 3 to 6 months.  Admitted From:  Home  Disposition: Home health   Discharge Condition: good  CODE STATUS:   Code Status: Full Code   Diet recommendation:  Diet Order             Diet - low sodium heart healthy           Diet Carb Modified           Diet Heart Room service appropriate? Yes; Fluid consistency: Thin  Diet effective now                    Brief Summary: Patient is a 81 y.o.  female with history of persistent atrial fibrillation-noncompliant to Coumadin following a nosebleed, HTN, HLD, depression, DM-2 with chronic peripheral neuropathy-who presented to the hospital with worsening lower extremity swelling, erythema and intermittent visual hallucinations.  Patient was subsequently admitted to the hospitalist service.     Significant events: 6/28>> admit to [facility]-presented with worsening lower extremity swelling/erythema and intermittent visual hallucinations.   Significant studies: 6/27>> UDS: Opiate positive 6/27>> TSH: 4.5 6/27 >>Vitamin B12: 265 6/28>> CT head: No acute intracranial abnormalities. 6/28>> x-ray left hip: No fractures.   Significant microbiology data:     Procedures:     Consults: Psychiatry.  Brief Hospital Course:  Acute toxic  encephalopathy: Due to polypharmacy-combination of Ambien/narcotics/Ativan.Encephalopathy has resolved-she is completely awake and alert-offending agents have been held while she has been in the hospital.  Extensive counseling has been done to both patient/spouse and daughter over the phone-all aware of risks of polypharmacy-and the importance of minimizing these medications as much as possible.     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  She apparently does not like taking Lasix at home-as she complains it makes her weak-have counseled-she seems agreeable to go back on Lasix-we will transition to Keflex on discharge.  Dopplers negative for DVT.  Follow with PCP for further optimization.     Persistent atrial fibrillation: Rate controlled-continue beta-blocker/Cardizem on discharge.  Unfortunately she stopped Coumadin several months back due to epistaxis-she was initially very reluctant to get back on anticoagulation-but subsequently agreed to try out Eliquis-she claims that her sisters are on Eliquis and she would rather take that then Coumadin.  Eliquis was started on 6/29-no adverse effects-no obvious bleeding-continue Eliquis on discharge.  She unfortunately has not followed up with cardiology since her primary cardiologist left the practice-I have texted cardiology navigator to see if we can get her an appointment.     HTN: BP on the high side-resume all of antihypertensives on discharge.  Further optimization deferred to her PCP.    Hypokalemia: Repleted.  Hypomagnesemia: Replete prior to discharge-recheck at PCPs office.   Microcytic anemia: Unclear etiology-anemia panel suggestive of iron deficiency-ferritin on the lower side-no overt GI bleeding apparent-we will defer further work-up to the outpatient  setting.  Both patient and her daughter are aware of recommendations.  Per patient's  daughter-patient apparently has not had a screening colonoscopy done.  Hopefully she will agree for a outpatient GI referral-however her daughter Horris Latino is not very optimistic that she will actually agree to this.  In the meantime-she will continue with iron supplementation.  Borderline vitamin B12 deficiency: Begin supplementation.  Repeat B12 levels in 3 months.   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  DM-2 (A1c 8.8 on 6/29): CBGs on the higher side-resume regular insulin regimen on discharge.  Patient requesting to be placed on newer diabetic medications that will control her diabetes and also will help with weight loss-I have asked her to follow-up with endocrinology-as this MD is not familiar with these newer medications.  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  Minimize use of narcotics or avoid use of narcotics as much as possible.  Depression/anxiety: Seems stable-evaluated by psychiatry-recommendations are to resume her usual antidepressants.  Resume Zoloft-minimize use of benzos as much as possible-she has been counseled extensively.  Per patient she no longer is on Wellbutrin.     Medication noncompliance: Daughter reports medication noncompliance-she has stopped following with cardiology-she does not wish to leave the house for 1 reason or the other.  She has been counseled extensively-she is aware of the life-threatening/life disabling and dangers of polypharmacy.  Social issues: Lives with spouse-1 daughter in town-2 other daughters are out of state-apparently very poor social conditions-daughter concerned about polypharmacy etc. extensive counseling done regarding polypharmacy-evaluated by social worker-we will arrange for home health  services on discharge.   Morbid Obesity: Estimated body mass index is 37.44 kg/m as calculated from the following:   Height as of this encounter: 5\' 5"  (1.651 m).   Weight as of this encounter: 102.1 kg.   Discharge Diagnoses:  Principal Problem:   AMS (altered mental status) Active Problems:   Persistent atrial fibrillation (HCC)   DM2 (diabetes mellitus, type 2) (HCC)   Essential hypertension   Lower extremity edema   BMI 37.0-37.9, adult   Candidiasis of skin   Encephalopathy acute   Discharge Instructions:  Activity:  As tolerated with Full fall precautions use walker/cane & assistance as needed   Discharge Instructions     Ambulatory referral to Endocrinology   Complete by: As directed    Call MD for:  extreme fatigue   Complete by: As directed    Call MD for:  persistant dizziness or light-headedness   Complete by: As directed    Diet - low sodium heart healthy   Complete by: As directed    Diet Carb Modified   Complete by: As directed    Discharge instructions   Complete by: As directed    Follow with Primary MD  Robyne Peers, MD in 1-2 weeks  Please follow-up with your cardiologist in the next 1-2 weeks  Please take your medications as prescribed  You have developed iron deficiency anemia-please ask your primary care practitioner for gastroenterology referral.  You have been started on iron supplementation-please ask your primary care practitioner to repeat iron levels in 3 to 6 months.  You also have vitamin B12 deficiency-please continue taking vitamin B12 supplementation-please ask your primary care practitioner to repeat vitamin B12 levels in 3 to 6 months.  A electronic referral has  been sent to endocrinology-their office will give you a call.  In the meantime-please continue your usual diabetic regimen-check your sugars multiple times a day-keep a record of these readings and take it to your appointment.  Please avoid use of  narcotics/Ativan/Ambien-they have potential to cause confusion/hallucinations-and polypharmacy can cause life-threatening and life disabling risks.  Please talk to your primary care practitioner to minimize use of these medications as much as possible.    Please get a complete blood count and chemistry panel checked by your Primary MD at your next visit, and again as instructed by your Primary MD.  Get Medicines reviewed and adjusted: Please take all your medications with you for your next visit with your Primary MD  Laboratory/radiological data: Please request your Primary MD to go over all hospital tests and procedure/radiological results at the follow up, please ask your Primary MD to get all Hospital records sent to his/her office.  In some cases, they will be blood work, cultures and biopsy results pending at the time of your discharge. Please request that your primary care M.D. follows up on these results.  Also Note the following: If you experience worsening of your admission symptoms, develop shortness of breath, life threatening emergency, suicidal or homicidal thoughts you must seek medical attention immediately by calling 911 or calling your MD immediately  if symptoms less severe.  You must read complete instructions/literature along with all the possible adverse reactions/side effects for all the Medicines you take and that have been prescribed to you. Take any new Medicines after you have completely understood and accpet all the possible adverse reactions/side effects.   Do not drive when taking Pain medications or sleeping medications (Benzodaizepines)  Do not take more than prescribed Pain, Sleep and Anxiety Medications. It is not advisable to combine anxiety,sleep and pain medications without talking with your primary care practitioner  Special Instructions: If you have smoked or chewed Tobacco  in the last 2 yrs please stop smoking, stop any regular Alcohol  and or any  Recreational drug use.  Wear Seat belts while driving.  Please note: You were cared for by a hospitalist during your hospital stay. Once you are discharged, your primary care physician will handle any further medical issues. Please note that NO REFILLS for any discharge medications will be authorized once you are discharged, as it is imperative that you return to your primary care physician (or establish a relationship with a primary care physician if you do not have one) for your post hospital discharge needs so that they can reassess your need for medications and monitor your lab values.   Increase activity slowly   Complete by: As directed       Allergies as of 03/07/2022       Reactions   Rosuvastatin Calcium Other (See Comments)   myalgias   Statins Other (See Comments)   myalgia   Amoxicillin Rash   Atorvastatin Other (See Comments)   Back pain   Ezetimibe-simvastatin Other (See Comments)   Back pain   Penicillins Itching, Swelling, Rash   Did it involve swelling of the face/tongue/throat, SOB, or low BP? No Did it involve sudden or severe rash/hives, skin peeling, or any reaction on the inside of your mouth or nose? Yes Did you need to seek medical attention at a hospital or doctor's office? No When did it last happen?   young adult    If all above answers are "NO", may proceed with cephalosporin use.  Medication List     STOP taking these medications    buPROPion 150 MG 24 hr tablet Commonly known as: WELLBUTRIN XL   LORazepam 0.5 MG tablet Commonly known as: ATIVAN   oxyCODONE 5 MG immediate release tablet Commonly known as: Roxicodone   warfarin 5 MG tablet Commonly known as: COUMADIN   zolpidem 5 MG tablet Commonly known as: AMBIEN       TAKE these medications    Accu-Chek Compact Plus test strip Generic drug: glucose blood CHECK BLOOD SUGAR TWICE DAILY OR AS DIRECTED   acetaminophen 500 MG tablet Commonly known as: TYLENOL Take 1,000 mg  by mouth every 6 (six) hours as needed for headache or mild pain (pain).   apixaban 5 MG Tabs tablet Commonly known as: ELIQUIS Take 1 tablet (5 mg total) by mouth 2 (two) times daily.   cephALEXin 500 MG capsule Commonly known as: KEFLEX Take 1 capsule (500 mg total) by mouth 3 (three) times daily for 5 days.   cholecalciferol 25 MCG (1000 UNIT) tablet Commonly known as: VITAMIN D3 Take 1,000 Units by mouth daily.   diltiazem 360 MG 24 hr capsule Commonly known as: CARDIZEM CD TAKE 1 CAPSULE BY MOUTH EVERY MORNING. Please make overdue appt with Dr. Rayann Heman for June 2023 for future refills. Thank you 1st attempt   ezetimibe 10 MG tablet Commonly known as: ZETIA Take 10 mg by mouth at bedtime.   ferrous sulfate 325 (65 FE) MG tablet Take 1 tablet (325 mg total) by mouth 2 (two) times daily with a meal.   folic acid 1 MG tablet Commonly known as: FOLVITE Take 1 mg by mouth every morning.   furosemide 40 MG tablet Commonly known as: LASIX Take 1 tablet (40 mg total) by mouth daily. What changed:  when to take this reasons to take this   Gabapentin 50 MG Tabs Take 300 mg by mouth 3 (three) times daily.   glipiZIDE 10 MG tablet Commonly known as: GLUCOTROL Take 10 mg by mouth every morning.   Lantus SoloStar 100 UNIT/ML Solostar Pen Generic drug: insulin glargine Inject 25 Units into the skin daily. Called [facility]- they state that last prescription that was picked up was ordered as Lantus 55 units daily What changed: how much to take   magnesium oxide 400 MG tablet Commonly known as: MAG-OX Take 1 tablet (400 mg total) by mouth daily.   metFORMIN 1000 MG tablet Commonly known as: GLUCOPHAGE Take 1,000 mg by mouth 2 (two) times daily with a meal.   metoprolol succinate 50 MG 24 hr tablet Commonly known as: TOPROL-XL TAKE 2 TABLETS IN THE MORNING. TAKE 1 TABLET AT NIGHT. PLEASE KEEP UPCOMING APPT IN APRIL 2022 BEFORE ANYMORE REFILLS. THANK YOU   Myrbetriq 50 MG Tb24  tablet Generic drug: mirabegron ER Take 50 mg by mouth daily.   nystatin ointment Commonly known as: MYCOSTATIN Apply topically 2 (two) times daily.   potassium chloride 10 MEQ tablet Commonly known as: KLOR-CON TAKE 1 TABLET BY MOUTH EVERY DAY   sertraline 100 MG tablet Commonly known as: ZOLOFT Take 100 mg by mouth every morning.   telmisartan 80 MG tablet Commonly known as: MICARDIS Take 80 mg by mouth daily.   vitamin B-12 1000 MCG tablet Commonly known as: CYANOCOBALAMIN Take 1 tablet (1,000 mcg total) by mouth every morning.   vitamin C 1000 MG tablet Take 2,000 mg by mouth daily.   VITAMIN E PO Take 1 capsule by mouth once a week.  Follow-up Information     Robyne Peers, MD. Schedule an appointment as soon as possible for a visit in 1 week(s).   Specialty: Family Medicine Contact information: [place] [address] [phone]         Emmaline Life, NP Follow up on 03/14/2022.   Specialty: Nurse Practitioner Why: appt at 10:05 am (Cardiology) Contact information: [place] [address] [facility] Follow up.   Specialty: Endocrinology Why: Office will call with date/time, If you dont hear from them,please give them a call Contact information: [address] [place] [phone]               Allergies  Allergen Reactions   Rosuvastatin Calcium Other (See Comments)    myalgias    Statins Other (See Comments)    myalgia   Amoxicillin Rash   Atorvastatin Other (See Comments)    Back pain    Ezetimibe-Simvastatin Other (See Comments)    Back pain    Penicillins Itching, Swelling and Rash    Did it involve swelling of the face/tongue/throat, SOB, or low BP? No Did it involve sudden or severe rash/hives, skin peeling, or any reaction on the inside of your mouth or nose? Yes Did you need to seek  medical attention at a hospital or doctor's office? No When did it last happen?   young adult    If all above answers are "NO", may proceed with cephalosporin use.     Other Procedures/Studies: VAS [place] LOWER EXTREMITY VENOUS (DVT)  Result Date: 03/06/2022  Lower Venous DVT Study Patient Name:  MERLEAN SEKHON  Date of Exam:   03/06/2022 Medical Rec #: [id number]        Accession #:    [id number] Date of Birth: [birth year]        Patient Gender: F Patient Age:   37 years Exam Location:  [facility] Procedure:      VAS [place] LOWER EXTREMITY VENOUS (DVT) Referring Phys: Oren Binet --------------------------------------------------------------------------------  Indications: Swelling.  Performing Technologist: Bobetta Lime BS, RVT  Examination Guidelines: A complete evaluation includes B-mode imaging, spectral Doppler, color Doppler, and power Doppler as needed of all accessible portions of each vessel. Bilateral testing is considered an integral part of a complete examination. Limited examinations for reoccurring indications may be performed as noted. The reflux portion of the exam is performed with the patient in reverse Trendelenburg.  +---------+---------------+---------+-----------+----------+-------------------+ RIGHT    CompressibilityPhasicitySpontaneityPropertiesThrombus Aging      +---------+---------------+---------+-----------+----------+-------------------+ CFV      Full           Yes      Yes                                      +---------+---------------+---------+-----------+----------+-------------------+ SFJ      Full                                                             +---------+---------------+---------+-----------+----------+-------------------+ FV Prox  Full                                                             +---------+---------------+---------+-----------+----------+-------------------+  FV Mid   Full                                                              +---------+---------------+---------+-----------+----------+-------------------+ FV Distal               Yes      Yes                                      +---------+---------------+---------+-----------+----------+-------------------+ PFV      Full                                                             +---------+---------------+---------+-----------+----------+-------------------+ POP      Full           Yes      Yes                                      +---------+---------------+---------+-----------+----------+-------------------+ PTV                              Yes                  Not well visualized +---------+---------------+---------+-----------+----------+-------------------+ PERO                             Yes                  Not well visualized +---------+---------------+---------+-----------+----------+-------------------+   Right Technical Findings: The patient was unable to tolerate compressions at the distal thigh and the calf veins were not well visualized with B-mode imaging. Color imaging used to demonstrate patency of these veins.  +---------+---------------+---------+-----------+----------+--------------+ LEFT     CompressibilityPhasicitySpontaneityPropertiesThrombus Aging +---------+---------------+---------+-----------+----------+--------------+ CFV      Full                                                        +---------+---------------+---------+-----------+----------+--------------+ SFJ      Full                                                        +---------+---------------+---------+-----------+----------+--------------+ FV Prox  Full                                                        +---------+---------------+---------+-----------+----------+--------------+ FV Mid  Yes      Yes                                  +---------+---------------+---------+-----------+----------+--------------+ FV Distal               Yes      Yes                                 +---------+---------------+---------+-----------+----------+--------------+ PFV      Full                                                        +---------+---------------+---------+-----------+----------+--------------+ POP      Full           Yes      Yes                                 +---------+---------------+---------+-----------+----------+--------------+ PTV      Full                                                        +---------+---------------+---------+-----------+----------+--------------+ PERO     Full                                                        +---------+---------------+---------+-----------+----------+--------------+   Left Technical Findings: Patient unable to tolerate compressions at the mid and distal thigh levels. Color imaging used to demonstrate patency at those levels.   Summary: BILATERAL: - No evidence of deep vein thrombosis seen in the lower extremities, bilaterally. -No evidence of popliteal cyst, bilaterally. RIGHT: - Portions of this examination were limited- see technologist comments above.  LEFT: - Portions of this examination were limited- see technologist comments above.  *See table(s) above for measurements and observations. Electronically signed by Monica Martinez MD on 03/06/2022 at 7:19:59 PM.    Final    DG HIP UNILAT WITH PELVIS 2-3 VIEWS LEFT  Result Date: 03/06/2022 CLINICAL DATA:  Left hip/upper thigh hematoma with soreness and swelling in the leg. EXAM: DG HIP (WITH OR WITHOUT PELVIS) 2-3V LEFT COMPARISON:  None Available. FINDINGS: No acute fracture or hip dislocation is identified. 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 No radiopaque foreign body or other focal soft tissue abnormality is identified. IMPRESSION: No acute osseous abnormality identified.  Electronically Signed   By: Logan Bores M.D.   On: 03/06/2022 08:31   CT HEAD WO CONTRAST (5MM)  Result Date: 03/06/2022 CLINICAL DATA:  Mental status change of unknown cause. Leg swelling. EXAM: CT HEAD WITHOUT CONTRAST TECHNIQUE: Contiguous axial images were obtained from the base of the skull through the vertex without intravenous contrast. RADIATION DOSE REDUCTION: This exam was performed according to the departmental dose-optimization program which includes automated exposure control, adjustment of the mA and/or kV  according to patient size and/or use of iterative reconstruction technique. COMPARISON:  01/29/2021 FINDINGS: Brain: Diffuse cerebral atrophy. Ventricular dilatation consistent with central atrophy. Low-attenuation changes in the deep white matter consistent with small vessel ischemia. No abnormal extra-axial fluid collections. No mass effect or midline shift. Gray-white matter junctions are distinct. Basal cisterns are not effaced. No acute intracranial hemorrhage. Vascular: No hyperdense vessel or unexpected calcification. Skull: Normal. Negative for fracture or focal lesion. Sinuses/Orbits: No acute finding. Other: None. IMPRESSION: No acute intracranial abnormalities. Chronic atrophy and small vessel ischemic changes. Electronically Signed   By: Burman Nieves M.D.   On: 03/06/2022 03:52     TODAY-DAY OF DISCHARGE:  Subjective:   Amy Wright today has no headache,no chest abdominal pain,no new weakness tingling or numbness, feels much better wants to go home today.   Objective:   Blood pressure (!) 186/106, pulse (!) 106, temperature 98.2 F (36.8 C), temperature source Oral, resp. rate 14, height 5\' 5"  (1.651 m), weight 102.1 kg, SpO2 98 %.  Intake/Output Summary (Last 24 hours) at 03/07/2022 1015 Last data filed at 03/07/2022 0500 Gross per 24 hour  Intake 560 ml  Output 1150 ml  Net -590 ml   Filed Weights   03/05/22 2119  Weight: 102.1 kg    Exam: Awake  Alert, Oriented *3, No new F.N deficits, Normal affect [place].AT,PERRAL Supple Neck,No JVD, No cervical lymphadenopathy appriciated.  Symmetrical Chest wall movement, Good air movement bilaterally, CTAB RRR,No Gallops,Rubs or new Murmurs, No Parasternal Heave +ve B.Sounds, Abd Soft, Non tender, No organomegaly appriciated, No rebound -guarding or rigidity. No Cyanosis, Clubbing or edema, No new Rash or bruise   PERTINENT RADIOLOGIC STUDIES: VAS 2120 LOWER EXTREMITY VENOUS (DVT)  Result Date: 03/06/2022  Lower Venous DVT Study Patient Name:  KAMILLA HANDS  Date of Exam:   03/06/2022 Medical Rec #: 03/08/2022        Accession #:    [id number] Date of Birth: 01-07-1941        Patient Gender: F Patient Age:   28 years Exam Location:  [facility] Procedure:      VAS [facility] LOWER EXTREMITY VENOUS (DVT) Referring Phys: [place] --------------------------------------------------------------------------------  Indications: Swelling.  Performing Technologist: Jeoffrey Massed BS, RVT  Examination Guidelines: A complete evaluation includes B-mode imaging, spectral Doppler, color Doppler, and power Doppler as needed of all accessible portions of each vessel. Bilateral testing is considered an integral part of a complete examination. Limited examinations for reoccurring indications may be performed as noted. The reflux portion of the exam is performed with the patient in reverse Trendelenburg.  +---------+---------------+---------+-----------+----------+-------------------+ RIGHT    CompressibilityPhasicitySpontaneityPropertiesThrombus Aging      +---------+---------------+---------+-----------+----------+-------------------+ CFV      Full           Yes      Yes                                      +---------+---------------+---------+-----------+----------+-------------------+ SFJ      Full                                                              +---------+---------------+---------+-----------+----------+-------------------+ FV Prox  Full                                                             +---------+---------------+---------+-----------+----------+-------------------+  FV Mid   Full                                                             +---------+---------------+---------+-----------+----------+-------------------+ FV Distal               Yes      Yes                                      +---------+---------------+---------+-----------+----------+-------------------+ PFV      Full                                                             +---------+---------------+---------+-----------+----------+-------------------+ POP      Full           Yes      Yes                                      +---------+---------------+---------+-----------+----------+-------------------+ PTV                              Yes                  Not well visualized +---------+---------------+---------+-----------+----------+-------------------+ PERO                             Yes                  Not well visualized +---------+---------------+---------+-----------+----------+-------------------+   Right Technical Findings: The patient was unable to tolerate compressions at the distal thigh and the calf veins were not well visualized with B-mode imaging. Color imaging used to demonstrate patency of these veins.  +---------+---------------+---------+-----------+----------+--------------+ LEFT     CompressibilityPhasicitySpontaneityPropertiesThrombus Aging +---------+---------------+---------+-----------+----------+--------------+ CFV      Full                                                        +---------+---------------+---------+-----------+----------+--------------+ SFJ      Full                                                         +---------+---------------+---------+-----------+----------+--------------+ FV Prox  Full                                                        +---------+---------------+---------+-----------+----------+--------------+ FV Mid  Yes      Yes                                 +---------+---------------+---------+-----------+----------+--------------+ FV Distal               Yes      Yes                                 +---------+---------------+---------+-----------+----------+--------------+ PFV      Full                                                        +---------+---------------+---------+-----------+----------+--------------+ POP      Full           Yes      Yes                                 +---------+---------------+---------+-----------+----------+--------------+ PTV      Full                                                        +---------+---------------+---------+-----------+----------+--------------+ PERO     Full                                                        +---------+---------------+---------+-----------+----------+--------------+   Left Technical Findings: Patient unable to tolerate compressions at the mid and distal thigh levels. Color imaging used to demonstrate patency at those levels.   Summary: BILATERAL: - No evidence of deep vein thrombosis seen in the lower extremities, bilaterally. -No evidence of popliteal cyst, bilaterally. RIGHT: - Portions of this examination were limited- see technologist comments above.  LEFT: - Portions of this examination were limited- see technologist comments above.  *See table(s) above for measurements and observations. Electronically signed by Sherald Hess MD on 03/06/2022 at 7:19:59 PM.    Final    DG HIP UNILAT WITH PELVIS 2-3 VIEWS LEFT  Result Date: 03/06/2022 CLINICAL DATA:  Left hip/upper thigh hematoma with soreness and swelling in the leg. EXAM: DG HIP (WITH OR WITHOUT  PELVIS) 2-3V LEFT COMPARISON:  None Available. FINDINGS: No acute fracture or hip dislocation is identified. 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 No radiopaque foreign body or other focal soft tissue abnormality is identified. IMPRESSION: No acute osseous abnormality identified. Electronically Signed   By: Sebastian Ache M.D.   On: 03/06/2022 08:31   CT HEAD WO CONTRAST ( )  Result Date: 03/06/2022 CLINICAL DATA:  Mental status change of unknown cause. Leg swelling. EXAM: CT HEAD WITHOUT CONTRAST TECHNIQUE: Contiguous axial images were obtained from the base of the skull through the vertex without intravenous contrast. RADIATION DOSE REDUCTION: This exam was performed according to the departmental dose-optimization program which includes automated exposure control, adjustment of the mA and/or kV according  to patient size and/or use of iterative reconstruction technique. COMPARISON:  01/29/2021 FINDINGS: Brain: Diffuse cerebral atrophy. Ventricular dilatation consistent with central atrophy. Low-attenuation changes in the deep white matter consistent with small vessel ischemia. No abnormal extra-axial fluid collections. No mass effect or midline shift. Gray-white matter junctions are distinct. Basal cisterns are not effaced. No acute intracranial hemorrhage. Vascular: No hyperdense vessel or unexpected calcification. Skull: Normal. Negative for fracture or focal lesion. Sinuses/Orbits: No acute finding. Other: None. IMPRESSION: No acute intracranial abnormalities. Chronic atrophy and small vessel ischemic changes. Electronically Signed   By: Lucienne Capers M.D.   On: 03/06/2022 03:52     PERTINENT LAB RESULTS: CBC: Recent Labs    03/05/22 2153 03/06/22 0427  WBC 9.5 9.3  HGB 10.4* 10.7*  HCT 34.0* 33.5*  PLT 342 339   CMET CMP     Component Value Date/Time   NA 138 03/07/2022 0332   NA 140 12/21/2017 1504   K 4.2 03/07/2022 0332   CL 104 03/07/2022 0332   CO2 18 (L)  03/07/2022 0332   GLUCOSE 230 (H) 03/07/2022 0332   BUN 18 03/07/2022 0332   BUN 16 12/21/2017 1504   CREATININE 0.78 03/07/2022 0332   CREATININE 0.85 07/30/2016 1115   CALCIUM 8.9 03/07/2022 0332   PROT 7.0 03/05/2022 2153   ALBUMIN 3.1 (L) 03/05/2022 2153   AST 26 03/05/2022 2153   ALT 18 03/05/2022 2153   ALKPHOS 85 03/05/2022 2153   BILITOT 1.2 03/05/2022 2153   GFRNONAA >60 03/07/2022 0332   GFRAA >60 07/26/2019 0311    GFR Estimated Creatinine Clearance: 67.5 mL/min (by C-G formula based on SCr of 0.78 mg/dL). No results for input(s): "LIPASE", "AMYLASE" in the last 72 hours. No results for input(s): "CKTOTAL", "CKMB", "CKMBINDEX", "TROPONINI" in the last 72 hours. Invalid input(s): "POCBNP" No results for input(s): "DDIMER" in the last 72 hours. Recent Labs    03/06/22 0427  HGBA1C 8.8*   No results for input(s): "CHOL", "HDL", "LDLCALC", "TRIG", "CHOLHDL", "LDLDIRECT" in the last 72 hours. Recent Labs    03/05/22 2153  TSH 4.577*   Recent Labs    03/05/22 0830 03/07/22 0332  VITAMINB12 265  --   FOLATE  --  14.8  FERRITIN  --  36  TIBC  --  375  IRON  --  46  RETICCTPCT  --  2.4   Coags: Recent Labs    03/05/22 2153  INR 1.2   Microbiology: No results found for this or any previous visit (from the past 240 hour(s)).  FURTHER DISCHARGE INSTRUCTIONS:  Get Medicines reviewed and adjusted: Please take all your medications with you for your next visit with your Primary MD  Laboratory/radiological data: Please request your Primary MD to go over all hospital tests and procedure/radiological results at the follow up, please ask your Primary MD to get all Hospital records sent to his/her office.  In some cases, they will be blood work, cultures and biopsy results pending at the time of your discharge. Please request that your primary care M.D. goes through all the records of your hospital data and follows up on these results.  Also Note the  following: If you experience worsening of your admission symptoms, develop shortness of breath, life threatening emergency, suicidal or homicidal thoughts you must seek medical attention immediately by calling 911 or calling your MD immediately  if symptoms less severe.  You must read complete instructions/literature along with all the possible adverse reactions/side effects for all  the Medicines you take and that have been prescribed to you. Take any new Medicines after you have completely understood and accpet all the possible adverse reactions/side effects.   Do not drive when taking Pain medications or sleeping medications (Benzodaizepines)  Do not take more than prescribed Pain, Sleep and Anxiety Medications. It is not advisable to combine anxiety,sleep and pain medications without talking with your primary care practitioner  Special Instructions: If you have smoked or chewed Tobacco  in the last 2 yrs please stop smoking, stop any regular Alcohol  and or any Recreational drug use.  Wear Seat belts while driving.  Please note: You were cared for by a hospitalist during your hospital stay. Once you are discharged, your primary care physician will handle any further medical issues. Please note that NO REFILLS for any discharge medications will be authorized once you are discharged, as it is imperative that you return to your primary care physician (or establish a relationship with a primary care physician if you do not have one) for your post hospital discharge needs so that they can reassess your need for medications and monitor your lab values.  Total Time spent coordinating discharge including counseling, education and face to face time equals greater than 30 minutes.  Signed: Calynn Ferrero 03/07/2022 10:15 AM

## 2022-03-07 NOTE — Discharge Instructions (Signed)

## 2022-03-07 NOTE — Evaluation (Signed)
Occupational Therapy Evaluation Patient Details Name: Jolaine Fryberger MRN: [id number] DOB: 31-Jul-1941 Today's Date: 03/07/2022   History of Present Illness Laporche Martelle is a 81 y.o. female admitted under observation 03/05/22 for evaluation of bilateral lower extremity edema, rash, and AMS including hallucinations PMH includes PMH HTN, HLD, persistent A-fib, T2DM, non-compliance with medications.   Clinical Impression   PTA patient reports independent with ADLs, mobility using RW; spouse assisting with ADLs as needed due to pain and IADLs (cooking, cleaning) but pt managing her own medications.  She was admitted for above and presents with problem list below, including impaired balance, strength, activity tolerance.  Cognitively, she is oriented and follows simple commands, but demonstrates poor recall, awareness to deficits and problem solving.  She requires min-mod assist for transfers (depending on height surface) using RW, min guard for mobility using RW, and up to min assist for ADLs given increased time.  Noted increased work of breathing after mobility from bed to chair, but recovers with seated rest break.  Educated patient on fall prevention and safety, recommendations to use RW right now (vs cane) and to have assistance with medication management- pt agreeable.  Will follow acutely and recommend HHOT, aide upon dc home.       Recommendations for follow up therapy are one component of a multi-disciplinary discharge planning process, led by the attending physician.  Recommendations may be updated based on patient status, additional functional criteria and insurance authorization.   Follow Up Recommendations  Home health OT (aide)    Assistance Recommended at Discharge Frequent or constant Supervision/Assistance  Patient can return home with the following A little help with walking and/or transfers;A little help with bathing/dressing/bathroom;Assistance with cooking/housework;Direct  supervision/assist for financial management;Direct supervision/assist for medications management;Assist for transportation;Help with stairs or ramp for entrance    Functional Status Assessment  Patient has had a recent decline in their functional status and demonstrates the ability to make significant improvements in function in a reasonable and predictable amount of time.  Equipment Recommendations  BSC/3in1    Recommendations for Other Services       Precautions / Restrictions Precautions Precautions: Fall Restrictions Weight Bearing Restrictions: No      Mobility Bed Mobility Overal bed mobility: Needs Assistance Bed Mobility: Supine to Sit     Supine to sit: Mod assist     General bed mobility comments: for trunk support to ascend, increased effort scooting foward to EOB but no assist required    Transfers Overall transfer level: Needs assistance Equipment used: Rolling walker (2 wheels) Transfers: Sit to/from Stand Sit to Stand: Mod assist, Min assist           General transfer comment: mod assist initally from EOB but min assist from recliner, cueing for hand placement with poor carryover      Balance Overall balance assessment: Needs assistance Sitting-balance support: Feet supported, No upper extremity supported Sitting balance-Leahy Scale: Fair     Standing balance support: Bilateral upper extremity supported, During functional activity, No upper extremity supported Standing balance-Leahy Scale: Fair Standing balance comment: relies on BUE support using RW during mobility but able to engage in ADLs without UE support with min guard                           ADL either performed or assessed with clinical judgement   ADL Overall ADL's : Needs assistance/impaired     Grooming: Set up;Sitting  Upper Body Dressing : Set up;Sitting   Lower Body Dressing: Minimal assistance;Sit to/from stand Lower Body Dressing Details  (indicate cue type and reason): slip on shoes and mesh underwear with increased time due to BLE edema, min assist to stand Toilet Transfer: Minimal assistance;Ambulation;Rolling walker (2 wheels)           Functional mobility during ADLs: Min guard;Rolling walker (2 wheels) General ADL Comments: discussed fall prevention and safety     Vision   Vision Assessment?: No apparent visual deficits     Perception     Praxis      Pertinent Vitals/Pain Pain Assessment Pain Assessment: Faces Faces Pain Scale: Hurts worst Pain Location: bil LEs and generalized Pain Descriptors / Indicators: Grimacing, Guarding, Discomfort Pain Intervention(s): Limited activity within patient's tolerance, Monitored during session, Repositioned     Hand Dominance Right   Extremity/Trunk Assessment Upper Extremity Assessment Upper Extremity Assessment: Generalized weakness   Lower Extremity Assessment Lower Extremity Assessment: Defer to PT evaluation       Communication Communication Communication: No difficulties   Cognition Arousal/Alertness: Awake/alert Behavior During Therapy: WFL for tasks assessed/performed Overall Cognitive Status: No family/caregiver present to determine baseline cognitive functioning Area of Impairment: Safety/judgement, Awareness, Problem solving, Memory                     Memory: Decreased recall of precautions, Decreased short-term memory   Safety/Judgement: Decreased awareness of safety Awareness: Emergent Problem Solving: Slow processing, Requires verbal cues General Comments: pt oriented and following commands, demonstrates poor safety awareness during mobility tasks and requires min cueing for problem sovling during ADLs. Decreased recall of recommendations to use RW or even getting up with PT yesterday     General Comments  increased WOB after short distance mobility to recliner, but recovers after a few minutes    Exercises     Shoulder  Instructions      Home Living Family/patient expects to be discharged to:: Private residence Living Arrangements: Spouse/significant other Available Help at Discharge: Family;Available PRN/intermittently (may hire some help) Type of Home: House Home Access: Stairs to enter [employer] of Steps: 2-3 Entrance Stairs-Rails: None Home Layout: One level     Bathroom Shower/Tub: Producer, television/film/video: Handicapped height     Home Equipment: Cane - single point;Shower seat;Adaptive equipment;Rolling Walker (2 wheels);Grab bars - tub/shower;Grab bars - toilet;Hand held shower head Adaptive Equipment: Reacher;Sock aid        Prior Functioning/Environment Prior Level of Function : Needs assist             Mobility Comments: Used RW around house ADLs Comments: Lately needed help with bahting and dressing.  Too painful to get in recently per pt        OT Problem List: Decreased strength;Decreased activity tolerance;Impaired balance (sitting and/or standing);Decreased cognition;Decreased safety awareness;Decreased knowledge of use of DME or AE;Decreased knowledge of precautions;Obesity;Increased edema;Pain      OT Treatment/Interventions: Self-care/ADL training;Therapeutic exercise;DME and/or AE instruction;Therapeutic activities;Patient/family education;Balance training;Cognitive remediation/compensation    OT Goals(Current goals can be found in the care plan section) Acute Rehab OT Goals Patient Stated Goal: home today OT Goal Formulation: With patient Time For Goal Achievement: 03/21/22 Potential to Achieve Goals: Good  OT Frequency: Min 2X/week    Co-evaluation              AM-PAC OT "6 Clicks" Daily Activity     Outcome Measure Help from another person eating meals?: None Help from  another person taking care of personal grooming?: A Little Help from another person toileting, which includes using toliet, bedpan, or urinal?: A Little Help from  another person bathing (including washing, rinsing, drying)?: A Little Help from another person to put on and taking off regular upper body clothing?: A Little Help from another person to put on and taking off regular lower body clothing?: A Little 6 Click Score: 19   End of Session Equipment Utilized During Treatment: Rolling walker (2 wheels);Gait belt Nurse Communication: Mobility status;Precautions  Activity Tolerance: Patient tolerated treatment well Patient left: in chair;with call bell/phone within reach;with chair alarm set  OT Visit Diagnosis: Other abnormalities of gait and mobility (R26.89);Muscle weakness (generalized) (M62.81)                Time: 0240-9735 OT Time Calculation (min): 28 min Charges:  OT General Charges $OT Visit: 1 Visit OT Evaluation $OT Eval Moderate Complexity: 1 Mod OT Treatments $Self Care/Home Management : 8-22 mins  Barry Brunner, OT Acute Rehabilitation Services Office [phone]   Chancy Milroy 03/07/2022, 12:32 PM

## 2022-03-07 NOTE — Progress Notes (Signed)
Discharge instructions, RX's and follow up explained and provided to patient verbalized understanding. Home health services set up by case mang. Patient left floor via wheelchair accompanied by staff.  Clarabel Marion, Kae Heller, RN

## 2022-03-07 NOTE — TOC Transition Note (Addendum)
Transition of Care [facility]) - CM/SW Discharge Note   Patient Details  Name: Amy Wright MRN: [id number] Date of Birth: 03/21/1941  Transition of [facility]) CM/SW Contact:  Bess Kinds, RN Phone Number: [phone] 03/07/2022, 10:47 AM   Clinical Narrative:     Attempt to reach patient's spouse, Orvilla Fus, on cell phone and house phone. Brief message with call back information provided in voicemail on cell phone.   Spoke with [name] on her cell phone to discuss post acute transition. Provided HH information for [facility] who accepted referral for PT/OT/RN/Aide/SW. HH orders in place. DME at home includes RW, cane, shower stool, and lift chair. [name] anticipates recommendations from therapy for additional DME. Spouse to provide transportation home.   No further TOC needs identified at this time.   UPDATE 1310: Notified by OT of patient's need for BSC/3N1. Spoke with patient's daughter, [name]. Referral to [facility] for delivery to the room prior to discharge.   1420: Patient discharged home prior to receiving BSC/3N1. Spoke with liaison at [facility] - it will be delivered to the home, likely this weekend. Left voicemail for [name] to advise.   Final next level of care: Home w Home Health Services Barriers to Discharge: No Barriers Identified   Patient Goals and CMS Choice Patient states their goals for this hospitalization and ongoing recovery are:: return home with husband CMS Medicare.gov Compare Post Acute Care list provided to:: Patient Choice offered to / list presented to : Patient, Adult Children  Discharge Placement                       Discharge Plan and Services     Post Acute Care Choice: Home Health          DME Arranged: N/A DME Agency: NA       HH Arranged: RN, PT, OT, Nurse's Aide, Social Work [employer] Agency: [facility] Date HH Agency Contacted: 03/07/22 Time HH Agency Contacted: 1046 Representative spoke with at [facility] Agency:  Elnita Maxwell  Social Determinants of Health (SDOH) Interventions     Readmission Risk Interventions    07/27/2019   11:56 AM 07/26/2019    3:25 PM  Readmission Risk Prevention Plan  Post Dischage Appt  Complete  Medication Screening  Complete  Transportation Screening Complete Complete  PCP or Specialist Appt within 5-7 Days Complete   Home Care Screening Complete   Medication Review (RN CM) Complete

## 2022-03-12 ENCOUNTER — Other Ambulatory Visit: Payer: Self-pay

## 2022-03-12 ENCOUNTER — Ambulatory Visit: Payer: Medicare Other | Admitting: Physician Assistant

## 2022-03-12 ENCOUNTER — Ambulatory Visit (HOSPITAL_COMMUNITY)
Admission: RE | Admit: 2022-03-12 | Discharge: 2022-03-12 | Disposition: A | Discharge status: Home / Self Care | Payer: Medicare Other | Source: Ambulatory Visit | Attending: Vascular Surgery | Admitting: Vascular Surgery

## 2022-03-12 VITALS — BP 185/102 | HR 94 | Temp 98.1°F | Resp 20 | Ht 65.0 in | Wt 259.1 lb

## 2022-03-12 DIAGNOSIS — R600 Localized edema: Secondary | ICD-10-CM | POA: Insufficient documentation

## 2022-03-13 ENCOUNTER — Other Ambulatory Visit: Payer: Self-pay

## 2022-03-14 ENCOUNTER — Ambulatory Visit: Payer: Medicare Other | Admitting: Nurse Practitioner

## 2022-03-14 ENCOUNTER — Other Ambulatory Visit: Payer: Self-pay

## 2022-03-14 DIAGNOSIS — M79672 Pain in left foot: Secondary | ICD-10-CM

## 2022-03-14 DIAGNOSIS — R600 Localized edema: Secondary | ICD-10-CM

## 2022-03-20 ENCOUNTER — Other Ambulatory Visit (HOSPITAL_BASED_OUTPATIENT_CLINIC_OR_DEPARTMENT_OTHER): Payer: Self-pay

## 2022-03-20 ENCOUNTER — Telehealth (HOSPITAL_BASED_OUTPATIENT_CLINIC_OR_DEPARTMENT_OTHER): Payer: Self-pay

## 2022-03-20 NOTE — Telephone Encounter (Signed)
Transitions of Care Pharmacy   Call attempted for a pharmacy transitions of care follow-up.  Call attempt #1. Will follow-up in 2-3 days.   Oneida Alar, PharmD Clinical Pharmacy Resident  [facility] 03/20/2022 11:13 AM

## 2022-03-28 ENCOUNTER — Other Ambulatory Visit: Payer: Self-pay | Admitting: Internal Medicine

## 2022-03-31 ENCOUNTER — Other Ambulatory Visit (HOSPITAL_COMMUNITY): Payer: Self-pay

## 2022-04-03 ENCOUNTER — Other Ambulatory Visit (HOSPITAL_COMMUNITY): Payer: Self-pay

## 2022-04-09 ENCOUNTER — Ambulatory Visit: Payer: Medicare Other | Admitting: Nurse Practitioner

## 2022-04-09 NOTE — Progress Notes (Deleted)
Cardiology Office Note:    Date:  04/09/2022   ID:  Jocilyn Trego, DOB 08/25/1941, MRN [id number]  PCP:  Angelica Chessman, MD   [facility] Providers Cardiologist:  None { Click to update primary MD,subspecialty MD or APP then REFRESH:1}    Referring MD: Angelica Chessman, MD   Chief Complaint: ***  History of Present Illness:    Amy Wright is a *** 81 y.o. female with a hx of permanent atrial fibrillation, hypertension, hyperlipidemia, OSA on CPAP, type 2 diabetes, obesity, and LE edema.  Longtime patient of Dr. Johney Frame for management of atrial fibrillation.  Previously on Tikosyn, having paroxysms of A-fib and plan for DCCV.  She did not want to pursue ablation or alternative AAD at that time.  She did not wish to pursue surgical intervention.  She was last seen in our office on 02/27/2021 by Dr. Johney Frame, determined to be permanent a fib, rate controlled, minimally symptomatic, on Coumadin for CHA2DS2-VASc score of 5. Previously did not tolerate Eliquis.  He discussed additional options of Xarelto, Pradaxa, and Watchman at length, however she did not wish to make any changes  Seen by vascular surgery on 03/12/2022 with bilateral lower extremity swelling lymphedema.  No palpable pedal pulses but positive Doppler flow bilaterally, no evidence of DVT.  Venous reflux in the GSV in proximal and mid thigh but not at the S FJ or deep system.  She is not a candidate for laser ablation.  Does not want to wear compression stockings. BP elevated at the office visit.    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 Central jet. Aortic Valve appears to be mildly sclerotic. Trace  aortic regurgitation.EF >55%    Past Surgical History:  Procedure Laterality Date   APPENDECTOMY  1971   CARDIOVERSION N/A 06/23/2013   Procedure: CARDIOVERSION;  Surgeon: Lennette Bihari, MD;  Location: [facility] ENDOSCOPY;  Service: Cardiovascular;  Laterality: N/A;   CATARACT EXTRACTION W/ INTRAOCULAR LENS IMPLANT Bilateral    CESAREAN SECTION  1971   PLACEMENT OF BREAST IMPLANTS Bilateral 1985    Current Medications: No outpatient medications have been marked as taking for the 04/09/22 encounter (Appointment) with Levi Aland, NP.     Allergies:   Rosuvastatin calcium, Statins, Amoxicillin, Atorvastatin, Ezetimibe-simvastatin, and Penicillins   Social History   Socioeconomic History   Marital status: Married    Spouse name: Not on file   Number of children: Not on file   Years of education: Not on file   Highest education level: Not on file  Occupational History   Not on file  Tobacco Use   Smoking status: Never    Passive exposure: Yes   Smokeless tobacco: Never  Vaping Use   Vaping Use: Never used  Substance and Sexual Activity   Alcohol use: Yes    Comment: 08/09/2013 "nothing to drink in >4 yrs; never had a problem w/it"   Drug use: No   Sexual activity: Not Currently  Other Topics Concern   Not on file  Social History Narrative   Not on file   Social Determinants of  Health   Financial Resource Strain: Not on file  Food Insecurity: Not on file  Transportation Needs: Not on file  Physical Activity: Not on file  Stress: Not on file  Social Connections: Not on file     Family History: The patient's ***family history includes Cancer - Lung in her father; Cancer - Other in her maternal grandmother; Cardiomyopathy in her mother; Diabetes in her maternal grandfather; Heart attack in her mother; Stroke in her paternal grandfather.  ROS:   Please see the history of present illness.    *** All other systems reviewed and are negative.  Labs/Other Studies  Reviewed:    The following studies were reviewed today:  Echo 09/10/2016 LVEF 55 to 60%, no RWMA, grade 2 diastolic dysfunction Severely dilated LA, trivial AI, trivial MR   Recent Labs: 03/05/2022: ALT 18; B Natriuretic Peptide 253.4; TSH 4.577 03/06/2022: Hemoglobin 10.7; Platelets 339 03/07/2022: BUN 18; Creatinine, Ser 0.78; Magnesium 1.6; Potassium 4.2; Sodium 138  Recent Lipid Panel    Component Value Date/Time   CHOL 232 (H) 03/23/2013 1105   TRIG 486 (H) 03/23/2013 1105   HDL 42 03/23/2013 1105   LDLCALC NOT CALC 03/23/2013 1105     Risk Assessment/Calculations:   {Does this patient have ATRIAL FIBRILLATION?:[phone]}       Physical Exam:    VS:  There were no vitals taken for this visit.    Wt Readings from Last 3 Encounters:  03/12/22 259 lb 1.6 oz (117.5 kg)  03/05/22 225 lb (102.1 kg)  02/02/22 212 lb (96.2 kg)     GEN: *** Well nourished, well developed in no acute distress HEENT: Normal NECK: No JVD; No carotid bruits CARDIAC: ***RRR, no murmurs, rubs, gallops RESPIRATORY:  Clear to auscultation without rales, wheezing or rhonchi  ABDOMEN: Soft, non-tender, non-distended MUSCULOSKELETAL:  No edema; No deformity. *** pedal pulses, ***bilaterally SKIN: Warm and dry NEUROLOGIC:  Alert and oriented x 3 PSYCHIATRIC:  Normal affect   EKG:  EKG is *** ordered today.  The ekg ordered today demonstrates ***  Diagnoses:    No diagnosis found. Assessment and Plan:     Permanent atrial fibrillation: Hypertension: Bilateral LE edema:   {Are you ordering a CV Procedure (e.g. stress test, cath, DCCV, TEE, etc)?   Press F2        :[id number]}    Medication Adjustments/Labs and Tests Ordered: Current medicines are reviewed at length with the patient today.  Concerns regarding medicines are outlined above.  No orders of the defined types were placed in this encounter.  No orders of the defined types were placed in this encounter.   There are no Patient  Instructions on file for this visit.   Signed, Levi Aland, NP  04/09/2022 6:00 AM    [facility]

## 2022-04-10 NOTE — Progress Notes (Signed)
Office Note    CC: Bilateral lower extremity edema Requesting Provider:  Robyne Peers, MD  HPI: Amy Wright is a 81 y.o. (1941/05/25) female presenting at the request of .Robyne Peers, MD for bilateral lower extremity edema.  She was last seen in our office 1 month ago and diagnosed with stage III lymphedema.  No venous reflux was appreciated in the right lower extremity, on exam however there was concern for some peripheral arterial disease as the patient had a nonpalpable pulse.  On exam today, she presented accompanied by her husband.  Her bilateral lower extremity edema has significantly impacted her ambulation.  She continues to try to mobilize as much as possible, but cannot do the things that she would like.  As noted at her last visit she has had bilateral lower extremity swelling for a number of years, however noted significant worsening in the last 6 to 8 months.  She originally appreciated a decrease in swelling in the morning hours as compared to the evening, however now the swelling does not dissipate overnight.  She denies wounds, claudication, ischemic rest pain.  Past Medical History:  Diagnosis Date   Anxiety    Chronic insomnia    Hyperlipidemia    Hypertension    Hypertriglyceridemia    Obesity    OSA on CPAP    Persistent atrial fibrillation (HCC) 11/07/14   Chads2vasc score of at least 4   Type II diabetes mellitus ([place])    Valvular sclerosis 04/30/11   ECHO-Mitral Valve- mild to moderate mitral regurgitation. Central jet. Aortic Valve appears to be mildly sclerotic. Trace aortic regurgitation.EF >55%    Past Surgical History:  Procedure Laterality Date   APPENDECTOMY  1971   CARDIOVERSION N/A 06/23/2013   Procedure: CARDIOVERSION;  Surgeon: Troy Sine, MD;  Location: [facility] ENDOSCOPY;  Service: Cardiovascular;  Laterality: N/A;   CATARACT EXTRACTION W/ INTRAOCULAR LENS IMPLANT Bilateral    CESAREAN SECTION  1971   PLACEMENT OF BREAST IMPLANTS  Bilateral 1985    Social History   Socioeconomic History   Marital status: Married    Spouse name: Not on file   Number of children: Not on file   Years of education: Not on file   Highest education level: Not on file  Occupational History   Not on file  Tobacco Use   Smoking status: Never    Passive exposure: Yes   Smokeless tobacco: Never  Vaping Use   Vaping Use: Never used  Substance and Sexual Activity   Alcohol use: Yes    Comment: 08/09/2013 "nothing to drink in >4 yrs; never had a problem w/it"   Drug use: No   Sexual activity: Not Currently  Other Topics Concern   Not on file  Social History Narrative   Not on file   Social Determinants of Health   Financial Resource Strain: Not on file  Food Insecurity: Not on file  Transportation Needs: Not on file  Physical Activity: Not on file  Stress: Not on file  Social Connections: Not on file  Intimate Partner Violence: Not on file   Family History  Problem Relation Age of Onset   Heart attack Mother    Cardiomyopathy Mother    Cancer - Lung Father    Cancer - Other Maternal Grandmother    Stroke Paternal Grandfather    Diabetes Maternal Grandfather     Current Outpatient Medications  Medication Sig Dispense Refill   ACCU-CHEK COMPACT PLUS test strip CHECK BLOOD SUGAR  TWICE DAILY OR AS DIRECTED  0   acetaminophen (TYLENOL) 500 MG tablet Take 1,000 mg by mouth every 6 (six) hours as needed for headache or mild pain (pain).     apixaban (ELIQUIS) 5 MG TABS tablet Take 1 tablet (5 mg total) by mouth 2 (two) times daily. 60 tablet 3   Ascorbic Acid (VITAMIN C) 1000 MG tablet Take 2,000 mg by mouth daily.     cholecalciferol (VITAMIN D3) 25 MCG (1000 UT) tablet Take 1,000 Units by mouth daily.     diltiazem (CARDIZEM CD) 360 MG 24 hr capsule TAKE 1 CAPSULE BY MOUTH EVERY MORNING. 30 capsule 1   ezetimibe (ZETIA) 10 MG tablet Take 10 mg by mouth at bedtime.      ferrous sulfate 325 (65 FE) MG tablet Take 1 tablet  (325 mg total) by mouth 2 (two) times daily with a meal. 60 tablet 3   folic acid (FOLVITE) 1 MG tablet Take 1 mg by mouth every morning.      furosemide (LASIX) 40 MG tablet Take 1 tablet (40 mg total) by mouth daily. 30 tablet 3   gabapentin (NEURONTIN) 300 MG capsule Take 1 capsule (300 mg total) by mouth 3 (three) times daily. 90 capsule 2   glipiZIDE (GLUCOTROL) 10 MG tablet Take 10 mg by mouth every morning.      LANTUS SOLOSTAR 100 UNIT/ML Solostar Pen Inject 25 Units into the skin daily. Called [facility]- they state that last prescription that was picked up was ordered as Lantus 55 units daily 15 mL 11   magnesium oxide (MAG-OX) 400 MG tablet Take 1 tablet (400 mg total) by mouth daily. 30 tablet 5   metFORMIN (GLUCOPHAGE) 1000 MG tablet Take 1,000 mg by mouth 2 (two) times daily with a meal.     metoprolol succinate (TOPROL-XL) 50 MG 24 hr tablet TAKE 2 TABLETS IN THE MORNING. TAKE 1 TABLET AT NIGHT. PLEASE KEEP UPCOMING APPT IN APRIL 2022 BEFORE ANYMORE REFILLS. THANK YOU 270 tablet 3   MYRBETRIQ 50 MG TB24 tablet Take 50 mg by mouth daily.     nystatin ointment (MYCOSTATIN) Apply topically 2 (two) times daily. 30 g 0   potassium chloride (KLOR-CON) 10 MEQ tablet TAKE 1 TABLET BY MOUTH EVERY DAY 90 tablet 3   sertraline (ZOLOFT) 100 MG tablet Take 100 mg by mouth every morning.     telmisartan (MICARDIS) 80 MG tablet Take 80 mg by mouth daily.     vitamin B-12 (CYANOCOBALAMIN) 1000 MCG tablet Take 1 tablet (1,000 mcg total) by mouth every morning. 30 tablet 3   VITAMIN E PO Take 1 capsule by mouth once a week.     No current facility-administered medications for this visit.    Allergies  Allergen Reactions   Rosuvastatin Calcium Other (See Comments)    myalgias    Statins Other (See Comments)    myalgia   Amoxicillin Rash   Atorvastatin Other (See Comments)    Back pain    Ezetimibe-Simvastatin Other (See Comments)    Back pain    Penicillins Itching, Swelling and Rash    Did  it involve swelling of the face/tongue/throat, SOB, or low BP? No Did it involve sudden or severe rash/hives, skin peeling, or any reaction on the inside of your mouth or nose? Yes Did you need to seek medical attention at a hospital or doctor's office? No When did it last happen?   young adult    If all above answers are "  NO", may proceed with cephalosporin use.     REVIEW OF SYSTEMS:  [X]  denotes positive finding, [ ]  denotes negative finding Cardiac  Comments:  Chest pain or chest pressure:    Shortness of breath upon exertion:    Short of breath when lying flat:    Irregular heart rhythm:        Vascular    Pain in calf, thigh, or hip brought on by ambulation:    Pain in feet at night that wakes you up from your sleep:     Blood clot in your veins:    Leg swelling:         Pulmonary    Oxygen at home:    Productive cough:     Wheezing:         Neurologic    Sudden weakness in arms or legs:     Sudden numbness in arms or legs:     Sudden onset of difficulty speaking or slurred speech:    Temporary loss of vision in one eye:     Problems with dizziness:         Gastrointestinal    Blood in stool:     Vomited blood:         Genitourinary    Burning when urinating:     Blood in urine:        Psychiatric    Major depression:         Hematologic    Bleeding problems:    Problems with blood clotting too easily:        Skin    Rashes or ulcers:        Constitutional    Fever or chills:      PHYSICAL EXAMINATION:  There were no vitals filed for this visit.  General:  WDWN in NAD; vital signs documented above Gait: Not observed HENT: WNL, normocephalic Pulmonary: normal non-labored breathing , without wheezing Cardiac: regular HR, Abdomen: soft, NT, no masses Skin: without rashes -lichenification Vascular Exam/Pulses:  Right Left  Radial 2+ (normal) 2+ (normal)  Ulnar    Femoral    Popliteal    DP absent absent  PT absent absent   Extremities:  without ischemic changes, without Gangrene , without cellulitis; without open wounds;  Musculoskeletal: no muscle wasting or atrophy  Neurologic: A&O X 3;  No focal weakness or paresthesias are detected Psychiatric:  The pt has Normal affect.   Non-Invasive Vascular Imaging:   Summary:  Right: Resting right ankle-brachial index indicates noncompressible right  lower extremity arteries. The right toe-brachial index is normal.   Left: Resting left ankle-brachial index indicates noncompressible left  lower extremity arteries. The left toe-brachial index is normal.     ASSESSMENT/PLAN: Ahmirah Whiteley is a 81 y.o. female presenting with stage III primary lymphedema.  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  On physical exam, she has the classic buffalo hump appreciated on the dorsal aspect of her foot as well as nonpitting edema with lichenification in the calves consistent with late stages of lymphedema.  Unfortunately Occupational Therapy in [place] no longer offers manual lymphatic drainage.  With the size of her legs, Arionna is not a candidate for compression stockings, needing compression wraps instead.  Being that she has advanced lymphedema, she would benefit from referral to the [facility] lymphedema clinic for further evaluation and treatment.  I had a long conversation with [place] regarding the above.  She was receptive and excited to have the referral.  I  told her I am happy to help in any way possible and will have my office reach out to [facility]  Victorino Sparrow, MD [facility] [phone]

## 2022-04-11 ENCOUNTER — Ambulatory Visit: Payer: Medicare Other | Admitting: Vascular Surgery

## 2022-04-11 ENCOUNTER — Ambulatory Visit (INDEPENDENT_AMBULATORY_CARE_PROVIDER_SITE_OTHER)
Admission: RE | Admit: 2022-04-11 | Discharge: 2022-04-11 | Disposition: A | Discharge status: Home / Self Care | Payer: Medicare Other | Source: Ambulatory Visit | Attending: Vascular Surgery | Admitting: Vascular Surgery

## 2022-04-11 ENCOUNTER — Encounter: Payer: Self-pay | Admitting: Vascular Surgery

## 2022-04-11 ENCOUNTER — Ambulatory Visit (HOSPITAL_COMMUNITY)
Admission: RE | Admit: 2022-04-11 | Discharge: 2022-04-11 | Disposition: A | Discharge status: Home / Self Care | Payer: Medicare Other | Source: Ambulatory Visit | Attending: Vascular Surgery | Admitting: Vascular Surgery

## 2022-04-11 VITALS — HR 83 | Temp 98.0°F | Resp 20 | Ht 65.0 in | Wt 262.0 lb

## 2022-04-11 DIAGNOSIS — R600 Localized edema: Secondary | ICD-10-CM | POA: Diagnosis present

## 2022-04-11 DIAGNOSIS — M79672 Pain in left foot: Secondary | ICD-10-CM | POA: Diagnosis not present

## 2022-04-11 DIAGNOSIS — I890 Lymphedema, not elsewhere classified: Secondary | ICD-10-CM | POA: Diagnosis not present

## 2022-04-16 ENCOUNTER — Encounter: Payer: Self-pay | Admitting: Vascular Surgery

## 2022-04-29 ENCOUNTER — Inpatient Hospital Stay (HOSPITAL_COMMUNITY)
Admission: EM | Admit: 2022-04-29 | Discharge: 2022-05-05 | DRG: 689 | Disposition: A | Discharge status: Skilled Nursing Facility | Payer: Medicare Other | Attending: Internal Medicine | Admitting: Internal Medicine

## 2022-04-29 ENCOUNTER — Encounter (HOSPITAL_COMMUNITY): Payer: Self-pay

## 2022-04-29 ENCOUNTER — Other Ambulatory Visit: Payer: Self-pay

## 2022-04-29 ENCOUNTER — Emergency Department (HOSPITAL_COMMUNITY): Payer: Medicare Other

## 2022-04-29 DIAGNOSIS — E669 Obesity, unspecified: Secondary | ICD-10-CM | POA: Diagnosis present

## 2022-04-29 DIAGNOSIS — Z8249 Family history of ischemic heart disease and other diseases of the circulatory system: Secondary | ICD-10-CM

## 2022-04-29 DIAGNOSIS — Z79899 Other long term (current) drug therapy: Secondary | ICD-10-CM

## 2022-04-29 DIAGNOSIS — R4182 Altered mental status, unspecified: Principal | ICD-10-CM

## 2022-04-29 DIAGNOSIS — I340 Nonrheumatic mitral (valve) insufficiency: Secondary | ICD-10-CM | POA: Diagnosis present

## 2022-04-29 DIAGNOSIS — N390 Urinary tract infection, site not specified: Secondary | ICD-10-CM | POA: Diagnosis not present

## 2022-04-29 DIAGNOSIS — I10 Essential (primary) hypertension: Secondary | ICD-10-CM | POA: Diagnosis present

## 2022-04-29 DIAGNOSIS — I110 Hypertensive heart disease with heart failure: Secondary | ICD-10-CM | POA: Diagnosis present

## 2022-04-29 DIAGNOSIS — I4819 Other persistent atrial fibrillation: Secondary | ICD-10-CM | POA: Diagnosis present

## 2022-04-29 DIAGNOSIS — G4733 Obstructive sleep apnea (adult) (pediatric): Secondary | ICD-10-CM | POA: Diagnosis present

## 2022-04-29 DIAGNOSIS — G9340 Encephalopathy, unspecified: Secondary | ICD-10-CM | POA: Diagnosis present

## 2022-04-29 DIAGNOSIS — E785 Hyperlipidemia, unspecified: Secondary | ICD-10-CM | POA: Diagnosis present

## 2022-04-29 DIAGNOSIS — Z880 Allergy status to penicillin: Secondary | ICD-10-CM

## 2022-04-29 DIAGNOSIS — E781 Pure hyperglyceridemia: Secondary | ICD-10-CM | POA: Diagnosis present

## 2022-04-29 DIAGNOSIS — F05 Delirium due to known physiological condition: Secondary | ICD-10-CM | POA: Diagnosis present

## 2022-04-29 DIAGNOSIS — Z20822 Contact with and (suspected) exposure to covid-19: Secondary | ICD-10-CM | POA: Diagnosis present

## 2022-04-29 DIAGNOSIS — Z7984 Long term (current) use of oral hypoglycemic drugs: Secondary | ICD-10-CM

## 2022-04-29 DIAGNOSIS — I5033 Acute on chronic diastolic (congestive) heart failure: Secondary | ICD-10-CM | POA: Diagnosis present

## 2022-04-29 DIAGNOSIS — R600 Localized edema: Secondary | ICD-10-CM | POA: Diagnosis present

## 2022-04-29 DIAGNOSIS — F32A Depression, unspecified: Secondary | ICD-10-CM | POA: Diagnosis present

## 2022-04-29 DIAGNOSIS — Z888 Allergy status to other drugs, medicaments and biological substances status: Secondary | ICD-10-CM

## 2022-04-29 DIAGNOSIS — Z6841 Body Mass Index (BMI) 40.0 and over, adult: Secondary | ICD-10-CM

## 2022-04-29 DIAGNOSIS — M7989 Other specified soft tissue disorders: Secondary | ICD-10-CM | POA: Diagnosis present

## 2022-04-29 DIAGNOSIS — Z7901 Long term (current) use of anticoagulants: Secondary | ICD-10-CM

## 2022-04-29 DIAGNOSIS — I890 Lymphedema, not elsewhere classified: Secondary | ICD-10-CM | POA: Diagnosis present

## 2022-04-29 DIAGNOSIS — B961 Klebsiella pneumoniae [K. pneumoniae] as the cause of diseases classified elsewhere: Secondary | ICD-10-CM | POA: Diagnosis present

## 2022-04-29 DIAGNOSIS — G9341 Metabolic encephalopathy: Secondary | ICD-10-CM | POA: Diagnosis present

## 2022-04-29 DIAGNOSIS — Z794 Long term (current) use of insulin: Secondary | ICD-10-CM

## 2022-04-29 DIAGNOSIS — G3184 Mild cognitive impairment, so stated: Secondary | ICD-10-CM | POA: Diagnosis present

## 2022-04-29 DIAGNOSIS — E1165 Type 2 diabetes mellitus with hyperglycemia: Secondary | ICD-10-CM | POA: Diagnosis present

## 2022-04-29 DIAGNOSIS — Z87440 Personal history of urinary (tract) infections: Secondary | ICD-10-CM

## 2022-04-29 DIAGNOSIS — I4821 Permanent atrial fibrillation: Secondary | ICD-10-CM | POA: Diagnosis present

## 2022-04-29 DIAGNOSIS — Z833 Family history of diabetes mellitus: Secondary | ICD-10-CM

## 2022-04-29 DIAGNOSIS — G8929 Other chronic pain: Secondary | ICD-10-CM | POA: Diagnosis present

## 2022-04-29 LAB — CBC WITH DIFFERENTIAL/PLATELET
Abs Immature Granulocytes: 0.04 10*3/uL (ref 0.00–0.07)
Basophils Absolute: 0.1 10*3/uL (ref 0.0–0.1)
Basophils Relative: 1 %
Eosinophils Absolute: 0 10*3/uL (ref 0.0–0.5)
Eosinophils Relative: 0 %
HCT: 38.1 % (ref 36.0–46.0)
Hemoglobin: 11.8 g/dL — ABNORMAL LOW (ref 12.0–15.0)
Immature Granulocytes: 0 %
Lymphocytes Relative: 15 %
Lymphs Abs: 1.6 10*3/uL (ref 0.7–4.0)
MCH: 23.4 pg — ABNORMAL LOW (ref 26.0–34.0)
MCHC: 31 g/dL (ref 30.0–36.0)
MCV: 75.6 fL — ABNORMAL LOW (ref 80.0–100.0)
Monocytes Absolute: 1.3 10*3/uL — ABNORMAL HIGH (ref 0.1–1.0)
Monocytes Relative: 12 %
Neutro Abs: 7.8 10*3/uL — ABNORMAL HIGH (ref 1.7–7.7)
Neutrophils Relative %: 72 %
Platelets: 383 10*3/uL (ref 150–400)
RBC: 5.04 MIL/uL (ref 3.87–5.11)
RDW: 17.3 % — ABNORMAL HIGH (ref 11.5–15.5)
WBC: 10.9 10*3/uL — ABNORMAL HIGH (ref 4.0–10.5)
nRBC: 0 % (ref 0.0–0.2)

## 2022-04-29 LAB — ETHANOL: Alcohol, Ethyl (B): <10 mg/dL (ref <10)

## 2022-04-29 LAB — BASIC METABOLIC PANEL
Anion gap: 15 (ref 5–15)
BUN: 18 mg/dL (ref 8–23)
CO2: 19 mmol/L — ABNORMAL LOW (ref 22–32)
Calcium: 9.2 mg/dL (ref 8.9–10.3)
Chloride: 98 mmol/L (ref 98–111)
Creatinine, Ser: 1.11 mg/dL — ABNORMAL HIGH (ref 0.44–1.00)
GFR, Estimated: 51 mL/min — ABNORMAL LOW (ref >60)
Glucose, Bld: 466 mg/dL — ABNORMAL HIGH (ref 70–99)
Potassium: 3.9 mmol/L (ref 3.5–5.1)
Sodium: 132 mmol/L — ABNORMAL LOW (ref 135–145)

## 2022-04-29 LAB — PROTIME-INR
INR: 1.4 — ABNORMAL HIGH (ref 0.8–1.2)
Prothrombin Time: 16.8 seconds — ABNORMAL HIGH (ref 11.4–15.2)

## 2022-04-29 LAB — AMMONIA: Ammonia: <10 umol/L (ref 9–35)

## 2022-04-29 LAB — TROPONIN I (HIGH SENSITIVITY): Troponin I (High Sensitivity): 13 ng/L (ref <18)

## 2022-04-29 LAB — URINALYSIS, ROUTINE W REFLEX MICROSCOPIC
Bilirubin Urine: NEGATIVE
Glucose, UA: >=500 mg/dL — AB
Ketones, ur: 20 mg/dL — AB
Nitrite: NEGATIVE
Protein, ur: 100 mg/dL — AB
Specific Gravity, Urine: 1.028 (ref 1.005–1.030)
WBC, UA: >50 WBC/hpf — ABNORMAL HIGH (ref 0–5)
pH: 5 (ref 5.0–8.0)

## 2022-04-29 LAB — SARS CORONAVIRUS 2 BY RT PCR: SARS Coronavirus 2 by RT PCR: NEGATIVE

## 2022-04-29 LAB — RAPID URINE DRUG SCREEN, HOSP PERFORMED
Amphetamines: NOT DETECTED
Barbiturates: NOT DETECTED
Benzodiazepines: NOT DETECTED
Cocaine: NOT DETECTED
Opiates: NOT DETECTED
Tetrahydrocannabinol: NOT DETECTED

## 2022-04-29 MED ORDER — SODIUM CHLORIDE 0.9 % IV SOLN
1.0000 g | Freq: Once | INTRAVENOUS | Status: AC
  Administered 2022-04-30: 1 g via INTRAVENOUS
  Filled 2022-04-29: qty 10

## 2022-04-29 NOTE — ED Notes (Signed)
Patients husband would like updates when available.

## 2022-04-29 NOTE — ED Notes (Signed)
Patient continues to pull monitoring equipment off. Patient is informed to leave leads on, but continues to pull them off.

## 2022-04-29 NOTE — ED Notes (Signed)
In and Out performed with Armond Hang, RN.

## 2022-04-29 NOTE — ED Triage Notes (Signed)
Pt from home with AMS. Pt has had frequent UTIs and is non compliant with meds. CBG with ems was 565.

## 2022-04-29 NOTE — ED Provider Notes (Signed)
[facility] EMERGENCY DEPARTMENT Provider Note   CSN: [id number] Arrival date & time: 04/29/22  1903     History  Chief Complaint  Patient presents with   Altered Mental Status    Amy Wright is a 81 y.o. female presenting to ED with concern for altered mental status.  Patient has supplemental history provided by both her husband and lives with her and her daughter.  They report that the patient has had an acute change in her mental status over the past 24 hours.  Patient was found naked on the floor of the room today, rummaging through the belongings in the room, and speaking nonsensically.  They were concerned the patient has had UTIs in the past  On exam the patient herself is generally a poor historian.  She says she is here for swelling in her legs.  She is not certain why the family think she is confused.  The patient was admitted in June to the hospital, 2 months ago, with reported encephalopathy and confusion at that time, which is felt to be secondary to polypharmacy, including Ambien and Ativan which were prescribed.  She has no longer been taking those medications according to family does not have any prescription filed on PDMP.  Her daughter reports the patient's mental status has never been "quite back to normal" since she left the hospital.  She does wax and wane, sometimes has worsening confusion at night.  The patient had been on Coumadin for permanent A-fib and decision was made to transition her to Eliquis.  She is also being managed with vascular vein specialist for chronic peripheral lower extremity edema.   HPI     Home Medications Prior to Admission medications   Medication Sig Start Date End Date Taking? Authorizing Provider  ACCU-CHEK COMPACT PLUS test strip CHECK BLOOD SUGAR TWICE DAILY OR AS DIRECTED 02/27/16   [provider]  acetaminophen (TYLENOL) 500 MG tablet Take 1,000 mg by mouth every 6 (six) hours as needed for headache  or mild pain (pain).    [provider]  apixaban (ELIQUIS) 5 MG TABS tablet Take 1 tablet (5 mg total) by mouth 2 (two) times daily. 03/07/22   Ghimire, Werner Lean, MD  Ascorbic Acid (VITAMIN C) 1000 MG tablet Take 2,000 mg by mouth daily.    [provider]  cholecalciferol (VITAMIN D3) 25 MCG (1000 UT) tablet Take 1,000 Units by mouth daily.    [provider]  diltiazem (CARDIZEM CD) 360 MG 24 hr capsule TAKE 1 CAPSULE BY MOUTH EVERY MORNING. 03/28/22   Allred, Fayrene Fearing, MD  ezetimibe (ZETIA) 10 MG tablet Take 10 mg by mouth at bedtime.     [provider]  ferrous sulfate 325 (65 FE) MG tablet Take 1 tablet (325 mg total) by mouth 2 (two) times daily with a meal. 03/07/22   Ghimire, Werner Lean, MD  folic acid (FOLVITE) 1 MG tablet Take 1 mg by mouth every morning.     [provider]  furosemide (LASIX) 40 MG tablet Take 1 tablet (40 mg total) by mouth daily. 03/07/22   Ghimire, Werner Lean, MD  gabapentin (NEURONTIN) 300 MG capsule Take 1 capsule (300 mg total) by mouth 3 (three) times daily. 03/07/22 03/07/23  Ghimire, Werner Lean, MD  glipiZIDE (GLUCOTROL) 10 MG tablet Take 10 mg by mouth every morning.  03/24/17   [provider]  LANTUS SOLOSTAR 100 UNIT/ML Solostar Pen Inject 25 Units into the skin daily. Called [facility]-  they state that last prescription that was picked up was ordered as Lantus 55 units daily 03/07/22   Ghimire, Werner Lean, MD  magnesium oxide (MAG-OX) 400 MG tablet Take 1 tablet (400 mg total) by mouth daily. 02/14/16   Gypsy Balsam K, NP  metFORMIN (GLUCOPHAGE) 1000 MG tablet Take 1,000 mg by mouth 2 (two) times daily with a meal.    [provider]  metoprolol succinate (TOPROL-XL) 50 MG 24 hr tablet TAKE 2 TABLETS IN THE MORNING. TAKE 1 TABLET AT NIGHT. PLEASE KEEP UPCOMING APPT IN APRIL 2022 BEFORE ANYMORE REFILLS. THANK YOU 01/14/21   Allred, Fayrene Fearing, MD  MYRBETRIQ 50 MG TB24 tablet Take 50 mg by mouth daily. 01/21/21   [provider]  nystatin ointment (MYCOSTATIN) Apply topically 2 (two) times daily. 03/07/22   Ghimire, Werner Lean, MD  potassium chloride (KLOR-CON) 10 MEQ tablet TAKE 1 TABLET BY MOUTH EVERY DAY 01/14/21   Allred, Fayrene Fearing, MD  sertraline (ZOLOFT) 100 MG tablet Take 100 mg by mouth every morning. 02/04/17   [provider]  telmisartan (MICARDIS) 80 MG tablet Take 80 mg by mouth daily.    [provider]  vitamin B-12 (CYANOCOBALAMIN) 1000 MCG tablet Take 1 tablet (1,000 mcg total) by mouth every morning. 03/07/22   Ghimire, Werner Lean, MD  VITAMIN E PO Take 1 capsule by mouth once a week.    [provider]      Allergies    Rosuvastatin calcium, Statins, Amoxicillin, Atorvastatin, Ezetimibe-simvastatin, and Penicillins    Review of Systems   Review of Systems  Physical Exam Updated Vital Signs BP (!) 155/84   Pulse 92   Temp 99.3 F (37.4 C) (Oral)   Resp 17   Ht 5\' 5"  (1.651 m)   Wt 118 kg   SpO2 97%   BMI 43.29 kg/m  Physical Exam Constitutional:      General: She is not in acute distress. HENT:     Head: Normocephalic and atraumatic.  Eyes:     Conjunctiva/sclera: Conjunctivae normal.     Pupils: Pupils are equal, round, and reactive to light.  Cardiovascular:     Rate and Rhythm: Normal rate. Rhythm irregular.  Pulmonary:     Effort: Pulmonary effort is normal. No respiratory distress.  Abdominal:     General: There is no distension.     Tenderness: There is no abdominal tenderness.  Musculoskeletal:     Comments: Symmetrical lymphedema of the lower extremities  Skin:    General: Skin is warm and dry.  Neurological:     General: No focal deficit present.     Mental Status: She is alert. Mental status is at baseline.     ED Results / Procedures / Treatments   Labs (all labs ordered are listed, but only abnormal results are displayed) Labs Reviewed  BASIC METABOLIC PANEL - Abnormal; Notable for the following components:      Result Value    Sodium 132 (*)    CO2 19 (*)    Glucose, Bld 466 (*)    Creatinine, Ser 1.11 (*)    GFR, Estimated 51 (*)    All other components within normal limits  CBC WITH DIFFERENTIAL/PLATELET - Abnormal; Notable for the following components:   WBC 10.9 (*)    Hemoglobin 11.8 (*)    MCV 75.6 (*)    MCH 23.4 (*)    RDW 17.3 (*)    Neutro Abs 7.8 (*)    Monocytes Absolute 1.3 (*)  All other components within normal limits  URINALYSIS, ROUTINE W REFLEX MICROSCOPIC - Abnormal; Notable for the following components:   APPearance CLOUDY (*)    Glucose, UA >=500 (*)    Hgb urine dipstick SMALL (*)    Ketones, ur 20 (*)    Protein, ur 100 (*)    Leukocytes,Ua MODERATE (*)    WBC, UA >50 (*)    Bacteria, UA MANY (*)    All other components within normal limits  PROTIME-INR - Abnormal; Notable for the following components:   Prothrombin Time 16.8 (*)    INR 1.4 (*)    All other components within normal limits  SARS CORONAVIRUS 2 BY RT PCR  ETHANOL  RAPID URINE DRUG SCREEN, HOSP PERFORMED  AMMONIA  TROPONIN I (HIGH SENSITIVITY)    EKG EKG Interpretation  Date/Time:  Tuesday April 29 2022 19:20:24 EDT Ventricular Rate:  80 PR Interval:    QRS Duration: 93 QT Interval:  343 QTC Calculation: 396 R Axis:   70 Text Interpretation: Atrial fibrillation Ventricular premature complex Nonspecific T abnormalities, lateral leads Confirmed by Alvester Chou [phone]) on 04/29/2022 8:05:39 PM  Radiology CT HEAD WO CONTRAST ( )  Result Date: 04/29/2022 CLINICAL DATA:  Altered mental status. EXAM: CT HEAD WITHOUT CONTRAST TECHNIQUE: Contiguous axial images were obtained from the base of the skull through the vertex without intravenous contrast. RADIATION DOSE REDUCTION: This exam was performed according to the departmental dose-optimization program which includes automated exposure control, adjustment of the mA and/or kV according to patient size and/or use of iterative reconstruction technique.  COMPARISON:  Head CT dated 03/06/2022. FINDINGS: Brain: There is mild age-related atrophy and moderate chronic microvascular ischemic changes. There is no acute intracranial hemorrhage. No mass effect or midline shift. No extra-axial fluid collection. Vascular: No hyperdense vessel or unexpected calcification. Skull: Normal. Negative for fracture or focal lesion. Sinuses/Orbits: No acute finding. Other: None IMPRESSION: 1. No acute intracranial pathology. 2. Age-related atrophy and chronic microvascular ischemic changes. Electronically Signed   By: Elgie Collard M.D.   On: 04/29/2022 21:40   DG Chest 2 View  Result Date: 04/29/2022 CLINICAL DATA:  Chest discomfort.  Altered mental status. EXAM: CHEST - 2 VIEW COMPARISON:  01/25/2022 FINDINGS: Stable cardiomegaly. There is slight increased interstitial thickening. No focal airspace disease. No pleural effusion. No pneumothorax. Stable osseous structures. IMPRESSION: Unchanged cardiomegaly. Slight increased interstitial thickening may represent pulmonary edema. Electronically Signed   By: Narda Rutherford M.D.   On: 04/29/2022 21:03    Procedures Procedures    Medications Ordered in ED Medications  cefTRIAXone (ROCEPHIN) 1 g in sodium chloride 0.9 % 100 mL IVPB (has no administration in time range)    ED Course/ Medical Decision Making/ A&P Clinical Course as of 04/29/22 2343  Tue Apr 29, 2022  2324 Rocephin ordered.  Daughter [name] updated by phone.  Plan for admission for UTI [MT]  2335 Admitted to hospitalist [MT]    Clinical Course User Index [MT] Efe Fazzino, Kermit Balo, MD                           Medical Decision Making Amount and/or Complexity of Data Reviewed Labs: ordered. Radiology: ordered.  Risk Decision regarding hospitalization.   This patient presents to the Emergency Department with complaint of altered mental status.  This involves an extensive number of treatment options, and is a complaint that carries with it a  high risk of complications and morbidity.  The  differential diagnosis includes hypoglycemia vs metabolic encephalopathy vs infection (including cystitis) vs ICH vs stroke vs polypharmacy vs other  I ordered, reviewed, and interpreted labs, including hyperglycemia without evidence of DKA.  No anion gap.  UA suggestive of possible infection with leukocytes and bacteria and white blood cells noted, negative nitrites. I ordered medication Rocephin for suspected UTI. She does not show signs of sepsis at this time. I ordered imaging studies which included x-ray of the chest, CT of the head I independently visualized and interpreted imaging which showed no emergent findings to explain the patient's symptoms Additional history was obtained from patient's husband and daughter by phone, EMS on arrival. External records obtained and reviewed showing hospital discharge summary from June reviewed, including work-up in the hospital at that time I personally reviewed the patients ECG which showed rate controlled A-fib with no acute ischemic findings  After the interventions stated above, I reevaluated the patient and found she remained stable but confused.  Will need medical admission at this time         Final Clinical Impression(s) / ED Diagnoses Final diagnoses:  Altered mental status, unspecified altered mental status type  Urinary tract infection with hematuria, site unspecified    Rx / DC Orders ED Discharge Orders     None         Amilia Vandenbrink, Kermit Balo, MD 04/29/22 [phone]

## 2022-04-30 DIAGNOSIS — Z6841 Body Mass Index (BMI) 40.0 and over, adult: Secondary | ICD-10-CM | POA: Diagnosis not present

## 2022-04-30 DIAGNOSIS — I10 Essential (primary) hypertension: Secondary | ICD-10-CM

## 2022-04-30 DIAGNOSIS — M7989 Other specified soft tissue disorders: Secondary | ICD-10-CM | POA: Diagnosis present

## 2022-04-30 DIAGNOSIS — I4821 Permanent atrial fibrillation: Secondary | ICD-10-CM | POA: Diagnosis present

## 2022-04-30 DIAGNOSIS — R4182 Altered mental status, unspecified: Secondary | ICD-10-CM | POA: Diagnosis not present

## 2022-04-30 DIAGNOSIS — I340 Nonrheumatic mitral (valve) insufficiency: Secondary | ICD-10-CM | POA: Diagnosis present

## 2022-04-30 DIAGNOSIS — N390 Urinary tract infection, site not specified: Secondary | ICD-10-CM | POA: Diagnosis present

## 2022-04-30 DIAGNOSIS — I110 Hypertensive heart disease with heart failure: Secondary | ICD-10-CM | POA: Diagnosis present

## 2022-04-30 DIAGNOSIS — Z888 Allergy status to other drugs, medicaments and biological substances status: Secondary | ICD-10-CM | POA: Diagnosis not present

## 2022-04-30 DIAGNOSIS — Z833 Family history of diabetes mellitus: Secondary | ICD-10-CM | POA: Diagnosis not present

## 2022-04-30 DIAGNOSIS — E669 Obesity, unspecified: Secondary | ICD-10-CM | POA: Diagnosis present

## 2022-04-30 DIAGNOSIS — I4819 Other persistent atrial fibrillation: Secondary | ICD-10-CM | POA: Diagnosis not present

## 2022-04-30 DIAGNOSIS — F05 Delirium due to known physiological condition: Secondary | ICD-10-CM | POA: Diagnosis present

## 2022-04-30 DIAGNOSIS — I890 Lymphedema, not elsewhere classified: Secondary | ICD-10-CM | POA: Diagnosis present

## 2022-04-30 DIAGNOSIS — Z8249 Family history of ischemic heart disease and other diseases of the circulatory system: Secondary | ICD-10-CM | POA: Diagnosis not present

## 2022-04-30 DIAGNOSIS — B961 Klebsiella pneumoniae [K. pneumoniae] as the cause of diseases classified elsewhere: Secondary | ICD-10-CM | POA: Diagnosis present

## 2022-04-30 DIAGNOSIS — I5033 Acute on chronic diastolic (congestive) heart failure: Secondary | ICD-10-CM | POA: Diagnosis present

## 2022-04-30 DIAGNOSIS — Z87440 Personal history of urinary (tract) infections: Secondary | ICD-10-CM | POA: Diagnosis not present

## 2022-04-30 DIAGNOSIS — Z880 Allergy status to penicillin: Secondary | ICD-10-CM | POA: Diagnosis not present

## 2022-04-30 DIAGNOSIS — E781 Pure hyperglyceridemia: Secondary | ICD-10-CM | POA: Diagnosis present

## 2022-04-30 DIAGNOSIS — G3184 Mild cognitive impairment, so stated: Secondary | ICD-10-CM | POA: Diagnosis present

## 2022-04-30 DIAGNOSIS — E1165 Type 2 diabetes mellitus with hyperglycemia: Secondary | ICD-10-CM

## 2022-04-30 DIAGNOSIS — G9340 Encephalopathy, unspecified: Secondary | ICD-10-CM | POA: Diagnosis present

## 2022-04-30 DIAGNOSIS — Z20822 Contact with and (suspected) exposure to covid-19: Secondary | ICD-10-CM | POA: Diagnosis present

## 2022-04-30 DIAGNOSIS — G9341 Metabolic encephalopathy: Secondary | ICD-10-CM | POA: Diagnosis present

## 2022-04-30 DIAGNOSIS — Z794 Long term (current) use of insulin: Secondary | ICD-10-CM | POA: Diagnosis not present

## 2022-04-30 DIAGNOSIS — E785 Hyperlipidemia, unspecified: Secondary | ICD-10-CM | POA: Diagnosis present

## 2022-04-30 DIAGNOSIS — F32A Depression, unspecified: Secondary | ICD-10-CM | POA: Diagnosis present

## 2022-04-30 DIAGNOSIS — G4733 Obstructive sleep apnea (adult) (pediatric): Secondary | ICD-10-CM | POA: Diagnosis present

## 2022-04-30 LAB — BASIC METABOLIC PANEL
Anion gap: 12 (ref 5–15)
BUN: 18 mg/dL (ref 8–23)
CO2: 18 mmol/L — ABNORMAL LOW (ref 22–32)
Calcium: 9.1 mg/dL (ref 8.9–10.3)
Chloride: 103 mmol/L (ref 98–111)
Creatinine, Ser: 1.18 mg/dL — ABNORMAL HIGH (ref 0.44–1.00)
GFR, Estimated: 47 mL/min — ABNORMAL LOW (ref >60)
Glucose, Bld: 332 mg/dL — ABNORMAL HIGH (ref 70–99)
Potassium: 3.7 mmol/L (ref 3.5–5.1)
Sodium: 133 mmol/L — ABNORMAL LOW (ref 135–145)

## 2022-04-30 LAB — CBC
HCT: 37.3 % (ref 36.0–46.0)
Hemoglobin: 11.7 g/dL — ABNORMAL LOW (ref 12.0–15.0)
MCH: 23.5 pg — ABNORMAL LOW (ref 26.0–34.0)
MCHC: 31.4 g/dL (ref 30.0–36.0)
MCV: 74.9 fL — ABNORMAL LOW (ref 80.0–100.0)
Platelets: 378 10*3/uL (ref 150–400)
RBC: 4.98 MIL/uL (ref 3.87–5.11)
RDW: 17.4 % — ABNORMAL HIGH (ref 11.5–15.5)
WBC: 9.2 10*3/uL (ref 4.0–10.5)
nRBC: 0 % (ref 0.0–0.2)

## 2022-04-30 LAB — CBG MONITORING, ED
Glucose-Capillary: 509 mg/dL (ref 70–99)
Glucose-Capillary: 405 mg/dL — ABNORMAL HIGH (ref 70–99)
Glucose-Capillary: 342 mg/dL — ABNORMAL HIGH (ref 70–99)
Glucose-Capillary: 329 mg/dL — ABNORMAL HIGH (ref 70–99)
Glucose-Capillary: 277 mg/dL — ABNORMAL HIGH (ref 70–99)
Glucose-Capillary: 185 mg/dL — ABNORMAL HIGH (ref 70–99)
Glucose-Capillary: 191 mg/dL — ABNORMAL HIGH (ref 70–99)
Glucose-Capillary: 209 mg/dL — ABNORMAL HIGH (ref 70–99)
Glucose-Capillary: 150 mg/dL — ABNORMAL HIGH (ref 70–99)
Glucose-Capillary: 180 mg/dL — ABNORMAL HIGH (ref 70–99)
Glucose-Capillary: 164 mg/dL — ABNORMAL HIGH (ref 70–99)
Glucose-Capillary: 131 mg/dL — ABNORMAL HIGH (ref 70–99)

## 2022-04-30 LAB — GLUCOSE, CAPILLARY
Glucose-Capillary: 259 mg/dL — ABNORMAL HIGH (ref 70–99)
Glucose-Capillary: 223 mg/dL — ABNORMAL HIGH (ref 70–99)

## 2022-04-30 MED ORDER — EZETIMIBE 10 MG PO TABS
10.0000 mg | ORAL_TABLET | Freq: Every day | ORAL | Status: DC
  Administered 2022-04-30 – 2022-05-04 (×5): 10 mg via ORAL
  Filled 2022-04-30 (×5): qty 1

## 2022-04-30 MED ORDER — INSULIN ASPART 100 UNIT/ML IJ SOLN
0.0000 [IU] | INTRAMUSCULAR | Status: DC
  Administered 2022-04-30: 2 [IU] via SUBCUTANEOUS
  Administered 2022-04-30: 5 [IU] via SUBCUTANEOUS
  Administered 2022-04-30 – 2022-05-01 (×6): 3 [IU] via SUBCUTANEOUS
  Administered 2022-05-02: 5 [IU] via SUBCUTANEOUS
  Administered 2022-05-02 – 2022-05-03 (×5): 3 [IU] via SUBCUTANEOUS
  Administered 2022-05-03: 2 [IU] via SUBCUTANEOUS

## 2022-04-30 MED ORDER — DILTIAZEM HCL ER COATED BEADS 180 MG PO CP24
360.0000 mg | ORAL_CAPSULE | Freq: Every day | ORAL | Status: DC
  Administered 2022-04-30 – 2022-05-05 (×6): 360 mg via ORAL
  Filled 2022-04-30 (×4): qty 2
  Filled 2022-04-30: qty 1
  Filled 2022-04-30: qty 2

## 2022-04-30 MED ORDER — DEXTROSE IN LACTATED RINGERS 5 % IV SOLN: INTRAVENOUS | Status: DC

## 2022-04-30 MED ORDER — ACETAMINOPHEN 325 MG PO TABS: 650.0000 mg | ORAL_TABLET | Freq: Four times a day (QID) | ORAL | Status: DC | PRN

## 2022-04-30 MED ORDER — FUROSEMIDE 40 MG PO TABS
40.0000 mg | ORAL_TABLET | Freq: Every day | ORAL | Status: DC
Start: 2022-05-01 — End: 2022-05-04
  Administered 2022-05-01 – 2022-05-03 (×2): 40 mg via ORAL
  Filled 2022-04-30 (×2): qty 1

## 2022-04-30 MED ORDER — INSULIN REGULAR(HUMAN) IN NACL 100-0.9 UT/100ML-% IV SOLN
INTRAVENOUS | Status: DC
  Administered 2022-04-30: 2 [IU]/h via INTRAVENOUS
  Administered 2022-04-30: 13 [IU]/h via INTRAVENOUS
  Filled 2022-04-30: qty 100

## 2022-04-30 MED ORDER — FOLIC ACID 1 MG PO TABS
1.0000 mg | ORAL_TABLET | Freq: Every morning | ORAL | Status: DC
  Administered 2022-05-01 – 2022-05-05 (×5): 1 mg via ORAL
  Filled 2022-04-30 (×5): qty 1

## 2022-04-30 MED ORDER — VITAMIN D 25 MCG (1000 UNIT) PO TABS
1000.0000 [IU] | ORAL_TABLET | Freq: Every day | ORAL | Status: DC
  Administered 2022-04-30 – 2022-05-05 (×6): 1000 [IU] via ORAL
  Filled 2022-04-30 (×6): qty 1

## 2022-04-30 MED ORDER — LACTATED RINGERS IV SOLN: INTRAVENOUS | Status: DC

## 2022-04-30 MED ORDER — ACETAMINOPHEN 650 MG RE SUPP: 650.0000 mg | Freq: Four times a day (QID) | RECTAL | Status: DC | PRN

## 2022-04-30 MED ORDER — INSULIN GLARGINE-YFGN 100 UNIT/ML ~~LOC~~ SOLN
28.0000 [IU] | Freq: Every day | SUBCUTANEOUS | Status: DC
  Administered 2022-04-30 – 2022-05-01 (×2): 28 [IU] via SUBCUTANEOUS
  Filled 2022-04-30 (×3): qty 0.28

## 2022-04-30 MED ORDER — FUROSEMIDE 10 MG/ML IJ SOLN
20.0000 mg | Freq: Once | INTRAMUSCULAR | Status: AC
  Administered 2022-04-30: 20 mg via INTRAVENOUS
  Filled 2022-04-30: qty 4

## 2022-04-30 MED ORDER — HALOPERIDOL LACTATE 5 MG/ML IJ SOLN
2.0000 mg | Freq: Four times a day (QID) | INTRAMUSCULAR | Status: DC | PRN
  Administered 2022-04-30: 2 mg via INTRAVENOUS
  Administered 2022-05-01: 4 mg via INTRAVENOUS
  Filled 2022-04-30 (×4): qty 1

## 2022-04-30 MED ORDER — SODIUM CHLORIDE 0.9 % IV SOLN
1.0000 g | INTRAVENOUS | Status: DC
  Administered 2022-04-30 – 2022-05-04 (×5): 1 g via INTRAVENOUS
  Filled 2022-04-30 (×5): qty 10

## 2022-04-30 MED ORDER — ONDANSETRON HCL 4 MG PO TABS: 4.0000 mg | ORAL_TABLET | Freq: Four times a day (QID) | ORAL | Status: DC | PRN

## 2022-04-30 MED ORDER — DEXTROSE 50 % IV SOLN: 0.0000 mL | INTRAVENOUS | Status: DC | PRN

## 2022-04-30 MED ORDER — HALOPERIDOL LACTATE 5 MG/ML IJ SOLN
5.0000 mg | Freq: Once | INTRAMUSCULAR | Status: DC
Start: 2022-04-30 — End: 2022-04-30

## 2022-04-30 MED ORDER — ONDANSETRON HCL 4 MG/2ML IJ SOLN: 4.0000 mg | Freq: Four times a day (QID) | INTRAMUSCULAR | Status: DC | PRN

## 2022-04-30 MED ORDER — APIXABAN 5 MG PO TABS
5.0000 mg | ORAL_TABLET | Freq: Two times a day (BID) | ORAL | Status: DC
  Administered 2022-04-30 – 2022-05-05 (×10): 5 mg via ORAL
  Filled 2022-04-30 (×10): qty 1

## 2022-04-30 MED ORDER — ACETAMINOPHEN 500 MG PO TABS
1000.0000 mg | ORAL_TABLET | Freq: Four times a day (QID) | ORAL | Status: DC | PRN
  Administered 2022-05-05: 1000 mg via ORAL
  Filled 2022-04-30: qty 2

## 2022-04-30 NOTE — ED Notes (Signed)
Patient found attempting to get off of bed "I want to go to those two little boys who just passed by in the corner", patient repositioned back onto bed.    Patient noted to have increased SOB during attempt to get off of bed.

## 2022-04-30 NOTE — Progress Notes (Signed)
Putting in PRN haldol for agitation

## 2022-04-30 NOTE — Assessment & Plan Note (Addendum)
History of same, lymphedema. Erythema, chronic pain, and degree of lymphedema are all baseline per patient. Today looks very similar to the 8/4 and 7/5 photos in the media tab of her chart.  Maybe a little bit more erythema BLE today, but this seems to be symmetric and may just be due to lighting / camera image.

## 2022-04-30 NOTE — Assessment & Plan Note (Addendum)
Consistent with delirium presumably secondary to UTI.  H/o same multiple times in past per family (and chart too). Most recently admitted end of June with delirium secondary to cellulitis.  Similar visual hallucinations described on DC summary. 1. Treat UTI as below. 2. Haldol PRN agitation 1. Tele monitor

## 2022-04-30 NOTE — Progress Notes (Signed)
PROGRESS NOTE    Amy Wright  [id number] DOB: 1940/09/15 DOA: 04/29/2022 PCP: Robyne Peers, MD   Chief Complaint  Patient presents with   Altered Mental Status    Brief Narrative:   This is a no charge note as patient was seen and admitted earlier today by Dr. Alcario Drought, patient was seen and examined, chart imaging and labs were reviewed.   Amy Wright is a 81 y.o. female with medical history significant of HTN, HLD, OSA, DM2, recurrent UTIs, Perm AF on eliquis.   Pt with h/o delirium with prior UTIs per family.   Pt last admitted in May of this year with delirium.  Believed to be due to polypharmacy (ativan + ambien) and possibly UTI as well (UCx with 100k CFU of K.Pneumo).   Since then, no longer on ativan nor ambien.   Today pt with AMS onset, persistent, and worsening over the past 24h.   Patient was found naked on the floor of the room today, rummaging through the belongings in the room, and speaking nonsensically.  This is similar to prior delirium with UTIs.   Her daughter reports the patient's mental status has never been "quite back to normal" since she left the hospital.  She does wax and wane, sometimes has worsening confusion at night.    Assessment & Plan:   Principal Problem:   Encephalopathy acute Active Problems:   Recurrent UTI   Uncontrolled type 2 diabetes mellitus with hyperglycemia (HCC)   Swelling of right hand   Persistent atrial fibrillation (HCC)   Essential hypertension   Lower extremity edema   Acute encephalopathy  Encephalopathy metabolic acute Consistent with delirium presumably secondary to UTI.  H/o same multiple times in past per family (and chart too). -As well she does appear to be having hospital delirium as well -He remains confused  Recurrent UTI UA today c/w UTI Empiric rocephin for the moment UCx ordered Mostly sensitive K.Pneumo on UA in May 2023   Swelling of right hand Possibly just due to coban wrapping  around her elbow IV site? No erythema, no TTP.  Doesn't seem to be infected at this point. Consider imaging if this persists   Uncontrolled type 2 diabetes mellitus with hyperglycemia (HCC) BGL 509 in ED -Was on insulin drip per protocol, she does not have DKA on admission, I will transition her to [facility] and insulin sliding scale.  Lower extremity edema History of same, lymphedema. Erythema, chronic pain, and degree of lymphedema are all baseline per patient. Today looks very similar to the 8/4 and 7/5 photos in the media tab of her chart.  Maybe a little bit more erythema BLE today, but this seems to be symmetric and may just be due to lighting / camera image.   Essential hypertension Continue home BP meds   Persistent atrial fibrillation (HCC) Continue eliquis Continue cardizem Not sure if still on metoprolol or not, med rec pending at this time.     DVT prophylaxis:Eliquis  Code Status: Full Family Communication: D/W daughter by phone Disposition:   Status is: Inpatient  Consultants:  none   Subjective:  She is currently denies any complaints, as discussed with staff patient has been agitated  Objective: Vitals:   04/30/22 1000 04/30/22 1043 04/30/22 1350 04/30/22 1508  BP:   (!) 126/49   Pulse: 99 (!) 107 94   Resp: (!) 24 20 20    Temp:  98.3 F (36.8 C)  98 F (36.7 C)  TempSrc:  Oral  Axillary  SpO2: 94% 95% 96%   Weight:      Height:        Intake/Output Summary (Last 24 hours) at 04/30/2022 1552 Last data filed at 04/30/2022 1228 Gross per 24 hour  Intake 1167.57 ml  Output --  Net 1167.57 ml   Filed Weights   04/29/22 1918  Weight: 118 kg    Examination:  Awake Alert, confused, restless Symmetrical Chest wall movement, Good air movement bilaterally, CTAB RRR,No Gallops,Rubs or new Murmurs, No Parasternal Heave +ve B.Sounds, Abd Soft, No tenderness, No rebound - guarding or rigidity. No Cyanosis, Clubbing or edema, No new Rash or bruise        Data Reviewed: I have personally reviewed following labs and imaging studies  CBC: Recent Labs  Lab 04/29/22 2057 04/30/22 0355  WBC 10.9* 9.2  NEUTROABS 7.8*  --   HGB 11.8* 11.7*  HCT 38.1 37.3  MCV 75.6* 74.9*  PLT 383 378    Basic Metabolic Panel: Recent Labs  Lab 04/29/22 2057 04/30/22 0355  NA 132* 133*  K 3.9 3.7  CL 98 103  CO2 19* 18*  GLUCOSE 466* 332*  BUN 18 18  CREATININE 1.11* 1.18*  CALCIUM 9.2 9.1    GFR: Estimated Creatinine Clearance: 49.7 mL/min (A) (by C-G formula based on SCr of 1.18 mg/dL (H)).  Liver Function Tests: No results for input(s): "AST", "ALT", "ALKPHOS", "BILITOT", "PROT", "ALBUMIN" in the last 168 hours.  CBG: Recent Labs  Lab 04/30/22 0847 04/30/22 0949 04/30/22 1056 04/30/22 1222 04/30/22 1335  GLUCAP 191* 209* 150* 180* 164*     Recent Results (from the past 240 hour(s))  SARS Coronavirus 2 by RT PCR (hospital order, performed in [facility] hospital lab) *cepheid single result test* Anterior Nasal Swab     Status: None   Collection Time: 04/29/22  8:17 PM   Specimen: Anterior Nasal Swab  Result Value Ref Range Status   SARS Coronavirus 2 by RT PCR NEGATIVE NEGATIVE Final    Comment: (NOTE) SARS-CoV-2 target nucleic acids are NOT DETECTED.  The SARS-CoV-2 RNA is generally detectable in upper and lower respiratory specimens during the acute phase of infection. The lowest concentration of SARS-CoV-2 viral copies this assay can detect is 250 copies / mL. A negative result does not preclude SARS-CoV-2 infection and should not be used as the sole basis for treatment or other patient management decisions.  A negative result may occur with improper specimen collection / handling, submission of specimen other than nasopharyngeal swab, presence of viral mutation(s) within the areas targeted by this assay, and inadequate number of viral copies (<250 copies / mL). A negative result must be combined with  clinical observations, patient history, and epidemiological information.  Fact Sheet for Patients:   RoadLapTop.co.za  Fact Sheet for Healthcare Providers: http://kim-miller.com/  This test is not yet approved or  cleared by the [place] FDA and has been authorized for detection and/or diagnosis of SARS-CoV-2 by FDA under an Emergency Use Authorization (EUA).  This EUA will remain in effect (meaning this test can be used) for the duration of the COVID-19 declaration under Section 564(b)(1) of the Act, 21 U.S.C. section 360bbb-3(b)(1), unless the authorization is terminated or revoked sooner.  Performed at [facility], [address] [place], [place] [address]          Radiology Studies: CT HEAD WO CONTRAST ( )  Result Date: 04/29/2022 CLINICAL DATA:  Altered mental status. EXAM: CT HEAD WITHOUT CONTRAST  TECHNIQUE: Contiguous axial images were obtained from the base of the skull through the vertex without intravenous contrast. RADIATION DOSE REDUCTION: This exam was performed according to the departmental dose-optimization program which includes automated exposure control, adjustment of the mA and/or kV according to patient size and/or use of iterative reconstruction technique. COMPARISON:  Head CT dated 03/06/2022. FINDINGS: Brain: There is mild age-related atrophy and moderate chronic microvascular ischemic changes. There is no acute intracranial hemorrhage. No mass effect or midline shift. No extra-axial fluid collection. Vascular: No hyperdense vessel or unexpected calcification. Skull: Normal. Negative for fracture or focal lesion. Sinuses/Orbits: No acute finding. Other: None IMPRESSION: 1. No acute intracranial pathology. 2. Age-related atrophy and chronic microvascular ischemic changes. Electronically Signed   By: Elgie Collard M.D.   On: 04/29/2022 21:40   DG Chest 2 View  Result Date: 04/29/2022 CLINICAL DATA:   Chest discomfort.  Altered mental status. EXAM: CHEST - 2 VIEW COMPARISON:  01/25/2022 FINDINGS: Stable cardiomegaly. There is slight increased interstitial thickening. No focal airspace disease. No pleural effusion. No pneumothorax. Stable osseous structures. IMPRESSION: Unchanged cardiomegaly. Slight increased interstitial thickening may represent pulmonary edema. Electronically Signed   By: Narda Rutherford M.D.   On: 04/29/2022 21:03        Scheduled Meds:  apixaban  5 mg Oral BID   diltiazem  360 mg Oral Daily   Continuous Infusions:  cefTRIAXone (ROCEPHIN)  IV     dextrose 5% lactated ringers 125 mL/hr at 04/30/22 1228   insulin 3.6 Units/hr (04/30/22 1228)   lactated ringers Stopped (04/30/22 0747)     LOS: 0 days     Huey Bienenstock, MD [facility]   To contact the attending provider between 7A-7P or the covering provider during after hours 7P-7A, please log into the web site [email or web address] and access using universal [place] password for that web site. If you do not have the password, please call the hospital operator.  04/30/2022, 3:52 PM

## 2022-04-30 NOTE — ED Notes (Signed)
Pt attempting to remove self from monitor. Pt states "the man over there is saying to take it off". Pt placed back on cardiac monitor at this time

## 2022-04-30 NOTE — Assessment & Plan Note (Signed)
Possibly just due to coban wrapping around her elbow IV site? No erythema, no TTP.  Doesn't seem to be infected at this point. 1. Consider imaging if this persists

## 2022-04-30 NOTE — Assessment & Plan Note (Addendum)
UA today c/w UTI 1. Empiric rocephin for the moment 2. UCx ordered 1. Mostly sensitive K.Pneumo on UA in May 2023

## 2022-04-30 NOTE — H&P (Addendum)
History and Physical    Patient: Amy Wright OQH:[id number] DOB: 10-10-1940 DOA: 04/29/2022 DOS: the patient was seen and examined on 04/30/2022 PCP: Angelica Chessman, MD  Patient coming from: Home  Chief Complaint:  Chief Complaint  Patient presents with   Altered Mental Status   HPI: Amy Wright is a 81 y.o. female with medical history significant of HTN, HLD, OSA, DM2, recurrent UTIs, Perm AF on eliquis.  Pt with h/o delirium with prior UTIs per family.  Pt last admitted in May of this year with delirium.  Believed to be due to polypharmacy (ativan + ambien) and possibly UTI as well (UCx with 100k CFU of K.Pneumo).  Since then, no longer on ativan nor ambien.  Today pt with AMS onset, persistent, and worsening over the past 24h.  Patient was found naked on the floor of the room today, rummaging through the belongings in the room, and speaking nonsensically.  This is similar to prior delirium with UTIs.  Her daughter reports the patient's mental status has never been "quite back to normal" since she left the hospital.  She does wax and wane, sometimes has worsening confusion at night.  ROS with patient: Pt reports BLE "lymphedema" that is chronic, also erythema to BLE is chronic and no worse than baseline.  Has BLE pain with her lymphedema she says but this is no worse than baseline right now.  LUE swelling in ED is new and not baseline.  No pain in LUE hand or forearm except where the nurse has used coban on the IV site somewhat tightly (so that patient cant pull the IV out).  Pt denies open wounds, or bed sores.  Review of Systems: As mentioned in the history of present illness. All other systems reviewed and are negative. Past Medical History:  Diagnosis Date   Anxiety    Chronic insomnia    Hyperlipidemia    Hypertension    Hypertriglyceridemia    Obesity    OSA on CPAP    Persistent atrial fibrillation (HCC) 11/07/14   Chads2vasc score of at least 4   Type  II diabetes mellitus (HCC)    Valvular sclerosis 04/30/11   ECHO-Mitral Valve- mild to moderate mitral regurgitation. Central jet. Aortic Valve appears to be mildly sclerotic. Trace aortic regurgitation.EF >55%   Past Surgical History:  Procedure Laterality Date   APPENDECTOMY  1971   CARDIOVERSION N/A 06/23/2013   Procedure: CARDIOVERSION;  Surgeon: Lennette Bihari, MD;  Location: [facility] ENDOSCOPY;  Service: Cardiovascular;  Laterality: N/A;   CATARACT EXTRACTION W/ INTRAOCULAR LENS IMPLANT Bilateral    CESAREAN SECTION  1971   PLACEMENT OF BREAST IMPLANTS Bilateral 1985   Social History:  reports that she has never smoked. She has been exposed to tobacco smoke. She has never used smokeless tobacco. She reports current alcohol use. She reports that she does not use drugs.  Allergies  Allergen Reactions   Rosuvastatin Calcium Other (See Comments)    myalgias    Statins Other (See Comments)    myalgia   Amoxicillin Rash   Atorvastatin Other (See Comments)    Back pain    Ezetimibe-Simvastatin Other (See Comments)    Back pain    Penicillins Itching, Swelling and Rash    Did it involve swelling of the face/tongue/throat, SOB, or low BP? No Did it involve sudden or severe rash/hives, skin peeling, or any reaction on the inside of your mouth or nose? Yes Did you need to seek medical attention at  a hospital or doctor's office? No When did it last happen?   young adult    If all above answers are "NO", may proceed with cephalosporin use.    Family History  Problem Relation Age of Onset   Heart attack Mother    Cardiomyopathy Mother    Cancer - Lung Father    Cancer - Other Maternal Grandmother    Stroke Paternal Grandfather    Diabetes Maternal Grandfather     Prior to Admission medications   Medication Sig Start Date End Date Taking? Authorizing Provider  ACCU-CHEK COMPACT PLUS test strip CHECK BLOOD SUGAR TWICE DAILY OR AS DIRECTED 02/27/16   [provider]   acetaminophen (TYLENOL) 500 MG tablet Take 1,000 mg by mouth every 6 (six) hours as needed for headache or mild pain (pain).    [provider]  apixaban (ELIQUIS) 5 MG TABS tablet Take 1 tablet (5 mg total) by mouth 2 (two) times daily. 03/07/22   Ghimire, Werner Lean, MD  Ascorbic Acid (VITAMIN C) 1000 MG tablet Take 2,000 mg by mouth daily.    [provider]  cholecalciferol (VITAMIN D3) 25 MCG (1000 UT) tablet Take 1,000 Units by mouth daily.    [provider]  diltiazem (CARDIZEM CD) 360 MG 24 hr capsule TAKE 1 CAPSULE BY MOUTH EVERY MORNING. 03/28/22   Allred, Fayrene Fearing, MD  ezetimibe (ZETIA) 10 MG tablet Take 10 mg by mouth at bedtime.     [provider]  ferrous sulfate 325 (65 FE) MG tablet Take 1 tablet (325 mg total) by mouth 2 (two) times daily with a meal. 03/07/22   Ghimire, Werner Lean, MD  folic acid (FOLVITE) 1 MG tablet Take 1 mg by mouth every morning.     [provider]  furosemide (LASIX) 40 MG tablet Take 1 tablet (40 mg total) by mouth daily. 03/07/22   Ghimire, Werner Lean, MD  gabapentin (NEURONTIN) 300 MG capsule Take 1 capsule (300 mg total) by mouth 3 (three) times daily. 03/07/22 03/07/23  Ghimire, Werner Lean, MD  glipiZIDE (GLUCOTROL) 10 MG tablet Take 10 mg by mouth every morning.  03/24/17   [provider]  LANTUS SOLOSTAR 100 UNIT/ML Solostar Pen Inject 25 Units into the skin daily. Called [facility]- they state that last prescription that was picked up was ordered as Lantus 55 units daily 03/07/22   Ghimire, Werner Lean, MD  magnesium oxide (MAG-OX) 400 MG tablet Take 1 tablet (400 mg total) by mouth daily. 02/14/16   Gypsy Balsam K, NP  metFORMIN (GLUCOPHAGE) 1000 MG tablet Take 1,000 mg by mouth 2 (two) times daily with a meal.    [provider]  metoprolol succinate (TOPROL-XL) 50 MG 24 hr tablet TAKE 2 TABLETS IN THE MORNING. TAKE 1 TABLET AT NIGHT. PLEASE KEEP UPCOMING APPT IN APRIL 2022 BEFORE ANYMORE REFILLS. THANK YOU  01/14/21   Allred, Fayrene Fearing, MD  MYRBETRIQ 50 MG TB24 tablet Take 50 mg by mouth daily. 01/21/21   [provider]  nystatin ointment (MYCOSTATIN) Apply topically 2 (two) times daily. 03/07/22   Ghimire, Werner Lean, MD  potassium chloride (KLOR-CON) 10 MEQ tablet TAKE 1 TABLET BY MOUTH EVERY DAY 01/14/21   Allred, Fayrene Fearing, MD  sertraline (ZOLOFT) 100 MG tablet Take 100 mg by mouth every morning. 02/04/17   [provider]  telmisartan (MICARDIS) 80 MG tablet Take 80 mg by mouth daily.    [provider]  vitamin B-12 (CYANOCOBALAMIN) 1000 MCG tablet Take 1 tablet (  1,000 mcg total) by mouth every morning. 03/07/22   Ghimire, Werner Lean, MD  VITAMIN E PO Take 1 capsule by mouth once a week.    [provider]    Physical Exam: Vitals:   04/29/22 2203 04/30/22 0017 04/30/22 0042 04/30/22 0100  BP: (!) 155/84 (!) 162/92 (!) 145/76 (!) 138/93  Pulse: 92 90 97 97  Resp: 17 18 (!) 24 (!) 21  Temp:  99.4 F (37.4 C) 98.2 F (36.8 C)   TempSrc:  Oral Oral   SpO2: 97% 98% 93% 93%  Weight:      Height:       Constitutional: Sleeping at time of my exam. Eyes: PERRL, lids and conjunctivae normal ENMT: Mucous membranes are moist. Posterior pharynx clear of any exudate or lesions.Normal dentition.  Neck: normal, supple, no masses, no thyromegaly Respiratory: clear to auscultation bilaterally, no wheezing, no crackles. Normal respiratory effort. No accessory muscle use.  Cardiovascular: irr, irr, no murmurs / rubs / gallops. No extremity edema. 2+ pedal pulses. No carotid bruits.  Abdomen: no tenderness, no masses palpated. No hepatosplenomegaly. Bowel sounds positive.  Musculoskeletal: no clubbing / cyanosis. No joint deformity upper and lower extremities. Good ROM, no contractures. Normal muscle tone.  Skin: BLE lymphedema, symmetric.  BLE erythema to tops of feet and shins, also symmetric, minimal TTP to BLE that pt says is baseline.  LUE edema to hand, no erythema, no TTP, no  crepitus. Neurologic: CN 2-12 grossly intact. Sensation intact, DTR normal. Strength 5/5 in all 4.  Psychiatric:  Pt is confused, waxing and waning mental status.  Visual hallucinations and agitated at times.  Data Reviewed:    EKG showing rate controlled AF  CBC    Component Value Date/Time   WBC 10.9 (H) 04/29/2022 2057   RBC 5.04 04/29/2022 2057   HGB 11.8 (L) 04/29/2022 2057   HGB WILL FOLLOW 12/21/2017 1504   HCT 38.1 04/29/2022 2057   HCT WILL FOLLOW 12/21/2017 1504   PLT 383 04/29/2022 2057   PLT WILL FOLLOW 12/21/2017 1504   MCV 75.6 (L) 04/29/2022 2057   MCV WILL FOLLOW 12/21/2017 1504   MCH 23.4 (L) 04/29/2022 2057   MCHC 31.0 04/29/2022 2057   RDW 17.3 (H) 04/29/2022 2057   RDW WILL FOLLOW 12/21/2017 1504   LYMPHSABS 1.6 04/29/2022 2057   LYMPHSABS WILL FOLLOW 12/21/2017 1504   MONOABS 1.3 (H) 04/29/2022 2057   EOSABS 0.0 04/29/2022 2057   EOSABS WILL FOLLOW 12/21/2017 1504   BASOSABS 0.1 04/29/2022 2057   BASOSABS WILL FOLLOW 12/21/2017 1504   CMP     Component Value Date/Time   NA 132 (L) 04/29/2022 2057   NA 140 12/21/2017 1504   K 3.9 04/29/2022 2057   CL 98 04/29/2022 2057   CO2 19 (L) 04/29/2022 2057   GLUCOSE 466 (H) 04/29/2022 2057   BUN 18 04/29/2022 2057   BUN 16 12/21/2017 1504   CREATININE 1.11 (H) 04/29/2022 2057   CREATININE 0.85 07/30/2016 1115   CALCIUM 9.2 04/29/2022 2057   PROT 7.0 03/05/2022 2153   ALBUMIN 3.1 (L) 03/05/2022 2153   AST 26 03/05/2022 2153   ALT 18 03/05/2022 2153   ALKPHOS 85 03/05/2022 2153   BILITOT 1.2 03/05/2022 2153   GFRNONAA 51 (L) 04/29/2022 2057   GFRAA >60 07/26/2019 0311   Urinalysis    Component Value Date/Time   COLORURINE YELLOW 04/29/2022 2244   APPEARANCEUR CLOUDY (A) 04/29/2022 2244   LABSPEC 1.028 04/29/2022 2244  PHURINE 5.0 04/29/2022 2244   GLUCOSEU >=500 (A) 04/29/2022 2244   HGBUR SMALL (A) 04/29/2022 2244   BILIRUBINUR NEGATIVE 04/29/2022 2244   KETONESUR 20 (A) 04/29/2022 2244    PROTEINUR 100 (A) 04/29/2022 2244   NITRITE NEGATIVE 04/29/2022 2244   LEUKOCYTESUR MODERATE (A) 04/29/2022 2244    Drugs of Abuse     Component Value Date/Time   LABOPIA NONE DETECTED 04/29/2022 2244   COCAINSCRNUR NONE DETECTED 04/29/2022 2244   LABBENZ NONE DETECTED 04/29/2022 2244   AMPHETMU NONE DETECTED 04/29/2022 2244   THCU NONE DETECTED 04/29/2022 2244   LABBARB NONE DETECTED 04/29/2022 2244    INR 1.4  CXR: IMPRESSION: Unchanged cardiomegaly. Slight increased interstitial thickening may represent pulmonary edema.  CT head: IMPRESSION: 1. No acute intracranial pathology. 2. Age-related atrophy and chronic microvascular ischemic changes.  Assessment and Plan: * Encephalopathy acute Consistent with delirium presumably secondary to UTI.  H/o same multiple times in past per family (and chart too). Most recently admitted end of June with delirium secondary to cellulitis.  Similar visual hallucinations described on DC summary. Treat UTI as below. Haldol PRN agitation Tele monitor  Recurrent UTI UA today c/w UTI Empiric rocephin for the moment UCx ordered Mostly sensitive K.Pneumo on UA in May 2023  Swelling of right hand Possibly just due to coban wrapping around her elbow IV site? No erythema, no TTP.  Doesn't seem to be infected at this point. Consider imaging if this persists  Uncontrolled type 2 diabetes mellitus with hyperglycemia (HCC) BGL 509 in ED Starting insulin drip per protocol. Repeat BMP in AM Letting patient eat for the moment (not ideal while on insulin drip and BGLs elevated, but feel this is needed to try and pacify patient who is very unhappy at the moment).  Lower extremity edema History of same, lymphedema. Erythema, chronic pain, and degree of lymphedema are all baseline per patient. Today looks very similar to the 8/4 and 7/5 photos in the media tab of her chart.  Maybe a little bit more erythema BLE today, but this seems to be symmetric  and may just be due to lighting / camera image.  Essential hypertension Continue home BP meds  Persistent atrial fibrillation (HCC) Continue eliquis Continue cardizem Not sure if still on metoprolol or not, med rec pending at this time.      Advance Care Planning:   Code Status: Full Code  Consults: None  Family Communication: No family in room  Severity of Illness: The appropriate patient status for this patient is OBSERVATION. Observation status is judged to be reasonable and necessary in order to provide the required intensity of service to ensure the patient's safety. The patient's presenting symptoms, physical exam findings, and initial radiographic and laboratory data in the context of their medical condition is felt to place them at decreased risk for further clinical deterioration. Furthermore, it is anticipated that the patient will be medically stable for discharge from the hospital within 2 midnights of admission.   Author: Hillary Bow., DO 04/30/2022 2:19 AM  For on call review [email or web address].

## 2022-04-30 NOTE — Assessment & Plan Note (Signed)
Continue home BP meds. 

## 2022-04-30 NOTE — ED Notes (Signed)
Attempted again to call report via phone. No success

## 2022-04-30 NOTE — ED Notes (Signed)
Initiated report via secure chat 15 mins ago, awaiting response. Attempted to call report over phone and no answer from receiving nurse. Will try again

## 2022-04-30 NOTE — ED Notes (Signed)
This nurse went to flush IV to LAC, unable to flush. IV infiltrated and D/C, insulin drip paused until able to obtain second IV access

## 2022-04-30 NOTE — ED Notes (Signed)
Pt removed pulse ox, monitoring cords and wrapping on IV. New wrapping on IV placed w/ arm board, 20g placed in R hand. Pt assisted w/ bedpan, pt confused and grabbing at everything including staff. Pt is very confused at this time.

## 2022-04-30 NOTE — ED Notes (Signed)
MD Julian Reil made aware of CBG 509.

## 2022-04-30 NOTE — ED Notes (Signed)
Posey alarm placed under pt.

## 2022-04-30 NOTE — ED Notes (Signed)
Patient is hollering loudly, not wanting to stop when asked. Attempting to get out of bed  Patient noted throughout this getting SOB due to exertion.   PRN haldol administered.

## 2022-04-30 NOTE — Progress Notes (Addendum)
Inpatient Diabetes Program Recommendations  AACE/ADA: New Consensus Statement on Inpatient Glycemic Control (2015)  Target Ranges:  Prepandial:   less than 140 mg/dL      Peak postprandial:   less than 180 mg/dL (1-2 hours)      Critically ill patients:  140 - 180 mg/dL   Lab Results  Component Value Date   GLUCAP 164 (H) 04/30/2022   HGBA1C 8.8 (H) 03/06/2022    Review of Glycemic Control  Latest Reference Range & Units 04/29/22 22:53 04/30/22 01:52 04/30/22 03:28 04/30/22 03:55 04/30/22 04:03  Glucose-Capillary 70 - 99 mg/dL 622 (H) 297 (H) 989 (H) 180 (H) 164 (H)   Diabetes history:  DM2 Outpatient Diabetes medications:  Glucotrol 10 mg q AM Lantus 55 units daily Metformin 1000 mg bid Mounjaro 2.5 mg weekly Current orders for Inpatient glycemic control:  IV insulin Inpatient Diabetes Program Recommendations:    Note patient admitted with acute encephalopathy and UTI.   Most recent blood sugar was 164 mg/dL and insulin drip rate 2.6 units/ hr. Looks like she may be ready to transition off insulin drip.  Consider adding Semglee 28 units 2 hours prior to d/c of insulin drip and consider Novolog sensitive q 4 hours?  Will follow and speak to patient when appropriate and not confused.   Thanks,  Beryl Meager, RN, BC-ADM Inpatient Diabetes Coordinator Pager [phone]  (8a-5p)

## 2022-04-30 NOTE — ED Notes (Signed)
Pt was calling out from her room. This RN went to check on pt. Pt is irritable and spoke to staff in a demeaning, condescending manner. Pt is verbally aggressive. She is Ox3, disoriented to situation. Explained to pt that I had to round on my other pts and then would be back to see her. Pt stated, "That isn't necessary". Informed pt that I would be back.

## 2022-04-30 NOTE — Assessment & Plan Note (Addendum)
Continue eliquis Continue cardizem Not sure if still on metoprolol or not, med rec pending at this time.

## 2022-04-30 NOTE — ED Notes (Signed)
Lab called to add on urine culture.  ?

## 2022-04-30 NOTE — ED Notes (Signed)
Patient found removing everything off herself including IV and PurWick. Patient attempting to get out of bed once again. MD made aware.   Throughout getting patient back into bed and placing patient back onto monitor, patient is hollering and stating insults with curse words to staff.

## 2022-04-30 NOTE — Assessment & Plan Note (Addendum)
BGL 509 in ED 1. Starting insulin drip per protocol. 2. Repeat BMP in AM 3. Letting patient eat for the moment (not ideal while on insulin drip and BGLs elevated, but feel this is needed to try and pacify patient who is very unhappy at the moment).

## 2022-05-01 DIAGNOSIS — N390 Urinary tract infection, site not specified: Secondary | ICD-10-CM | POA: Diagnosis not present

## 2022-05-01 DIAGNOSIS — G9340 Encephalopathy, unspecified: Secondary | ICD-10-CM | POA: Diagnosis not present

## 2022-05-01 LAB — BASIC METABOLIC PANEL
Anion gap: 12 (ref 5–15)
BUN: 18 mg/dL (ref 8–23)
CO2: 20 mmol/L — ABNORMAL LOW (ref 22–32)
Calcium: 8.7 mg/dL — ABNORMAL LOW (ref 8.9–10.3)
Chloride: 102 mmol/L (ref 98–111)
Creatinine, Ser: 0.99 mg/dL (ref 0.44–1.00)
GFR, Estimated: 58 mL/min — ABNORMAL LOW (ref >60)
Glucose, Bld: 230 mg/dL — ABNORMAL HIGH (ref 70–99)
Potassium: 3.7 mmol/L (ref 3.5–5.1)
Sodium: 134 mmol/L — ABNORMAL LOW (ref 135–145)

## 2022-05-01 LAB — GLUCOSE, CAPILLARY
Glucose-Capillary: 172 mg/dL — ABNORMAL HIGH (ref 70–99)
Glucose-Capillary: 220 mg/dL — ABNORMAL HIGH (ref 70–99)
Glucose-Capillary: 225 mg/dL — ABNORMAL HIGH (ref 70–99)
Glucose-Capillary: 228 mg/dL — ABNORMAL HIGH (ref 70–99)
Glucose-Capillary: 250 mg/dL — ABNORMAL HIGH (ref 70–99)
Glucose-Capillary: 234 mg/dL — ABNORMAL HIGH (ref 70–99)
Glucose-Capillary: 216 mg/dL — ABNORMAL HIGH (ref 70–99)

## 2022-05-01 MED ORDER — FUROSEMIDE 10 MG/ML IJ SOLN
40.0000 mg | Freq: Once | INTRAMUSCULAR | Status: AC
  Administered 2022-05-01: 40 mg via INTRAVENOUS
  Filled 2022-05-01: qty 4

## 2022-05-01 MED ORDER — METOPROLOL SUCCINATE ER 50 MG PO TB24
50.0000 mg | ORAL_TABLET | Freq: Every day | ORAL | Status: DC
  Administered 2022-05-01: 50 mg via ORAL
  Filled 2022-05-01: qty 1

## 2022-05-01 MED ORDER — SERTRALINE HCL 100 MG PO TABS
100.0000 mg | ORAL_TABLET | Freq: Every morning | ORAL | Status: DC
  Administered 2022-05-01 – 2022-05-05 (×5): 100 mg via ORAL
  Filled 2022-05-01 (×5): qty 1

## 2022-05-01 MED ORDER — IRBESARTAN 150 MG PO TABS
150.0000 mg | ORAL_TABLET | Freq: Every day | ORAL | Status: DC
  Administered 2022-05-01 – 2022-05-03 (×3): 150 mg via ORAL
  Filled 2022-05-01 (×3): qty 1

## 2022-05-01 MED ORDER — POTASSIUM CHLORIDE CRYS ER 20 MEQ PO TBCR
40.0000 meq | EXTENDED_RELEASE_TABLET | Freq: Once | ORAL | Status: AC
  Administered 2022-05-01: 40 meq via ORAL
  Filled 2022-05-01: qty 2

## 2022-05-01 NOTE — Inpatient Diabetes Management (Signed)
Inpatient Diabetes Program Recommendations  AACE/ADA: New Consensus Statement on Inpatient Glycemic Control (2015)  Target Ranges:  Prepandial:   less than 140 mg/dL      Peak postprandial:   less than 180 mg/dL (1-2 hours)      Critically ill patients:  140 - 180 mg/dL   Lab Results  Component Value Date   GLUCAP 250 (H) 05/01/2022   HGBA1C 8.8 (H) 03/06/2022    Review of Glycemic Control  Latest Reference Range & Units 04/30/22 17:42 04/30/22 20:56 04/30/22 23:23  Glucose-Capillary 70 - 99 mg/dL 960 (H) 454 (H) 098 (H)  (H): Data is abnormally high  Diabetes history:  DM2 Outpatient Diabetes medications:  Glucotrol 10 mg q AM Lantus 55 units daily Metformin 1000 mg bid Mounjaro 2.5 mg weekly Current orders for Inpatient glycemic control:  Semglee 28 units qd Novolog 0-9 units q 4 hrs.  Inpatient Diabetes Program Recommendations:   Please consider: -Increase in Semglee to 35 units qd  Thank you, Billy Fischer. Ymani Porcher, RN, MSN, CDE  Diabetes Coordinator Inpatient Glycemic Control Team Team Pager [phone] (8am-5pm) 05/01/2022 12:24 PM

## 2022-05-01 NOTE — TOC Initial Note (Signed)
Transition of Care [facility]) - Initial/Assessment Note    Patient Details  Name: Amy Wright MRN: [id number] Date of Birth: 1941/09/02  [facility]) CM/SW Contact:    Pollie Friar, RN Phone Number: 05/01/2022, 1:31 PM  Clinical Narrative:                 Patient is from home with her spouse that works during the daytime. She has been managing her own medications. Cm inquired about someone over seeing her meds at home and she says she isnt old enough for that as she hasnt reached 82 yet.  CM met with the patient alone initially then one of her daughters visited. Patient very very distracted and could not stay on topic. When asked about her spouse being home with her she said he was with her at home but when pressed she says he works. When asked about rehab she would jump and talk about her daughter's contacts or other topics and CM kept having to try and get her to refocus.  CM left with her and her daughter at the bedside agreeing to start rehab search. CM has also updated daughter, Horris Latino. CM is to meet with spouse when he visits.   Expected Discharge Plan: Skilled Nursing Facility Barriers to Discharge: Continued Medical Work up   Patient Goals and CMS Choice   CMS Medicare.gov Compare Post Acute Care list provided to:: Patient Choice offered to / list presented to : Patient, Adult Children  Expected Discharge Plan and Services Expected Discharge Plan: [place] In-house Referral: Clinical Social Work Discharge Planning Services: CM Consult Post Acute Care Choice: [place] arrangements for the past 2 months: [place]                                      Prior Living Arrangements/Services Living arrangements for the past 2 months: Single Family Home Lives with:: Spouse Patient language and need for interpreter reviewed:: Yes Do you feel safe going back to the place where you live?: Yes      Need for Family  Participation in Patient Care: Yes (Comment) Care giver support system in place?: No (comment) Current home services: DME Criminal Activity/Legal Involvement Pertinent to Current Situation/Hospitalization: No - Comment as needed  Activities of Daily Living      Permission Sought/Granted                  Emotional Assessment Appearance:: Appears stated age Attitude/Demeanor/Rapport: Inconsistent Affect (typically observed): In denial, Anxious Orientation: : Oriented to Self, Oriented to Place   Psych Involvement: Yes (comment)  Admission diagnosis:  Encephalopathy acute [G93.40] Acute encephalopathy [G93.40] Urinary tract infection with hematuria, site unspecified [N39.0, R31.9] Altered mental status, unspecified altered mental status type [R41.82] Patient Active Problem List   Diagnosis Date Noted   Swelling of right hand 04/30/2022   Acute encephalopathy 04/30/2022   Lower extremity edema 03/06/2022   BMI 37.0-37.9, adult 03/06/2022   Candidiasis of skin 03/06/2022   Encephalopathy acute 03/06/2022   AMS (altered mental status) 03/05/2022   Coagulopathy ([place]) 01/29/2021   Acute blood loss anemia 07/27/2019   Left-sided epistaxis    Long term current use of anticoagulant therapy    Atrial fibrillation with RVR ([place]) 07/25/2019   Breast pain, left 12/03/2016   Hx of long term use of blood thinners 04/01/2016   Uncontrolled type 2  diabetes mellitus with hyperglycemia ([place]) 11/26/2015   Microscopic hematuria 09/19/2015   Left foot pain 08/22/2015   Obesity 02/15/2015   Frequency of urination 06/13/2014   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   Depression 03/22/2014   Insomnia 03/22/2014   Vaginitis 03/22/2014   Hyperlipemia 03/22/2014   Hypertriglyceridemia 03/22/2014   Atrial fibrillation ([place]) 08/09/2013   Obstructive sleep apnea syndrome 08/07/2013   Persistent atrial fibrillation ([place]) 03/29/2013   Anticoagulated on Coumadin 03/29/2013   DM2 (diabetes mellitus, type 2)  ([place]) 03/29/2013   Mixed hyperlipidemia 03/29/2013   Essential hypertension 03/29/2013   PCP:  Robyne Peers, MD Pharmacy:   [id number] [place] [address] [place]Phone: [phone]: [phone] [facility] [address]Phone: [phone]: [phone]    Social Determinants of Health (SDOH) Interventions    Readmission Risk Interventions     No data to display

## 2022-05-01 NOTE — Progress Notes (Signed)
Pt agitated pulling off telemetry and other devices. Pt not easily redirectable. Dr, Leafy Half confirmed that it is ok to give pt her prn haldol. MD comfortable w/ previous EKG. No QTc gathered d/t pt being in Afib per telemetry.

## 2022-05-01 NOTE — Evaluation (Signed)
Physical Therapy Evaluation  Patient Details Name: Amy Wright MRN: [id number] DOB: [birth year] Today's Date: 05/01/2022  History of Present Illness  Pt is a 81 y/o female who presents with AMS on 04/29/22. Pt found to have UTI and delirium. Pt with recent admission for the same in 02/2022. PMH significant for BLE edema, HTN, a-fib, DM II, non-compliance with meds.   Clinical Impression  Pt admitted with above diagnosis. Pt currently with functional limitations due to the deficits listed below (see PT Problem List). At the time of PT eval pt confused and refusing mobility with PT/OT. Pt demonstrated the ability to flex knees minimally in the bed but complaining of increased pain due to reported lymphedema. Pt was repositioned in bed to eat breakfast. Recommending SNF level rehab at this time to maximize functional independence and safety prior to return home with family support. Will continue to update recommendations as appropriate if/when pt agreeable to mobility and we are able to assess her further.       Recommendations for follow up therapy are one component of a multi-disciplinary discharge planning process, led by the attending physician.  Recommendations may be updated based on patient status, additional functional criteria and insurance authorization.  Follow Up Recommendations Skilled nursing-short term rehab (<3 hours/day) Can patient physically be transported by private vehicle: No    Assistance Recommended at Discharge Frequent or constant Supervision/Assistance  Patient can return home with the following  Two people to help with walking and/or transfers;Two people to help with bathing/dressing/bathroom;Assistance with cooking/housework;Direct supervision/assist for medications management;Direct supervision/assist for financial management;Assist for transportation;Help with stairs or ramp for entrance    Equipment Recommendations Other (comment) (TBD by next venue of care)   Recommendations for Other Services       Functional Status Assessment Patient has had a recent decline in their functional status and demonstrates the ability to make significant improvements in function in a reasonable and predictable amount of time.     Precautions / Restrictions Precautions Precautions: Fall Restrictions Weight Bearing Restrictions: No      Mobility  Bed Mobility               General bed mobility comments: Minimal active movement of the LE's noted in bed - pt unwilling to attempt sitting EOB.    Transfers                        Ambulation/Gait                  Stairs            Wheelchair Mobility    Modified Rankin (Stroke Patients Only)       Balance       Sitting balance - Comments: Unable to assess                                     Pertinent Vitals/Pain Pain Assessment Pain Assessment: Faces Faces Pain Scale: No hurt Pain Location: Pt does not appear to be in any pain however does not want to do anything because of reported pain in LE's. Pain Descriptors / Indicators: Guarding Pain Intervention(s): Limited activity within patient's tolerance, Monitored during session, Repositioned    Home Living Family/patient expects to be discharged to:: Private residence Living Arrangements: Spouse/significant other Available Help at Discharge: Family;Available PRN/intermittently;Available 24 hours/day Type of Home: House Home Access: Stairs to  enter Entrance Stairs-Rails: None Entrance Stairs-Number of Steps: 2-3   Home Layout: One level Home Equipment: Cane - single point;Shower seat;Adaptive equipment;Rolling Walker (2 wheels);Grab bars - tub/shower;Grab bars - toilet;Hand held shower head      Prior Function Prior Level of Function : Needs assist             Mobility Comments: Used RW around house, but reports used cane more ADLs Comments: Pt reports was independent prior to arrival,  but per chart, was receiving help with bathing and dressing in June this year     Hand Dominance   Dominant Hand: Right    Extremity/Trunk Assessment   Upper Extremity Assessment Upper Extremity Assessment: Generalized weakness (Grossly 4-/5)    Lower Extremity Assessment Lower Extremity Assessment: Defer to PT evaluation RLE Deficits / Details: Grossly 2/5 LLE Deficits / Details: Grossly 2/5    Cervical / Trunk Assessment Cervical / Trunk Assessment:  (Unable to assess as pt refusing EOB and OOB.)  Communication   Communication: No difficulties  Cognition Arousal/Alertness: Awake/alert Behavior During Therapy: WFL for tasks assessed/performed Overall Cognitive Status: Impaired/Different from baseline Area of Impairment: Memory, Following commands, Safety/judgement, Awareness, Problem solving, Attention                   Current Attention Level: Sustained Memory: Decreased short-term memory Following Commands: Follows one step commands inconsistently, Follows one step commands with increased time Safety/Judgement: Decreased awareness of safety Awareness: Intellectual Problem Solving: Slow processing, Requires verbal cues, Decreased initiation General Comments: Pt with difficulty recalling accurate details of coversation with the MD. Pt reporting to daughter on the phone she is on strict bed rest, and refusing to attempt mobility with PT stating the MD told her to stay in bed until tomorrow due to her lymphadema and heart problems. Pt referencing as if it were nighttime throughout the session when it was before noon.        General Comments      Exercises     Assessment/Plan    PT Assessment Patient needs continued PT services  PT Problem List Decreased strength;Decreased range of motion;Decreased activity tolerance;Decreased balance;Decreased mobility;Decreased cognition;Decreased knowledge of use of DME;Decreased safety awareness;Decreased knowledge of  precautions;Pain       PT Treatment Interventions DME instruction;Gait training;Stair training;Functional mobility training;Therapeutic activities;Therapeutic exercise;Balance training;Patient/family education;Cognitive remediation    PT Goals (Current goals can be found in the Care Plan section)  Acute Rehab PT Goals Patient Stated Goal: Not move out of bed until tomorrow PT Goal Formulation: Patient unable to participate in goal setting Time For Goal Achievement: 05/15/22 Potential to Achieve Goals: Good    Frequency Min 2X/week     Co-evaluation               AM-PAC PT "6 Clicks" Mobility  Outcome Measure Help needed turning from your back to your side while in a flat bed without using bedrails?: Total Help needed moving from lying on your back to sitting on the side of a flat bed without using bedrails?: Total Help needed moving to and from a bed to a chair (including a wheelchair)?: Total Help needed standing up from a chair using your arms (e.g., wheelchair or bedside chair)?: Total Help needed to walk in hospital room?: Total Help needed climbing 3-5 steps with a railing? : Total 6 Click Score: 6    End of Session   Activity Tolerance: Other (comment) (Limited by cognition) Patient left: in bed;with call bell/phone within reach;with  bed alarm set Nurse Communication: Mobility status PT Visit Diagnosis: Pain;History of falling (Z91.81);Other (comment) (AMS) Pain - part of body:  (legs)    Time: 1032-1100 PT Time Calculation (min) (ACUTE ONLY): 28 min   Charges:   PT Evaluation $PT Eval Moderate Complexity: 1 Mod          Conni Slipper, PT, DPT Acute Rehabilitation Services Secure Chat Preferred Office: [phone]   Marylynn Pearson 05/01/2022, 2:07 PM

## 2022-05-01 NOTE — Consult Note (Addendum)
[facility] New Face-to-Face Psychiatric Evaluation   Service Date: May 01, 2022 LOS:  LOS: 1 day    Assessment  Amy Wright is a 81 y.o. female admitted medically on 04/29/2022  7:03 PM for AMS and UTI. She carries the psychiatric diagnoses of anxiety and depression and has a past medical history of  afib on coumadin. Psychiatry was consulted per request of the duaghter is interested in having psych see her again as she is back with the same issues but not medication induced this time. She says she is resistant to help for mental issues by Dr. Waldron Labs.   Hallucinations, likely due to medication polypharmacy Patient notably does not meet criteria for mood, substance use, or psychotic disorder.  No acute safety concerns or need for inpatient psychiatric hospitalization.  The patient has been experiencing hallucinations during the day in the context of urinary tract infection vs mild cognitive impairment.  Do not feel there is a role for antipsychotic management in these hallucinations at this time.  The next best step forward is referral to neuropsychiatry or neurology for evaluation of MCI.  Diagnoses:  Active Hospital problems: Principal Problem:   Encephalopathy acute Active Problems:   Persistent atrial fibrillation (HCC)   Essential hypertension   Recurrent UTI   Uncontrolled type 2 diabetes mellitus with hyperglycemia (HCC)   Lower extremity edema   Swelling of right hand   Acute encephalopathy     Plan  ## Safety and Observation Level:  - Based on my clinical evaluation, I estimate the patient to be at low risk of self harm in the current setting - At this time, we recommend a routine level of observation. This decision is based on my review of the chart including patient's history and current presentation, interview of the patient, mental status examination, and consideration of suicide risk including evaluating suicidal ideation, plan, intent, suicidal  or self-harm behaviors, risk factors, and protective factors. This judgment is based on our ability to directly address suicide risk, implement suicide prevention strategies and develop a safety plan while the patient is in the clinical setting. Please contact our team if there is a concern that risk level has changed.  ## Medications:  --Continue medications at present dosages with the exception of the following medications which should be discontinued.   -Discontinue mirtazapine  ## Medical Decision Making Capacity:  Not formally assessed  ## Further Work-up:  -- most recent EKG on 8/22 had QtC of 396 -- Pertinent labwork reviewed earlier this admission includes: UDS negative -U/A positive for moderate leukocytes and proteinuria.   ## Disposition:  --TBD, no need for inpatient psychiatry -Referral for MCI evaluation.  -TOC-home health referral and medication management in the home. -Unfortunately patient does lack some insight into worsening cognitive impairment, and is refusing any additional care and services.  -Initiate delirium precautions  ##Legal Status VOLUNTARY  Thank you for this consult request. Recommendations have been communicated to the primary team.  We will sign off at this time.   Suella Broad, FNP   NEW history  Relevant Aspects of Hospital Course:  Admitted on 04/29/2022 for altered mental status. Improving on 05/01/22.   Patient Report:  Spoke to pt in AM around 1:30. Youngest Daughter in room - pt consents to have her remain "depending on what this about, we can decide if she stays or not. " . Has 3 daughters. Oldest daughter apparently lives in [place] and is over the affairs.    Patient  admits to decline in mental health " Im almost 81 years old, I would expect to have a decline." She however does not agree that she is hallucinating or confused, despite extensive documentation in the chart. She denies being unclothed and crawling in the floor. She  denies removing her EKG leads because a man told her to do, although during this evaluation she is observed to be removing her EKG leads in the same manner. She removed her leads and put them back on about 10x during this evaluation. SHe states she no longer takes mirtazapine or bupropion "they dont sound familiar". Discussed with patient these medications were filled according to her outside records.  Oriented to self, time, and place. Oriented to year, knows president "Biden, I bet he will forget like me too. "and struggles naming prior presidents but states it is Clinton "maybe it was a person in between clinton but I cant remember. " Fairly concrete. Able to follow simple commands - Endorses intermittent compliance with meds.    She reports taking Sertraline a few times a week. She declines taking all other medications, except "medicine that starts with a D from [facility]". It was determined this medication was to be the generic Unisom (doxylamine). She does mention no longer being on Ambien. Patient states she has not slept in 4 days, and that is one of the reasons she came to the hospital.    She describes her overall mood as" pretty good".  She seems to struggle with feelings of guilt, related to changes.  She does not wish to talk about her confusion, poor memory, worsening cognitive impairment.  She reports her noncompliance to medication is not noncompliance, " I just choose not to take the medication.  If I take it 1 time and it does not work, stopped taking it."  Patient further denies any assistance with medication management at home, nor does she appear to be open to any additional home health services. She does not wish to have this discussion with her PCP about these changes, "if she thinks it is necessary she will start me on medication. It is not a problem yet. Im almost 81 years old. I would expect this to be happening. "     Daughter who is present makes multiple attempts throughout interview,  to encourage patient to participate.  Patient seems to believe that her age-related changes, are age appropriate.  She does not consider it to be a problem, despite what others may think.  Patient is very dismissive when discussing her age-related changes and mental health.  She does not appear to be open to discuss any further.  She initially denied this Clinical research associate consent to speak with her daughter Britta Mccreedy who has power of attorney.   Daughter is able to get mother, to agree to allow me to speak with her other daughter to keep everyone on the same page.   DEnies any recent substance use or alcohol use. Denies history of alcohol use.   No major psych hx, 1 marriage counselor visit lifetime    No family hx mental illness, father drank socially. One brother died under suspicious circumstances (pt states stumbled on the porch with a gun, daughter states he comitted suicide)   The following discussion is had with patient and daughter(s) Meriam Sprague and [place]. Reviewed the following. [name] describes her mothers conditions as a flickering light bulb, " You wiggle it light comes back on, but since June she has not gotten better. I think her light  bulb is out. I also think we maybe past mild dementia. I have never send my mother like this." She endorses concerns of worsening cognitive impairment, combativeness, and paranoia, [name] is reminded that she is being treated for a UTI which can alter her current mental state.   Mild and major neurocognitive disorder is evidenced by profound cognitive decline in one or more areas including, but not limited to, memory, attention, language, and executive function. Cognitive decline should be supported by clinical assessments and neuropsychological testing, and the decline should represent impairment in one's ability to perform activities of daily living. Memory impairment is a prominent early symptom and is required to make a diagnosis of dementia. Another symptom is a  decreased ability to learn new information and/or forgetting previously learned material. Deterioration in language function (aphasia) is often seen when the patient has difficulty in providing names of items and people. Late stages of dementia are often characterized by echolalia (echoing what is heard).  Currently patient presents with no known behavioral disturbances, however she has been increasingly having hallucinations, psychosis, confusion, disorientation. While dementia is well recognized as a disorder that progressively deteriorates, the associated behavioral disturbances may unnecessarily hasten a patient's loss of quality of life as well as lead to inpatient hospitalization and institutionalization (placement in a long-term care facility). These behavioral manifestations in dementia include agitation, aggression, psychosis, and purposeless wandering. Delusions and disruptive behaviors including aggression and screaming are reported to be among the most disturbing to caregivers. Often these behaviors worsen at night and have been termed sundowning, linking the worsening behaviors with the time the sun sets in the evening. Disinhibition also can be seen in this population, which can be worsened by drug therapy such as antihistamines and benzodiazepines. For patients experiencing early signs of dementia, mood changes and increased anxiety may actually be more prominent than memory loss, so healthcare providers and family members should be alerted to report and document such changes.   Will encourage family to pursue further evaluation by a neuropsychiatrist or neurologist, who can diagnose, treat, slow progression of cognitive impairment/dementia.  The above information and processes, is best to determine in an outpatient setting.  Also discussed with daughter the need for possible long-term care facility, assisted living, and or memory care unit where persons are better equipped to manage the  progression of this disease.  Patient does appear to be comfortable with her primary care provider, and may need to hear it from the outpatient primary provider prior to referral being initiated suspects she may be more receptive to her established provider then from someone within the Hospital system.   ROS:  As above  Collateral information:  From daughter in the room  Psychiatric History:  Information collected from patient, family, chart review  Family psych history: as above   Social History:  As above  Family History:  The patient's family history includes Cancer - Lung in her father; Cancer - Other in her maternal grandmother; Cardiomyopathy in her mother; Diabetes in her maternal grandfather; Heart attack in her mother; Stroke in her paternal grandfather.  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 Central jet. Aortic Valve appears to be mildly sclerotic. Trace aortic regurgitation.EF >55%    Surgical History: Past Surgical History:  Procedure Laterality Date   APPENDECTOMY  1971   CARDIOVERSION N/A 06/23/2013   Procedure: CARDIOVERSION;  Surgeon: Troy Sine, MD;  Location: [facility];  Service: Cardiovascular;  Laterality: N/A;   CATARACT EXTRACTION W/ INTRAOCULAR LENS IMPLANT Bilateral    CESAREAN SECTION  1971   PLACEMENT OF BREAST IMPLANTS Bilateral 1985    Medications:   Current Facility-Administered Medications:    acetaminophen (TYLENOL) tablet 1,000 mg, 1,000 mg, Oral, Q6H PRN, Elgergawy, Silver Huguenin, MD   apixaban (ELIQUIS) tablet 5 mg, 5 mg, Oral, BID, Alcario Drought, Jared M, DO, 5 mg at 05/01/22 2033   cefTRIAXone  (ROCEPHIN) 1 g in sodium chloride 0.9 % 100 mL IVPB, 1 g, Intravenous, Q24H, Alcario Drought, Jared M, DO, Last Rate: 200 mL/hr at 04/30/22 2311, 1 g at 04/30/22 2311   cholecalciferol (VITAMIN D3) 25 MCG (1000 UNIT) tablet 1,000 Units, 1,000 Units, Oral, Daily, Elgergawy, Silver Huguenin, MD, 1,000 Units at 05/01/22 1024   dextrose 50 % solution 0-50 mL, 0-50 mL, Intravenous, PRN, Alcario Drought, Jared M, DO   diltiazem (CARDIZEM CD) 24 hr capsule 360 mg, 360 mg, Oral, Daily, Alcario Drought, Jared M, DO, 360 mg at 05/01/22 1024   ezetimibe (ZETIA) tablet 10 mg, 10 mg, Oral, QHS, Elgergawy, Silver Huguenin, MD, 10 mg at [id number]   folic acid (FOLVITE) tablet 1 mg, 1 mg, Oral, q morning, Elgergawy, Silver Huguenin, MD, 1 mg at 05/01/22 1024   furosemide (LASIX) tablet 40 mg, 40 mg, Oral, Daily, Elgergawy, Silver Huguenin, MD, 40 mg at 05/01/22 1025   haloperidol lactate (HALDOL) injection 2-4 mg, 2-4 mg, Intravenous, Q6H PRN, Etta Quill, DO, 2 mg at 04/30/22 0649   insulin aspart (novoLOG) injection 0-9 Units, 0-9 Units, Subcutaneous, Q4H, Elgergawy, Silver Huguenin, MD, 3 Units at 05/01/22 2033   insulin glargine-yfgn (SEMGLEE) injection 28 Units, 28 Units, Subcutaneous, Daily, Elgergawy, Silver Huguenin, MD, 28 Units at 05/01/22 1025   irbesartan (AVAPRO) tablet 150 mg, 150 mg, Oral, Daily, Elgergawy, Silver Huguenin, MD, 150 mg at 05/01/22 1246   metoprolol succinate (TOPROL-XL) 24 hr tablet 50 mg, 50 mg, Oral, Daily, Elgergawy, Silver Huguenin, MD, 50 mg at 05/01/22 1025   ondansetron (ZOFRAN) tablet 4 mg, 4 mg, Oral, Q6H PRN **OR** ondansetron (ZOFRAN) injection 4 mg, 4 mg, Intravenous, Q6H PRN, Alcario Drought, Jared M, DO   sertraline (ZOLOFT) tablet 100 mg, 100 mg, Oral, q morning, Elgergawy, Silver Huguenin, MD, 100 mg at 05/01/22 1024  Allergies: Allergies  Allergen Reactions   Rosuvastatin Calcium Other (See Comments)    myalgias    Statins Other (See Comments)    myalgia   Amoxicillin Rash   Atorvastatin Other (See Comments)    Back pain     Ezetimibe-Simvastatin Other (See Comments)    Back pain    Penicillins Itching, Swelling and Rash    Did it involve swelling of the face/tongue/throat, SOB, or low BP? No Did it involve sudden or severe rash/hives, skin peeling, or any reaction on the inside of your mouth or nose? Yes Did you need to seek medical attention at a hospital or doctor's office? No When did it last happen?   young adult    If all above answers are "NO", may proceed with cephalosporin use.       Objective  Vital signs:  Temp:  [98 F (36.7  C)-99.3 F (37.4 C)] 98 F (36.7 C) (08/24 2025) Pulse Rate:  [78-101] 98 (08/24 2025) Resp:  [14-20] 18 (08/24 2025) BP: (144-174)/(73-107) 163/104 (08/24 2025) SpO2:  [94 %-98 %] 94 % (08/24 2025)  Psychiatric Specialty Exam:  Presentation  General Appearance: Casual Eye Contact:Good Speech:Clear and Coherent Speech Volume:Normal Handedness:No data recorded  Mood and Affect  Mood:Euthymic Affect:Congruent  Thought Process  Thought Processes:Linear Descriptions of Associations:Intact  Orientation:Full (Time, Place and Person)  Thought Content:Logical  History of Schizophrenia/Schizoaffective disorder:No data recorded Duration of Psychotic Symptoms:No data recorded Hallucinations:No data recorded  Ideas of Reference:None  Suicidal Thoughts:No data recorded  Homicidal Thoughts:No data recorded   Sensorium  Memory:Immediate Fair; Recent Fair; Remote Fair Judgment:Fair Insight:Fair  Executive Functions  Concentration:Good Attention Span:Good [place] of Knowledge:Good Language:Good  Psychomotor Activity  Psychomotor Activity:restless and fidgeting  Assets  Assets:Social Support; Catering manager  Sleep  Sleep:No data recorded   Physical Exam: Physical Exam Constitutional:      Appearance: the patient is not toxic-appearing.  Pulmonary:     Effort: Pulmonary effort is normal.  Neurological:     General:  No focal deficit present.     Mental Status: A&O x 3; She is not alert to situation.   Review of Systems  Respiratory:  Negative for shortness of breath.   Cardiovascular:  Negative for chest pain.  Gastrointestinal:  Negative for abdominal pain, constipation, diarrhea, nausea and vomiting.  Neurological:  Negative for headaches.   Blood pressure (!) 163/104, pulse 98, temperature 98 F (36.7 C), temperature source Oral, resp. rate 18, height 5\' 5"  (1.651 m), weight 118 kg, SpO2 94 %. Body mass index is 43.29 kg/m.  Sheran Fava, PMHNP-BC [facility]

## 2022-05-01 NOTE — Evaluation (Signed)
Occupational Therapy Evaluation Patient Details Name: Amy Wright MRN: [id number] DOB: April 20, 1941 Today's Date: 05/01/2022   History of Present Illness Pt is a 81 y/o female who presents with AMS on 04/29/22. Pt found to have UTI and delirium. Pt with recent admission for the same in 02/2022. PMH significant for BLE edema, HTN, a-fib, DM II, non-compliance with meds.   Clinical Impression   Pt in bed on arrival, and initially agreeable to session, but as session progressed, pt progressively and inconsistently agitated, refusing OOB; Thus, limiting evaluation. Due to refusal OOB, pt total A for LB ADL at this time. Performing grooming in bed with set-up this session, and maintaining BUE above head ~5 minutes while brushing hair. Pt with decreased attention, safety, awareness, strength (4-/5 BUE), and ability to participate in ADL. Pt adamant during session that MD asked her not to move from bed, but also reporting it was night time and that she had just spoken to MD this evening (although eval completed during morning hours). Recommend discharge to SNF to optimize safety and independence in ADL and IADL. Will continue to update discharge recommendations as appropriate with pt more agreeable to mobility.      Recommendations for follow up therapy are one component of a multi-disciplinary discharge planning process, led by the attending physician.  Recommendations may be updated based on patient status, additional functional criteria and insurance authorization.   Follow Up Recommendations  Skilled nursing-short term rehab (<3 hours/day)    Assistance Recommended at Discharge Frequent or constant Supervision/Assistance  Patient can return home with the following Two people to help with walking and/or transfers;Two people to help with bathing/dressing/bathroom;Assistance with cooking/housework;Assistance with feeding;Direct supervision/assist for medications management;Direct supervision/assist for  financial management;Assist for transportation;Help with stairs or ramp for entrance    Functional Status Assessment  Patient has had a recent decline in their functional status and/or demonstrates limited ability to make significant improvements in function in a reasonable and predictable amount of time  Equipment Recommendations  Other (comment) (defer)    Recommendations for Other Services       Precautions / Restrictions Precautions Precautions: Fall Restrictions Weight Bearing Restrictions: No      Mobility Bed Mobility               General bed mobility comments: Minimal active movement of the LE's noted in bed - pt unwilling to attempt sitting EOB.    Transfers                   General transfer comment: Pt refusing OOB      Balance       Sitting balance - Comments: Unable to assess                                   ADL either performed or assessed with clinical judgement   ADL Overall ADL's : Needs assistance/impaired Eating/Feeding: Set up;Bed level   Grooming: Set up;Bed level Grooming Details (indicate cue type and reason): Brushing hair during session Upper Body Bathing: Minimal assistance;Bed level   Lower Body Bathing: Total assistance;Bed level   Upper Body Dressing : Moderate assistance;Bed level   Lower Body Dressing: Total assistance;Bed level;+2 for physical assistance                 General ADL Comments: Pt refusing any EOB or OOB activity this date, so evaluation limited     Vision  Vision Assessment?: No apparent visual deficits     Perception     Praxis      Pertinent Vitals/Pain Pain Assessment Faces Pain Scale: No hurt Pain Location: Pt does not appear to be in any pain however does not want to do anything because of reported pain in LE's. Pain Descriptors / Indicators: Guarding Pain Intervention(s): Limited activity within patient's tolerance, Monitored during session, Repositioned      Hand Dominance Right   Extremity/Trunk Assessment Upper Extremity Assessment Upper Extremity Assessment: Generalized weakness (Grossly 4-/5)   Lower Extremity Assessment Lower Extremity Assessment: Defer to PT evaluation RLE Deficits / Details: Grossly 2/5 LLE Deficits / Details: Grossly 2/5   Cervical / Trunk Assessment Cervical / Trunk Assessment:  (Unable to assess as pt refusing EOB and OOB.)   Communication Communication Communication: No difficulties   Cognition Arousal/Alertness: Awake/alert Behavior During Therapy: WFL for tasks assessed/performed Overall Cognitive Status: Impaired/Different from baseline Area of Impairment: Memory, Following commands, Safety/judgement, Awareness, Problem solving, Attention                   Current Attention Level: Sustained Memory: Decreased short-term memory Following Commands: Follows one step commands inconsistently, Follows one step commands with increased time Safety/Judgement: Decreased awareness of safety Awareness: Intellectual Problem Solving: Slow processing, Requires verbal cues, Decreased initiation General Comments: Pt with difficulty recalling accurate details of coversation with the MD. Pt reporting to daughter on the phone she is on strict bed rest, and refusing to attempt mobility with PT stating the MD told her to stay in bed until tomorrow due to her lymphadema and heart problems. Pt not oriented, reporting that she has spoke to MD this afternoon and is not getting OOB tonight (during day). Pt with max difficulty with attention and when asked to count backward from 20-1, counting backward from 20-10 with one error, then max internally/externally distracted, requiring max cues to complete task, and not completing fully.     General Comments       Exercises     Shoulder Instructions      Home Living Family/patient expects to be discharged to:: Private residence Living Arrangements: Spouse/significant  other Available Help at Discharge: Family;Available PRN/intermittently;Available 24 hours/day Type of Home: House Home Access: Stairs to enter [employer] of Steps: 2-3 Entrance Stairs-Rails: None Home Layout: One level     Bathroom Shower/Tub: Producer, television/film/video: Handicapped height     Home Equipment: Cane - single point;Shower seat;Adaptive equipment;Rolling Walker (2 wheels);Grab bars - tub/shower;Grab bars - toilet;Hand held shower head Adaptive Equipment: Reacher;Sock aid        Prior Functioning/Environment Prior Level of Function : Needs assist             Mobility Comments: Used RW around house, but reports used cane more ADLs Comments: Pt reports was independent prior to arrival, but per chart, was receiving help with bathing and dressing in June this year        OT Problem List: Decreased strength;Decreased activity tolerance;Decreased cognition;Decreased safety awareness;Decreased knowledge of use of DME or AE;Obesity;Pain      OT Treatment/Interventions: Self-care/ADL training;Therapeutic exercise;DME and/or AE instruction;Therapeutic activities;Patient/family education;Balance training;Cognitive remediation/compensation;Energy conservation    OT Goals(Current goals can be found in the care plan section) Acute Rehab OT Goals Patient Stated Goal: Not to get OOB OT Goal Formulation: With patient Time For Goal Achievement: 05/15/22 Potential to Achieve Goals: Fair  OT Frequency: Min 2X/week    Co-evaluation  AM-PAC OT "6 Clicks" Daily Activity     Outcome Measure Help from another person eating meals?: None Help from another person taking care of personal grooming?: A Little Help from another person toileting, which includes using toliet, bedpan, or urinal?: Total Help from another person bathing (including washing, rinsing, drying)?: A Lot Help from another person to put on and taking off regular upper body  clothing?: A Lot Help from another person to put on and taking off regular lower body clothing?: Total 6 Click Score: 13   End of Session Nurse Communication: Mobility status;Other (comment) (Not wanting to get OOB)  Activity Tolerance: Treatment limited secondary to agitation;Other (comment) (Pt not oriented, and refusing OOB due to belief MD does not want her to move today) Patient left: in bed;with call bell/phone within reach;with bed alarm set  OT Visit Diagnosis: Muscle weakness (generalized) (M62.81);Other symptoms and signs involving cognitive function;Pain Pain - part of body: Leg (BLE)                Time: 1030-1103 OT Time Calculation (min): 33 min Charges:  OT General Charges $OT Visit: 1 Visit OT Evaluation $OT Eval Moderate Complexity: 1 Mod  Ladene Artist, OTR/L [facility] Acute Rehabilitation Office: [phone]  Drue Novel 05/01/2022, 1:50 PM

## 2022-05-01 NOTE — NC FL2 (Signed)
[place] MEDICAID FL2 LEVEL OF CARE SCREENING TOOL     IDENTIFICATION  Patient Name: Amy Wright Birthdate: 10/30/1940 Sex: female Admission Date (Current Location): 04/29/2022  [facility] and [place] Number:  Producer, television/film/video and Address:  [facility]. [facility], [address], [place], [place] [address]      Provider Number: [id number]  Attending Physician Name and Address:  Elgergawy, Leana Roe, MD  Relative Name and Phone Number:       Current Level of Care: Hospital Recommended Level of Care: Skilled Nursing Facility Prior Approval Number:    Date Approved/Denied:   PASRR Number: [id number]  Discharge Plan: SNF    Current Diagnoses: Patient Active Problem List   Diagnosis Date Noted   Swelling of right hand 04/30/2022   Acute encephalopathy 04/30/2022   Lower extremity edema 03/06/2022   BMI 37.0-37.9, adult 03/06/2022   Candidiasis of skin 03/06/2022   Encephalopathy acute 03/06/2022   AMS (altered mental status) 03/05/2022   Coagulopathy (HCC) 01/29/2021   Acute blood loss anemia 07/27/2019   Left-sided epistaxis    Long term current use of anticoagulant therapy    Atrial fibrillation with RVR (HCC) 07/25/2019   Breast pain, left 12/03/2016   Hx of long term use of blood thinners 04/01/2016   Uncontrolled type 2 diabetes mellitus with hyperglycemia (HCC) 11/26/2015   Microscopic hematuria 09/19/2015   Left foot pain 08/22/2015   Obesity 02/15/2015   Frequency of urination 06/13/2014   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   Depression 03/22/2014   Insomnia 03/22/2014   Vaginitis 03/22/2014   Hyperlipemia 03/22/2014   Hypertriglyceridemia 03/22/2014   Atrial fibrillation (HCC) 08/09/2013   Obstructive sleep apnea syndrome 08/07/2013   Persistent atrial fibrillation (HCC) 03/29/2013   Anticoagulated on Coumadin 03/29/2013   DM2 (diabetes mellitus, type 2) (HCC) 03/29/2013   Mixed hyperlipidemia 03/29/2013   Essential hypertension 03/29/2013     Orientation RESPIRATION BLADDER Height & Weight     Self, Place  Normal Incontinent Weight: 118 kg Height:  5\' 5"  (165.1 cm)  BEHAVIORAL SYMPTOMS/MOOD NEUROLOGICAL BOWEL NUTRITION STATUS      Continent Diet (Carb modified with thin liquids)  AMBULATORY STATUS COMMUNICATION OF NEEDS Skin   Limited Assist Verbally  (legs with some weaping/ abdominal rash)                       Personal Care Assistance Level of Assistance  Bathing, Feeding, Dressing Bathing Assistance: Limited assistance Feeding assistance: Independent Dressing Assistance: Limited assistance     Functional Limitations Info  Sight, Hearing, Speech Sight Info: Adequate Hearing Info: Adequate Speech Info: Adequate    SPECIAL CARE FACTORS FREQUENCY  PT (By licensed PT), OT (By licensed OT)     PT Frequency: 5x/wk OT Frequency: 5x/wk            Contractures Contractures Info: Not present    Additional Factors Info  Psychotropic, Insulin Sliding Scale Code Status Info: Full Allergies Info: Rosuvastatin Calcium   Statins   Amoxicillin   Atorvastatin   Ezetimibe-simvastatin   Penicillins Psychotropic Info: Zoloft 100 mg daily Insulin Sliding Scale Info: Novolog 0-9 units SQ every 4 hours/ Semglee 28 units SQ daily       Current Medications (05/01/2022):  This is the current hospital active medication list Current Facility-Administered Medications  Medication Dose Route Frequency Provider Last Rate Last Admin   acetaminophen (TYLENOL) tablet 1,000 mg  1,000 mg Oral Q6H PRN Elgergawy, 05/03/2022, MD  apixaban (ELIQUIS) tablet 5 mg  5 mg Oral BID Lyda Perone M, DO   5 mg at 05/01/22 1024   cefTRIAXone (ROCEPHIN) 1 g in sodium chloride 0.9 % 100 mL IVPB  1 g Intravenous Q24H Lyda Perone M, DO 200 mL/hr at 04/30/22 2311 1 g at 04/30/22 2311   cholecalciferol (VITAMIN D3) 25 MCG (1000 UNIT) tablet 1,000 Units  1,000 Units Oral Daily Elgergawy, Leana Roe, MD   1,000 Units at 05/01/22 1024    dextrose 50 % solution 0-50 mL  0-50 mL Intravenous PRN Hillary Bow, DO       diltiazem (CARDIZEM CD) 24 hr capsule 360 mg  360 mg Oral Daily Julian Reil, Jared M, DO   360 mg at 05/01/22 1024   ezetimibe (ZETIA) tablet 10 mg  10 mg Oral QHS Elgergawy, Leana Roe, MD   10 mg at 04/30/22 2100   folic acid (FOLVITE) tablet 1 mg  1 mg Oral q morning Elgergawy, Leana Roe, MD   1 mg at 05/01/22 1024   furosemide (LASIX) injection 40 mg  40 mg Intravenous Once Elgergawy, Leana Roe, MD       furosemide (LASIX) tablet 40 mg  40 mg Oral Daily Elgergawy, Leana Roe, MD   40 mg at 05/01/22 1025   haloperidol lactate (HALDOL) injection 2-4 mg  2-4 mg Intravenous Q6H PRN Hillary Bow, DO   2 mg at 04/30/22 0649   insulin aspart (novoLOG) injection 0-9 Units  0-9 Units Subcutaneous Q4H Elgergawy, Leana Roe, MD   3 Units at 05/01/22 1245   insulin glargine-yfgn (SEMGLEE) injection 28 Units  28 Units Subcutaneous Daily Elgergawy, Leana Roe, MD   28 Units at 05/01/22 1025   irbesartan (AVAPRO) tablet 150 mg  150 mg Oral Daily Elgergawy, Leana Roe, MD   150 mg at 05/01/22 1246   metoprolol succinate (TOPROL-XL) 24 hr tablet 50 mg  50 mg Oral Daily Elgergawy, Leana Roe, MD   50 mg at 05/01/22 1025   ondansetron (ZOFRAN) tablet 4 mg  4 mg Oral Q6H PRN Hillary Bow, DO       Or   ondansetron [facility] injection 4 mg  4 mg Intravenous Q6H PRN Hillary Bow, DO       sertraline (ZOLOFT) tablet 100 mg  100 mg Oral q morning Elgergawy, Leana Roe, MD   100 mg at 05/01/22 1024     Discharge Medications: Please see discharge summary for a list of discharge medications.  Relevant Imaging Results:  Relevant Lab Results:   Additional Information SSI: [id number]  Kermit Balo, RN

## 2022-05-01 NOTE — NC FL2 (Deleted)
[place] MEDICAID FL2 LEVEL OF CARE SCREENING TOOL     IDENTIFICATION  Patient Name: Amy Wright Birthdate: April 26, 1941 Sex: female Admission Date (Current Location): 04/29/2022  [facility] and [place] Number:  Producer, television/film/video and Address:  [facility] [place]. [facility], [address], [place], [place] [address]      Provider Number: [id number]  Attending Physician Name and Address:  Elgergawy, Leana Roe, MD  Relative Name and Phone Number:       Current Level of Care: Hospital Recommended Level of Care: Skilled Nursing Facility Prior Approval Number:    Date Approved/Denied:   PASRR Number: [id number]  Discharge Plan: SNF    Current Diagnoses: Patient Active Problem List   Diagnosis Date Noted   Swelling of right hand 04/30/2022   Acute encephalopathy 04/30/2022   Lower extremity edema 03/06/2022   BMI 37.0-37.9, adult 03/06/2022   Candidiasis of skin 03/06/2022   Encephalopathy acute 03/06/2022   AMS (altered mental status) 03/05/2022   Coagulopathy (HCC) 01/29/2021   Acute blood loss anemia 07/27/2019   Left-sided epistaxis    Long term current use of anticoagulant therapy    Atrial fibrillation with RVR (HCC) 07/25/2019   Breast pain, left 12/03/2016   Hx of long term use of blood thinners 04/01/2016   Uncontrolled type 2 diabetes mellitus with hyperglycemia (HCC) 11/26/2015   Microscopic hematuria 09/19/2015   Left foot pain 08/22/2015   Obesity 02/15/2015   Frequency of urination 06/13/2014   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   Depression 03/22/2014   Insomnia 03/22/2014   Vaginitis 03/22/2014   Hyperlipemia 03/22/2014   Hypertriglyceridemia 03/22/2014   Atrial fibrillation (HCC) 08/09/2013   Obstructive sleep apnea syndrome 08/07/2013   Persistent atrial fibrillation (HCC) 03/29/2013   Anticoagulated on Coumadin 03/29/2013   DM2 (diabetes mellitus, type 2) (HCC) 03/29/2013   Mixed hyperlipidemia 03/29/2013   Essential hypertension 03/29/2013     Orientation RESPIRATION BLADDER Height & Weight     Self, Place  Normal Incontinent Weight: 118 kg Height:  5\' 5"  (165.1 cm)  BEHAVIORAL SYMPTOMS/MOOD NEUROLOGICAL BOWEL NUTRITION STATUS      Continent Diet (Carb modified with thin liquids)  AMBULATORY STATUS COMMUNICATION OF NEEDS Skin   Limited Assist Verbally  (legs with some weaping/ abdominal rash)                       Personal Care Assistance Level of Assistance  Bathing, Feeding, Dressing Bathing Assistance: Limited assistance Feeding assistance: Independent Dressing Assistance: Limited assistance     Functional Limitations Info  Sight, Hearing, Speech Sight Info: Adequate Hearing Info: Adequate Speech Info: Adequate    SPECIAL CARE FACTORS FREQUENCY  PT (By licensed PT), OT (By licensed OT)     PT Frequency: 5x/wk OT Frequency: 5x/wk            Contractures Contractures Info: Not present    Additional Factors Info  Code Status, Allergies Code Status Info: Full Allergies Info: Rosuvastatin Calcium   Statins   Amoxicillin   Atorvastatin   Ezetimibe-simvastatin   Penicillins           Current Medications (05/01/2022):  This is the current hospital active medication list Current Facility-Administered Medications  Medication Dose Route Frequency Provider Last Rate Last Admin   acetaminophen (TYLENOL) tablet 1,000 mg  1,000 mg Oral Q6H PRN Elgergawy, 05/03/2022, MD       apixaban (ELIQUIS) tablet 5 mg  5 mg Oral BID Leana Roe, DO  5 mg at 05/01/22 1024   cefTRIAXone (ROCEPHIN) 1 g in sodium chloride 0.9 % 100 mL IVPB  1 g Intravenous Q24H Lyda Perone M, DO 200 mL/hr at 04/30/22 2311 1 g at 04/30/22 2311   cholecalciferol (VITAMIN D3) 25 MCG (1000 UNIT) tablet 1,000 Units  1,000 Units Oral Daily Elgergawy, Leana Roe, MD   1,000 Units at 05/01/22 1024   dextrose 50 % solution 0-50 mL  0-50 mL Intravenous PRN Hillary Bow, DO       diltiazem (CARDIZEM CD) 24 hr capsule 360 mg  360 mg Oral  Daily Julian Reil, Jared M, DO   360 mg at 05/01/22 1024   ezetimibe (ZETIA) tablet 10 mg  10 mg Oral QHS Elgergawy, Leana Roe, MD   10 mg at 04/30/22 2100   folic acid (FOLVITE) tablet 1 mg  1 mg Oral q morning Elgergawy, Leana Roe, MD   1 mg at 05/01/22 1024   furosemide (LASIX) injection 40 mg  40 mg Intravenous Once Elgergawy, Leana Roe, MD       furosemide (LASIX) tablet 40 mg  40 mg Oral Daily Elgergawy, Leana Roe, MD   40 mg at 05/01/22 1025   haloperidol lactate (HALDOL) injection 2-4 mg  2-4 mg Intravenous Q6H PRN Hillary Bow, DO   2 mg at 04/30/22 0649   insulin aspart (novoLOG) injection 0-9 Units  0-9 Units Subcutaneous Q4H Elgergawy, Leana Roe, MD   3 Units at 05/01/22 1245   insulin glargine-yfgn (SEMGLEE) injection 28 Units  28 Units Subcutaneous Daily Elgergawy, Leana Roe, MD   28 Units at 05/01/22 1025   irbesartan (AVAPRO) tablet 150 mg  150 mg Oral Daily Elgergawy, Leana Roe, MD   150 mg at 05/01/22 1246   metoprolol succinate (TOPROL-XL) 24 hr tablet 50 mg  50 mg Oral Daily Elgergawy, Leana Roe, MD   50 mg at 05/01/22 1025   ondansetron (ZOFRAN) tablet 4 mg  4 mg Oral Q6H PRN Hillary Bow, DO       Or   ondansetron [facility]) injection 4 mg  4 mg Intravenous Q6H PRN Hillary Bow, DO       sertraline (ZOLOFT) tablet 100 mg  100 mg Oral q morning Elgergawy, Leana Roe, MD   100 mg at 05/01/22 1024     Discharge Medications: Please see discharge summary for a list of discharge medications.  Relevant Imaging Results:  Relevant Lab Results:   Additional Information SSI: [id number]  Kermit Balo, RN

## 2022-05-01 NOTE — Progress Notes (Signed)
PROGRESS NOTE    Amy Wright  BEE:[id number] DOB: 1941-07-14 DOA: 04/29/2022 PCP: Angelica Chessman, MD   Chief Complaint  Patient presents with   Altered Mental Status    Brief Narrative:   Amy Wright is a 81 y.o. female with medical history significant of HTN, HLD, OSA, DM2, recurrent UTIs, Perm AF on eliquis. - Pt with h/o delirium with prior UTIs per family.   Pt last admitted in May of this year with delirium.  Believed to be due to polypharmacy (ativan + ambien) and possibly UTI as well (UCx with 100k CFU of K.Pneumo).   Since then, no longer on ativan nor ambien.   Today pt with AMS onset, persistent, and worsening over the past 24h.   Patient was found naked on the floor of the room today, rummaging through the belongings in the room, and speaking nonsensically.  This is similar to prior delirium with UTIs.    Assessment & Plan:   Principal Problem:   Encephalopathy acute Active Problems:   Recurrent UTI   Uncontrolled type 2 diabetes mellitus with hyperglycemia (HCC)   Swelling of right hand   Persistent atrial fibrillation (HCC)   Essential hypertension   Lower extremity edema   Acute encephalopathy  Encephalopathy metabolic acute - Consistent with delirium presumably secondary to UTI.  H/o same multiple times in past per family (and chart too). -As well she does appear to be having hospital delirium as well -CT head with no acute findings, but significant of chronic atrophy and ischemic changes  Recurrent UTI -Continue with IV Rocephin, follow on urine cultures, so far growing gram-negative rods  Swelling of right hand -Resolved, overall she appears to be with generalized swelling more than localized right arm swelling.   Uncontrolled type 2 diabetes mellitus with hyperglycemia (HCC) -CBG was significantly elevated to 509 on admission for which she was started on insulin drip. -She is now on Semglee and insulin sliding scale  Lower extremity  edema History of same, lymphedema.   Essential hypertension Continue home BP meds   Persistent atrial fibrillation (HCC) Continue eliquis Continue cardizem Not sure if still on metoprolol or not, med rec pending at this time.     DVT prophylaxis:Eliquis  Code Status: Full Family Communication: D/W daughter by phone 8/23 Disposition: pending PT evaluation, likely home with home health.  Status is: Inpatient  Consultants:  none   Subjective:  No significant events overnight, she denies any complaints  Objective: Vitals:   04/30/22 2053 04/30/22 2317 05/01/22 0415 05/01/22 0813  BP: (!) 158/93 (!) 144/73 (!) 156/93 (!) 166/107  Pulse: 98 95 96 (!) 101  Resp:  20    Temp: 98 F (36.7 C) 98.6 F (37 C) 99.3 F (37.4 C)   TempSrc: Oral Oral Oral Oral  SpO2: 98% 96% 96% 98%  Weight:      Height:        Intake/Output Summary (Last 24 hours) at 05/01/2022 1112 Last data filed at 05/01/2022 0400 Gross per 24 hour  Intake 1389.37 ml  Output 400 ml  Net 989.37 ml   Filed Weights   04/29/22 1918  Weight: 118 kg    Examination:   Awake Alert, conversant, minimally confused Symmetrical Chest wall movement, Good air movement bilaterally, CTAB RRR,No Gallops,Rubs or new Murmurs, No Parasternal Heave +ve B.Sounds, Abd Soft, No tenderness, No rebound - guarding or rigidity. No Cyanosis, Clubbing, plus lymphedema, No new Rash or bruise       Data Reviewed:  I have personally reviewed following labs and imaging studies  CBC: Recent Labs  Lab 04/29/22 2057 04/30/22 0355  WBC 10.9* 9.2  NEUTROABS 7.8*  --   HGB 11.8* 11.7*  HCT 38.1 37.3  MCV 75.6* 74.9*  PLT 383 378    Basic Metabolic Panel: Recent Labs  Lab 04/29/22 2057 04/30/22 0355 05/01/22 0322  NA 132* 133* 134*  K 3.9 3.7 3.7  CL 98 103 102  CO2 19* 18* 20*  GLUCOSE 466* 332* 230*  BUN 18 18 18   CREATININE 1.11* 1.18* 0.99  CALCIUM 9.2 9.1 8.7*    GFR: Estimated Creatinine Clearance:  59.2 mL/min (by C-G formula based on SCr of 0.99 mg/dL).  Liver Function Tests: No results for input(s): "AST", "ALT", "ALKPHOS", "BILITOT", "PROT", "ALBUMIN" in the last 168 hours.  CBG: Recent Labs  Lab 04/30/22 1742 04/30/22 2056 04/30/22 2323 05/01/22 0419 05/01/22 0959  GLUCAP 172* 259* 223* 220* 225*     Recent Results (from the past 240 hour(s))  SARS Coronavirus 2 by RT PCR (hospital order, performed in [facility] hospital lab) *cepheid single result test* Anterior Nasal Swab     Status: None   Collection Time: 04/29/22  8:17 PM   Specimen: Anterior Nasal Swab  Result Value Ref Range Status   SARS Coronavirus 2 by RT PCR NEGATIVE NEGATIVE Final    Comment: (NOTE) SARS-CoV-2 target nucleic acids are NOT DETECTED.  The SARS-CoV-2 RNA is generally detectable in upper and lower respiratory specimens during the acute phase of infection. The lowest concentration of SARS-CoV-2 viral copies this assay can detect is 250 copies / mL. A negative result does not preclude SARS-CoV-2 infection and should not be used as the sole basis for treatment or other patient management decisions.  A negative result may occur with improper specimen collection / handling, submission of specimen other than nasopharyngeal swab, presence of viral mutation(s) within the areas targeted by this assay, and inadequate number of viral copies (<250 copies / mL). A negative result must be combined with clinical observations, patient history, and epidemiological information.  Fact Sheet for Patients:   05/01/22  Fact Sheet for Healthcare Providers: RoadLapTop.co.za  This test is not yet approved or  cleared by the http://kim-miller.com/ FDA and has been authorized for detection and/or diagnosis of SARS-CoV-2 by FDA under an Emergency Use Authorization (EUA).  This EUA will remain in effect (meaning this test can be used) for the duration of  the COVID-19 declaration under Section 564(b)(1) of the Act, 21 U.S.C. section 360bbb-3(b)(1), unless the authorization is terminated or revoked sooner.  Performed at [facility], [address] [place], [place]   Urine Culture     Status: Abnormal (Preliminary result)   Collection Time: 04/29/22 10:53 PM   Specimen: Urine, Clean Catch  Result Value Ref Range Status   Specimen Description URINE, CLEAN CATCH  Final   Special Requests   Final    NONE Performed at [facility], [address] [place], [place]    Culture >=100,000 COLONIES/mL GRAM NEGATIVE RODS (A)  Final   Report Status PENDING  Incomplete         Radiology Studies: CT HEAD WO CONTRAST ([id number])  Result Date: 04/29/2022 CLINICAL DATA:  Altered mental status. EXAM: CT HEAD WITHOUT CONTRAST TECHNIQUE: Contiguous axial images were obtained from the base of the skull through the vertex without intravenous contrast. RADIATION DOSE REDUCTION: This exam was performed according to the departmental  dose-optimization program which includes automated exposure control, adjustment of the mA and/or kV according to patient size and/or use of iterative reconstruction technique. COMPARISON:  Head CT dated 03/06/2022. FINDINGS: Brain: There is mild age-related atrophy and moderate chronic microvascular ischemic changes. There is no acute intracranial hemorrhage. No mass effect or midline shift. No extra-axial fluid collection. Vascular: No hyperdense vessel or unexpected calcification. Skull: Normal. Negative for fracture or focal lesion. Sinuses/Orbits: No acute finding. Other: None IMPRESSION: 1. No acute intracranial pathology. 2. Age-related atrophy and chronic microvascular ischemic changes. Electronically Signed   By: Elgie Collard M.D.   On: 04/29/2022 21:40   DG Chest 2 View  Result Date: 04/29/2022 CLINICAL DATA:  Chest discomfort.  Altered mental status. EXAM: CHEST - 2 VIEW COMPARISON:   01/25/2022 FINDINGS: Stable cardiomegaly. There is slight increased interstitial thickening. No focal airspace disease. No pleural effusion. No pneumothorax. Stable osseous structures. IMPRESSION: Unchanged cardiomegaly. Slight increased interstitial thickening may represent pulmonary edema. Electronically Signed   By: Narda Rutherford M.D.   On: 04/29/2022 21:03        Scheduled Meds:  apixaban  5 mg Oral BID   cholecalciferol  1,000 Units Oral Daily   diltiazem  360 mg Oral Daily   ezetimibe  10 mg Oral QHS   folic acid  1 mg Oral q morning   furosemide  40 mg Oral Daily   insulin aspart  0-9 Units Subcutaneous Q4H   insulin glargine-yfgn  28 Units Subcutaneous Daily   metoprolol succinate  50 mg Oral Daily   sertraline  100 mg Oral q morning   Continuous Infusions:  cefTRIAXone (ROCEPHIN)  IV 1 g (04/30/22 2311)   lactated ringers 75 mL/hr at 04/30/22 1943     LOS: 1 day     Huey Bienenstock, MD [facility]   To contact the attending provider between 7A-7P or the covering provider during after hours 7P-7A, please log into the web site [email or web address] and access using universal [place] password for that web site. If you do not have the password, please call the hospital operator.  05/01/2022, 11:12 AM

## 2022-05-02 DIAGNOSIS — G9340 Encephalopathy, unspecified: Secondary | ICD-10-CM | POA: Diagnosis not present

## 2022-05-02 DIAGNOSIS — N390 Urinary tract infection, site not specified: Secondary | ICD-10-CM | POA: Diagnosis not present

## 2022-05-02 DIAGNOSIS — E1165 Type 2 diabetes mellitus with hyperglycemia: Secondary | ICD-10-CM | POA: Diagnosis not present

## 2022-05-02 LAB — GLUCOSE, CAPILLARY
Glucose-Capillary: 216 mg/dL — ABNORMAL HIGH (ref 70–99)
Glucose-Capillary: 221 mg/dL — ABNORMAL HIGH (ref 70–99)
Glucose-Capillary: 221 mg/dL — ABNORMAL HIGH (ref 70–99)
Glucose-Capillary: 227 mg/dL — ABNORMAL HIGH (ref 70–99)
Glucose-Capillary: 240 mg/dL — ABNORMAL HIGH (ref 70–99)
Glucose-Capillary: 241 mg/dL — ABNORMAL HIGH (ref 70–99)
Glucose-Capillary: 263 mg/dL — ABNORMAL HIGH (ref 70–99)

## 2022-05-02 LAB — BASIC METABOLIC PANEL
Anion gap: 10 (ref 5–15)
BUN: 12 mg/dL (ref 8–23)
CO2: 23 mmol/L (ref 22–32)
Calcium: 8.8 mg/dL — ABNORMAL LOW (ref 8.9–10.3)
Chloride: 103 mmol/L (ref 98–111)
Creatinine, Ser: 0.81 mg/dL (ref 0.44–1.00)
GFR, Estimated: >60 mL/min (ref >60)
Glucose, Bld: 229 mg/dL — ABNORMAL HIGH (ref 70–99)
Potassium: 3.8 mmol/L (ref 3.5–5.1)
Sodium: 136 mmol/L (ref 135–145)

## 2022-05-02 LAB — URINE CULTURE: Culture: >=100000 — AB

## 2022-05-02 MED ORDER — METOPROLOL SUCCINATE ER 50 MG PO TB24
75.0000 mg | ORAL_TABLET | Freq: Every day | ORAL | Status: DC
  Filled 2022-05-02: qty 1

## 2022-05-02 MED ORDER — INSULIN GLARGINE-YFGN 100 UNIT/ML ~~LOC~~ SOLN
35.0000 [IU] | Freq: Every day | SUBCUTANEOUS | Status: DC
  Administered 2022-05-03 – 2022-05-04 (×2): 35 [IU] via SUBCUTANEOUS
  Filled 2022-05-02 (×3): qty 0.35

## 2022-05-02 MED ORDER — CYANOCOBALAMIN 1000 MCG/ML IJ SOLN
1000.0000 ug | Freq: Every day | INTRAMUSCULAR | Status: DC
  Administered 2022-05-03 – 2022-05-05 (×3): 1000 ug via INTRAMUSCULAR
  Filled 2022-05-02 (×3): qty 1

## 2022-05-02 MED ORDER — QUETIAPINE FUMARATE 25 MG PO TABS
25.0000 mg | ORAL_TABLET | Freq: Every day | ORAL | Status: DC
Start: 2022-05-02 — End: 2022-05-05
  Administered 2022-05-02 – 2022-05-04 (×3): 25 mg via ORAL
  Filled 2022-05-02 (×3): qty 1

## 2022-05-02 NOTE — Plan of Care (Signed)
  Problem: Education: Goal: Knowledge of General Education information will improve Description: Including pain rating scale, medication(s)/side effects and non-pharmacologic comfort measures Outcome: Progressing   Problem: Health Behavior/Discharge Planning: Goal: Ability to manage health-related needs will improve Outcome: Progressing   Problem: Clinical Measurements: Goal: Ability to maintain clinical measurements within normal limits will improve Outcome: Progressing   Problem: Activity: Goal: Risk for activity intolerance will decrease Outcome: Progressing   Problem: Coping: Goal: Level of anxiety will decrease Outcome: Progressing   Problem: Pain Managment: Goal: General experience of comfort will improve Outcome: Progressing   Problem: Safety: Goal: Ability to remain free from injury will improve Outcome: Progressing   

## 2022-05-02 NOTE — Progress Notes (Signed)
Physical Therapy Treatment Patient Details Name: Amy Wright MRN: [id number] DOB: 03/21/1941 Today's Date: 05/02/2022   History of Present Illness Pt is a 81 y/o female who presents with AMS on 04/29/22. Pt found to have UTI and delirium. Pt with recent admission for the same in 02/2022. PMH significant for BLE edema, HTN, a-fib, DM II, non-compliance with meds.    PT Comments    Patient progressing slowly towards PT goals. Continues to demonstrate confusion, oriented to self and time only but improved from yesterday per family in room. Also exhibits deficits relating to attention, safety, awareness, following commands and problem solving. Pt redirectable during session. Requires Min-mod A to stand from different surfaces and Min guard assist and use of RW for support. Fatigues quickly. Continues to be appropriate for SNF to maximize independence and mobility prior to return home. Will follow acutely.   Recommendations for follow up therapy are one component of a multi-disciplinary discharge planning process, led by the attending physician.  Recommendations may be updated based on patient status, additional functional criteria and insurance authorization.  Follow Up Recommendations  Skilled nursing-short term rehab (<3 hours/day) Can patient physically be transported by private vehicle: Yes   Assistance Recommended at Discharge Frequent or constant Supervision/Assistance  Patient can return home with the following Assistance with cooking/housework;Direct supervision/assist for medications management;Direct supervision/assist for financial management;Assist for transportation;Help with stairs or ramp for entrance;A lot of help with walking and/or transfers;A lot of help with bathing/dressing/bathroom   Equipment Recommendations  None recommended by PT    Recommendations for Other Services       Precautions / Restrictions Precautions Precautions: Fall Restrictions Weight Bearing  Restrictions: No     Mobility  Bed Mobility Overal bed mobility: Needs Assistance Bed Mobility: Sit to Supine       Sit to supine: Min assist, HOB elevated   General bed mobility comments: MIN A to elevate BLEs back to bed    Transfers Overall transfer level: Needs assistance Equipment used: Rolling walker (2 wheels) Transfers: Sit to/from Stand Sit to Stand: Mod assist, Min assist           General transfer comment: Mod A to power to standing from chair with use of momentum, multiple attempts needed, unable to perform without help, difficulty lifting bottom off chair, pt pulling up on RW. Stood from [employer], from toilet x1 using grab bars.    Ambulation/Gait Ambulation/Gait assistance: Min guard Gait Distance (Feet): 24 Feet (x2 bouts) Assistive device: Rolling walker (2 wheels) Gait Pattern/deviations: Step-through pattern, Decreased stride length, Trunk flexed Gait velocity: decreased Gait velocity interpretation: <1.31 ft/sec, indicative of household ambulator   General Gait Details: Slow, mostly steady gait with heavy reliance on RW for support, 1 seated rest break in bathroom. Reports fatigue.   Stairs             Wheelchair Mobility    Modified Rankin (Stroke Patients Only)       Balance Overall balance assessment: Needs assistance Sitting-balance support: Feet supported, No upper extremity supported Sitting balance-Leahy Scale: Fair Sitting balance - Comments: Able to reach outside BoS to doff socks with increased time and no LOB.   Standing balance support: During functional activity Standing balance-Leahy Scale: Poor Standing balance comment: able to stand at sink to wash hands with no UE support with min guard and no LOB  Cognition Arousal/Alertness: Awake/alert Behavior During Therapy: WFL for tasks assessed/performed Overall Cognitive Status: Impaired/Different from baseline Area of Impairment:  Orientation, Attention, Memory, Following commands, Safety/judgement, Awareness, Problem solving                 Orientation Level: Place, Situation, Disoriented to Current Attention Level: Sustained, Selective Memory: Decreased short-term memory Following Commands: Follows one step commands inconsistently, Follows one step commands with increased time Safety/Judgement: Decreased awareness of safety, Decreased awareness of deficits Awareness: Intellectual Problem Solving: Slow processing, Decreased initiation, Difficulty sequencing, Requires verbal cues, Requires tactile cues General Comments: pts daughter and husband present with pts cog improving with family present in comparision to previous session. pt disoriented to situation stating she was at home, pt easily distracted and perseverates on extraneous topics but fairly easy to redirect. pt insistent it is evening even though time is ~ 2 pm even with enviromental cues provided pt insistently it is evening time. pt required cues for safety during ADLS and functional mobility. "I am a fall risk, I am not allowed to get up."        Exercises      General Comments General comments (skin integrity, edema, etc.): Daughter and spouse present during session.      Pertinent Vitals/Pain Pain Assessment Pain Assessment: Faces Faces Pain Scale: Hurts a little bit Pain Location: LUE, BLEs Pain Descriptors / Indicators: Discomfort, Grimacing, Guarding Pain Intervention(s): Monitored during session, Repositioned, Limited activity within patient's tolerance    Home Living                          Prior Function            PT Goals (current goals can now be found in the care plan section) Progress towards PT goals: Progressing toward goals    Frequency    Min 2X/week      PT Plan Current plan remains appropriate    Co-evaluation   Reason for Co-Treatment: Necessary to address cognition/behavior during functional  activity;For patient/therapist safety   OT goals addressed during session: ADL's and self-care      AM-PAC PT "6 Clicks" Mobility   Outcome Measure  Help needed turning from your back to your side while in a flat bed without using bedrails?: A Little Help needed moving from lying on your back to sitting on the side of a flat bed without using bedrails?: A Little Help needed moving to and from a bed to a chair (including a wheelchair)?: A Lot Help needed standing up from a chair using your arms (e.g., wheelchair or bedside chair)?: A Lot Help needed to walk in hospital room?: Total Help needed climbing 3-5 steps with a railing? : Total 6 Click Score: 12    End of Session Equipment Utilized During Treatment: Gait belt Activity Tolerance: Patient tolerated treatment well Patient left: in bed;with call bell/phone within reach;with bed alarm set;with family/visitor present Nurse Communication: Mobility status PT Visit Diagnosis: Pain;History of falling (Z91.81);Other (comment) Pain - Right/Left:  (bil) Pain - part of body: Leg     Time: 1338-1410 PT Time Calculation (min) (ACUTE ONLY): 32 min  Charges:  $Therapeutic Activity: 8-22 mins                     Vale Haven, PT, DPT Acute Rehabilitation Services Secure chat preferred Office [phone]      Blake Divine A Lanier Ensign 05/02/2022, 2:58 PM

## 2022-05-02 NOTE — TOC Progression Note (Signed)
Transition of Care [facility]) - Progression Note    Patient Details  Name: Amy Wright MRN: [id number] Date of Birth: July 06, 1941  Transition of Care [facility]) CM/SW Contact  Kermit Balo, RN Phone Number: 05/02/2022, 1:08 PM  Clinical Narrative:    Spouse and daughter have decided on [facility] and Rehab. Cm has updated Nikki at [facility] and they wont have a bed until Monday. CM has asked [facility]A to begin insurance auth.  TOC following.   Expected Discharge Plan: Skilled Nursing Facility Barriers to Discharge: Continued Medical Work up  Expected Discharge Plan and Services Expected Discharge Plan: Skilled Nursing Facility In-house Referral: Clinical Social Work Discharge Planning Services: CM Consult Post Acute Care Choice: Skilled Nursing Facility Living arrangements for the past 2 months: Single Family Home                                       Social Determinants of Health (SDOH) Interventions    Readmission Risk Interventions     No data to display

## 2022-05-02 NOTE — Progress Notes (Signed)
Pt conti to pull at lines, tubing, removing telemetry, and purwick. Pt w/ confusing, at times forgetting who her care nurse is and staff her at [facility] Accusing this RN of foul play. Pt refusing telemetry, care from this RN, and getting OOB. Dr. Leafy Half informed.

## 2022-05-02 NOTE — Progress Notes (Signed)
Patient refused to allow staff to obtain vital signs, or wear tele monitoring equipment.  She has been educated on the need for these things and she continues to refuse.  I have notified her daughter Annice Pih and Dr. Randol Kern.  She refuses to allow staff to do an assessment.

## 2022-05-02 NOTE — Progress Notes (Signed)
Patient continues to try and get out of bed after education about fall prevention.  Patient placed in chair with alarm and educated.  She continues to attempt to get out of the chair without assistance.  Family and MD have been notified.

## 2022-05-02 NOTE — Care Management Important Message (Signed)
Important Message  Patient Details  Name: Amy Wright MRN: [id number] Date of Birth: 01/17/1941   Medicare Important Message Given:  Yes     Dorena Bodo 05/02/2022, 2:46 PM

## 2022-05-02 NOTE — Progress Notes (Signed)
Occupational Therapy Treatment Patient Details Name: Amy Wright MRN: [id number] DOB: [birth year] Today's Date: 05/02/2022   History of present illness Pt is a 81 y/o female who presents with AMS on 04/29/22. Pt found to have UTI and delirium. Pt with recent admission for the same in 02/2022. PMH significant for BLE edema, HTN, a-fib, DM II, non-compliance with meds.   OT comments  Pt seen in conjunction with PT to maximize pts activity tolerance and optimize pt participation. Pt continues to present with impaired cognition greatly impacting pts ability to complete BADLs independently. Pt currently requires min guard assist for ambulatory ADL transfers with Rw, min guard for standing grooming tasks at sink,min guard for 3/3 toileting tasks, MODA  for UB ADLS ( d/t impaired problem solving) and min guard for LB ADLs with + time. Pt would continue to benefit from skilled occupational therapy while admitted and after d/c to address the below listed limitations in order to improve overall functional mobility and facilitate independence with BADL participation. DC plan remains appropriate, will follow acutely per POC.      Recommendations for follow up therapy are one component of a multi-disciplinary discharge planning process, led by the attending physician.  Recommendations may be updated based on patient status, additional functional criteria and insurance authorization.    Follow Up Recommendations  Skilled nursing-short term rehab (<3 hours/day)    Assistance Recommended at Discharge Frequent or constant Supervision/Assistance  Patient can return home with the following  Two people to help with walking and/or transfers;Two people to help with bathing/dressing/bathroom;Assistance with cooking/housework;Assistance with feeding;Direct supervision/assist for medications management;Direct supervision/assist for financial management;Assist for transportation;Help with stairs or ramp for entrance    Equipment Recommendations  Other (comment) (defer)    Recommendations for Other Services      Precautions / Restrictions Precautions Precautions: Fall Restrictions Weight Bearing Restrictions: No       Mobility Bed Mobility   Bed Mobility: Sit to Supine       Sit to supine: Min assist   General bed mobility comments: MIN A to elevate BLEs back to bed    Transfers Overall transfer level: Needs assistance Equipment used: Rolling walker (2 wheels) Transfers: Sit to/from Stand Sit to Stand: Min assist           General transfer comment: MIN A to rise from recliner with + time and effort, pt prefers to pull up on RW. MINA to rise from toilet with cues for hand placement on grab bars     Balance Overall balance assessment: Needs assistance Sitting-balance support: Feet supported, No upper extremity supported Sitting balance-Leahy Scale: Fair     Standing balance support: No upper extremity supported, During functional activity Standing balance-Leahy Scale: Fair Standing balance comment: able to stand at sink to wash hands with no UE support with min guard and no LOB                           ADL either performed or assessed with clinical judgement   ADL Overall ADL's : Needs assistance/impaired     Grooming: Wash/dry hands;Standing;Min guard Grooming Details (indicate cue type and reason): for safety, pt able to locate needed items at sink with no cues     Lower Body Bathing: Supervison/ safety;Sitting/lateral leans Lower Body Bathing Details (indicate cue type and reason): simulated via anterior pericare Upper Body Dressing : Moderate assistance;Sitting;Cueing for sequencing Upper Body Dressing Details (indicate cue type and reason): to  don back side gown, cues for sequencing Lower Body Dressing: Min guard;Sitting/lateral leans Lower Body Dressing Details (indicate cue type and reason): pt able to doff socks and don flip flops with min guard for  safety Toilet Transfer: Min guard;Grab bars;Rolling walker (2 wheels);Cueing for safety;Cueing for sequencing;Ambulation Toilet Transfer Details (indicate cue type and reason): min guard for safety and hand placement/ Rw placement Toileting- Clothing Manipulation and Hygiene: Min guard;Sitting/lateral lean Toileting - Clothing Manipulation Details (indicate cue type and reason): anterior pericare     Functional mobility during ADLs: Min guard;Rolling walker (2 wheels) General ADL Comments: ADL participation impacted by impaired cog    Extremity/Trunk Assessment Upper Extremity Assessment Upper Extremity Assessment: Generalized weakness;LUE deficits/detail LUE:  (lymphedema)   Lower Extremity Assessment Lower Extremity Assessment: Generalized weakness        Vision   Vision Assessment?: No apparent visual deficits (per gross assessment)   Perception Perception Perception: Within Functional Limits   Praxis Praxis Praxis: Intact    Cognition Arousal/Alertness: Awake/alert Behavior During Therapy: WFL for tasks assessed/performed Overall Cognitive Status: Impaired/Different from baseline Area of Impairment: Orientation, Attention, Memory, Following commands, Safety/judgement, Awareness, Problem solving                 Orientation Level: Place, Situation (reports she is at home) Current Attention Level: Sustained, Selective Memory: Decreased short-term memory Following Commands: Follows one step commands inconsistently, Follows one step commands with increased time Safety/Judgement: Decreased awareness of safety, Decreased awareness of deficits Awareness: Intellectual Problem Solving: Slow processing, Decreased initiation, Difficulty sequencing, Requires verbal cues, Requires tactile cues General Comments: pts daughter and husband present with pts cog improving with family present in comparision to previous session. pt disoriented to situation stating she was at home, pt  easily distracted and perseverates on extraneous topics but fairly easy to redirect. pt insistent it is evening even though time is ~ 2 pm even with enviromental cues provided pt insistently it is evening time. pt required cues for safety during ADLS and functional mobility        Exercises      Shoulder Instructions       General Comments daughter and husband present during session    Pertinent Vitals/ Pain       Pain Assessment Pain Assessment: Faces Faces Pain Scale: Hurts a little bit Pain Location: LUE, BLEs Pain Descriptors / Indicators: Discomfort, Grimacing, Guarding Pain Intervention(s): Limited activity within patient's tolerance, Monitored during session, Repositioned  Home Living                                          Prior Functioning/Environment              Frequency  Min 2X/week        Progress Toward Goals  OT Goals(current goals can now be found in the care plan section)  Progress towards OT goals: Progressing toward goals  Acute Rehab OT Goals Patient Stated Goal: to get in the bed OT Goal Formulation: With patient/family Time For Goal Achievement: 05/15/22 Potential to Achieve Goals: Fair  Plan Discharge plan remains appropriate;Frequency remains appropriate    Co-evaluation    PT/OT/SLP Co-Evaluation/Treatment: Yes Reason for Co-Treatment: Necessary to address cognition/behavior during functional activity;For patient/therapist safety   OT goals addressed during session: ADL's and self-care      AM-PAC OT "6 Clicks" Daily Activity     Outcome  Measure   Help from another person eating meals?: None Help from another person taking care of personal grooming?: A Little Help from another person toileting, which includes using toliet, bedpan, or urinal?: A Little Help from another person bathing (including washing, rinsing, drying)?: A Lot Help from another person to put on and taking off regular upper body clothing?:  A Little Help from another person to put on and taking off regular lower body clothing?: A Little 6 Click Score: 18    End of Session Equipment Utilized During Treatment: Gait belt;Rolling walker (2 wheels)  OT Visit Diagnosis: Muscle weakness (generalized) (M62.81);Other symptoms and signs involving cognitive function;Pain Pain - part of body:  (BLEs; LUE)   Activity Tolerance Patient tolerated treatment well   Patient Left in bed;with call bell/phone within reach;with bed alarm set;with family/visitor present   Nurse Communication Mobility status        Time: 1338-1410 OT Time Calculation (min): 32 min  Charges: OT General Charges $OT Visit: 1 Visit OT Treatments $Self Care/Home Management : 8-22 mins  Lenor Derrick., COTA/L Acute Rehabilitation Services [phone]   Barron Schmid 05/02/2022, 2:43 PM

## 2022-05-02 NOTE — Progress Notes (Signed)
Patient refused to allow staff to take vital signs.  Marylene Land, RN Engineer, manufacturing systems) and MD notified.  05/02/22 0800  Vitals  Temp  (refused)

## 2022-05-02 NOTE — Progress Notes (Signed)
PROGRESS NOTE    Amy Wright  [id number] DOB: 08/01/1941 DOA: 04/29/2022 PCP: Robyne Peers, MD   Chief Complaint  Patient presents with   Altered Mental Status    Brief Narrative:   Amy Wright is a 81 y.o. female with medical history significant of HTN, HLD, OSA, DM2, recurrent UTIs, Perm AF on eliquis. - Pt with h/o delirium with prior UTIs per family.   Pt last admitted in May of this year with delirium.  Believed to be due to polypharmacy (ativan + ambien) and possibly UTI as well (UCx with 100k CFU of K.Pneumo).   Since then, no longer on ativan nor ambien.   Today pt with AMS onset, persistent, and worsening over the past 24h.   Patient was found naked on the floor of the room today, rummaging through the belongings in the room, and speaking nonsensically.  This is similar to prior delirium with UTIs.    Assessment & Plan:   Principal Problem:   Encephalopathy acute Active Problems:   Recurrent UTI   Uncontrolled type 2 diabetes mellitus with hyperglycemia (HCC)   Swelling of right hand   Persistent atrial fibrillation (HCC)   Essential hypertension   Lower extremity edema   Acute encephalopathy  Encephalopathy metabolic acute - Consistent with delirium presumably secondary to UTI.  H/o same multiple times in past per family (and chart too). 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 -Mirtazapine has been discontinued.  Recurrent UTI/Klebsiella pneumonia -Continue with IV Rocephin, culture growing Klebsiella pneumonia, will keep on IV Rocephin during hospital stay, and switch to Keflex on discharge for complicated UTI.  Swelling of right  hand -Resolved,   Uncontrolled type 2 diabetes mellitus with hyperglycemia (HCC) -CBG was significantly elevated to 509 on admission for which she was started on insulin drip. -She is now on Semglee and insulin sliding scale, will increase her Semglee to 35 units.  Lower extremity edema History of same, lymphedema.   Essential hypertension Continue home BP meds   Persistent atrial fibrillation ([place]) Continue eliquis Continue cardizem Continue with metoprolol     DVT prophylaxis:Eliquis  Code Status: Full Family Communication: D/W daughter by phone 8/23, 8/25 Disposition: SNF in 1 to 2 days Status is: Inpatient  Consultants:  Psychiatry   Subjective:  Did report poor night sleep, otherwise denies any complaints, no significant events.  Objective: Vitals:   05/01/22 1621 05/01/22 2025 05/01/22 2345 05/02/22 0326  BP: (!) 156/87 (!) 163/104 (!) 182/99 (!) 166/89  Pulse: 78 98 (!) 103 (!) 105  Resp:  18 20 20   Temp:  98 F (36.7 C) 98.2 F (36.8 C) 98.3 F (36.8 C)  TempSrc:  Oral Oral Oral  SpO2: 95% 94% 93% 94%  Weight:      Height:        Intake/Output Summary (Last 24 hours) at 05/02/2022 1129 Last data filed at 05/02/2022 0400 Gross per 24 hour  Intake 633.48 ml  Output 1500 ml  Net -866.52 ml   Filed Weights   04/29/22 1918  Weight: 118 kg    Examination:   Awake Alert, Oriented X 3, she is with impaired judgment and insight Symmetrical Chest wall movement, Good air  movement bilaterally, CTAB RRR,No Gallops,Rubs or new Murmurs, No Parasternal Heave +ve B.Sounds, Abd Soft, No tenderness, No rebound - guarding or rigidity. No Cyanosis, Clubbing , she has lymphedema,  No new Rash or bruise        Data Reviewed: I have personally reviewed following labs and imaging studies  CBC: Recent Labs  Lab 04/29/22 2057 04/30/22 0355  WBC 10.9* 9.2  NEUTROABS 7.8*  --   HGB 11.8* 11.7*  HCT 38.1 37.3  MCV 75.6* 74.9*  PLT 383 378    Basic  Metabolic Panel: Recent Labs  Lab 04/29/22 2057 04/30/22 0355 05/01/22 0322 05/02/22 0354  NA 132* 133* 134* 136  K 3.9 3.7 3.7 3.8  CL 98 103 102 103  CO2 19* 18* 20* 23  GLUCOSE 466* 332* 230* 229*  BUN 18 18 18 12   CREATININE 1.11* 1.18* 0.99 0.81  CALCIUM 9.2 9.1 8.7* 8.8*    GFR: Estimated Creatinine Clearance: 72.4 mL/min (by C-G formula based on SCr of 0.81 mg/dL).  Liver Function Tests: No results for input(s): "AST", "ALT", "ALKPHOS", "BILITOT", "PROT", "ALBUMIN" in the last 168 hours.  CBG: Recent Labs  Lab 05/01/22 2014 05/02/22 0049 05/02/22 0258 05/02/22 0631 05/02/22 0736  GLUCAP 216* 221* 221* 240* 216*     Recent Results (from the past 240 hour(s))  SARS Coronavirus 2 by RT PCR (hospital order, performed in [facility] hospital lab) *cepheid single result test* Anterior Nasal Swab     Status: None   Collection Time: 04/29/22  8:17 PM   Specimen: Anterior Nasal Swab  Result Value Ref Range Status   SARS Coronavirus 2 by RT PCR NEGATIVE NEGATIVE Final    Comment: (NOTE) SARS-CoV-2 target nucleic acids are NOT DETECTED.  The SARS-CoV-2 RNA is generally detectable in upper and lower respiratory specimens during the acute phase of infection. The lowest concentration of SARS-CoV-2 viral copies this assay can detect is 250 copies / mL. A negative result does not preclude SARS-CoV-2 infection and should not be used as the sole basis for treatment or other patient management decisions.  A negative result may occur with improper specimen collection / handling, submission of specimen other than nasopharyngeal swab, presence of viral mutation(s) within the areas targeted by this assay, and inadequate number of viral copies (<250 copies / mL). A negative result must be combined with clinical observations, patient history, and epidemiological information.  Fact Sheet for Patients:   05/01/22  Fact Sheet for Healthcare  Providers: RoadLapTop.co.za  This test is not yet approved or  cleared by the http://kim-miller.com/ FDA and has been authorized for detection and/or diagnosis of SARS-CoV-2 by FDA under an Emergency Use Authorization (EUA).  This EUA will remain in effect (meaning this test can be used) for the duration of the COVID-19 declaration under Section 564(b)(1) of the Act, 21 U.S.C. section 360bbb-3(b)(1), unless the authorization is terminated or revoked sooner.  Performed at [facility], [address], [place], [place]   Urine Culture     Status: Abnormal   Collection Time: 04/29/22 10:53 PM   Specimen: Urine, Clean Catch  Result Value Ref Range Status   Specimen Description URINE, CLEAN CATCH  Final   Special Requests   Final    NONE Performed at [facility], [address], [place], [place]    Culture >=100,000 COLONIES/mL KLEBSIELLA PNEUMONIAE (A)  Final   Report Status 05/02/2022 FINAL  Final   Organism ID, Bacteria KLEBSIELLA  PNEUMONIAE (A)  Final      Susceptibility   Klebsiella pneumoniae - MIC*    AMPICILLIN >=32 RESISTANT Resistant     CEFAZOLIN <=4 SENSITIVE Sensitive     CEFEPIME <=0.12 SENSITIVE Sensitive     CEFTRIAXONE <=0.25 SENSITIVE Sensitive     CIPROFLOXACIN <=0.25 SENSITIVE Sensitive     GENTAMICIN <=1 SENSITIVE Sensitive     IMIPENEM <=0.25 SENSITIVE Sensitive     NITROFURANTOIN <=16 SENSITIVE Sensitive     TRIMETH/SULFA <=20 SENSITIVE Sensitive     AMPICILLIN/SULBACTAM 4 SENSITIVE Sensitive     PIP/TAZO <=4 SENSITIVE Sensitive     * >=100,000 COLONIES/mL KLEBSIELLA PNEUMONIAE         Radiology Studies: No results found.      Scheduled Meds:  apixaban  5 mg Oral BID   cholecalciferol  1,000 Units Oral Daily   cyanocobalamin  1,000 mcg Intramuscular Daily   diltiazem  360 mg Oral Daily   ezetimibe  10 mg Oral QHS   folic acid  1 mg Oral q morning   furosemide  40 mg Oral Daily   insulin  aspart  0-9 Units Subcutaneous Q4H   insulin glargine-yfgn  35 Units Subcutaneous Daily   irbesartan  150 mg Oral Daily   metoprolol succinate  50 mg Oral Daily   QUEtiapine  25 mg Oral QHS   sertraline  100 mg Oral q morning   Continuous Infusions:  cefTRIAXone (ROCEPHIN)  IV Stopped (05/01/22 2350)     LOS: 2 days     Huey Bienenstock, MD [facility]   To contact the attending provider between 7A-7P or the covering provider during after hours 7P-7A, please log into the web site [email or web address] and access using universal [place] password for that web site. If you do not have the password, please call the hospital operator.  05/02/2022, 11:29 AM

## 2022-05-03 DIAGNOSIS — R600 Localized edema: Secondary | ICD-10-CM

## 2022-05-03 DIAGNOSIS — G9340 Encephalopathy, unspecified: Secondary | ICD-10-CM | POA: Diagnosis not present

## 2022-05-03 DIAGNOSIS — R4182 Altered mental status, unspecified: Secondary | ICD-10-CM

## 2022-05-03 DIAGNOSIS — R319 Hematuria, unspecified: Secondary | ICD-10-CM

## 2022-05-03 DIAGNOSIS — I10 Essential (primary) hypertension: Secondary | ICD-10-CM | POA: Diagnosis not present

## 2022-05-03 DIAGNOSIS — N390 Urinary tract infection, site not specified: Secondary | ICD-10-CM | POA: Diagnosis not present

## 2022-05-03 LAB — GLUCOSE, CAPILLARY
Glucose-Capillary: 179 mg/dL — ABNORMAL HIGH (ref 70–99)
Glucose-Capillary: 251 mg/dL — ABNORMAL HIGH (ref 70–99)
Glucose-Capillary: 240 mg/dL — ABNORMAL HIGH (ref 70–99)
Glucose-Capillary: 189 mg/dL — ABNORMAL HIGH (ref 70–99)

## 2022-05-03 MED ORDER — HYDRALAZINE HCL 20 MG/ML IJ SOLN
10.0000 mg | Freq: Four times a day (QID) | INTRAMUSCULAR | Status: DC | PRN
  Administered 2022-05-03 – 2022-05-05 (×3): 10 mg via INTRAVENOUS
  Filled 2022-05-03 (×3): qty 1

## 2022-05-03 MED ORDER — INSULIN ASPART 100 UNIT/ML IJ SOLN: 3.0000 [IU] | Freq: Three times a day (TID) | INTRAMUSCULAR | Status: DC

## 2022-05-03 MED ORDER — INSULIN ASPART 100 UNIT/ML IJ SOLN
0.0000 [IU] | Freq: Three times a day (TID) | INTRAMUSCULAR | Status: DC
  Administered 2022-05-03: 5 [IU] via SUBCUTANEOUS
  Administered 2022-05-04: 8 [IU] via SUBCUTANEOUS
  Administered 2022-05-04: 11 [IU] via SUBCUTANEOUS
  Administered 2022-05-04: 5 [IU] via SUBCUTANEOUS
  Administered 2022-05-05: 3 [IU] via SUBCUTANEOUS
  Administered 2022-05-05: 2 [IU] via SUBCUTANEOUS

## 2022-05-03 MED ORDER — IRBESARTAN 300 MG PO TABS
300.0000 mg | ORAL_TABLET | Freq: Every day | ORAL | Status: DC
  Administered 2022-05-04 – 2022-05-05 (×2): 300 mg via ORAL
  Filled 2022-05-03 (×2): qty 1

## 2022-05-03 MED ORDER — INSULIN ASPART 100 UNIT/ML IJ SOLN: 0.0000 [IU] | Freq: Every day | INTRAMUSCULAR | Status: DC

## 2022-05-03 MED ORDER — MAGNESIUM SULFATE 2 GM/50ML IV SOLN
2.0000 g | Freq: Once | INTRAVENOUS | Status: AC
  Administered 2022-05-03: 2 g via INTRAVENOUS
  Filled 2022-05-03: qty 50

## 2022-05-03 MED ORDER — FUROSEMIDE 10 MG/ML IJ SOLN
20.0000 mg | Freq: Once | INTRAMUSCULAR | Status: AC
  Administered 2022-05-03: 20 mg via INTRAVENOUS
  Filled 2022-05-03: qty 4

## 2022-05-03 MED ORDER — GABAPENTIN 300 MG PO CAPS
300.0000 mg | ORAL_CAPSULE | Freq: Three times a day (TID) | ORAL | Status: DC
  Administered 2022-05-03 – 2022-05-05 (×7): 300 mg via ORAL
  Filled 2022-05-03 (×7): qty 1

## 2022-05-03 MED ORDER — METOPROLOL SUCCINATE ER 100 MG PO TB24
100.0000 mg | ORAL_TABLET | Freq: Every day | ORAL | Status: DC
  Administered 2022-05-03: 100 mg via ORAL
  Filled 2022-05-03: qty 1

## 2022-05-03 NOTE — Progress Notes (Signed)
PROGRESS NOTE    Amy Wright  SWN:[id number] DOB: 1941/08/16 DOA: 04/29/2022 PCP: Angelica Chessman, MD   Chief Complaint  Patient presents with   Altered Mental Status    Brief Narrative:   Amy Wright is a 81 y.o. female with medical history significant of HTN, HLD, OSA, DM2, recurrent UTIs, Perm AF on eliquis. - Pt with h/o delirium with prior UTIs per family.   Pt last admitted in May of this year with delirium.  Believed to be due to polypharmacy (ativan + ambien) and possibly UTI as well (UCx with 100k CFU of K.Pneumo).   Since then, no longer on ativan nor ambien.   Today pt with AMS onset, persistent, and worsening over the past 24h.   Patient was found naked on the floor of the room today, rummaging through the belongings in the room, and speaking nonsensically.  This is similar to prior delirium with UTIs.    Assessment & Plan:   Principal Problem:   Encephalopathy acute Active Problems:   Recurrent UTI   Uncontrolled type 2 diabetes mellitus with hyperglycemia (HCC)   Swelling of right hand   Persistent atrial fibrillation (HCC)   Essential hypertension   Lower extremity edema   Acute encephalopathy  Encephalopathy metabolic acute - Consistent with delirium presumably secondary to UTI.  H/o same multiple times in past per family (and chart too). 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 -Mirtazapine has been discontinued.  Recurrent UTI/Klebsiella pneumonia -Continue with IV Rocephin, culture growing Klebsiella pneumonia, will keep on IV Rocephin during hospital stay, and switch to Keflex on discharge for complicated UTI.  Swelling of right  hand -Resolved,   Uncontrolled type 2 diabetes mellitus with hyperglycemia (HCC) -CBG was significantly elevated to 509 on admission for which she was started on insulin drip. -Improved with increasing Semglee, I will change her sliding scale to moderate.  Lower extremity edema History of same, lymphedema. We will give extra IV Lasix today, as well she was educated about fluid restriction.   Essential hypertension Continue home BP meds, remains uncontrolled, medication uptitrated, Proehl XL has been increased to 125 mg oral daily, continue with current dose of Bypro and as needed hydralazine.  ine.    Persistent atrial fibrillation (HCC) Continue eliquis Continue cardizem Continue with metoprolol     DVT prophylaxis:Eliquis  Code Status: Full Family Communication: D/W daughter by phone 8/23, 8/25 Disposition: SNF in 1 to 2 days Status is: Inpatient  Consultants:  Psychiatry   Subjective:  No significant events overnight, she denies any complaints this morning  Objective: Vitals:   05/02/22 2055 05/02/22 2340 05/03/22 0358 05/03/22 0744  BP: (!) 161/97 (!) 185/83 (!) 158/99 (!) 169/126  Pulse: 87 65 100 98  Resp: 18  16 18   Temp: 98.4 F (36.9 C) 98.7 F (37.1 C) 99.8 F (37.7 C) 98.2 F (36.8 C)  TempSrc: Oral Oral Oral Oral  SpO2: 96% 96% 96% 93%  Weight:      Height:        Intake/Output Summary (Last 24 hours) at 05/03/2022 1134 Last data filed at 05/03/2022 0900 Gross per 24 hour  Intake 220 ml  Output --  Net 220 ml  Filed Weights   04/29/22 1918  Weight: 118 kg    Examination:   Awake Alert, Oriented X 3, impaired judgment and insight Symmetrical Chest wall movement, Good air movement bilaterally, CTAB RRR,No Gallops,Rubs or new Murmurs, No Parasternal Heave +ve B.Sounds, Abd Soft, No tenderness, No rebound - guarding or rigidity. No Cyanosis, Clubbing , + lymphdema, No new Rash or bruise         Data Reviewed: I have personally  reviewed following labs and imaging studies  CBC: Recent Labs  Lab 04/29/22 2057 04/30/22 0355  WBC 10.9* 9.2  NEUTROABS 7.8*  --   HGB 11.8* 11.7*  HCT 38.1 37.3  MCV 75.6* 74.9*  PLT 383 378    Basic Metabolic Panel: Recent Labs  Lab 04/29/22 2057 04/30/22 0355 05/01/22 0322 05/02/22 0354  NA 132* 133* 134* 136  K 3.9 3.7 3.7 3.8  CL 98 103 102 103  CO2 19* 18* 20* 23  GLUCOSE 466* 332* 230* 229*  BUN 18 18 18 12   CREATININE 1.11* 1.18* 0.99 0.81  CALCIUM 9.2 9.1 8.7* 8.8*    GFR: Estimated Creatinine Clearance: 72.4 mL/min (by C-G formula based on SCr of 0.81 mg/dL).  Liver Function Tests: No results for input(s): "AST", "ALT", "ALKPHOS", "BILITOT", "PROT", "ALBUMIN" in the last 168 hours.  CBG: Recent Labs  Lab 05/02/22 1415 05/02/22 2055 05/02/22 2329 05/03/22 0742 05/03/22 1128  GLUCAP 263* 227* 241* 179* 251*     Recent Results (from the past 240 hour(s))  SARS Coronavirus 2 by RT PCR (hospital order, performed in [facility] hospital lab) *cepheid single result test* Anterior Nasal Swab     Status: None   Collection Time: 04/29/22  8:17 PM   Specimen: Anterior Nasal Swab  Result Value Ref Range Status   SARS Coronavirus 2 by RT PCR NEGATIVE NEGATIVE Final    Comment: (NOTE) SARS-CoV-2 target nucleic acids are NOT DETECTED.  The SARS-CoV-2 RNA is generally detectable in upper and lower respiratory specimens during the acute phase of infection. The lowest concentration of SARS-CoV-2 viral copies this assay can detect is 250 copies / mL. A negative result does not preclude SARS-CoV-2 infection and should not be used as the sole basis for treatment or other patient management decisions.  A negative result may occur with improper specimen collection / handling, submission of specimen other than nasopharyngeal swab, presence of viral mutation(s) within the areas targeted by this assay, and inadequate number of viral copies (<250 copies / mL). A  negative result must be combined with clinical observations, patient history, and epidemiological information.  Fact Sheet for Patients:   05/01/22  Fact Sheet for Healthcare Providers: RoadLapTop.co.za  This test is not yet approved or  cleared by the http://kim-miller.com/ FDA and has been authorized for detection and/or diagnosis of SARS-CoV-2 by FDA under an Emergency Use Authorization (EUA).  This EUA will remain in effect (meaning this test can be used) for the duration of the COVID-19 declaration under Section 564(b)(1) of the Act, 21 U.S.C. section 360bbb-3(b)(1), unless the authorization is terminated or revoked sooner.  Performed at [facility], [address], [place], [place]   Urine Culture     Status: Abnormal   Collection Time: 04/29/22 10:53 PM   Specimen: Urine, Clean Catch  Result Value Ref Range Status   Specimen Description URINE, CLEAN CATCH  Final   Special Requests   Final    NONE Performed at [facility], [address], [place], [place] [address]    Culture >=100,000 COLONIES/mL KLEBSIELLA PNEUMONIAE (A)  Final   Report Status 05/02/2022 FINAL  Final   Organism ID, Bacteria KLEBSIELLA PNEUMONIAE (A)  Final      Susceptibility   Klebsiella pneumoniae - MIC*    AMPICILLIN >=32 RESISTANT Resistant     CEFAZOLIN <=4 SENSITIVE Sensitive     CEFEPIME <=0.12 SENSITIVE Sensitive     CEFTRIAXONE <=0.25 SENSITIVE Sensitive     CIPROFLOXACIN <=0.25 SENSITIVE Sensitive     GENTAMICIN <=1 SENSITIVE Sensitive     IMIPENEM <=0.25 SENSITIVE Sensitive     NITROFURANTOIN <=16 SENSITIVE Sensitive     TRIMETH/SULFA <=20 SENSITIVE Sensitive     AMPICILLIN/SULBACTAM 4 SENSITIVE Sensitive     PIP/TAZO <=4 SENSITIVE Sensitive     * >=100,000 COLONIES/mL KLEBSIELLA PNEUMONIAE         Radiology Studies: No results found.      Scheduled Meds:  apixaban  5 mg Oral BID   cholecalciferol   1,000 Units Oral Daily   cyanocobalamin  1,000 mcg Intramuscular Daily   diltiazem  360 mg Oral Daily   ezetimibe  10 mg Oral QHS   folic acid  1 mg Oral q morning   furosemide  20 mg Intravenous Once   furosemide  40 mg Oral Daily   gabapentin  300 mg Oral TID   insulin aspart  0-9 Units Subcutaneous Q4H   insulin glargine-yfgn  35 Units Subcutaneous Daily   [START ON 05/04/2022] irbesartan  300 mg Oral Daily   metoprolol succinate  100 mg Oral Daily   QUEtiapine  25 mg Oral QHS   sertraline  100 mg Oral q morning   Continuous Infusions:  cefTRIAXone (ROCEPHIN)  IV 1 g (05/02/22 2355)   magnesium sulfate bolus IVPB       LOS: 3 days     Phillips Climes, MD [facility]   To contact the attending provider between 7A-7P or the covering provider during after hours 7P-7A, please log into the web site [email or web address] and access using universal [place] password for that web site. If you do not have the password, please call the hospital operator.  05/03/2022, 11:34 AM

## 2022-05-03 NOTE — Plan of Care (Signed)

## 2022-05-04 DIAGNOSIS — I10 Essential (primary) hypertension: Secondary | ICD-10-CM | POA: Diagnosis not present

## 2022-05-04 DIAGNOSIS — I4819 Other persistent atrial fibrillation: Secondary | ICD-10-CM | POA: Diagnosis not present

## 2022-05-04 DIAGNOSIS — G9340 Encephalopathy, unspecified: Secondary | ICD-10-CM | POA: Diagnosis not present

## 2022-05-04 DIAGNOSIS — N390 Urinary tract infection, site not specified: Secondary | ICD-10-CM | POA: Diagnosis not present

## 2022-05-04 LAB — GLUCOSE, CAPILLARY
Glucose-Capillary: 209 mg/dL — ABNORMAL HIGH (ref 70–99)
Glucose-Capillary: 316 mg/dL — ABNORMAL HIGH (ref 70–99)
Glucose-Capillary: 294 mg/dL — ABNORMAL HIGH (ref 70–99)
Glucose-Capillary: 302 mg/dL — ABNORMAL HIGH (ref 70–99)
Glucose-Capillary: 199 mg/dL — ABNORMAL HIGH (ref 70–99)
Glucose-Capillary: 119 mg/dL — ABNORMAL HIGH (ref 70–99)

## 2022-05-04 LAB — BASIC METABOLIC PANEL
Anion gap: 10 (ref 5–15)
BUN: 8 mg/dL (ref 8–23)
CO2: 26 mmol/L (ref 22–32)
Calcium: 8.9 mg/dL (ref 8.9–10.3)
Chloride: 101 mmol/L (ref 98–111)
Creatinine, Ser: 0.83 mg/dL (ref 0.44–1.00)
GFR, Estimated: >60 mL/min (ref >60)
Glucose, Bld: 220 mg/dL — ABNORMAL HIGH (ref 70–99)
Potassium: 3.8 mmol/L (ref 3.5–5.1)
Sodium: 137 mmol/L (ref 135–145)

## 2022-05-04 LAB — MAGNESIUM: Magnesium: 1.6 mg/dL — ABNORMAL LOW (ref 1.7–2.4)

## 2022-05-04 MED ORDER — FUROSEMIDE 40 MG PO TABS: 40.0000 mg | ORAL_TABLET | Freq: Every day | ORAL | Status: DC

## 2022-05-04 MED ORDER — FUROSEMIDE 10 MG/ML IJ SOLN
40.0000 mg | Freq: Two times a day (BID) | INTRAMUSCULAR | Status: AC
  Administered 2022-05-04 (×2): 40 mg via INTRAVENOUS
  Filled 2022-05-04 (×2): qty 4

## 2022-05-04 MED ORDER — POTASSIUM CHLORIDE CRYS ER 20 MEQ PO TBCR
40.0000 meq | EXTENDED_RELEASE_TABLET | Freq: Once | ORAL | Status: AC
Start: 2022-05-04 — End: 2022-05-04
  Administered 2022-05-04: 40 meq via ORAL
  Filled 2022-05-04: qty 2

## 2022-05-04 MED ORDER — METOPROLOL SUCCINATE ER 25 MG PO TB24
125.0000 mg | ORAL_TABLET | Freq: Every day | ORAL | Status: DC
  Administered 2022-05-04: 125 mg via ORAL
  Filled 2022-05-04: qty 1

## 2022-05-04 MED ORDER — INSULIN GLARGINE-YFGN 100 UNIT/ML ~~LOC~~ SOLN
38.0000 [IU] | Freq: Every day | SUBCUTANEOUS | Status: DC
  Administered 2022-05-05: 38 [IU] via SUBCUTANEOUS
  Filled 2022-05-04: qty 0.38

## 2022-05-04 MED ORDER — METOPROLOL SUCCINATE ER 50 MG PO TB24
150.0000 mg | ORAL_TABLET | Freq: Every day | ORAL | Status: DC
  Administered 2022-05-05: 150 mg via ORAL
  Filled 2022-05-04: qty 1

## 2022-05-04 MED ORDER — METOPROLOL SUCCINATE ER 25 MG PO TB24
25.0000 mg | ORAL_TABLET | Freq: Every day | ORAL | Status: DC
  Administered 2022-05-04 – 2022-05-05 (×2): 25 mg via ORAL
  Filled 2022-05-04 (×2): qty 1

## 2022-05-04 MED ORDER — MAGNESIUM SULFATE 2 GM/50ML IV SOLN
2.0000 g | Freq: Once | INTRAVENOUS | Status: AC
  Administered 2022-05-04: 2 g via INTRAVENOUS
  Filled 2022-05-04: qty 50

## 2022-05-04 NOTE — Progress Notes (Signed)
PROGRESS NOTE    Amy Wright  OIN:[id number] DOB: 1941/03/31 DOA: 04/29/2022 PCP: Angelica Chessman, MD   Chief Complaint  Patient presents with   Altered Mental Status    Brief Narrative:   Amy Wright is a 81 y.o. female with medical history significant of HTN, HLD, OSA, DM2, recurrent UTIs, Perm AF on eliquis. - Pt with h/o delirium with prior UTIs per family.   Pt last admitted in May of this year with delirium.  Believed to be due to polypharmacy (ativan + ambien) and possibly UTI as well (UCx with 100k CFU of K.Pneumo).   Since then, no longer on ativan nor ambien.   Today pt with AMS onset, persistent, and worsening over the past 24h.   Patient was found naked on the floor of the room today, rummaging through the belongings in the room, and speaking nonsensically.  This is similar to prior delirium with UTIs.    Assessment & Plan:   Principal Problem:   Encephalopathy acute Active Problems:   Recurrent UTI   Uncontrolled type 2 diabetes mellitus with hyperglycemia (HCC)   Swelling of right hand   Persistent atrial fibrillation (HCC)   Essential hypertension   Lower extremity edema   Acute encephalopathy  Encephalopathy metabolic acute - Consistent with delirium presumably secondary to UTI.  H/o same multiple times in past per family (and chart too). 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 -Mirtazapine has been discontinued.  Recurrent UTI/Klebsiella pneumonia -Continue with IV Rocephin, culture growing Klebsiella pneumonia, will keep on IV Rocephin during hospital stay, and switch to Keflex on discharge for complicated UTI.  Swelling of right  hand -Resolved,   Uncontrolled type 2 diabetes mellitus with hyperglycemia (HCC) -CBG was significantly elevated to 509 on admission for which she was started on insulin drip. -Improved with increasing Semglee, I will change her sliding scale to moderate.  Lower extremity edema History of same, lymphedema. We will give extra IV Lasix today, as well she was educated about fluid restriction.   Essential hypertension Continue home BP meds, remains uncontrolled, medication uptitrated, currently on Toprol-XL 150 mg daily, Avapro dose has been increased as well to 300 mg oral daily, continue with as needed hydralazine.    Persistent atrial fibrillation (HCC) Continue eliquis Continue cardizem Continue with metoprolol     DVT prophylaxis:Eliquis  Code Status: Full Family Communication: D/W daughter by phone 8/23, 8/25 Disposition: SNF in 1 to 2 days Status is: Inpatient  Consultants:  Psychiatry   Subjective:  No significant events overnight, she denies any complaints this morning  Objective: Vitals:   05/04/22 0508 05/04/22 0635 05/04/22 0801 05/04/22 1236  BP: (!) 171/102 (!) 156/94 (!) 168/89 (!) 158/97  Pulse: 77  99 86  Resp: 18  16 17   Temp: 98.2 F (36.8 C)  98.1 F (36.7 C) 98.4 F (36.9 C)  TempSrc: Oral  Oral Oral  SpO2: 97%  97% 98%  Weight:      Height:        Intake/Output Summary (Last 24 hours) at 05/04/2022 1317 Last data filed at 05/04/2022 0951 Gross per 24 hour  Intake 100 ml  Output 2350 ml  Net -2250 ml  Filed Weights   04/29/22 1918  Weight: 118 kg    Examination:   Awake Alert, Oriented X 3, impaired judgment and insight Symmetrical Chest wall movement, Good air movement bilaterally, CTAB RRR,No Gallops,Rubs or new Murmurs, No Parasternal Heave +ve B.Sounds, Abd Soft, No tenderness, No rebound - guarding or rigidity. No Cyanosis, Clubbing , + lymphdema, No new Rash or bruise         Data Reviewed: I have personally reviewed  following labs and imaging studies  CBC: Recent Labs  Lab 04/29/22 2057 04/30/22 0355  WBC 10.9* 9.2  NEUTROABS 7.8*  --   HGB 11.8* 11.7*  HCT 38.1 37.3  MCV 75.6* 74.9*  PLT 383 378     Basic Metabolic Panel: Recent Labs  Lab 04/29/22 2057 04/30/22 0355 05/01/22 0322 05/02/22 0354 05/04/22 0203  NA 132* 133* 134* 136 137  K 3.9 3.7 3.7 3.8 3.8  CL 98 103 102 103 101  CO2 19* 18* 20* 23 26  GLUCOSE 466* 332* 230* 229* 220*  BUN 18 18 18 12 8   CREATININE 1.11* 1.18* 0.99 0.81 0.83  CALCIUM 9.2 9.1 8.7* 8.8* 8.9  MG  --   --   --   --  1.6*     GFR: Estimated Creatinine Clearance: 70.6 mL/min (by C-G formula based on SCr of 0.83 mg/dL).  Liver Function Tests: No results for input(s): "AST", "ALT", "ALKPHOS", "BILITOT", "PROT", "ALBUMIN" in the last 168 hours.  CBG: Recent Labs  Lab 05/03/22 1607 05/03/22 2018 05/04/22 0647 05/04/22 0803 05/04/22 1239  GLUCAP 240* 189* 209* 316* 294*      Recent Results (from the past 240 hour(s))  SARS Coronavirus 2 by RT PCR (hospital order, performed in [facility] hospital lab) *cepheid single result test* Anterior Nasal Swab     Status: None   Collection Time: 04/29/22  8:17 PM   Specimen: Anterior Nasal Swab  Result Value Ref Range Status   SARS Coronavirus 2 by RT PCR NEGATIVE NEGATIVE Final    Comment: (NOTE) SARS-CoV-2 target nucleic acids are NOT DETECTED.  The SARS-CoV-2 RNA is generally detectable in upper and lower respiratory specimens during the acute phase of infection. The lowest concentration of SARS-CoV-2 viral copies this assay can detect is 250 copies / mL. A negative result does not preclude SARS-CoV-2 infection and should not be used as the sole basis for treatment or other patient management decisions.  A negative result may occur with improper specimen collection / handling, submission of specimen other than nasopharyngeal swab, presence of viral mutation(s) within the areas targeted by  this assay, and inadequate number of viral copies (<250 copies / mL). A negative result must be combined with clinical observations, patient history, and epidemiological information.  Fact Sheet for Patients:   05/01/22  Fact Sheet for Healthcare Providers: RoadLapTop.co.za  This test is not yet approved or  cleared by the http://kim-miller.com/ FDA and has been authorized for detection and/or diagnosis of SARS-CoV-2 by FDA under an Emergency Use Authorization (EUA).  This EUA will remain in effect (meaning this test can be used) for the duration of the COVID-19 declaration under Section 564(b)(1) of the Act, 21 U.S.C. section 360bbb-3(b)(1), unless the authorization is terminated or revoked sooner.  Performed at [facility], [address], [place], [place]   Urine Culture     Status: Abnormal   Collection Time: 04/29/22 10:53 PM   Specimen: Urine, Clean Catch  Result Value Ref Range Status  Specimen Description URINE, CLEAN CATCH  Final   Special Requests   Final    NONE Performed at [facility], [address], [place], [place] [address]    Culture >=100,000 COLONIES/mL KLEBSIELLA PNEUMONIAE (A)  Final   Report Status 05/02/2022 FINAL  Final   Organism ID, Bacteria KLEBSIELLA PNEUMONIAE (A)  Final      Susceptibility   Klebsiella pneumoniae - MIC*    AMPICILLIN >=32 RESISTANT Resistant     CEFAZOLIN <=4 SENSITIVE Sensitive     CEFEPIME <=0.12 SENSITIVE Sensitive     CEFTRIAXONE <=0.25 SENSITIVE Sensitive     CIPROFLOXACIN <=0.25 SENSITIVE Sensitive     GENTAMICIN <=1 SENSITIVE Sensitive     IMIPENEM <=0.25 SENSITIVE Sensitive     NITROFURANTOIN <=16 SENSITIVE Sensitive     TRIMETH/SULFA <=20 SENSITIVE Sensitive     AMPICILLIN/SULBACTAM 4 SENSITIVE Sensitive     PIP/TAZO <=4 SENSITIVE Sensitive     * >=100,000 COLONIES/mL KLEBSIELLA PNEUMONIAE         Radiology Studies: No results  found.      Scheduled Meds:  apixaban  5 mg Oral BID   cholecalciferol  1,000 Units Oral Daily   cyanocobalamin  1,000 mcg Intramuscular Daily   diltiazem  360 mg Oral Daily   ezetimibe  10 mg Oral QHS   folic acid  1 mg Oral q morning   furosemide  40 mg Intravenous BID   [START ON 05/05/2022] furosemide  40 mg Oral Daily   gabapentin  300 mg Oral TID   insulin aspart  0-15 Units Subcutaneous TID WC   insulin aspart  0-5 Units Subcutaneous QHS   [START ON 05/05/2022] insulin glargine-yfgn  38 Units Subcutaneous Daily   irbesartan  300 mg Oral Daily   [START ON 05/05/2022] metoprolol succinate  150 mg Oral Daily   metoprolol succinate  25 mg Oral Daily   QUEtiapine  25 mg Oral QHS   sertraline  100 mg Oral q morning   Continuous Infusions:  cefTRIAXone (ROCEPHIN)  IV Stopped (05/03/22 2357)     LOS: 4 days     Huey Bienenstock, MD [facility]   To contact the attending provider between 7A-7P or the covering provider during after hours 7P-7A, please log into the web site [email or web address] and access using universal [place] password for that web site. If you do not have the password, please call the hospital operator.  05/04/2022, 1:17 PM

## 2022-05-05 DIAGNOSIS — N390 Urinary tract infection, site not specified: Secondary | ICD-10-CM | POA: Diagnosis not present

## 2022-05-05 DIAGNOSIS — I10 Essential (primary) hypertension: Secondary | ICD-10-CM | POA: Diagnosis not present

## 2022-05-05 DIAGNOSIS — G9340 Encephalopathy, unspecified: Secondary | ICD-10-CM | POA: Diagnosis not present

## 2022-05-05 DIAGNOSIS — I4819 Other persistent atrial fibrillation: Secondary | ICD-10-CM | POA: Diagnosis not present

## 2022-05-05 LAB — BASIC METABOLIC PANEL
Anion gap: 9 (ref 5–15)
BUN: 10 mg/dL (ref 8–23)
CO2: 29 mmol/L (ref 22–32)
Calcium: 8.6 mg/dL — ABNORMAL LOW (ref 8.9–10.3)
Chloride: 99 mmol/L (ref 98–111)
Creatinine, Ser: 0.72 mg/dL (ref 0.44–1.00)
GFR, Estimated: >60 mL/min (ref >60)
Glucose, Bld: 126 mg/dL — ABNORMAL HIGH (ref 70–99)
Potassium: 3.2 mmol/L — ABNORMAL LOW (ref 3.5–5.1)
Sodium: 137 mmol/L (ref 135–145)

## 2022-05-05 LAB — GLUCOSE, CAPILLARY
Glucose-Capillary: 216 mg/dL — ABNORMAL HIGH (ref 70–99)
Glucose-Capillary: 109 mg/dL — ABNORMAL HIGH (ref 70–99)
Glucose-Capillary: 143 mg/dL — ABNORMAL HIGH (ref 70–99)
Glucose-Capillary: 166 mg/dL — ABNORMAL HIGH (ref 70–99)

## 2022-05-05 MED ORDER — METOPROLOL SUCCINATE ER 50 MG PO TB24: 150.0000 mg | ORAL_TABLET | Freq: Every day | ORAL | Status: DC

## 2022-05-05 MED ORDER — GUAIFENESIN ER 600 MG PO TB12: 600.0000 mg | ORAL_TABLET | Freq: Two times a day (BID) | ORAL | Status: DC

## 2022-05-05 MED ORDER — QUETIAPINE FUMARATE 25 MG PO TABS
25.0000 mg | ORAL_TABLET | Freq: Every day | ORAL | Status: AC
Start: 2022-05-05 — End: ?

## 2022-05-05 MED ORDER — ACETAMINOPHEN 500 MG PO TABS: 500.0000 mg | ORAL_TABLET | ORAL | 0 refills | Status: AC | PRN

## 2022-05-05 MED ORDER — POTASSIUM CHLORIDE CRYS ER 20 MEQ PO TBCR
40.0000 meq | EXTENDED_RELEASE_TABLET | Freq: Four times a day (QID) | ORAL | Status: DC
  Administered 2022-05-05: 40 meq via ORAL
  Filled 2022-05-05: qty 2

## 2022-05-05 MED ORDER — GUAIFENESIN ER 600 MG PO TB12
1200.0000 mg | ORAL_TABLET | Freq: Two times a day (BID) | ORAL | Status: DC
  Administered 2022-05-05: 1200 mg via ORAL
  Filled 2022-05-05: qty 2

## 2022-05-05 MED ORDER — FUROSEMIDE 40 MG PO TABS: 40.0000 mg | ORAL_TABLET | Freq: Two times a day (BID) | ORAL | 3 refills | Status: DC

## 2022-05-05 MED ORDER — POTASSIUM CHLORIDE ER 10 MEQ PO TBCR: 20.0000 meq | EXTENDED_RELEASE_TABLET | Freq: Every day | ORAL | 3 refills | Status: DC

## 2022-05-05 MED ORDER — FUROSEMIDE 10 MG/ML IJ SOLN
60.0000 mg | Freq: Once | INTRAMUSCULAR | Status: AC
  Administered 2022-05-05: 60 mg via INTRAVENOUS
  Filled 2022-05-05: qty 8

## 2022-05-05 MED ORDER — HYDRALAZINE HCL 25 MG PO TABS
25.0000 mg | ORAL_TABLET | Freq: Four times a day (QID) | ORAL | Status: DC
  Administered 2022-05-05 (×2): 25 mg via ORAL
  Filled 2022-05-05 (×2): qty 1

## 2022-05-05 MED ORDER — FUROSEMIDE 40 MG PO TABS: 40.0000 mg | ORAL_TABLET | Freq: Every day | ORAL | Status: DC

## 2022-05-05 MED ORDER — VITAMIN C 1000 MG PO TABS
500.0000 mg | ORAL_TABLET | Freq: Every day | ORAL | Status: AC
Start: 2022-05-05 — End: ?

## 2022-05-05 MED ORDER — HYDRALAZINE HCL 25 MG PO TABS: 25.0000 mg | ORAL_TABLET | Freq: Three times a day (TID) | ORAL | Status: AC

## 2022-05-05 MED ORDER — LANTUS SOLOSTAR 100 UNIT/ML ~~LOC~~ SOPN
42.0000 [IU] | PEN_INJECTOR | Freq: Every day | SUBCUTANEOUS | 11 refills | Status: DC
Start: 2022-05-05 — End: 2023-10-15

## 2022-05-05 NOTE — Discharge Summary (Addendum)
Physician Discharge Summary  Amy Wright ZOX:[id number] DOB: 09-01-1941 DOA: 04/29/2022  PCP: Angelica Chessman, MD  Admit date: 04/29/2022 Discharge date: 05/05/2022  Admitted From: Home, Disposition:  SNF  Recommendations for Outpatient Follow-up:  Please check CBC, BMP in 3 days as she is on increased diuresis Her hypertension and adjust meds as needed Adjust Lasix dose and diabetes meds as needed. Please keep encouraging patient to use incentive spirometry and flutter valve at your facility, they have been provided for her on discharge.   Discharge Condition:Stable CODE STATUS:FULL Diet recommendation: Heart Healthy / Carb Modified  Brief/Interim Summary:  Amy Wright is a 81 y.o. female with medical history significant of HTN, HLD, OSA, DM2, recurrent UTIs, Perm AF on eliquis. - Pt with h/o delirium with prior UTIs per family.   Pt last admitted in May of this year with delirium.  Believed to be due to polypharmacy (ativan + ambien) and possibly UTI as well (UCx with 100k CFU of K.Pneumo).   Since then, no longer on ativan nor ambien. She is admitted to the hospital with altered mental status, her work-up was significant for UTI.  Acute metabolic encephalopathy - Consistent with delirium presumably secondary to UTI.  H/o same multiple times in past per family (and chart too). -As well she did develop hospital delirium as well -CT head with no acute findings, but significant of chronic atrophy and ischemic changes -Had borderline low B12 level last June, so she received IM supplements during hospital stay, and to continue p.o. 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 -Mirtazapine has been discontinued.   Recurrent UTI/Klebsiella pneumonia -Continue with IV Rocephin, culture growing Klebsiella pneumonia,   Swelling of right hand Lower extremity edema Lymphedema   acute on chronic diastolic CHF -Presents with volume overload picture, at baseline she is having some lower extremity lymphedema, but overall she is with volume overload for which she was kept on IV diuresis, volume status has improved, she will be discharged on Lasix 40 mg p.o. twice daily, please monitor labs closely.   Uncontrolled type 2 diabetes mellitus with hyperglycemia (HCC) -CBG was significantly elevated to 509 on admission for which she was started on insulin drip.  She does appear to be on multiple medications at home, patient very poor historian could not verify what meds exactly she was taking, but upon discharge she will be discharged on Lantus, metformin, I will hold glipizide and Mounjaro on discharge    Essential hypertension -She was resumed on her home medication, despite that blood pressure remains significantly uncontrolled, so hydralazine has been added.   Persistent atrial fibrillation (HCC) Continue eliquis Continue cardizem Continue with metoprolol         Discharge Diagnoses:  Principal Problem:   Encephalopathy acute Active Problems:   Recurrent UTI   Uncontrolled type 2 diabetes mellitus with hyperglycemia (HCC)   Swelling of right hand   Persistent atrial fibrillation (HCC)   Essential hypertension   Lower extremity edema   Acute encephalopathy    Discharge Instructions  Discharge Instructions     Diet - low sodium heart healthy   Complete by: As directed    Discharge instructions   Complete by: As directed    Follow with Primary MD Angelica Chessman, MD /SNF physician  Get CBC, CMP,y checked  by Primary MD next visit.    Activity: As tolerated with Full fall precautions use walker/cane &  assistance as needed   Disposition SNF   Diet: Heart Healthy /carb modified   On your next visit with your primary care physician please Get Medicines reviewed and adjusted.   Please request your Prim.MD to go over all Hospital Tests and  Procedure/Radiological results at the follow up, please get all Hospital records sent to your Prim MD by signing hospital release before you go home.   If you experience worsening of your admission symptoms, develop shortness of breath, life threatening emergency, suicidal or homicidal thoughts you must seek medical attention immediately by calling 911 or calling your MD immediately  if symptoms less severe.  You Must read complete instructions/literature along with all the possible adverse reactions/side effects for all the Medicines you take and that have been prescribed to you. Take any new Medicines after you have completely understood and accpet all the possible adverse reactions/side effects.   Do not drive, operating heavy machinery, perform activities at heights, swimming or participation in water activities or provide baby sitting services if your were admitted for syncope or siezures until you have seen by Primary MD or a Neurologist and advised to do so again.  Do not drive when taking Pain medications.    Do not take more than prescribed Pain, Sleep and Anxiety Medications  Special Instructions: If you have smoked or chewed Tobacco  in the last 2 yrs please stop smoking, stop any regular Alcohol  and or any Recreational drug use.  Wear Seat belts while driving.   Please note  You were cared for by a hospitalist during your hospital stay. If you have any questions about your discharge medications or the care you received while you were in the hospital after you are discharged, you can call the unit and asked to speak with the hospitalist on call if the hospitalist that took care of you is not available. Once you are discharged, your primary care physician will handle any further medical issues. Please note that NO REFILLS for any discharge medications will be authorized once you are discharged, as it is imperative that you return to your primary care physician (or establish a  relationship with a primary care physician if you do not have one) for your aftercare needs so that they can reassess your need for medications and monitor your lab values.   Increase activity slowly   Complete by: As directed       Allergies as of 05/05/2022       Reactions   Rosuvastatin Calcium Other (See Comments)   myalgias   Statins Other (See Comments)   myalgia   Amoxicillin Rash   Atorvastatin Other (See Comments)   Back pain   Ezetimibe-simvastatin Other (See Comments)   Back pain   Penicillins Itching, Swelling, Rash   Did it involve swelling of the face/tongue/throat, SOB, or low BP? No Did it involve sudden or severe rash/hives, skin peeling, or any reaction on the inside of your mouth or nose? Yes Did you need to seek medical attention at a hospital or doctor's office? No When did it last happen?   young adult    If all above answers are "NO", may proceed with cephalosporin use.        Medication List     STOP taking these medications    glipiZIDE 10 MG tablet Commonly known as: GLUCOTROL   mirtazapine 7.5 MG tablet Commonly known as: REMERON   Mounjaro 2.5 MG/0.5ML Pen Generic drug: tirzepatide  TAKE these medications    Accu-Chek Compact Plus test strip Generic drug: glucose blood CHECK BLOOD SUGAR TWICE DAILY OR AS DIRECTED   acetaminophen 500 MG tablet Commonly known as: TYLENOL Take 1 tablet (500 mg total) by mouth every 4 (four) hours as needed for headache or mild pain (pain). What changed:  how much to take when to take this   apixaban 5 MG Tabs tablet Commonly known as: ELIQUIS Take 1 tablet (5 mg total) by mouth 2 (two) times daily.   cholecalciferol 25 MCG (1000 UNIT) tablet Commonly known as: VITAMIN D3 Take 1,000 Units by mouth daily.   cyanocobalamin 1000 MCG tablet Commonly known as: VITAMIN B12 Take 1 tablet (1,000 mcg total) by mouth every morning.   diltiazem 360 MG 24 hr capsule Commonly known as: CARDIZEM  CD TAKE 1 CAPSULE BY MOUTH EVERY MORNING.   ezetimibe 10 MG tablet Commonly known as: ZETIA Take 10 mg by mouth at bedtime.   ferrous sulfate 325 (65 FE) MG tablet Take 1 tablet (325 mg total) by mouth 2 (two) times daily with a meal.   folic acid 1 MG tablet Commonly known as: FOLVITE Take 1 mg by mouth every morning.   furosemide 40 MG tablet Commonly known as: LASIX Take 1 tablet (40 mg total) by mouth 2 (two) times daily. What changed: when to take this   gabapentin 300 MG capsule Commonly known as: Neurontin Take 1 capsule (300 mg total) by mouth 3 (three) times daily.   guaiFENesin 600 MG 12 hr tablet Commonly known as: MUCINEX Take 1 tablet (600 mg total) by mouth 2 (two) times daily.   hydrALAZINE 25 MG tablet Commonly known as: APRESOLINE Take 1 tablet (25 mg total) by mouth 3 (three) times daily.   Lantus SoloStar 100 UNIT/ML Solostar Pen Generic drug: insulin glargine Inject 42 Units into the skin daily. Called [facility]- they state that last prescription that was picked up was ordered as Lantus 55 units daily What changed: how much to take   magnesium oxide 400 MG tablet Commonly known as: MAG-OX Take 1 tablet (400 mg total) by mouth daily.   metFORMIN 1000 MG tablet Commonly known as: GLUCOPHAGE Take 1,000 mg by mouth 2 (two) times daily with a meal.   metoprolol succinate 50 MG 24 hr tablet Commonly known as: TOPROL-XL Take 3 tablets (150 mg total) by mouth daily. Take with or immediately following a meal. Start taking on: May 06, 2022 What changed: See the new instructions.   Myrbetriq 50 MG Tb24 tablet Generic drug: mirabegron ER Take 50 mg by mouth daily.   nystatin ointment Commonly known as: MYCOSTATIN Apply topically 2 (two) times daily.   potassium chloride 10 MEQ tablet Commonly known as: KLOR-CON Take 2 tablets (20 mEq total) by mouth daily. What changed: how much to take   QUEtiapine 25 MG tablet Commonly known as: SEROQUEL Take 1  tablet (25 mg total) by mouth at bedtime.   sertraline 100 MG tablet Commonly known as: ZOLOFT Take 100 mg by mouth every morning.   telmisartan 80 MG tablet Commonly known as: MICARDIS Take 80 mg by mouth daily.   vitamin C 1000 MG tablet Take 0.5 tablets (500 mg total) by mouth daily. What changed: how much to take   VITAMIN E PO Take 1 capsule by mouth once a week.        Allergies  Allergen Reactions   Rosuvastatin Calcium Other (See Comments)    myalgias  Statins Other (See Comments)    myalgia   Amoxicillin Rash   Atorvastatin Other (See Comments)    Back pain    Ezetimibe-Simvastatin Other (See Comments)    Back pain    Penicillins Itching, Swelling and Rash    Did it involve swelling of the face/tongue/throat, SOB, or low BP? No Did it involve sudden or severe rash/hives, skin peeling, or any reaction on the inside of your mouth or nose? Yes Did you need to seek medical attention at a hospital or doctor's office? No When did it last happen?   young adult    If all above answers are "NO", may proceed with cephalosporin use.    Consultations: Psychiatry   Procedures/Studies: CT HEAD WO CONTRAST ( )  Result Date: 04/29/2022 CLINICAL DATA:  Altered mental status. EXAM: CT HEAD WITHOUT CONTRAST TECHNIQUE: Contiguous axial images were obtained from the base of the skull through the vertex without intravenous contrast. RADIATION DOSE REDUCTION: This exam was performed according to the departmental dose-optimization program which includes automated exposure control, adjustment of the mA and/or kV according to patient size and/or use of iterative reconstruction technique. COMPARISON:  Head CT dated 03/06/2022. FINDINGS: Brain: There is mild age-related atrophy and moderate chronic microvascular ischemic changes. There is no acute intracranial hemorrhage. No mass effect or midline shift. No extra-axial fluid collection. Vascular: No hyperdense vessel or unexpected  calcification. Skull: Normal. Negative for fracture or focal lesion. Sinuses/Orbits: No acute finding. Other: None IMPRESSION: 1. No acute intracranial pathology. 2. Age-related atrophy and chronic microvascular ischemic changes. Electronically Signed   By: Elgie Collard M.D.   On: 04/29/2022 21:40   DG Chest 2 View  Result Date: 04/29/2022 CLINICAL DATA:  Chest discomfort.  Altered mental status. EXAM: CHEST - 2 VIEW COMPARISON:  01/25/2022 FINDINGS: Stable cardiomegaly. There is slight increased interstitial thickening. No focal airspace disease. No pleural effusion. No pneumothorax. Stable osseous structures. IMPRESSION: Unchanged cardiomegaly. Slight increased interstitial thickening may represent pulmonary edema. Electronically Signed   By: Narda Rutherford M.D.   On: 04/29/2022 21:03   VAS [place] ABI WITH/WO TBI  Result Date: 04/11/2022  LOWER EXTREMITY DOPPLER STUDY Patient Name:  KELLEEN STOLZE  Date of Exam:   04/11/2022 Medical Rec #: [id number]        Accession #:    [id number] Date of Birth: [birth year]        Patient Gender: F Patient Age:   39 years Exam Location:  [facility] Procedure:      VAS [place] ABI WITH/WO TBI Referring Phys: Ivin Booty ROBINS --------------------------------------------------------------------------------  Indications: From office note on 03/12/22: pt does not have palpable pedal pulses              but does have + doppler flow bilaterallyWill also get ABI at that              time as her pedal pulses were not palpable but doppler flow was              present. Will get these as baseline if she ever needs unna boots in              the future. High Risk Factors: Hypertension, hyperlipidemia, Diabetes.  Limitations: Today's exam was limited due to patient intolerant to cuff pressure              and Body habitus. Performing Technologist: Thereasa Parkin RVT  Examination Guidelines: A complete evaluation includes at minimum, Doppler waveform signals and  systolic blood  pressure reading at the level of bilateral brachial, anterior tibial, and posterior tibial arteries, when vessel segments are accessible. Bilateral testing is considered an integral part of a complete examination. Photoelectric Plethysmograph (PPG) waveforms and toe systolic pressure readings are included as required and additional duplex testing as needed. Limited examinations for reoccurring indications may be performed as noted.  ABI Findings: +---------+------------------+-----+---------+---------------------------------+ Right    Rt Pressure (mmHg)IndexWaveform Comment                           +---------+------------------+-----+---------+---------------------------------+ Brachial 173                                                               +---------+------------------+-----+---------+---------------------------------+ PTA                             triphasicPressure not obtained due to pain +---------+------------------+-----+---------+---------------------------------+ DP       255               1.47 triphasic                                  +---------+------------------+-----+---------+---------------------------------+ Great Toe181               1.05                                            +---------+------------------+-----+---------+---------------------------------+ +---------+-----------------+-----+-----------+--------------------------------+ Left     Lt Pressure      IndexWaveform   Comment                                   (mmHg)                                                            +---------+-----------------+-----+-----------+--------------------------------+ PTA                            multiphasicPressure not obtained due to                                               pain                             +---------+-----------------+-----+-----------+--------------------------------+ DP       247              1.43  multiphasic                                 +---------+-----------------+-----+-----------+--------------------------------+ [place]  Toe121              0.70                                             +---------+-----------------+-----+-----------+--------------------------------+ +-------+-----------+-----------+------------+------------+ ABI/TBIToday's ABIToday's TBIPrevious ABIPrevious TBI +-------+-----------+-----------+------------+------------+ Right  [place]         1.05                                +-------+-----------+-----------+------------+------------+ Left   [place]         0.7                                 +-------+-----------+-----------+------------+------------+ No previous ABI.  Summary: Right: Resting right ankle-brachial index indicates noncompressible right lower extremity arteries. The right toe-brachial index is normal. Left: Resting left ankle-brachial index indicates noncompressible left lower extremity arteries. The left toe-brachial index is normal. *See table(s) above for measurements and observations.  Electronically signed by Gerarda Fraction on 04/11/2022 at 5:06:17 PM.    Final    VAS [place] LOWER EXTREMITY VENOUS REFLUX  Result Date: 04/11/2022  Lower Venous Reflux Study Patient Name:  LUNDON ROSIER  Date of Exam:   04/11/2022 Medical Rec #: [id number]        Accession #:    [id number] Date of Birth: 12/01/1940        Patient Gender: F Patient Age:   89 years Exam Location:  [facility] Procedure:      VAS [place] LOWER EXTREMITY VENOUS REFLUX Referring Phys: Ivin Booty ROBINS --------------------------------------------------------------------------------  Indications: Edema. Other Indications: Office note 03/12/22:                    -pt does not have evidence of DVT. Pt does have venous reflux                    in the left GSV in the proximal and mid thigh but not at the                    [facility] or in the deep system. She is not a candidate for laser                     ablation.                    -will have her return and have RLE venous reflux study. Will                    also get ABI at that time as her pedal pulses were not                    palpable but doppler flow was present. Will get these as                    baseline if she ever needs unna boots in the future. After                    duplex at next visit, she may be candidate for lymphedema  pumps. Limitations: Body habitus and edema. Performing Technologist: Thereasa Parkin RVT  Examination Guidelines: A complete evaluation includes B-mode imaging, spectral Doppler, color Doppler, and power Doppler as needed of all accessible portions of each vessel. Bilateral testing is considered an integral part of a complete examination. Limited examinations for reoccurring indications may be performed as noted. The reflux portion of the exam is performed with the patient in reverse Trendelenburg. Significant venous reflux is defined as >500 ms in the superficial venous system, and >1 second in the deep venous system.  Venous Reflux Times +--------------+---------+------+-----------+------------+--------+ RIGHT         Reflux NoRefluxReflux TimeDiameter cmsComments                         Yes                                  +--------------+---------+------+-----------+------------+--------+ CFV                     yes   >1 second                      +--------------+---------+------+-----------+------------+--------+ FV prox       no                                             +--------------+---------+------+-----------+------------+--------+ FV mid        no                                             +--------------+---------+------+-----------+------------+--------+ FV dist       no                                             +--------------+---------+------+-----------+------------+--------+ Popliteal     no                                              +--------------+---------+------+-----------+------------+--------+ GSV at SFJ    no                           0.637             +--------------+---------+------+-----------+------------+--------+ GSV prox thighno                           0.438             +--------------+---------+------+-----------+------------+--------+ GSV mid thigh no                           0.431             +--------------+---------+------+-----------+------------+--------+ GSV dist thighno                            0.35             +--------------+---------+------+-----------+------------+--------+  GSV at knee   no                           0.219             +--------------+---------+------+-----------+------------+--------+ SSV Pop Fossa no                           0.349             +--------------+---------+------+-----------+------------+--------+ SSV mid calf                               0.311             +--------------+---------+------+-----------+------------+--------+   Summary: Right: - No evidence of deep vein thrombosis seen in the right lower extremity, from the common femoral through the popliteal veins. - No evidence of superficial venous thrombosis in the right lower extremity. - Venous reflux is noted in the right common femoral vein. - No evidence of superficial vein reflux.  *See table(s) above for measurements and observations. Electronically signed by Gerarda Fraction on 04/11/2022 at 5:05:44 PM.    Final       Subjective: Significant events overnight, she does report some congestion, otherwise she had a good night sleep  Discharge Exam: Vitals:   05/05/22 0937 05/05/22 1106  BP: (!) 150/77 (!) 149/98  Pulse: 100 86  Resp: 18 18  Temp: 98.6 F (37 C) 98.8 F (37.1 C)  SpO2: 95% 97%   Vitals:   05/05/22 0434 05/05/22 0549 05/05/22 0937 05/05/22 1106  BP: (!) 175/97 (!) 172/93 (!) 150/77 (!) 149/98  Pulse: 91  100 86  Resp: 18  18 18   Temp: 98.5 F  (36.9 C) 98.4 F (36.9 C) 98.6 F (37 C) 98.8 F (37.1 C)  TempSrc: Oral Oral Oral Oral  SpO2: 99%  95% 97%  Weight:      Height:        General: Pt is alert, awake, not in acute distress, she appears to be with impaired judgment and insight Cardiovascular: irregular , S1/S2 +, no rubs, no gallops Respiratory: CTA bilaterally, no wheezing, no rhonchi Abdominal: Soft, NT, ND, bowel sounds + Extremities:+1  edema, no cyanosis    The results of significant diagnostics from this hospitalization (including imaging, microbiology, ancillary and laboratory) are listed below for reference.     Microbiology: Recent Results (from the past 240 hour(s))  SARS Coronavirus 2 by RT PCR (hospital order, performed in [facility] hospital lab) *cepheid single result test* Anterior Nasal Swab     Status: None   Collection Time: 04/29/22  8:17 PM   Specimen: Anterior Nasal Swab  Result Value Ref Range Status   SARS Coronavirus 2 by RT PCR NEGATIVE NEGATIVE Final    Comment: (NOTE) SARS-CoV-2 target nucleic acids are NOT DETECTED.  The SARS-CoV-2 RNA is generally detectable in upper and lower respiratory specimens during the acute phase of infection. The lowest concentration of SARS-CoV-2 viral copies this assay can detect is 250 copies / mL. A negative result does not preclude SARS-CoV-2 infection and should not be used as the sole basis for treatment or other patient management decisions.  A negative result may occur with improper specimen collection / handling, submission of specimen other than nasopharyngeal swab, presence of viral mutation(s) within the areas targeted by this assay, and inadequate number of viral  copies (<250 copies / mL). A negative result must be combined with clinical observations, patient history, and epidemiological information.  Fact Sheet for Patients:   RoadLapTop.co.za  Fact Sheet for Healthcare  Providers: http://kim-miller.com/  This test is not yet approved or  cleared by the [place] FDA and has been authorized for detection and/or diagnosis of SARS-CoV-2 by FDA under an Emergency Use Authorization (EUA).  This EUA will remain in effect (meaning this test can be used) for the duration of the COVID-19 declaration under Section 564(b)(1) of the Act, 21 U.S.C. section 360bbb-3(b)(1), unless the authorization is terminated or revoked sooner.  Performed at [facility], [address], [place], [place] [address]   Urine Culture     Status: Abnormal   Collection Time: 04/29/22 10:53 PM   Specimen: Urine, Clean Catch  Result Value Ref Range Status   Specimen Description URINE, CLEAN CATCH  Final   Special Requests   Final    NONE Performed at [facility], [address], [place], [place] [address]    Culture >=100,000 COLONIES/mL KLEBSIELLA PNEUMONIAE (A)  Final   Report Status 05/02/2022 FINAL  Final   Organism ID, Bacteria KLEBSIELLA PNEUMONIAE (A)  Final      Susceptibility   Klebsiella pneumoniae - MIC*    AMPICILLIN >=32 RESISTANT Resistant     CEFAZOLIN <=4 SENSITIVE Sensitive     CEFEPIME <=0.12 SENSITIVE Sensitive     CEFTRIAXONE <=0.25 SENSITIVE Sensitive     CIPROFLOXACIN <=0.25 SENSITIVE Sensitive     GENTAMICIN <=1 SENSITIVE Sensitive     IMIPENEM <=0.25 SENSITIVE Sensitive     NITROFURANTOIN <=16 SENSITIVE Sensitive     TRIMETH/SULFA <=20 SENSITIVE Sensitive     AMPICILLIN/SULBACTAM 4 SENSITIVE Sensitive     PIP/TAZO <=4 SENSITIVE Sensitive     * >=100,000 COLONIES/mL KLEBSIELLA PNEUMONIAE     Labs: BNP (last 3 results) Recent Labs    01/25/22 1857 03/05/22 2153  BNP 322.4* 253.4*   Basic Metabolic Panel: Recent Labs  Lab 04/30/22 0355 05/01/22 0322 05/02/22 0354 05/04/22 0203 05/05/22 0357  NA 133* 134* 136 137 137  K 3.7 3.7 3.8 3.8 3.2*  CL 103 102 103 101 99  CO2 18* 20* GLUCOSE 332*  230* 229* 220* 126*  BUN CREATININE 1.18* 0.99 0.81 0.83 0.72  CALCIUM 9.1 8.7* 8.8* 8.9 8.6*  MG  --   --   --  1.6*  --    Liver Function Tests: No results for input(s): "AST", "ALT", "ALKPHOS", "BILITOT", "PROT", "ALBUMIN" in the last 168 hours. No results for input(s): "LIPASE", "AMYLASE" in the last 168 hours. Recent Labs  Lab 04/29/22 2057  AMMONIA <10   CBC: Recent Labs  Lab 04/29/22 2057 04/30/22 0355  WBC 10.9* 9.2  NEUTROABS 7.8*  --   HGB 11.8* 11.7*  HCT 38.1 37.3  MCV 75.6* 74.9*  PLT 383 378   Cardiac Enzymes: No results for input(s): "CKTOTAL", "CKMB", "CKMBINDEX", "TROPONINI" in the last 168 hours. BNP: Invalid input(s): "POCBNP" CBG: Recent Labs  Lab 05/04/22 1924 05/04/22 2324 05/05/22 0312 05/05/22 0633 05/05/22 1134  GLUCAP 199* 119* 109* 143* 166*   D-Dimer No results for input(s): "DDIMER" in the last 72 hours. Hgb A1c No results for input(s): "HGBA1C" in the last 72 hours. Lipid Profile No results for input(s): "CHOL", "HDL", "LDLCALC", "TRIG", "CHOLHDL", "LDLDIRECT" in the last 72 hours. Thyroid function studies No results for  input(s): "TSH", "T4TOTAL", "T3FREE", "THYROIDAB" in the last 72 hours.  Invalid input(s): "FREET3" Anemia work up No results for input(s): "VITAMINB12", "FOLATE", "FERRITIN", "TIBC", "IRON", "RETICCTPCT" in the last 72 hours. Urinalysis    Component Value Date/Time   COLORURINE YELLOW 04/29/2022 2244   APPEARANCEUR CLOUDY (A) 04/29/2022 2244   LABSPEC 1.028 04/29/2022 2244   PHURINE 5.0 04/29/2022 2244   GLUCOSEU >=500 (A) 04/29/2022 2244   HGBUR SMALL (A) 04/29/2022 2244   BILIRUBINUR NEGATIVE 04/29/2022 2244   KETONESUR 20 (A) 04/29/2022 2244   PROTEINUR 100 (A) 04/29/2022 2244   NITRITE NEGATIVE 04/29/2022 2244   LEUKOCYTESUR MODERATE (A) 04/29/2022 2244   Sepsis Labs Recent Labs  Lab 04/29/22 2057 04/30/22 0355  WBC 10.9* 9.2   Microbiology Recent Results (from the past 240  hour(s))  SARS Coronavirus 2 by RT PCR (hospital order, performed in [facility] hospital lab) *cepheid single result test* Anterior Nasal Swab     Status: None   Collection Time: 04/29/22  8:17 PM   Specimen: Anterior Nasal Swab  Result Value Ref Range Status   SARS Coronavirus 2 by RT PCR NEGATIVE NEGATIVE Final    Comment: (NOTE) SARS-CoV-2 target nucleic acids are NOT DETECTED.  The SARS-CoV-2 RNA is generally detectable in upper and lower respiratory specimens during the acute phase of infection. The lowest concentration of SARS-CoV-2 viral copies this assay can detect is 250 copies / mL. A negative result does not preclude SARS-CoV-2 infection and should not be used as the sole basis for treatment or other patient management decisions.  A negative result may occur with improper specimen collection / handling, submission of specimen other than nasopharyngeal swab, presence of viral mutation(s) within the areas targeted by this assay, and inadequate number of viral copies (<250 copies / mL). A negative result must be combined with clinical observations, patient history, and epidemiological information.  Fact Sheet for Patients:   RoadLapTop.co.za  Fact Sheet for Healthcare Providers: http://kim-miller.com/  This test is not yet approved or  cleared by the [place] FDA and has been authorized for detection and/or diagnosis of SARS-CoV-2 by FDA under an Emergency Use Authorization (EUA).  This EUA will remain in effect (meaning this test can be used) for the duration of the COVID-19 declaration under Section 564(b)(1) of the Act, 21 U.S.C. section 360bbb-3(b)(1), unless the authorization is terminated or revoked sooner.  Performed at [facility], [address], [place], [place] [address]   Urine Culture     Status: Abnormal   Collection Time: 04/29/22 10:53 PM   Specimen: Urine, Clean Catch  Result Value Ref Range  Status   Specimen Description URINE, CLEAN CATCH  Final   Special Requests   Final    NONE Performed at [facility], [address], [place], [place] [address]    Culture >=100,000 COLONIES/mL KLEBSIELLA PNEUMONIAE (A)  Final   Report Status 05/02/2022 FINAL  Final   Organism ID, Bacteria KLEBSIELLA PNEUMONIAE (A)  Final      Susceptibility   Klebsiella pneumoniae - MIC*    AMPICILLIN >=32 RESISTANT Resistant     CEFAZOLIN <=4 SENSITIVE Sensitive     CEFEPIME <=0.12 SENSITIVE Sensitive     CEFTRIAXONE <=0.25 SENSITIVE Sensitive     CIPROFLOXACIN <=0.25 SENSITIVE Sensitive     GENTAMICIN <=1 SENSITIVE Sensitive     IMIPENEM <=0.25 SENSITIVE Sensitive     NITROFURANTOIN <=16 SENSITIVE Sensitive     TRIMETH/SULFA <=20 SENSITIVE Sensitive  AMPICILLIN/SULBACTAM 4 SENSITIVE Sensitive     PIP/TAZO <=4 SENSITIVE Sensitive     * >=100,000 COLONIES/mL KLEBSIELLA PNEUMONIAE     Time coordinating discharge: Over 30 minutes  SIGNED:   Huey Bienenstock, MD  [facility] 05/05/2022, 11:58 AM Pager   If 7PM-7AM, please contact night-coverage [email or web address]

## 2022-05-05 NOTE — TOC Transition Note (Signed)
Transition of Care [facility]) - CM/SW Discharge Note   Patient Details  Name: Amy Wright MRN: [id number] Date of Birth: 07/07/1941  [facility]) CM/SW Contact:  Baldemar Lenis, LCSW Phone Number: 05/05/2022, 1:26 PM   Clinical Narrative:   CSW confirmed with [facility] that bed is available, and MD that patient medically stable. CSW sent discharge information and updated daughter at bedside. Transport arranged with [facility] for next available.  Nurse to call report to [phone].    Final next level of care: Skilled Nursing Facility Barriers to Discharge: Barriers Resolved   Patient Goals and CMS Choice   CMS Medicare.gov Compare Post Acute Care list provided to:: Patient Choice offered to / list presented to : Patient, Adult Children  Discharge Placement              Patient chooses bed at: [facility] Patient to be transferred to facility by: [facility] Name of family member notified: Annice Pih Patient and family notified of of transfer: 05/05/22  Discharge Plan and Services In-house Referral: Clinical Social Work Discharge Planning Services: [employer] [facility] Post Acute Care Choice: Skilled Nursing Facility                               Social Determinants of Health (SDOH) Interventions     Readmission Risk Interventions     No data to display

## 2022-05-05 NOTE — Progress Notes (Signed)
Attempted to call report to [facility], [facility] here to pick up patient

## 2022-05-05 NOTE — Discharge Instructions (Signed)
Follow with Primary MD Angelica Chessman, MD /SNF physician  Get CBC, CMP,y checked  by Primary MD next visit.    Activity: As tolerated with Full fall precautions use walker/cane & assistance as needed   Disposition SNF   Diet: Heart Healthy /carb modified   On your next visit with your primary care physician please Get Medicines reviewed and adjusted.   Please request your Prim.MD to go over all Hospital Tests and Procedure/Radiological results at the follow up, please get all Hospital records sent to your Prim MD by signing hospital release before you go home.   If you experience worsening of your admission symptoms, develop shortness of breath, life threatening emergency, suicidal or homicidal thoughts you must seek medical attention immediately by calling 911 or calling your MD immediately  if symptoms less severe.  You Must read complete instructions/literature along with all the possible adverse reactions/side effects for all the Medicines you take and that have been prescribed to you. Take any new Medicines after you have completely understood and accpet all the possible adverse reactions/side effects.   Do not drive, operating heavy machinery, perform activities at heights, swimming or participation in water activities or provide baby sitting services if your were admitted for syncope or siezures until you have seen by Primary MD or a Neurologist and advised to do so again.  Do not drive when taking Pain medications.    Do not take more than prescribed Pain, Sleep and Anxiety Medications  Special Instructions: If you have smoked or chewed Tobacco  in the last 2 yrs please stop smoking, stop any regular Alcohol  and or any Recreational drug use.  Wear Seat belts while driving.   Please note  You were cared for by a hospitalist during your hospital stay. If you have any questions about your discharge medications or the care you received while you were in the hospital  after you are discharged, you can call the unit and asked to speak with the hospitalist on call if the hospitalist that took care of you is not available. Once you are discharged, your primary care physician will handle any further medical issues. Please note that NO REFILLS for any discharge medications will be authorized once you are discharged, as it is imperative that you return to your primary care physician (or establish a relationship with a primary care physician if you do not have one) for your aftercare needs so that they can reassess your need for medications and monitor your lab values.

## 2022-07-07 ENCOUNTER — Other Ambulatory Visit (HOSPITAL_COMMUNITY): Payer: Self-pay

## 2022-07-16 ENCOUNTER — Other Ambulatory Visit: Payer: Self-pay | Admitting: Internal Medicine

## 2022-07-25 ENCOUNTER — Ambulatory Visit: Payer: Medicare Other | Admitting: Internal Medicine

## 2022-10-09 ENCOUNTER — Other Ambulatory Visit: Payer: Self-pay

## 2022-10-09 MED ORDER — DILTIAZEM HCL ER COATED BEADS 360 MG PO CP24
ORAL_CAPSULE | ORAL | 0 refills | Status: DC
Start: 2022-10-09 — End: 2022-11-20

## 2022-10-26 ENCOUNTER — Other Ambulatory Visit: Payer: Self-pay | Admitting: Cardiovascular Disease

## 2022-10-27 ENCOUNTER — Ambulatory Visit: Payer: Medicare Other | Admitting: Cardiovascular Disease

## 2022-10-27 NOTE — Progress Notes (Deleted)
Electrophysiology Office Note:    Date:  10/27/2022   ID:  Amy Wright, DOB 04/27/1941, MRN [id number]  PCP:  Robyne Peers, MD   [place] Providers Cardiologist:  None { Click to update primary MD,subspecialty MD or APP then REFRESH:1}    Referring MD: Robyne Peers, MD   History of Present Illness:    Amy Wright is a 82 y.o. female with a hx listed below, significant for permanent atrial fibrillation, who presents for follow-up.    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 Central jet. Aortic Valve appears to be mildly sclerotic. Trace aortic regurgitation.EF >55%    Past Surgical History:  Procedure Laterality Date   APPENDECTOMY  1971   CARDIOVERSION N/A 06/23/2013   Procedure: CARDIOVERSION;  Surgeon: Troy Sine, MD;  Location: [facility] ENDOSCOPY;  Service: Cardiovascular;  Laterality: N/A;   CATARACT EXTRACTION W/ INTRAOCULAR LENS IMPLANT Bilateral    CESAREAN SECTION  1971   PLACEMENT OF BREAST IMPLANTS Bilateral 1985    Current Medications: No outpatient medications have been marked as taking for the 10/27/22 encounter (Appointment) with Jaymee Tilson, Yetta Barre, MD.     Allergies:   Rosuvastatin calcium, Statins, Amoxicillin, Atorvastatin, Ezetimibe-simvastatin, and Penicillins   Social History   Socioeconomic History   Marital status: Married    Spouse name: Not on file   Number of children: Not on file   Years of education: Not on file   Highest education level: Not on file  Occupational History   Not on file  Tobacco Use   Smoking status: Never    Passive exposure: Yes   Smokeless tobacco: Never  Vaping Use    Vaping Use: Never used  Substance and Sexual Activity   Alcohol use: Yes    Comment: 08/09/2013 "nothing to drink in >4 yrs; never had a problem w/it"   Drug use: No   Sexual activity: Not Currently  Other Topics Concern   Not on file  Social History Narrative   Not on file   Social Determinants of Health   Financial Resource Strain: Not on file  Food Insecurity: Not on file  Transportation Needs: Not on file  Physical Activity: Not on file  Stress: Not on file  Social Connections: Not on file     Family History: The patient's family history includes Cancer - Lung in her father; Cancer - Other in her maternal grandmother; Cardiomyopathy in her mother; Diabetes in her maternal grandfather; Heart attack in her mother; Stroke in her paternal grandfather.  ROS:   Please see the history of present illness.    All other systems reviewed and are negative.  EKGs/Labs/Other Studies Reviewed Today:     ***  EKG:  Last EKG results: ***   Recent Labs: 03/05/2022: ALT 18; B Natriuretic Peptide 253.4; TSH 4.577 04/30/2022: Hemoglobin 11.7; Platelets 378 05/04/2022: Magnesium 1.6 05/05/2022: BUN 10; Creatinine, Ser 0.72; Potassium 3.2; Sodium 137     Physical Exam:    VS:  There were no vitals taken for this visit.    Wt Readings from Last 3 Encounters:  04/29/22 260  lb 2.3 oz (118 kg)  04/11/22 262 lb (118.8 kg)  03/12/22 259 lb 1.6 oz (117.5 kg)     GEN: *** Well nourished, well developed in no acute distress CARDIAC: ***RRR, no murmurs, rubs, gallops RESPIRATORY:  Normal work of breathing MUSCULOSKELETAL: *** edema    ASSESSMENT & PLAN:    Permanent atrial fibrillation Secondary hypercoagulable state:        Medication Adjustments/Labs and Tests Ordered: Current medicines are reviewed at length with the patient today.  Concerns regarding medicines are outlined above.  No orders of the defined types were placed in this encounter.  No orders of the defined types  were placed in this encounter.    Signed, Melida Quitter, MD  10/27/2022 8:22 AM    [place]

## 2022-11-19 ENCOUNTER — Other Ambulatory Visit: Payer: Self-pay | Admitting: Cardiovascular Disease

## 2022-11-20 ENCOUNTER — Telehealth: Payer: Self-pay | Admitting: Cardiovascular Disease

## 2022-11-20 MED ORDER — DILTIAZEM HCL ER COATED BEADS 360 MG PO CP24
ORAL_CAPSULE | ORAL | 1 refills | Status: DC
Start: 2022-11-20 — End: 2023-01-28

## 2022-11-20 NOTE — Telephone Encounter (Signed)
Pt's medication was sent to pt's pharmacy as requested. Confirmation received.  °

## 2022-11-20 NOTE — Telephone Encounter (Signed)
*  STAT* If patient is at the pharmacy, call can be transferred to refill team.   1. Which medications need to be refilled? (please list name of each medication and dose if known)   diltiazem (CARDIZEM CD) 360 MG 24 hr capsule    2. Which pharmacy/location (including street and city if local pharmacy) is medication to be sent to?   [id number] - [place], [place] [address]    3. Do they need a 30 day or 90 day supply? 90     Pt made an appt for 01/20/23 with Dr. Myles Gip.

## 2022-12-08 ENCOUNTER — Ambulatory Visit: Payer: Medicare Other | Admitting: Internal Medicine

## 2022-12-28 ENCOUNTER — Other Ambulatory Visit: Payer: Self-pay | Admitting: Cardiovascular Disease

## 2023-01-17 ENCOUNTER — Other Ambulatory Visit: Payer: Self-pay | Admitting: Cardiovascular Disease

## 2023-01-20 ENCOUNTER — Ambulatory Visit: Payer: Medicare Other | Admitting: Cardiovascular Disease

## 2023-01-26 ENCOUNTER — Other Ambulatory Visit: Payer: Self-pay | Admitting: Cardiovascular Disease

## 2023-01-28 ENCOUNTER — Telehealth: Payer: Self-pay | Admitting: Cardiovascular Disease

## 2023-01-28 MED ORDER — POTASSIUM CHLORIDE ER 10 MEQ PO TBCR: 20.0000 meq | EXTENDED_RELEASE_TABLET | Freq: Every day | ORAL | 0 refills | Status: DC

## 2023-01-28 MED ORDER — DILTIAZEM HCL ER COATED BEADS 360 MG PO CP24: ORAL_CAPSULE | ORAL | 0 refills | Status: DC

## 2023-01-28 NOTE — Telephone Encounter (Signed)
Pt's medication was sent to pt's pharmacy for a 15 day supply, enough medication to get pt to appt on 02/12/23. Pt has cancelled appt numerous time. Pt needs to be seen first, before a 90 day supply. Confirmation received.

## 2023-01-28 NOTE — Telephone Encounter (Signed)
*  STAT* If patient is at the pharmacy, call can be transferred to refill team.   1. Which medications need to be refilled? (please list name of each medication and dose if known)  diltiazem (CARDIZEM CD) 360 MG 24 hr capsule   2. Which pharmacy/location (including street and city if local pharmacy) is medication to be sent to?  [id number] - [place], [place] - [address]    3. Do they need a 30 day or 90 day supply? 90

## 2023-01-28 NOTE — Addendum Note (Signed)
Addended by: Margaret Pyle D on: 01/28/2023 04:53 PM   Modules accepted: Orders

## 2023-02-09 ENCOUNTER — Other Ambulatory Visit: Payer: Self-pay | Admitting: Cardiovascular Disease

## 2023-02-12 ENCOUNTER — Ambulatory Visit: Payer: Medicare Other | Admitting: Physician Assistant

## 2023-02-21 ENCOUNTER — Other Ambulatory Visit: Payer: Self-pay | Admitting: Cardiovascular Disease

## 2023-03-05 ENCOUNTER — Ambulatory Visit: Payer: Medicare Other | Attending: Cardiovascular Disease | Admitting: Student

## 2023-03-05 ENCOUNTER — Encounter: Payer: Self-pay | Admitting: Student

## 2023-03-05 NOTE — Progress Notes (Deleted)
  Electrophysiology Office Note:   Date:  03/05/2023  ID:  Amy Wright, DOB Jul 13, 1941, MRN [id number]  Primary Cardiologist: None Electrophysiologist: None  {Click to update primary MD,subspecialty MD or APP then REFRESH:1}    History of Present Illness:   Amy Wright is a 82 y.o. female with h/o permanent AF on coumadin, HTN, and OSA seen today for routine electrophysiology followup.   Last seen 02/2021  Since last being seen in our clinic the patient reports doing ***.  she denies chest pain, palpitations, dyspnea, PND, orthopnea, nausea, vomiting, dizziness, syncope, edema, weight gain, or early satiety.   Review of systems complete and found to be negative unless listed in HPI.   Studies Reviewed:    EKG is ordered today. Personal review as below.       ***  Arrhythmia history Permanent AF  On coumadin Previously failed eliquis (severe epistaxis)  Risk Assessment/Calculations:   {Does this patient have ATRIAL FIBRILLATION?:[phone]} No BP recorded.  {Refresh Note OR Click here to enter BP  :1}***        Physical Exam:   VS:  There were no vitals taken for this visit.   Wt Readings from Last 3 Encounters:  04/29/22 260 lb 2.3 oz (118 kg)  04/11/22 262 lb (118.8 kg)  03/12/22 259 lb 1.6 oz (117.5 kg)     GEN: Well nourished, well developed in no acute distress NECK: No JVD; No carotid bruits CARDIAC: {EPRHYTHM:28826}, no murmurs, rubs, gallops RESPIRATORY:  Clear to auscultation without rales, wheezing or rhonchi  ABDOMEN: Soft, non-tender, non-distended EXTREMITIES:  No edema; No deformity   ASSESSMENT AND PLAN:    Permanent atrial fibrillation Rate controlled EKG today shows *** Continue coumadin for CHA2DS2VASc  of at least 5. INRs have been labile, but previously did not tolerate eliquis Have previously discussed Xarelto, pradaxa, and watchman, none of which she wished to pursue.   HTN Stable on current regimen  OSA  Encouraged nightly CPAP    {Click here to Review PMH, Prob List, Meds, Allergies, SHx, FHx  :1}   Follow up with {WUJWJ:19147} {EPFOLLOW WG:95621}  Signed, Graciella Freer, PA-C

## 2023-03-06 IMAGING — DX DG CHEST 1V
1 series · 1 of 1 positions shown · non-contrast
Comparison: Chest radiograph dated 06/20/2013.

CLINICAL DATA: Fall with head and shoulder pain.

EXAM:
CHEST  1 VIEW

[chest ap]
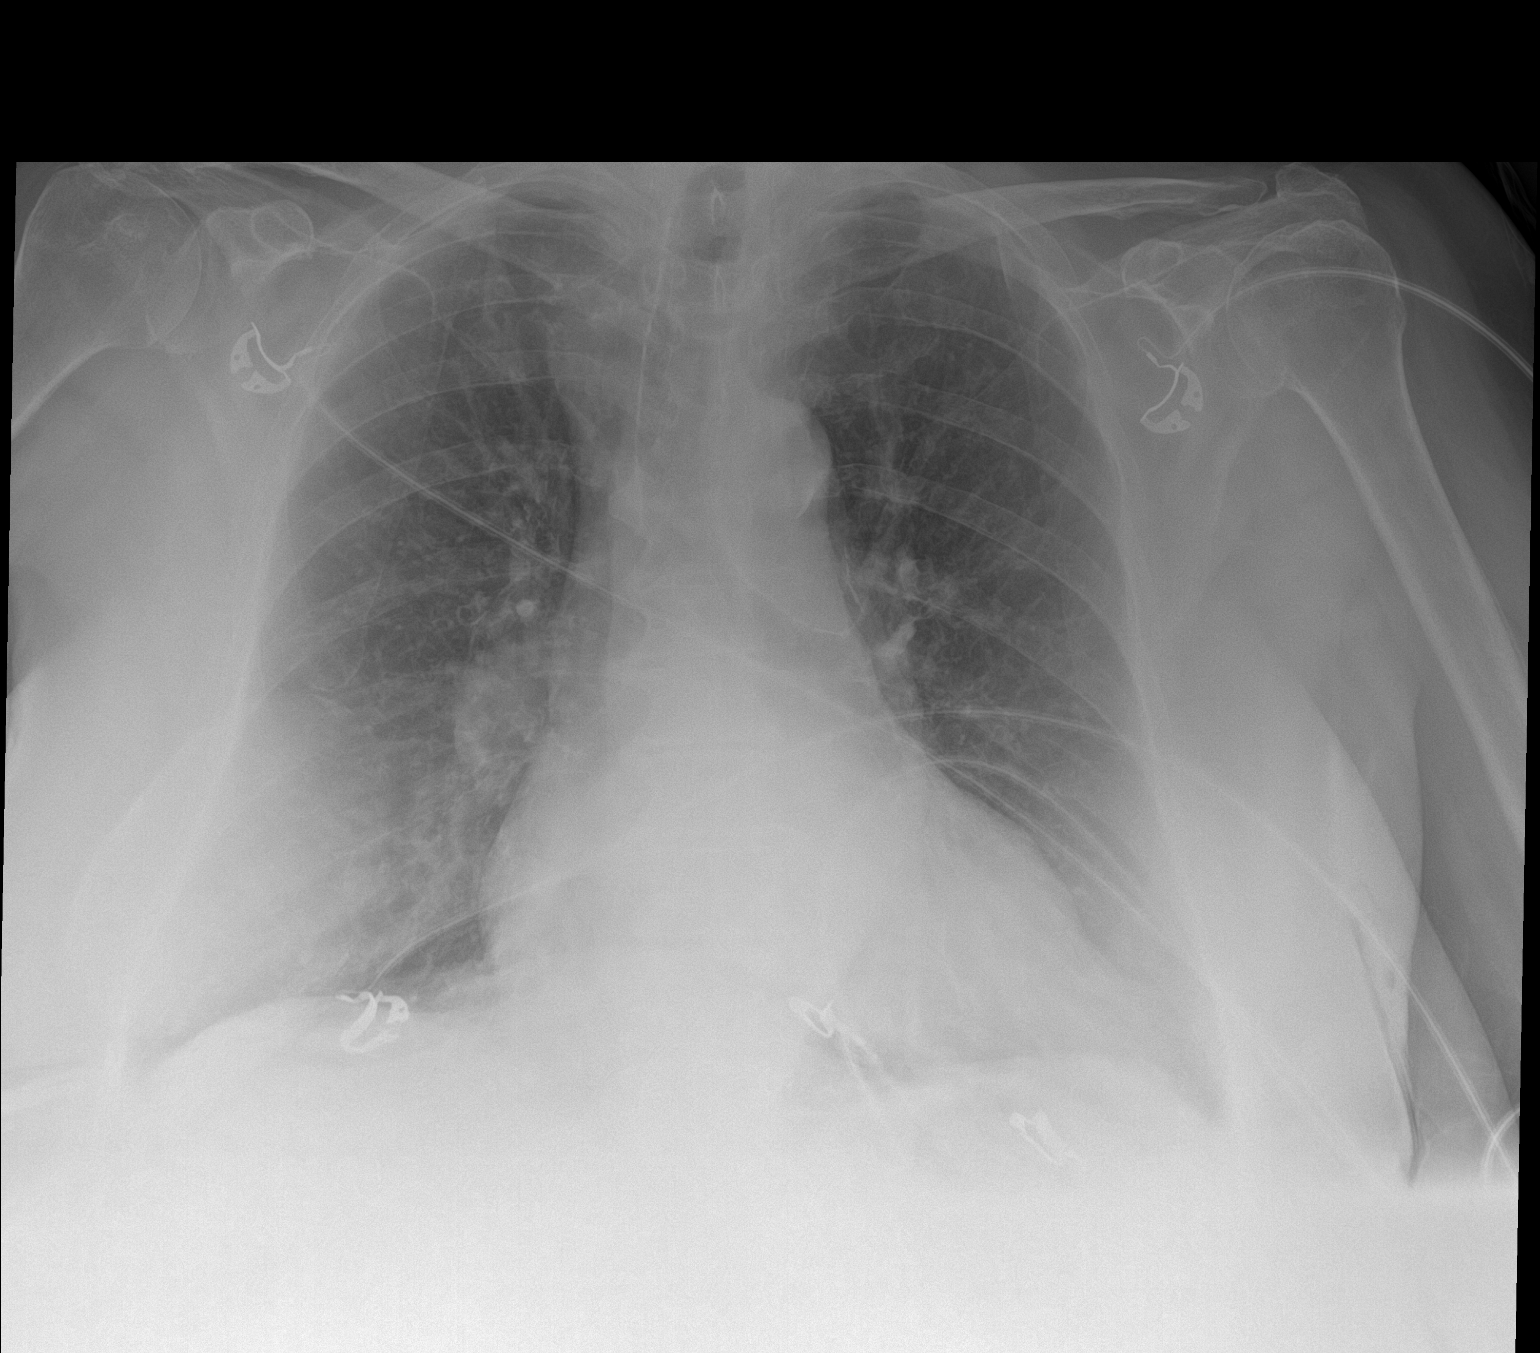

[1 of 1 positions shown; findings below may reference images not displayed]

FINDINGS: The heart is borderline enlarged. Vascular calcifications are seen
in the aortic arch. Both lungs are clear. Degenerative changes are
seen in the spine.
IMPRESSION: No active disease.

## 2023-03-06 IMAGING — CT CT HEAD W/O CM
4 series · 16 of 47 positions shown, 18 images · non-contrast
Comparison: None.

CLINICAL DATA: Fall getting out of bed. Trauma to head. Patient is
on Coumadin.

EXAM:
CT HEAD WITHOUT CONTRAST
TECHNIQUE: Contiguous axial images were obtained from the base of the skull
through the vertex without intravenous contrast.

[Series 2: head wo · axial · 0.44mm/px · z∈[-206,-96]mm · 7 of 30 slices shown, 9 images]
[im 4/30  brain]
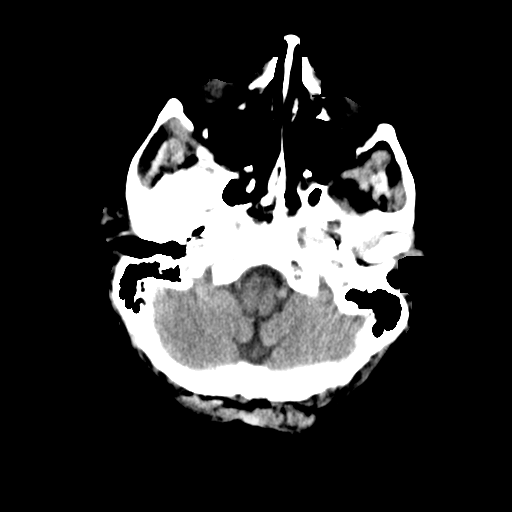
[im 4/30  bone]
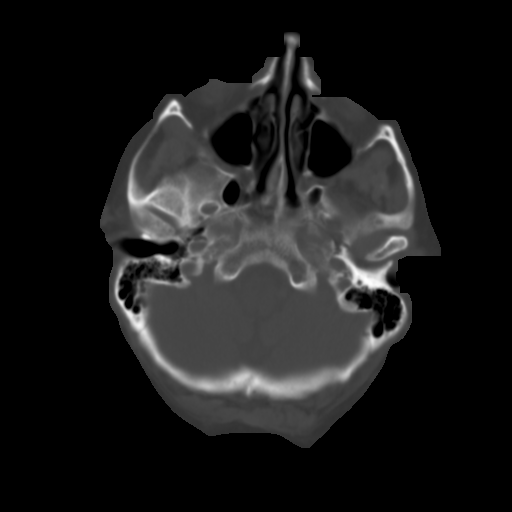
[im 8/30  brain]
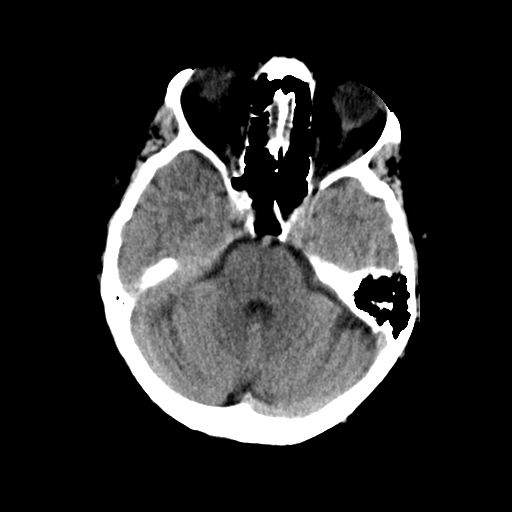
[im 11/30  brain]
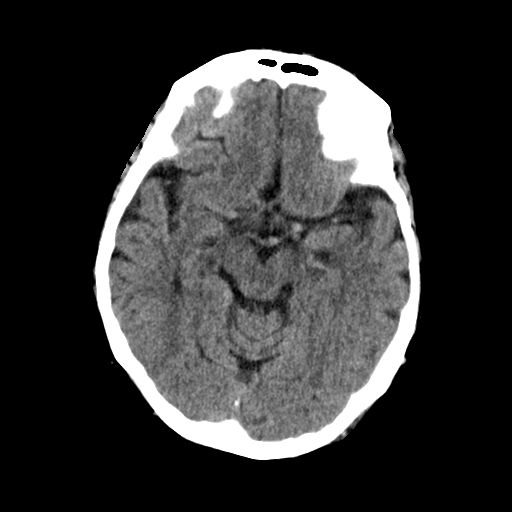
[im 15/30  brain]
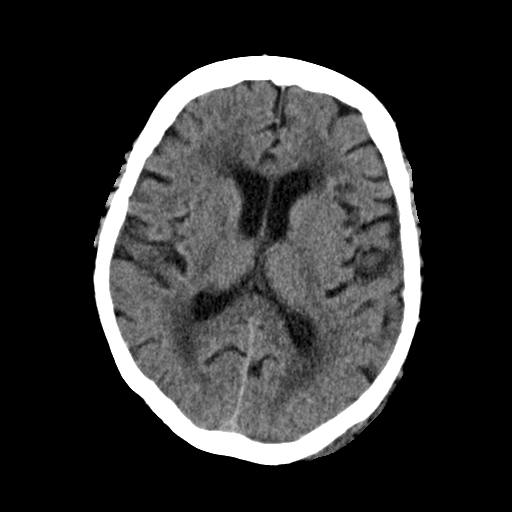
[im 19/30  brain]
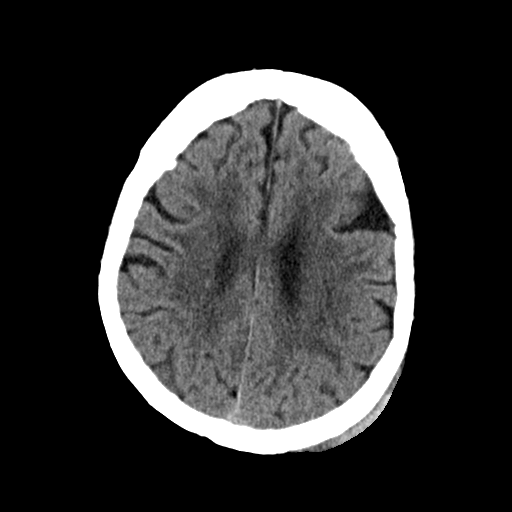
[im 19/30  bone]
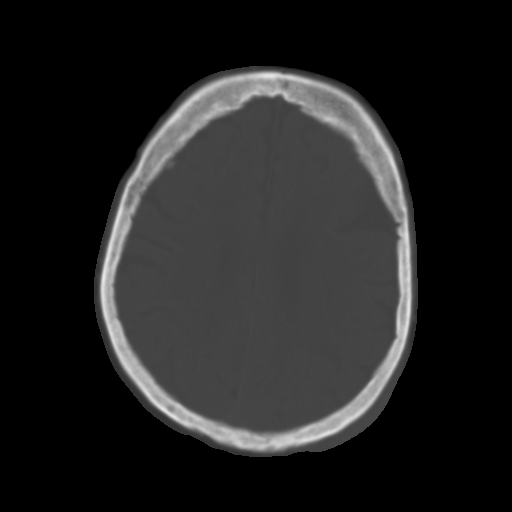
[im 22/30  brain]
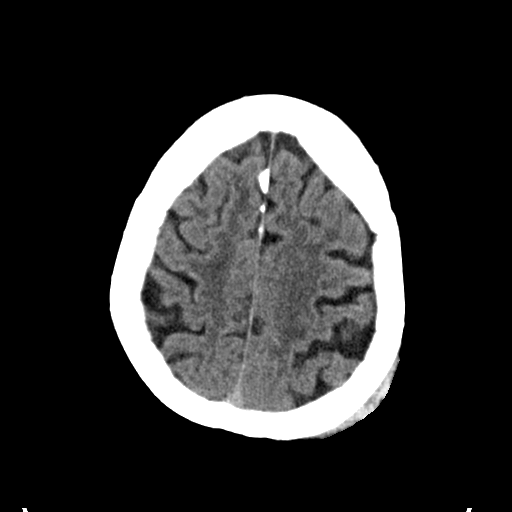
[im 26/30  brain]
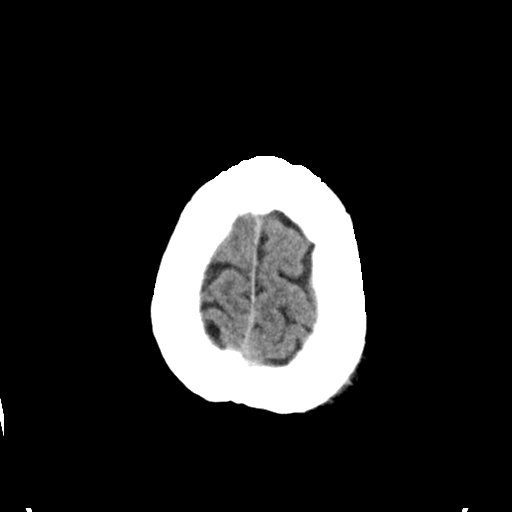

[Series 3: head bone · axial · 0.44mm/px · z∈[-207,-177]mm · 3 of 75 slices shown]
[im 8/75  bone]
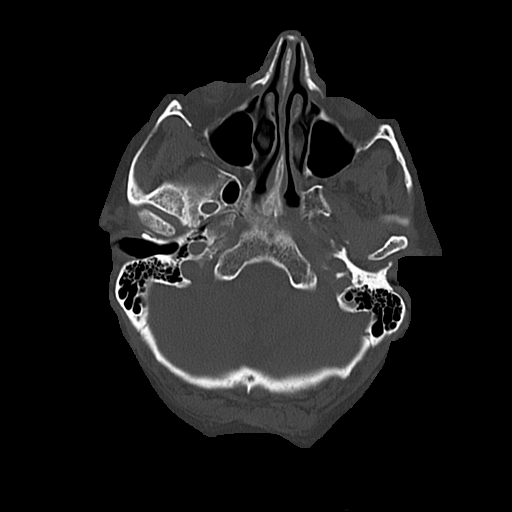
[im 15/75  bone]
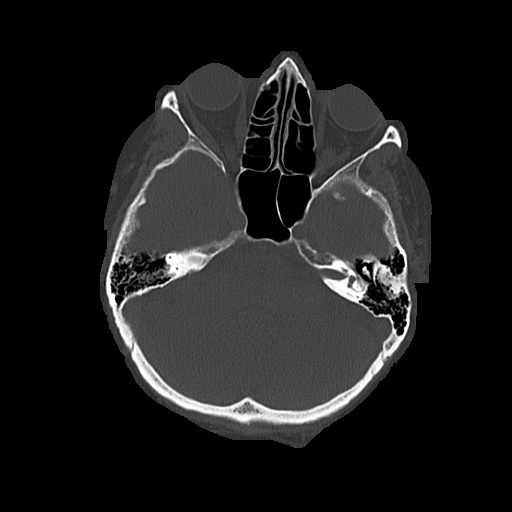
[im 23/75  bone]
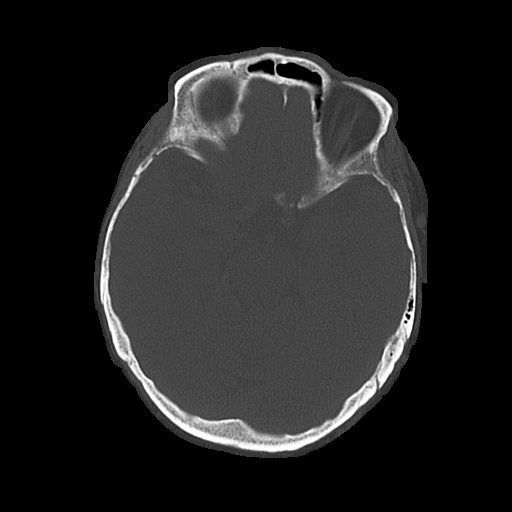

[Series 4: coronal soft · coronal · 0.31mm/px · 3 of 65 slices shown]
[im 22/65  brain]
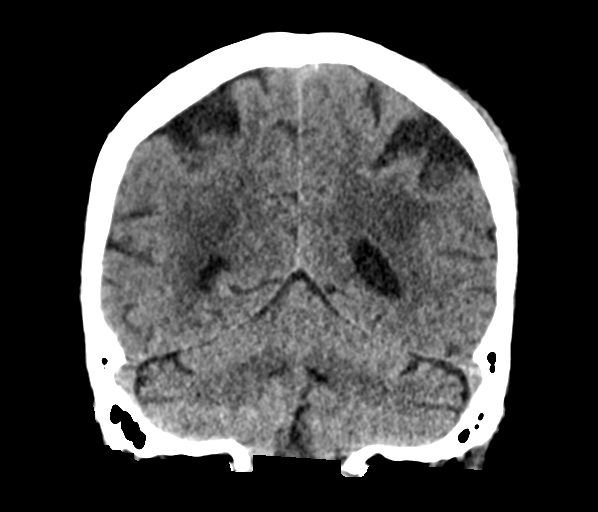
[im 29/65  brain]
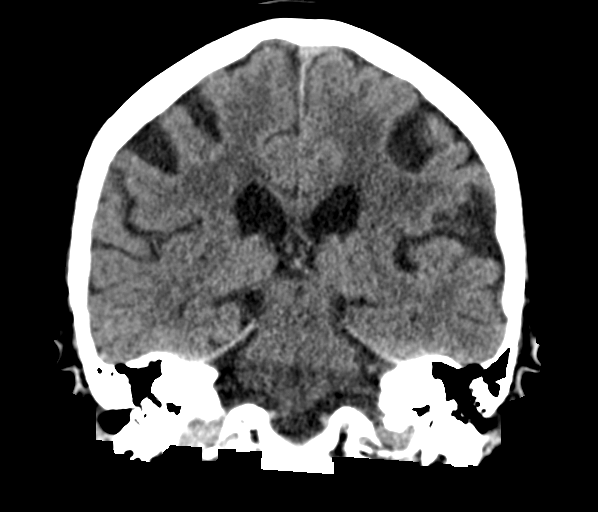
[im 36/65  brain]
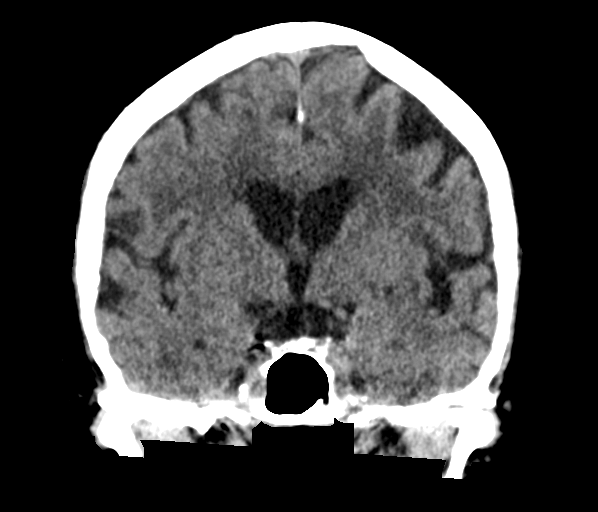

[Series 5: sagittal soft · sagittal · 0.31mm/px · 3 of 58 slices shown]
[im 20/58  brain]
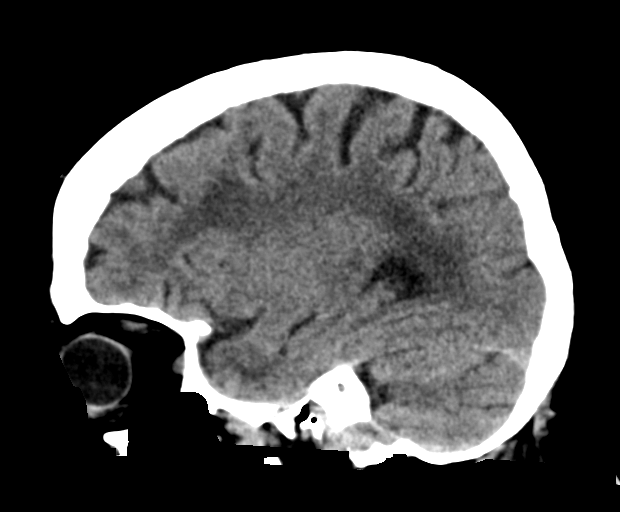
[im 29/58  brain]
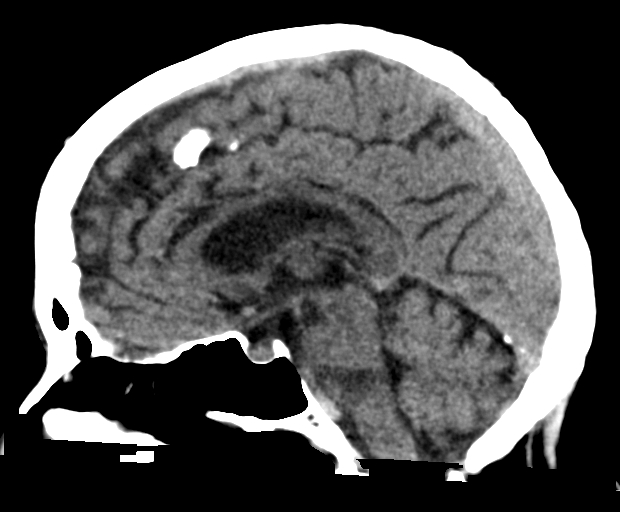
[im 39/58  brain]
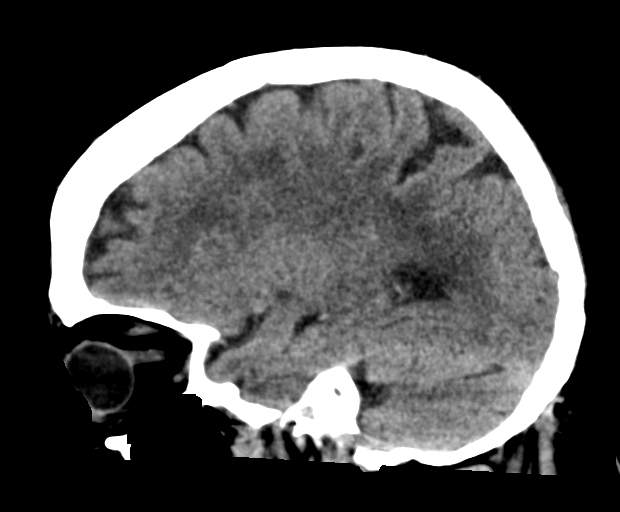

[16 of 47 positions shown; findings below may reference images not displayed]

FINDINGS: Brain: Moderate atrophy and white matter disease is present
bilaterally. No acute infarct, hemorrhage, or mass lesion is
present. Remote lacunar infarcts are present in the right internal
capsule and thalamus. No significant extraaxial fluid collection is
present.

Vascular: Atherosclerotic changes are present in the cavernous
internal carotid arteries. No hyperdense vessel is present.

Skull: Calvarium is intact. No focal lytic or blastic lesions are
present. Left parietal scalp hematoma is present without underlying
fracture.

Sinuses/Orbits: The paranasal sinuses and mastoid air cells are
clear. Bilateral lens replacements are noted. Globes and orbits are
otherwise unremarkable.
IMPRESSION: 1. Left parietal scalp hematoma without underlying fracture.
2. No acute intracranial abnormality.
3. Moderate atrophy and white matter disease likely reflects the
sequela of chronic microvascular ischemia.

## 2023-03-06 IMAGING — DX DG SHOULDER 2+V*L*
3 series · 3 of 3 positions shown · non-contrast
Comparison: Chest radiograph dated 06/20/2013.

CLINICAL DATA: Fall with left shoulder pain.

EXAM:
LEFT SHOULDER - 2+ VIEW

[shoulder grashey]
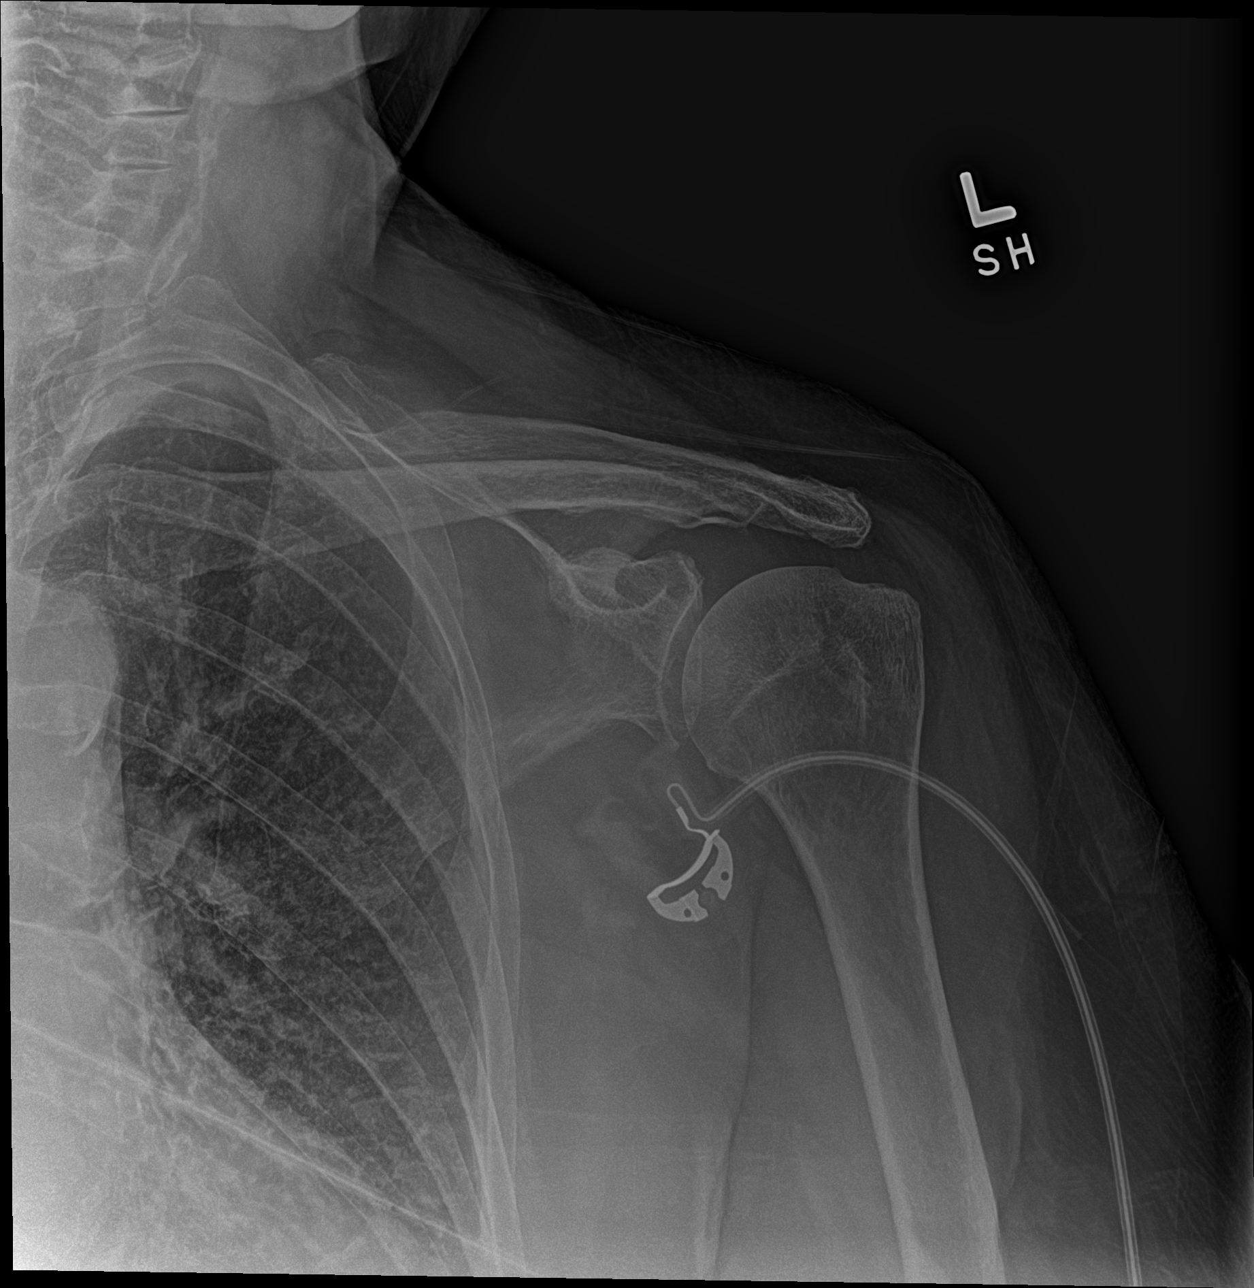

[shoulder y view]
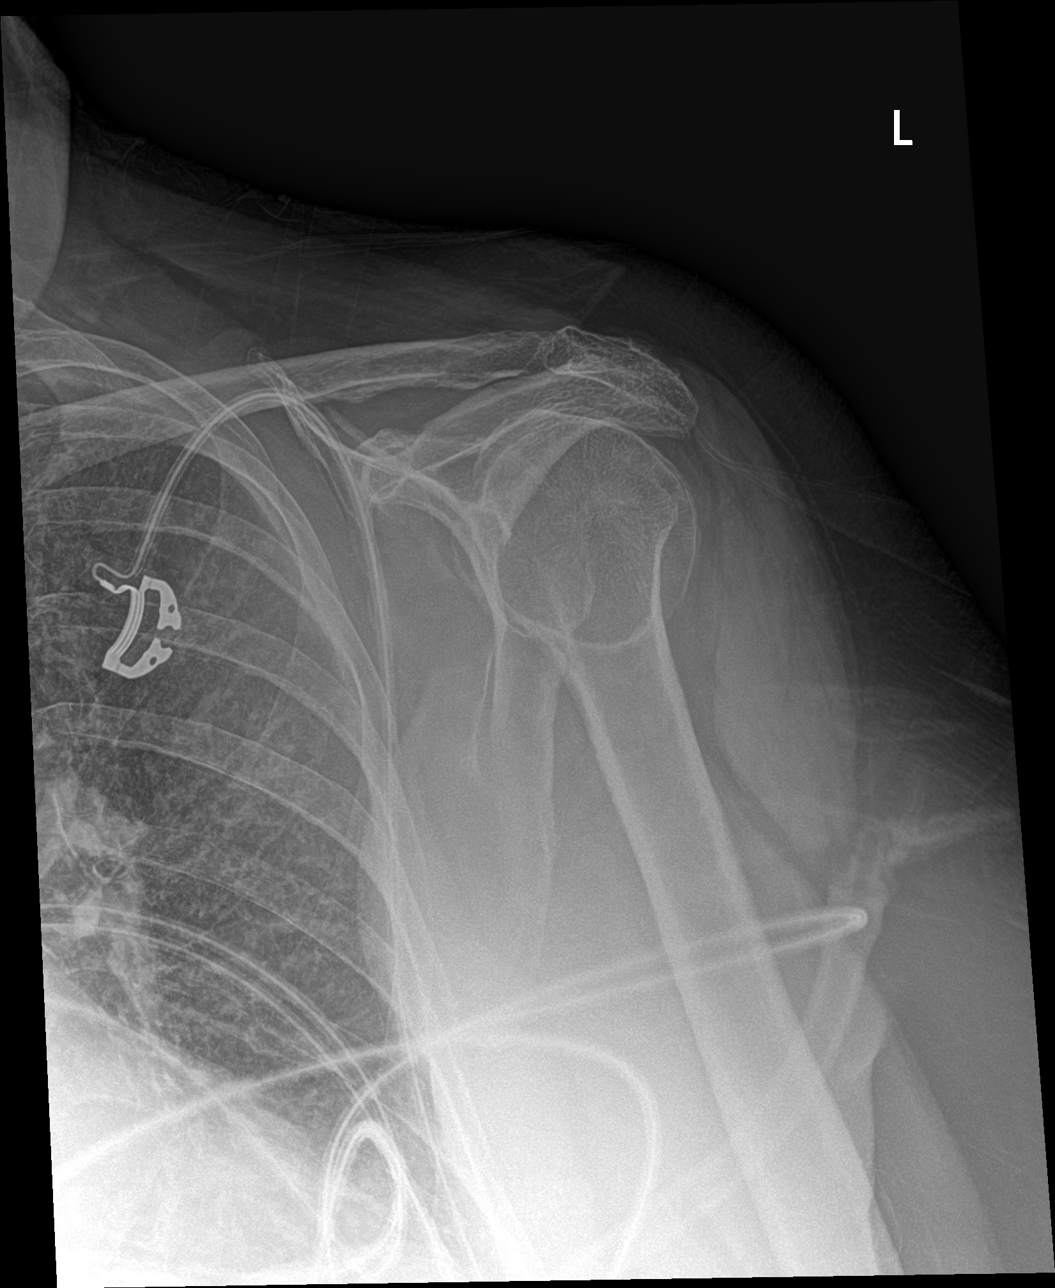

[shoulder axillary]
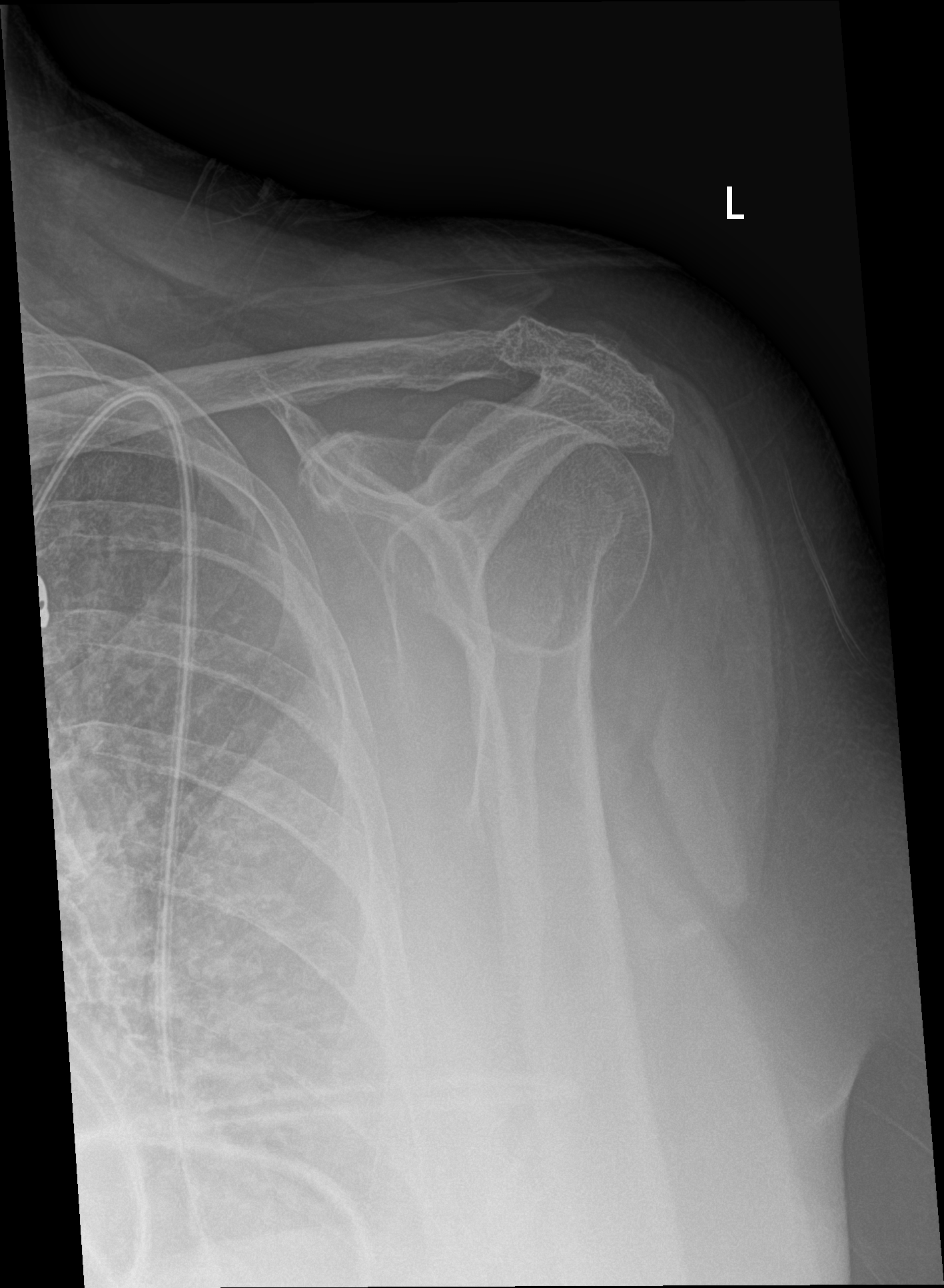

[3 of 3 positions shown; findings below may reference images not displayed]

FINDINGS: There is no evidence of fracture or dislocation. Mild degenerative
changes are seen at the acromioclavicular and glenohumeral joints.
Soft tissues are unremarkable.
IMPRESSION: No acute osseous injury.

## 2023-03-10 ENCOUNTER — Other Ambulatory Visit: Payer: Self-pay | Admitting: Cardiovascular Disease

## 2023-03-13 ENCOUNTER — Telehealth: Payer: Self-pay | Admitting: Cardiovascular Disease

## 2023-03-13 NOTE — Telephone Encounter (Signed)
*  STAT* If patient is at the pharmacy, call can be transferred to refill team.   1. Which medications need to be refilled? (please list name of each medication and dose if known)   diltiazem (CARDIZEM CD) 360 MG 24 hr capsule    2. Which pharmacy/location (including street and city if local pharmacy) is medication to be sent to? [id number] [place], [place] - [address]    3. Do they need a 30 day or 90 day supply? 90 day

## 2023-03-13 NOTE — Telephone Encounter (Signed)
Pt is requesting a refill on diltiazem. Pt has not been seen since 2022 and has cancelled numerous appts. Pt has an appt for August 2024. Would Dr. Nelly Laurence like to refill this medication again for the pt until appt time in August 2024? Please address

## 2023-03-14 ENCOUNTER — Other Ambulatory Visit: Payer: Self-pay | Admitting: Cardiovascular Disease

## 2023-03-17 NOTE — Telephone Encounter (Signed)
Called pt and left message informing pt that she needed to be seen first before refill her medication and if she needed medication, she needed to be seen sooner, to call back to the office to get a sooner appt. I advised the pt that if she has any other problems, questions or concerns, to give our office a call back.

## 2023-03-17 NOTE — Telephone Encounter (Signed)
Pt is requesting a refill on diltiazem and metoprol. Pt has not been seen since 2022 and has cancelled numerous appts. Pt has an appt for August 2024. Would Dr. Nelly Laurence like to refill these medications again for the pt until appt time in August 2024? Please address

## 2023-03-18 ENCOUNTER — Ambulatory Visit: Payer: Medicare Other | Admitting: Cardiovascular Disease

## 2023-03-20 ENCOUNTER — Other Ambulatory Visit: Payer: Self-pay | Admitting: Cardiology

## 2023-03-20 ENCOUNTER — Other Ambulatory Visit: Payer: Self-pay | Admitting: Cardiovascular Disease

## 2023-03-20 MED ORDER — DILTIAZEM HCL ER COATED BEADS 360 MG PO CP24: ORAL_CAPSULE | ORAL | 0 refills | Status: DC

## 2023-03-20 NOTE — Telephone Encounter (Signed)
   Patient called this evening requesting a refill of her cardizem. She states that she ran out of this medication yesterday. A review of medication history shows that patient was given 30 day Rx for this on 02/10/23 with statement on Rx that this was final fill due to no recent follow ups. Patient appears to have called for a refill on/around 7/5 with request denied. Notes state "Dr Nelly Laurence does NOT want to refill diltiazem for this patient - she needs to keep August 15th appt (she has cancelled/missed 5 appts with EP in a row)." Discussed this with patient who became very agitated with the situation. She reported "feeling bad" because of not having the medication to take. Given hx of afib, I found this concerning but patient would not discuss symptoms with me and reiterated request for medication.   Patient requested to speak with a cardiac MD and I had Dr. Orson Aloe (cardiology moonlighter for [facility]) speak with patient as well. Both myself and Dr. Orson Aloe reviewed with patient the concern that cardiac medications require regular follow up from safety standpoint. Her last in person medical evaluation appears to have been with PCP at [facility] on 10/09/2022 but she has not been seen by a [facility] provider since 02/27/21 and there is no ECG visible since 04/29/22. Patient is currently scheduled for follow up with [facility] on August 15th. Discussed with patient that we would provide 5 days worth of cardizem to give patient time to either see her PCP for further refills (given more recent evaluation by them) or contact [facility] office for sooner follow up.   Perlie Gold, PA-C

## 2023-03-20 NOTE — Progress Notes (Signed)
See telephone encounter.

## 2023-04-13 ENCOUNTER — Other Ambulatory Visit: Payer: Self-pay | Admitting: *Deleted

## 2023-04-13 MED ORDER — DILTIAZEM HCL ER COATED BEADS 360 MG PO CP24: ORAL_CAPSULE | ORAL | 0 refills | Status: DC

## 2023-04-23 ENCOUNTER — Encounter: Payer: Self-pay | Admitting: Physician Assistant

## 2023-04-23 ENCOUNTER — Ambulatory Visit: Payer: Medicare Other | Attending: Cardiovascular Disease | Admitting: Physician Assistant

## 2023-04-23 VITALS — BP 132/90 | HR 76 | Ht 65.0 in | Wt 205.9 lb

## 2023-04-23 DIAGNOSIS — D6869 Other thrombophilia: Secondary | ICD-10-CM | POA: Diagnosis not present

## 2023-04-23 DIAGNOSIS — I4821 Permanent atrial fibrillation: Secondary | ICD-10-CM | POA: Diagnosis not present

## 2023-04-23 DIAGNOSIS — I10 Essential (primary) hypertension: Secondary | ICD-10-CM

## 2023-04-23 MED ORDER — APIXABAN 5 MG PO TABS: 5.0000 mg | ORAL_TABLET | Freq: Two times a day (BID) | ORAL | 3 refills | Status: DC

## 2023-04-23 MED ORDER — DILTIAZEM HCL ER COATED BEADS 360 MG PO CP24: ORAL_CAPSULE | ORAL | 3 refills | Status: DC

## 2023-04-23 MED ORDER — TELMISARTAN 80 MG PO TABS: 80.0000 mg | ORAL_TABLET | Freq: Every day | ORAL | 3 refills | Status: AC

## 2023-04-23 MED ORDER — POTASSIUM CHLORIDE ER 10 MEQ PO TBCR: 20.0000 meq | EXTENDED_RELEASE_TABLET | Freq: Every day | ORAL | 3 refills | Status: AC

## 2023-04-23 MED ORDER — FUROSEMIDE 40 MG PO TABS: 40.0000 mg | ORAL_TABLET | Freq: Two times a day (BID) | ORAL | 3 refills | Status: DC

## 2023-04-23 MED ORDER — METOPROLOL SUCCINATE ER 50 MG PO TB24: 150.0000 mg | ORAL_TABLET | Freq: Every day | ORAL | 3 refills | Status: DC

## 2023-04-23 NOTE — Progress Notes (Signed)
Cardiology Office Note Date:  04/23/2023  Patient ID:  Amy Wright, Strain 1941/07/03, MRN [id number] PCP:  Angelica Chessman, MD  Electrophysiologist:  Dr. Johney Frame    Chief Complaint:    over due visit  History of Present Illness: Amy Wright is a 82 y.o. female with history of HTN, HLD, DM, anxiety, and AFib.  She saw Dr. Johney Frame, April 2019, at that time on Tikosyn, having some paroxysms of Afib, and planned for DCCV.  The pt had not wanted to consider ablation or alternative AAD at that time.  Aug 2020 the patient reached out wanting to switch from coumadin to Eliquis (note mentions coumadin was no longer bing made, presumably branded)  She developed nose bleeds with Eliquis, followed with ENT >> warfarin.  She saw Dr. Johney Frame via Telehealth visit Jan 2021.  Discussed prior failure with Tikosyn and pt did not want to consider amiodarone (her sister having a bad experience with the drug), not an ablation candidate and planned for rate control strategies, her Toprol increased. Mentioned could consider surgical referral for MAZE/LAA ligation though the pt wanted to avoid that.  I saw her 12/10/20 She feels tired no energy. She mentions that in the last several weeks wakes after sleeping all night gets up and does a few things and once seated again for any length of time falls asleep for hours again. She stopped using CPAP probably 3 years ago, read bad things about the machines and felt like she struggled with it more then she slept with it. Not dizzy or lightheaded, but tired.  No CP, mentions that "since I have put on this weight" get winded very easily. No rest SOB She has marked brawny type edema that she tells me is not new for her and has a PRN lasix (not noted on our med list), prescription via her PMD that she uses some weeks and not others, her PMD having given her instructions, She was uncertain of the strength of the pills she has. Her PMD is managing her warfarin, no  bleeding or signs of bleeding just had labs done Offered to re-establish with sleep team, she declined Planned to update her echo  Echo not done  She saw Dr. Johney Frame 02/27/21, feeling reasonably well, not very active, rate controlled, labile INRs, discussed alternative OAC (failing Eliquis historically), watchman, she did not want to make changes   No visits since that I can find She has had some vascular evals/US 2023, no DVT (right done only)  TODAY She is doing well overall She has lymphedema, but they are the best they have been in years right now She comes in a wheelchair but ambulates in her home. No falls No CP, palpitations, or cardiac awareness of any kind No SOB, DOE No bleeding or signs of bleeding Tolerating Eliquis this go around.  She sees her PMD team regularly, has labs there She would like to transition to Dr. 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 Central jet. Aortic Valve appears to be mildly sclerotic. Trace aortic regurgitation.EF >55%    Past Surgical History:  Procedure Laterality Date   APPENDECTOMY  1971   CARDIOVERSION N/A 06/23/2013   Procedure: CARDIOVERSION;  Surgeon: Lennette Bihari, MD;  Location: [facility] ENDOSCOPY;  Service: Cardiovascular;  Laterality: N/A;   CATARACT EXTRACTION W/ INTRAOCULAR LENS IMPLANT Bilateral    CESAREAN SECTION  1971   PLACEMENT OF BREAST IMPLANTS Bilateral 1985    Current Outpatient Medications  Medication Sig Dispense Refill   ACCU-CHEK COMPACT PLUS test strip CHECK BLOOD SUGAR TWICE DAILY OR AS DIRECTED  0   acetaminophen (TYLENOL) 500 MG tablet Take 1 tablet (500 mg total) by mouth every  4 (four) hours as needed for headache or mild pain (pain). 30 tablet 0   apixaban (ELIQUIS) 5 MG TABS tablet Take 1 tablet (5 mg total) by mouth 2 (two) times daily. 60 tablet 3   Ascorbic Acid (VITAMIN C) 1000 MG tablet Take 0.5 tablets (500 mg total) by mouth daily.     cholecalciferol (VITAMIN D3) 25 MCG (1000 UT) tablet Take 1,000 Units by mouth daily.     diltiazem (CARDIZEM CD) 360 MG 24 hr capsule TAKE 1 CAPSULE BY MOUTH EVERY DAY IN THE MORNING (PLEASE KEEP UPCOMING APPOINTMENT FOR FUTURE REFILLS.) THANK YOU 11 capsule 0   ezetimibe (ZETIA) 10 MG tablet Take 10 mg by mouth at bedtime.      ferrous sulfate 325 (65 FE) MG tablet Take 1 tablet (325 mg total) by mouth 2 (two) times daily with a meal. 60 tablet 3   folic acid (FOLVITE) 1 MG tablet Take 1 mg by mouth every morning.      furosemide (LASIX) 40 MG tablet Take 1 tablet (40 mg total) by mouth 2 (two) times daily. 30 tablet 3   gabapentin (NEURONTIN) 300 MG capsule Take 1 capsule (300 mg total) by mouth 3 (three) times daily. 90 capsule 2   guaiFENesin (MUCINEX) 600 MG 12 hr tablet Take 1 tablet (600 mg total) by mouth 2 (two) times daily.     hydrALAZINE (APRESOLINE) 25 MG tablet Take 1 tablet (25 mg total) by mouth 3 (three) times daily.     LANTUS SOLOSTAR 100 UNIT/ML Solostar Pen Inject 42 Units into the skin daily. Called [facility]- they state that last prescription that was picked up was ordered as Lantus 55 units daily 15 mL 11   magnesium oxide (MAG-OX) 400 MG tablet Take 1 tablet (400 mg total) by mouth daily. 30 tablet 5   metFORMIN (GLUCOPHAGE) 1000 MG tablet Take 1,000 mg by mouth 2 (two) times daily with a meal.     metoprolol succinate (TOPROL-XL) 50 MG 24 hr tablet TAKE 3 TABLETS BY MOUTH DAILY. TAKE WITH OR IMMEDIATELY FOLLOWING A MEAL. MUST KEEP SCHEDULED APPOINTMENT FOR FUTURE REFILLS. THANK YOU. 90 tablet 0   MYRBETRIQ 50 MG TB24 tablet Take 50 mg by mouth daily.     nystatin ointment (MYCOSTATIN) Apply topically 2 (two)  times daily. 30 g 0   potassium chloride (KLOR-CON) 10 MEQ tablet TAKE 2 TABLETS BY MOUTH DAILY 60 tablet 0   QUEtiapine (SEROQUEL) 25 MG tablet Take 1 tablet (25 mg total) by mouth at bedtime.     sertraline (ZOLOFT) 100 MG tablet Take 100 mg by mouth every morning.     telmisartan (MICARDIS) 80 MG tablet Take 80 mg by mouth daily.     vitamin B-12 (CYANOCOBALAMIN) 1000 MCG tablet Take 1 tablet (1,000 mcg total) by mouth every morning. 30 tablet 3   VITAMIN E PO Take 1 capsule by mouth once  a week.     No current facility-administered medications for this visit.    Allergies:   Rosuvastatin calcium, Statins, Amoxicillin, Atorvastatin, Ezetimibe-simvastatin, and Penicillins   Social History:  The patient  reports that she has never smoked. She has been exposed to tobacco smoke. She has never used smokeless tobacco. She reports current alcohol use. She reports that she does not use drugs.   Family History:  The patient's family history includes Cancer - Lung in her father; Cancer - Other in her maternal grandmother; Cardiomyopathy in her mother; Diabetes in her maternal grandfather; Heart attack in her mother; Stroke in her paternal grandfather.  ROS:  Please see the history of present illness.  All other systems are reviewed and otherwise negative.   PHYSICAL EXAM:  VS:  There were no vitals taken for this visit. BMI: There is no height or weight on file to calculate BMI.  O2 sat re-measured 94-95% RA Well nourished, well developed, in no acute distress  HEENT: normocephalic, atraumatic  Neck: no JVD, carotid bruits or masses Cardiac: irreg-irreg; no significant murmurs, no rubs, or gallops Lungs: CTA b/l, no wheezing, rhonchi or rales  Abd: soft, nontender MS: no deformity or atrophy Ext:  lymph/brawny edema, slight erythema, no wounds  Skin: warm and dry, no rash Neuro:  No gross deficits appreciated Psych: euthymic mood, full affect   EKG:  Done today and reviewed by myself  shows  AFib 76bpm, motion artifact   09/09/2016: TTE Study Conclusions - Left ventricle: The cavity size was normal. There was mild   concentric hypertrophy. Systolic function was normal. The   estimated ejection fraction was in the range of 55% to 60%. Wall   motion was normal; there were no regional wall motion   abnormalities. Features are consistent with a pseudonormal left   ventricular filling pattern, with concomitant abnormal relaxation   and increased filling pressure (grade 2 diastolic dysfunction). - Aortic valve: There was trivial regurgitation. - Mitral valve: Mildly calcified annulus. - Left atrium: The atrium was severely dilated.   Recent Labs: 04/30/2022: Hemoglobin 11.7; Platelets 378 05/04/2022: Magnesium 1.6 05/05/2022: BUN 10; Creatinine, Ser 0.72; Potassium 3.2; Sodium 137  No results found for requested labs within last 365 days.   CrCl cannot be calculated (Patient's most recent lab result is older than the maximum 21 days allowed.).   Wt Readings from Last 3 Encounters:  04/29/22 260 lb 2.3 oz (118 kg)  04/11/22 262 lb (118.8 kg)  03/12/22 259 lb 1.6 oz (117.5 kg)     Other studies reviewed: Additional studies/records reviewed today include: summarized above  ASSESSMENT AND PLAN:  1. Persistent >>> longstanding persistent >> permanent AFib     CHA2DS2Vasc is 5, on Eliquis, appropriately dosed     Unaware of her AF     Rate controlled    2. HTN     She is out of her diltiazem and metoprolol     She reports better with all her meds   3. Secondary hypercoagulable state    Disposition: back in 68mo to see Dr. Nelly Laurence, sooner if needed    Current medicines are reviewed at length with the patient today.  The patient did not have any concerns regarding medicines.  Norma Fredrickson, PA-C 04/23/2023 7:09 AM     [facility] [address] [place] [address] [phone] (office)  [phone] (fax)

## 2023-04-23 NOTE — Patient Instructions (Addendum)
Medication Instructions:   Your physician recommends that you continue on your current medications as directed. Please refer to the Current Medication list given to you today.  *If you need a refill on your cardiac medications before your next appointment, please call your pharmacy*   Lab Work: NONE ORDERED  TODAY   If you have labs (blood work) drawn today and your tests are completely normal, you will receive your results only by: MyChart Message (if you have MyChart) OR A paper copy in the mail If you have any lab test that is abnormal or we need to change your treatment, we will call you to review the results.   Testing/Procedures: NONE ORDERED  TODAY     Follow-Up: At [facility], you and your health needs are our priority.  As part of our continuing mission to provide you with exceptional heart care, we have created designated Provider Care Teams.  These Care Teams include your primary Cardiologist (physician) and Advanced Practice Providers (APPs -  Physician Assistants and Nurse Practitioners) who all work together to provide you with the care you need, when you need it.  We recommend signing up for the patient portal called "MyChart".  Sign up information is provided on this After Visit Summary.  MyChart is used to connect with patients for Virtual Visits (Telemedicine).  Patients are able to view lab/test results, encounter notes, upcoming appointments, etc.  Non-urgent messages can be sent to your provider as well.   To learn more about what you can do with MyChart, go to [email or web address].    Your next appointment:    6 month(s)  Provider:   Augustus Mealor, MD    Other Instructions  

## 2023-05-18 ENCOUNTER — Other Ambulatory Visit: Payer: Self-pay | Admitting: Urology

## 2023-05-18 DIAGNOSIS — N3021 Other chronic cystitis with hematuria: Secondary | ICD-10-CM

## 2023-05-26 ENCOUNTER — Telehealth: Payer: Self-pay | Admitting: Family Medicine

## 2023-05-26 NOTE — Telephone Encounter (Signed)
Pt called looking to get established as an NP with Dr. Carmelia Roller. After pulling up her chart, noted that pt was dismissed from our practice back on 01/16/04. When asked about this, pt stated that she was seeing Dr. Drue Novel at the time and that there was some disagreement in Plan of Care. Also noted that pt was dismissed from [facility] due to multiple No Shows per Care Everywhere note from 8.20.24.   Per conversation with Marylene Land, was advised to send a note back with all info to see if Dr. Carmelia Roller would be willing to take her on as a pt.  Advised pt that a note would be sent back to look into approval for NP establishment and we'd call her once we had an answer. Pt acknowledged understanding.   Please Advise.

## 2023-05-26 NOTE — Telephone Encounter (Signed)
Not taking new patients >40 without a referral. Ty.

## 2023-06-01 ENCOUNTER — Telehealth: Payer: Self-pay | Admitting: Cardiovascular Disease

## 2023-06-01 NOTE — Telephone Encounter (Signed)
*  STAT* If patient is at the pharmacy, call can be transferred to refill team.   1. Which medications need to be refilled? (please list name of each medication and dose if known) metFORMIN (GLUCOPHAGE) 1000 MG tablet MYRBETRIQ 50 MG TB24 tablet  2. Which pharmacy/location (including street and city if local pharmacy) is medication to be sent to? [id number] [place],  - [address]  3. Do they need a 30 day or 90 day supply?   90 day supply + patient would like to know if Dr. Nelly Laurence can take over [facility] prescription. She states she is completely out of it.

## 2023-06-09 NOTE — Telephone Encounter (Signed)
Attempted to contact patient, left message to contact PCP for medications that aren't cardiac related. If have any cardiology needs to contact our office back.

## 2023-07-17 ENCOUNTER — Encounter: Payer: Self-pay | Admitting: Family Medicine

## 2023-07-17 ENCOUNTER — Ambulatory Visit (INDEPENDENT_AMBULATORY_CARE_PROVIDER_SITE_OTHER): Payer: Medicare Other | Admitting: Family Medicine

## 2023-07-17 VITALS — BP 132/80 | HR 97 | Temp 97.6°F | Ht 63.0 in | Wt 204.0 lb

## 2023-07-17 DIAGNOSIS — Z794 Long term (current) use of insulin: Secondary | ICD-10-CM

## 2023-07-17 DIAGNOSIS — E1165 Type 2 diabetes mellitus with hyperglycemia: Secondary | ICD-10-CM | POA: Diagnosis not present

## 2023-07-17 DIAGNOSIS — I890 Lymphedema, not elsewhere classified: Secondary | ICD-10-CM | POA: Diagnosis not present

## 2023-07-17 DIAGNOSIS — Z79899 Other long term (current) drug therapy: Secondary | ICD-10-CM

## 2023-07-17 DIAGNOSIS — N390 Urinary tract infection, site not specified: Secondary | ICD-10-CM

## 2023-07-17 DIAGNOSIS — F5101 Primary insomnia: Secondary | ICD-10-CM

## 2023-07-17 DIAGNOSIS — Z6836 Body mass index (BMI) 36.0-36.9, adult: Secondary | ICD-10-CM

## 2023-07-17 DIAGNOSIS — Z789 Other specified health status: Secondary | ICD-10-CM

## 2023-07-17 DIAGNOSIS — E66812 Obesity, class 2: Secondary | ICD-10-CM

## 2023-07-17 DIAGNOSIS — R809 Proteinuria, unspecified: Secondary | ICD-10-CM

## 2023-07-17 DIAGNOSIS — E782 Mixed hyperlipidemia: Secondary | ICD-10-CM

## 2023-07-17 DIAGNOSIS — E1129 Type 2 diabetes mellitus with other diabetic kidney complication: Secondary | ICD-10-CM

## 2023-07-17 DIAGNOSIS — F32A Depression, unspecified: Secondary | ICD-10-CM

## 2023-07-17 DIAGNOSIS — I4811 Longstanding persistent atrial fibrillation: Secondary | ICD-10-CM

## 2023-07-17 DIAGNOSIS — N3281 Overactive bladder: Secondary | ICD-10-CM

## 2023-07-17 DIAGNOSIS — T424X5S Adverse effect of benzodiazepines, sequela: Secondary | ICD-10-CM

## 2023-07-17 MED ORDER — MYRBETRIQ 50 MG PO TB24
50.0000 mg | ORAL_TABLET | Freq: Every day | ORAL | 0 refills | Status: DC
Start: 2023-07-17 — End: 2023-10-15

## 2023-07-17 MED ORDER — TIRZEPATIDE 12.5 MG/0.5ML ~~LOC~~ SOAJ: 12.5000 mg | SUBCUTANEOUS | 0 refills | Status: DC

## 2023-07-17 NOTE — Patient Instructions (Signed)
-   Continue to use compression wraps and massage your legs to manage lymphedema as advised by your specialist. - We refilled your Mounjaro 12.5 mg for diabetes - We refilled  Myrbetriq 50 mg for OAB - For insomnia, we referred to sleep specialities to discuss medication options and zolpidem use.  - We are checking labs and urine as discussed

## 2023-07-17 NOTE — Progress Notes (Unsigned)
Assessment/Plan:  Total time spent caring for the patient today was 146 minutes. This includes time spent before the visit reviewing the chart, time spent during the visit, and time spent after the visit on documentation, etc.  Problem List Items Addressed This Visit       Cardiovascular and Mediastinum   Atrial fibrillation (HCC) - Primary    Managed with multiple medications; adherence and dosages are uncertain.  Plan: Confirm antihypertensive and antiarrhythmic medications. Monitor blood pressure and heart rate. Ensure appropriate anticoagulation if indicated. Emphasize medication adherence.  Encourage cardiology follow-up; reevaluate medications after reconciliation.      Relevant Orders   CBC with Differential/Platelet (Completed)   TSH (Completed)   AMB Referral [facility]I Care Management     Endocrine   Uncontrolled type 2 diabetes mellitus with hyperglycemia (HCC)    History of diabetes managed with mounjaro, metformin and insulin glargine; current usage is unclear.  Plan: Refill mounjaro 12.5 mg weekly Clarify current diabetes medications. Order hemoglobin A1c to assess control. Adjust treatment based on lab results and confirmed medications. Offer dietary counseling.  Review labs at the next appointment.      Relevant Medications   tirzepatide (MOUNJARO) 12.5 MG/0.5ML Pen   Other Relevant Orders   Comprehensive metabolic panel (Completed)   Hemoglobin A1c (Completed)   Microalbumin / creatinine urine ratio (Completed)   AMB Referral [facility]BCI Care Management     Genitourinary   Recurrent UTI   Relevant Orders   Urinalysis w microscopic + reflex cultur (Completed)   Overactive bladder    Managed with mirabegron; requesting refill; reports urinary symptoms. Plan: Refill mirabegron as prescribed. Obtain urinalysis and culture to assess for infection. Advise increased fluid intake. Monitor symptoms      Relevant Medications   MYRBETRIQ 50 MG TB24 tablet      Other   Mixed hyperlipidemia   Relevant Orders   Lipid panel (Completed)   AMB Referral [facility]BCI Care Management   Depression    Will associated Anxiety. Reports taking zolpidem, lorazepam, sertraline and quetiapine; details are unclear.  Plan: Verify psychiatric medications and dosages. Assess mood and cognitive function. Monitor for side effects;  Consider referral to psychiatry for management.      Insomnia    Managed with zolpidem 5?mg nightly; requesting refill.  Plan: Discuss risks of long-term zolpidem use in older adults. Abstain from zolpidem refill  Referral to a sleep specialist. Consider non-pharmacological interventions.      Relevant Orders   Ambulatory referral to Sleep Studies   AMB Referral VBCI Care Management   Obesity   Relevant Medications   tirzepatide (MOUNJARO) 12.5 MG/0.5ML Pen   Medication management    Has difficulty recalling medications, raising concerns about polypharmacy and potential adverse effects.  Plan: Conduct a comprehensive medication reconciliation. Obtain prior medical records and pharmacy fill history. Verify current medications with the patient's pharmacy. Educate on maintaining an updated medication list. Simplify the regimen once accurate information is available. Follow-Up: Schedule in two weeks; ask the patient to bring all medications.      Relevant Orders   AMB Referral VBCI Care Management   Lymphedema    Chronic leg swelling; new dark spot on right leg.  Plan: Review lymphedema management and prior imaging. Consider Refer to a lymphedema specialist. Monitor skin changes;  Consider dermatology referral. reassess management at next visit.      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  Plan: Reevaluate the need for benzodiazepines and hypnotics. Discuss long-term risks in older adults. Explore  non-pharmacological options for anxiety and insomnia. Consider tapering if appropriate. Follow-Up: Monitor for withdrawal symptoms; provide education on sleep hygiene and anxiety management.       Medications Discontinued During This Encounter  Medication Reason   tirzepatide (MOUNJARO) 7.5 MG/0.5ML Pen    MYRBETRIQ 50 MG TB24 tablet Reorder    Return in about 4 weeks (around 08/14/2023) for DM.    Subjective:   Encounter date: 07/17/2023  Amy Wright is a 82 y.o. female who has Persistent atrial fibrillation (HCC); Anticoagulated on Coumadin; DM2 (diabetes mellitus, type 2) (HCC); Mixed hyperlipidemia; Essential hypertension; Obstructive sleep apnea syndrome; Atrial fibrillation (HCC); Breast pain, left; Depression; Frequency of urination; Hx of long term use of blood thinners; Insomnia; Left foot pain; Microscopic hematuria; Obesity; Recurrent UTI; Uncontrolled type 2 diabetes mellitus with hyperglycemia (HCC); Vaginitis; Hyperlipemia; Hypertriglyceridemia; Atrial fibrillation with RVR (HCC); Left-sided epistaxis; Long term current use of anticoagulant therapy; Acute blood loss anemia; Medication management; Coagulopathy (HCC); AMS (altered mental status); Lower extremity edema; BMI 37.0-37.9, adult; Candidiasis of skin; Encephalopathy acute; Swelling of right hand; Acute encephalopathy; Lymphedema; Overactive bladder; and Benzodiazepine-based tranquilizers causing adverse effect in therapeutic use on their problem list..   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   Chief Complaint: Medication refills and evaluation of leg swelling.  Medication reconciliation was challenging due to the patient's uncertainty about their medication history, raising concerns for potential  polypharmacy and adverse effects.  The patient endorses use of multiple medications, including:  Mood and Insomnia:  Lorazepam 0.5?mg: Last filled on 02/09/2023; used as needed for occasional anxiety. Zolpidem tartrate 5?mg: Last filled on 11/25/2022; taken nightly for longstanding insomnia; a refill has been requested. Sertraline: Taken for mood; she is unsure if she is taking and at what dosage and frequency unspecified. Quetiapine: details unclear if she is taking.  Diabetes:  Metformin: Unsure if currently taking; previously used for type 2 diabetes mellitus. Lantus (insulin glargine): Previously at a dose of 42 units; unclear if currently in use. States she sometimes uses it and saved it for a "bad day".  Mounjaro at 12.5?mg weekly: Reports Effective for diabetes management but the patient has been out of medication for two weeks; experiencing increased appetite and worsening constipation since stopping.  Atrial Fibrillation/Heart Failure/Hypertension Diltiazem: 360 mg daily. Taken for atrial fibrillation and hypertension; dosage unspecified. Metoprolol 50?mg twice daily:  Potassium chloride: Endorsed daily usage.  Telmisartan 80 mg taken daily Taken for hypertension; specifics unknown. Hydralazine: 25 mg TID   OAB:  Myrbetriq (mirabegron): 50 mg daily Recently out. Reports urinary urgency and symptoms. She is concerned of of a urinary tract infection. Has history of recurrent UTI.   Chronic lymphedema: Managed with compression and massage; a new dark spot noticed on the right leg.  Review of Systems  Constitutional:  Negative for fever.  Cardiovascular:  Negative for chest pain.  Genitourinary:  Negative for flank pain.  Endo/Heme/Allergies:  Negative for polydipsia.  All other systems reviewed and are negative.   Past Surgical History:  Procedure Laterality Date   APPENDECTOMY  1971   CARDIOVERSION N/A 06/23/2013   Procedure: CARDIOVERSION;  Surgeon: Lennette Bihari, MD;   Location[facility];  Service: Cardiovascular;  Laterality: N/A;   CATARACT EXTRACTION W/  INTRAOCULAR LENS IMPLANT Bilateral    CESAREAN SECTION  1971   PLACEMENT OF BREAST IMPLANTS Bilateral 1985    Outpatient Medications Prior to Visit  Medication Sig Dispense Refill   acetaminophen (TYLENOL) 500 MG tablet Take 1 tablet (500 mg total) by mouth every 4 (four) hours as needed for headache or mild pain (pain). 30 tablet 0   apixaban (ELIQUIS) 5 MG TABS tablet Take 1 tablet (5 mg total) by mouth 2 (two) times daily. 180 tablet 3   Ascorbic Acid (VITAMIN C) 1000 MG tablet Take 0.5 tablets (500 mg total) by mouth daily.     cholecalciferol (VITAMIN D3) 25 MCG (1000 UT) tablet Take 1,000 Units by mouth daily.     diltiazem (CARDIZEM CD) 360 MG 24 hr capsule TAKE 1 CAPSULE BY MOUTH EVERY DAY IN THE MORNING 90 capsule 3   ezetimibe (ZETIA) 10 MG tablet Take 10 mg by mouth at bedtime.      ferrous sulfate 325 (65 FE) MG tablet Take 1 tablet (325 mg total) by mouth 2 (two) times daily with a meal. 60 tablet 3   folic acid (FOLVITE) 1 MG tablet Take 1 mg by mouth every morning.      furosemide (LASIX) 40 MG tablet Take 1 tablet (40 mg total) by mouth 2 (two) times daily. 90 tablet 3   guaiFENesin (MUCINEX) 600 MG 12 hr tablet Take 1 tablet (600 mg total) by mouth 2 (two) times daily.     hydrALAZINE (APRESOLINE) 25 MG tablet Take 1 tablet (25 mg total) by mouth 3 (three) times daily.     LANTUS SOLOSTAR 100 UNIT/ML Solostar Pen Inject 42 Units into the skin daily. Called [facility]- they state that last prescription that was picked up was ordered as Lantus 55 units daily 15 mL 11   magnesium oxide (MAG-OX) 400 MG tablet Take 1 tablet (400 mg total) by mouth daily. 30 tablet 5   metFORMIN (GLUCOPHAGE) 1000 MG tablet Take 1,000 mg by mouth 2 (two) times daily with a meal.     metoprolol succinate (TOPROL-XL) 50 MG 24 hr tablet Take 3 tablets (150 mg total) by mouth daily. Take with or immediately following a  meal. 270 tablet 3   nystatin ointment (MYCOSTATIN) Apply topically 2 (two) times daily. 30 g 0   potassium chloride (KLOR-CON) 10 MEQ tablet Take 2 tablets (20 mEq total) by mouth daily. 180 tablet 3   QUEtiapine (SEROQUEL) 25 MG tablet Take 1 tablet (25 mg total) by mouth at bedtime.     sertraline (ZOLOFT) 100 MG tablet Take 100 mg by mouth every morning.     telmisartan (MICARDIS) 80 MG tablet Take 1 tablet (80 mg total) by mouth daily. 90 tablet 3   vitamin B-12 (CYANOCOBALAMIN) 1000 MCG tablet Take 1 tablet (1,000 mcg total) by mouth every morning. 30 tablet 3   VITAMIN E PO Take 1 capsule by mouth once a week.     MYRBETRIQ 50 MG TB24 tablet Take 50 mg by mouth daily.     tirzepatide (MOUNJARO) 7.5 MG/0.5ML Pen Inject 7.5 mg into the skin once a week. Pt reports 12.5 mg 07/17/2023     No facility-administered medications prior to visit.    Family History  Problem Relation Age of Onset   Heart attack Mother    Cardiomyopathy Mother    Cancer - Lung Father    Cancer - Other Maternal Grandmother    Stroke Paternal Grandfather    Diabetes Maternal Grandfather  Social History   Socioeconomic History   Marital status: Married    Spouse name: Not on file   Number of children: Not on file   Years of education: Not on file   Highest education level: Not on file  Occupational History   Not on file  Tobacco Use   Smoking status: Never    Passive exposure: Yes   Smokeless tobacco: Never  Vaping Use   Vaping status: Never Used  Substance and Sexual Activity   Alcohol use: Yes    Comment: 08/09/2013 "nothing to drink in >4 yrs; never had a problem w/it"   Drug use: No   Sexual activity: Not Currently  Other Topics Concern   Not on file  Social History Narrative   Not on file   Social Determinants of Health   Financial Resource Strain: Not on file  Food Insecurity: Low Risk  (01/13/2023)   [facility]   Hunger Vital Sign    Worried About  Running Out of Food in the Last Year: Never true    Ran Out of Food in the Last Year: Never true  Transportation Needs: Not on file (01/13/2023)  Physical Activity: Not on file  Stress: Not on file  Social Connections: Not on file  Intimate Partner Violence: Not on file                                                                                                  Objective:  Physical Exam: BP 132/80 (BP Location: Left Arm, Patient Position: Sitting, Cuff Size: Large)   Pulse 97   Temp 97.6 F (36.4 C) (Temporal)   Ht 5\' 3"  (1.6 m)   Wt 204 lb (92.5 kg)   SpO2 97%   BMI 36.14 kg/m     Physical Exam Constitutional:      General: She is not in acute distress.    Appearance: Normal appearance. She is not ill-appearing or toxic-appearing.  HENT:     Head: Normocephalic and atraumatic.     Nose: Nose normal. No congestion.  Eyes:     General: No scleral icterus.    Extraocular Movements: Extraocular movements intact.  Cardiovascular:     Rate and Rhythm: Normal rate and regular rhythm.     Pulses: Normal pulses.     Heart sounds: Normal heart sounds.  Pulmonary:     Effort: Pulmonary effort is normal. No respiratory distress.     Breath sounds: Normal breath sounds.  Abdominal:     General: Abdomen is flat. Bowel sounds are normal.     Palpations: Abdomen is soft.  Musculoskeletal:        General: Normal range of motion.  Lymphadenopathy:     Cervical: No cervical adenopathy.  Skin:    General: Skin is warm and dry.     Findings: No rash.  Neurological:     General: No focal deficit present.     Mental Status: She is alert and oriented to person, place, and time.     Cranial Nerves: Cranial nerves 2-12 are intact.  Psychiatric:        Mood and Affect: Affect is not inappropriate.        Speech: Speech is tangential.        Behavior: Behavior is cooperative.        Judgment: Judgment is not impulsive.     No results found.        Garner Nash,  MD, MS

## 2023-07-18 LAB — MICROALBUMIN / CREATININE URINE RATIO
Creatinine, Urine: 106.5 mg/dL
Microalb/Creat Ratio: 2077 mg/g{creat} — ABNORMAL HIGH (ref 0–29)
Microalbumin, Urine: 2212 ug/mL

## 2023-07-20 DIAGNOSIS — N3281 Overactive bladder: Secondary | ICD-10-CM | POA: Insufficient documentation

## 2023-07-20 DIAGNOSIS — T424X5A Adverse effect of benzodiazepines, initial encounter: Secondary | ICD-10-CM | POA: Insufficient documentation

## 2023-07-20 LAB — COMPREHENSIVE METABOLIC PANEL
ALT: 12 [IU]/L (ref 0–32)
AST: 20 [IU]/L (ref 0–40)
Albumin: 4.2 g/dL (ref 3.8–4.8)
Alkaline Phosphatase: 89 IU/L (ref 44–121)
BUN/Creatinine Ratio: 21 (ref 12–28)
BUN: 15 mg/dL (ref 8–27)
Bilirubin Total: 0.2 mg/dL (ref 0.0–1.2)
CO2: 19 mmol/L — ABNORMAL LOW (ref 20–29)
Calcium: 10.2 mg/dL (ref 8.7–10.3)
Chloride: 101 mmol/L (ref 96–106)
Creatinine, Ser: 0.73 mg/dL (ref 0.57–1.00)
Globulin, Total: 3.1 g/dL (ref 1.5–4.5)
Glucose: 168 mg/dL — ABNORMAL HIGH (ref 70–99)
Potassium: 4.6 mmol/L (ref 3.5–5.2)
Sodium: 138 mmol/L (ref 134–144)
Total Protein: 7.3 g/dL (ref 6.0–8.5)
eGFR: 83 mL/min/{1.73_m2} (ref >59)

## 2023-07-20 LAB — LIPID PANEL
Chol/HDL Ratio: 3.9 ratio (ref 0.0–4.4)
Cholesterol, Total: 195 mg/dL (ref 100–199)
HDL: 50 mg/dL (ref >39)
LDL Chol Calc (NIH): 103 mg/dL — ABNORMAL HIGH (ref 0–99)
Triglycerides: 249 mg/dL — ABNORMAL HIGH (ref 0–149)
VLDL Cholesterol Cal: 42 mg/dL — ABNORMAL HIGH (ref 5–40)

## 2023-07-20 LAB — CBC WITH DIFFERENTIAL/PLATELET

## 2023-07-20 LAB — SPECIMEN STATUS REPORT

## 2023-07-20 LAB — HEMOGLOBIN A1C

## 2023-07-20 LAB — TSH: TSH: 2.55 u[IU]/mL (ref 0.450–4.500)

## 2023-07-20 MED ORDER — SULFAMETHOXAZOLE-TRIMETHOPRIM 800-160 MG PO TABS: 1.0000 | ORAL_TABLET | Freq: Two times a day (BID) | ORAL | 0 refills | Status: AC

## 2023-07-20 NOTE — Assessment & Plan Note (Signed)
Potential overuse of lorazepam and zolpidem increases sedation and fall risk.  Plan: Reevaluate the need for benzodiazepines and hypnotics. Discuss long-term risks in older adults. Explore non-pharmacological options for anxiety and insomnia. Consider tapering if appropriate. Follow-Up: Monitor for withdrawal symptoms; provide education on sleep hygiene and anxiety management.

## 2023-07-20 NOTE — Assessment & Plan Note (Signed)
History of diabetes managed with mounjaro, metformin and insulin glargine; current usage is unclear.  Plan: Refill mounjaro 12.5 mg weekly Clarify current diabetes medications. Order hemoglobin A1c to assess control. Adjust treatment based on lab results and confirmed medications. Offer dietary counseling.  Review labs at the next appointment.

## 2023-07-20 NOTE — Assessment & Plan Note (Addendum)
Managed with mirabegron; requesting refill; reports urinary symptoms. Plan: Refill mirabegron as prescribed. Obtain urinalysis and culture to assess for infection. Advise increased fluid intake. Monitor symptoms

## 2023-07-20 NOTE — Assessment & Plan Note (Signed)
Chronic leg swelling; new dark spot on right leg.  Plan: Review lymphedema management and prior imaging. Consider Refer to a lymphedema specialist. Monitor skin changes;  Consider dermatology referral. reassess management at next visit.

## 2023-07-20 NOTE — Assessment & Plan Note (Addendum)
Will associated Anxiety. Reports taking zolpidem, lorazepam, sertraline and quetiapine; details are unclear.  Plan: Verify psychiatric medications and dosages. Assess mood and cognitive function. Monitor for side effects;  Consider referral to psychiatry for management.

## 2023-07-20 NOTE — Assessment & Plan Note (Signed)
Managed with multiple medications; adherence and dosages are uncertain.  Plan: Confirm antihypertensive and antiarrhythmic medications. Monitor blood pressure and heart rate. Ensure appropriate anticoagulation if indicated. Emphasize medication adherence.  Encourage cardiology follow-up; reevaluate medications after reconciliation.

## 2023-07-20 NOTE — Assessment & Plan Note (Signed)
Managed with zolpidem 5?mg nightly; requesting refill.  Plan: Discuss risks of long-term zolpidem use in older adults. Abstain from zolpidem refill  Referral to a sleep specialist. Consider non-pharmacological interventions.

## 2023-07-20 NOTE — Assessment & Plan Note (Signed)
Has difficulty recalling medications, raising concerns about polypharmacy and potential adverse effects.  Plan: Conduct a comprehensive medication reconciliation. Obtain prior medical records and pharmacy fill history. Verify current medications with the patient's pharmacy. Educate on maintaining an updated medication list. Simplify the regimen once accurate information is available. Follow-Up: Schedule in two weeks; ask the patient to bring all medications.

## 2023-07-21 ENCOUNTER — Telehealth: Payer: Self-pay

## 2023-07-21 ENCOUNTER — Other Ambulatory Visit: Payer: Self-pay

## 2023-07-21 DIAGNOSIS — E1165 Type 2 diabetes mellitus with hyperglycemia: Secondary | ICD-10-CM

## 2023-07-21 DIAGNOSIS — I4811 Longstanding persistent atrial fibrillation: Secondary | ICD-10-CM

## 2023-07-21 NOTE — Progress Notes (Unsigned)
   Care Guide Note  07/21/2023 Name: Amy Wright MRN: [id number] DOB: 08-Oct-1940  Referred by: Garnette Gunner, MD Reason for referral : Care Coordination (Outreach to schedule with Pharm d )   Amy Wright is a 82 y.o. year old female who is a primary care patient of Garnette Gunner, MD. Amy Wright was referred to the pharmacist for assistance related to Atrial Fibrillation and DM.    An unsuccessful telephone outreach was attempted today to contact the patient who was referred to the pharmacy team for assistance with medication management. Additional attempts will be made to contact the patient.   Penne Lash, RMA Care Guide [facility]  [place], [place] [address] Direct Dial: [phone] [email or web address]

## 2023-07-22 LAB — URINE CULTURE
MICRO NUMBER:: 15710376
SPECIMEN QUALITY:: ADEQUATE

## 2023-07-22 LAB — URINALYSIS W MICROSCOPIC + REFLEX CULTURE
Bilirubin Urine: NEGATIVE
Glucose, UA: NEGATIVE
Ketones, ur: NEGATIVE
Nitrites, Initial: NEGATIVE
Specific Gravity, Urine: 1.023 (ref 1.001–1.035)
pH: 5.5 (ref 5.0–8.0)

## 2023-07-22 LAB — CULTURE INDICATED

## 2023-07-23 NOTE — Progress Notes (Signed)
   Care Guide Note  07/23/2023 Name: Amy Wright MRN: [id number] DOB: [birth year]  Referred by: Garnette Gunner, MD Reason for referral : Care Coordination (Outreach to schedule with Pharm d )   Amy Wright is a 82 y.o. year old female who is a primary care patient of Garnette Gunner, MD. Amy Wright was referred to the pharmacist for assistance related to Atrial Fibrillation and DM.    A second unsuccessful telephone outreach was attempted today to contact the patient who was referred to the pharmacy team for assistance with medication assistance. Additional attempts will be made to contact the patient.  Penne Lash, RMA Care Guide [facility]  [place], [place] [address] Direct Dial: [phone] [email or web address]

## 2023-07-24 MED ORDER — NITROFURANTOIN MONOHYD MACRO 100 MG PO CAPS: 100.0000 mg | ORAL_CAPSULE | Freq: Two times a day (BID) | ORAL | 0 refills | Status: DC

## 2023-07-24 NOTE — Addendum Note (Signed)
Addended by: Fanny Bien B on: 07/24/2023 11:07 AM   Modules accepted: Orders

## 2023-07-27 ENCOUNTER — Telehealth: Payer: Self-pay

## 2023-07-27 NOTE — Telephone Encounter (Signed)
-----   Message from Terrill B sent at 07/27/2023  8:35 AM EST ----- Access nurse

## 2023-07-27 NOTE — Progress Notes (Signed)
   Care Guide Note  07/27/2023 Name: [name] MRN: [id number] DOB: Sep 20, 1940  Referred by: Garnette Gunner, MD Reason for referral : Care Coordination (Outreach to schedule with Pharm d )   Amy Wright is a 82 y.o. year old female who is a primary care patient of Garnette Gunner, MD. Amy Wright was referred to the pharmacist for assistance related to Atrial Fibrillation and DM.    A third unsuccessful telephone outreach was attempted today to contact the patient who was referred to the pharmacy team for assistance with medication assistance. The Population Health team is pleased to engage with this patient at any time in the future upon receipt of referral and should he/she be interested in assistance from the [facility] team.   Amy Wright, RMA Care Guide [facility]  [place], [place] [address] Direct Dial: [phone] [email or web address]

## 2023-07-27 NOTE — Telephone Encounter (Signed)
Left patient a detailed voice message to return call to office with any concerns regarding bactrim that was prescribed.Marland Kitchen

## 2023-08-03 ENCOUNTER — Encounter: Payer: Self-pay | Admitting: Cardiovascular Disease

## 2023-08-03 ENCOUNTER — Ambulatory Visit: Payer: Medicare Other | Attending: Cardiovascular Disease | Admitting: Cardiovascular Disease

## 2023-08-03 VITALS — BP 110/74 | HR 79 | Ht 63.0 in | Wt 201.2 lb

## 2023-08-03 DIAGNOSIS — I4821 Permanent atrial fibrillation: Secondary | ICD-10-CM

## 2023-08-03 NOTE — Progress Notes (Signed)
  Electrophysiology Office Note:    Date:  08/03/2023   ID:  Amy Wright, DOB [birth year], MRN [id number]  PCP:  Garnette Gunner, MD   [facility] Providers Cardiologist:  None     Referring MD: Bernadette Hoit, MD   History of Present Illness:    Amy Wright is a 82 y.o. female with a medical history significant for atrial fibrillation, hypertension, hyperlipidemia who presents for EP follow-up.      She is a former patient of Dr. Johney Frame.  She was on Tikosyn previously but began to have breakthrough A-fib.  She she did not want to consider amiodarone since her sister had a bad experience with that.  She was deemed not to be a candidate for ablation and was managed with a rate control approach.  She has now labeled with permanent atrial fibrillation     Today, she reports that she is at baseline.   EKGs/Labs/Other Studies Reviewed Today:       EKG:   EKG Interpretation Date/Time:  Monday August 03 2023 11:58:06 EST Ventricular Rate:  79 PR Interval:    QRS Duration:  78 QT Interval:  394 QTC Calculation: 451 R Axis:   75  Text Interpretation: Atrial fibrillation T wave abnormality, consider inferior ischemia When compared with ECG of 23-Apr-2023 14:52, No significant change was found Confirmed by York Pellant [phone]) on 08/03/2023 1:42:59 PM     Physical Exam:    VS:  BP 110/74 (BP Location: Left Arm, Patient Position: Sitting, Cuff Size: Large)   Pulse 79   Ht 5\' 3"  (1.6 m)   Wt 201 lb 3.2 oz (91.3 kg)   SpO2 96%   BMI 35.64 kg/m     Wt Readings from Last 3 Encounters:  08/03/23 201 lb 3.2 oz (91.3 kg)  07/17/23 204 lb (92.5 kg)  04/23/23 205 lb 14.4 oz (93.4 kg)     GEN:  Well nourished, well developed in no acute distress CARDIAC: iRRR, no murmurs, rubs, gallops RESPIRATORY:  Normal work of breathing MUSCULOSKELETAL: bilateral edema    ASSESSMENT & PLAN:     Permanent atrial fibrillation She appears to be  asymptomatic Rates are controlled with diltiazem 360 mg and metoprolol 150 mg  Hypertension BP controlled today  Secondary hypercoagulable state Continue apixaban 5mg  BID Labs 07/17/2023 reviewed      Signed, Maurice Small, MD  08/03/2023 1:43 PM    [facility]

## 2023-08-03 NOTE — Patient Instructions (Addendum)
Medication Instructions:  Your physician recommends that you continue on your current medications as directed. Please refer to the Current Medication list given to you today. *If you need a refill on your cardiac medications before your next appointment, please call your pharmacy*   Follow-Up: At [facility], you and your health needs are our priority.  As part of our continuing mission to provide you with exceptional heart care, we have created designated Provider Care Teams.  These Care Teams include your primary Cardiologist (physician) and Advanced Practice Providers (APPs -  Physician Assistants and Nurse Practitioners) who all work together to provide you with the care you need, when you need it.  We recommend signing up for the patient portal called "MyChart".  Sign up information is provided on this After Visit Summary.  MyChart is used to connect with patients for Virtual Visits (Telemedicine).  Patients are able to view lab/test results, encounter notes, upcoming appointments, etc.  Non-urgent messages can be sent to your provider as well.   To learn more about what you can do with MyChart, go to [email or web address].    Your next appointment:   1 year(s)  Provider:   General cardiology

## 2023-08-05 ENCOUNTER — Telehealth: Payer: Self-pay | Admitting: Cardiovascular Disease

## 2023-08-05 ENCOUNTER — Other Ambulatory Visit: Payer: Self-pay

## 2023-08-05 MED ORDER — METFORMIN HCL 1000 MG PO TABS: 1000.0000 mg | ORAL_TABLET | Freq: Two times a day (BID) | ORAL | 3 refills | Status: DC

## 2023-08-05 NOTE — Telephone Encounter (Signed)
RX sent to requested Pharmacy

## 2023-08-05 NOTE — Telephone Encounter (Signed)
*  STAT* If patient is at the pharmacy, call can be transferred to refill team.   1. Which medications need to be refilled? (please list name of each medication and dose if known) metFORMIN (GLUCOPHAGE) 1000 MG tablet   2. Which pharmacy/location (including street and city if local pharmacy) is medication to be sent to? metFORMIN (GLUCOPHAGE) 1000 MG tablet  3. Do they need a 30 day or 90 day supply? 90 Pt is completely out

## 2023-08-30 ENCOUNTER — Other Ambulatory Visit: Payer: Self-pay | Admitting: Physician Assistant

## 2023-10-06 ENCOUNTER — Other Ambulatory Visit: Payer: Self-pay | Admitting: Family Medicine

## 2023-10-06 DIAGNOSIS — E1165 Type 2 diabetes mellitus with hyperglycemia: Secondary | ICD-10-CM

## 2023-10-06 NOTE — Telephone Encounter (Signed)
Requesting: tirzepatide Greggory Keen) 12.5 MG/0.5ML Pen  Last Visit: 07/17/2023 Next Visit: Visit date not found Last Refill: 07/17/2023  Please Advise

## 2023-10-06 NOTE — Telephone Encounter (Signed)
Copied from CRM [id number]. Topic: Clinical - Medication Refill >> Oct 06, 2023 11:36 AM Shelbie Proctor wrote: Most Recent Primary Care Visit:  Provider: Garnette Gunner  Department: [facility]  Visit Type: NEW PATIENT  Date: 07/17/2023  Medication: Mounjaro 12.5mg  inject once a week  Has the patient contacted their pharmacy? No (Agent: If no, request that the patient contact the pharmacy for the refill. If patient does not wish to contact the pharmacy document the reason why and proceed with request.) (Agent: If yes, when and what did the pharmacy advise?)  Is this the correct pharmacy for this prescription? Yes If no, delete pharmacy and type the correct one.  This is the patient's preferred pharmacy:  [facility] - [place], [place] - [address] [place] [address] Phone: [phone] Fax: [phone]   Has the prescription been filled recently? No  Is the patient out of the medication? No, patient will be out of medication after this week.   Has the patient been seen for an appointment in the last year OR does the patient have an upcoming appointment? LOV 07/17/23 Patient states she doing fine, does not need to come in the office and will make an appointment later on.  Can we respond through MyChart? No, please call at [phone]  Agent: Please be advised that Rx refills may take up to 3 business days. We ask that you follow-up with your pharmacy.

## 2023-10-09 ENCOUNTER — Encounter: Payer: Self-pay | Admitting: Family Medicine

## 2023-10-13 ENCOUNTER — Telehealth: Payer: Self-pay | Admitting: Family Medicine

## 2023-10-13 NOTE — Telephone Encounter (Signed)
 Spoke with Amy Wright at [facility] and advised her fax was received and per Dr. Sebastian an appointment is required with the patient. I advised her that patient is scheduled on 10/15/23 with Dr. Sebastian and we can fax over up to date OV notes and prescription after it has been signed.

## 2023-10-13 NOTE — Telephone Encounter (Signed)
 Copied from CRM [id number]. Topic: General - Other >> Oct 13, 2023 11:40 AM Joanell NOVAK wrote: Reason for CRM: Katie called from [facility] to follow up on a fax sent over for the pt and would like a callback to confirm the papework was recieved. Callback number [phone].

## 2023-10-15 ENCOUNTER — Ambulatory Visit (INDEPENDENT_AMBULATORY_CARE_PROVIDER_SITE_OTHER): Payer: Medicare Other | Admitting: Family Medicine

## 2023-10-15 ENCOUNTER — Encounter: Payer: Self-pay | Admitting: Family Medicine

## 2023-10-15 VITALS — BP 124/82 | HR 74 | Temp 97.7°F | Wt 197.2 lb

## 2023-10-15 DIAGNOSIS — N3281 Overactive bladder: Secondary | ICD-10-CM

## 2023-10-15 DIAGNOSIS — E1165 Type 2 diabetes mellitus with hyperglycemia: Secondary | ICD-10-CM

## 2023-10-15 DIAGNOSIS — Z23 Encounter for immunization: Secondary | ICD-10-CM

## 2023-10-15 DIAGNOSIS — I4811 Longstanding persistent atrial fibrillation: Secondary | ICD-10-CM

## 2023-10-15 DIAGNOSIS — Z7984 Long term (current) use of oral hypoglycemic drugs: Secondary | ICD-10-CM

## 2023-10-15 DIAGNOSIS — I10 Essential (primary) hypertension: Secondary | ICD-10-CM

## 2023-10-15 DIAGNOSIS — I890 Lymphedema, not elsewhere classified: Secondary | ICD-10-CM

## 2023-10-15 LAB — POCT GLYCOSYLATED HEMOGLOBIN (HGB A1C): Hemoglobin A1C: 7.3 % — AB (ref 4.0–5.6)

## 2023-10-15 MED ORDER — MYRBETRIQ 50 MG PO TB24: 50.0000 mg | ORAL_TABLET | Freq: Every day | ORAL | 0 refills | Status: DC

## 2023-10-15 MED ORDER — FREESTYLE LIBRE 3 PLUS SENSOR MISC: 3 refills | Status: AC

## 2023-10-15 MED ORDER — TIRZEPATIDE 15 MG/0.5ML ~~LOC~~ SOAJ: 15.0000 mg | SUBCUTANEOUS | 3 refills | Status: DC

## 2023-10-15 MED ORDER — METFORMIN HCL 1000 MG PO TABS: 1000.0000 mg | ORAL_TABLET | Freq: Two times a day (BID) | ORAL | 3 refills | Status: AC

## 2023-10-15 NOTE — Telephone Encounter (Signed)
 Polly Brink is  calling in again needing the paperwork signed and sent back over for patient to get her medical supplies

## 2023-10-15 NOTE — Progress Notes (Signed)
 Assessment/Plan:   Problem List Items Addressed This Visit       Cardiovascular and Mediastinum   Essential hypertension (Chronic)   Atrial fibrillation (HCC)     Endocrine   Uncontrolled type 2 diabetes mellitus with hyperglycemia (HCC) - Primary   Relevant Medications   tirzepatide  (MOUNJARO ) 15 MG/0.5ML Pen   Continuous Glucose Sensor (FREESTYLE LIBRE 3 PLUS SENSOR) MISC   metFORMIN  (GLUCOPHAGE ) 1000 MG tablet   Other Relevant Orders   POCT glycosylated hemoglobin (Hb A1C) (Completed)     Genitourinary   Overactive bladder   Relevant Medications   MYRBETRIQ  50 MG TB24 tablet     Other   Lymphedema   Other Visit Diagnoses       Immunization due       Relevant Orders   Flu Vaccine Trivalent High Dose (Fluad) (Completed)   Zoster Recombinant (Shingrix  ) (Completed)     Assessment and Plan    Type 2 Diabetes Mellitus Type 2 Diabetes Mellitus with improved glycemic control. Hemoglobin A1c decreased from 8.8% to 7.3%. Currently on tirzepatide  12.5 mg weekly and metformin  1000 mg twice daily. Reports current dose of tirzepatide  is less effective recently and is ready to increase to 15 mg. Using [facility] for glucose monitoring. Discussed increasing tirzepatide  to 15 mg weekly to further reduce A1c below 7% and potentially lower BMI from 34. Goal is better glycemic control and weight management. - Increase tirzepatide  to 15 mg weekly - Refill metformin  1000 mg twice daily - Continue using Freestyle Libre for glucose monitoring - Follow up in 3 months for diabetes management  Hypertension Hypertension well-controlled with current medications. Blood pressure today is 124/82 mmHg. On telmisartan , hydralazine , diltiazem , furosemide , and metoprolol . No chest pain or shortness of breath reported. - Continue current antihypertensive regimen including telmisartan , hydralazine , diltiazem , furosemide , and metoprolol   Heart Failure Heart failure well-managed on current  regimen. No reported chest pain or shortness of breath. - Continue current heart failure management regimen  Atrial Fibrillation Atrial fibrillation well-controlled with metoprolol  and diltiazem . - Continue metoprolol  and diltiazem   Overactive Bladder Overactive bladder well-controlled with Myrbetriq  50 mg daily. On this medication for three years with significant improvement in urinary symptoms. No excessive thirst or other urinary symptoms reported. - Refill Myrbetriq  50 mg daily  Lymphedema Lymphedema with some residual swelling but overall improvement. - Continue current management for lymphedema  General Health Maintenance Inquired about receiving the flu shot and shingles shot. Advised it is safe to receive both in one arm and to take Tylenol  if soreness occurs. - Administer flu shot - Administer shingles shot - Advise to take Tylenol  for soreness if needed  Follow-up - Follow up in 3 months for diabetes management - Continue to follow with cardiology for heart failure and atrial fibrillation management.        Medications Discontinued During This Encounter  Medication Reason   LANTUS  SOLOSTAR 100 UNIT/ML Solostar Pen    tirzepatide  (MOUNJARO ) 12.5 MG/0.5ML Pen    tirzepatide  (MOUNJARO ) 12.5 MG/0.5ML Pen    MYRBETRIQ  50 MG TB24 tablet Reorder   guaiFENesin  (MUCINEX ) 600 MG 12 hr tablet    metFORMIN  (GLUCOPHAGE ) 1000 MG tablet Reorder    Return in about 3 months (around 01/12/2024) for DM, BP.    Subjective:   Encounter date: 10/15/2023  Tegan Britain is a 83 y.o. female who has Persistent atrial fibrillation (HCC); Anticoagulated on Coumadin ; DM2 (diabetes mellitus, type 2) (HCC); Mixed hyperlipidemia; Essential hypertension; Obstructive sleep apnea syndrome; Atrial fibrillation (  HCC); Breast pain, left; Depression; Frequency of urination; Hx of long term use of blood thinners; Insomnia; Left foot pain; Microscopic hematuria; Obesity; Recurrent UTI; Uncontrolled  type 2 diabetes mellitus with hyperglycemia (HCC); Vaginitis; Hyperlipemia; Hypertriglyceridemia; Atrial fibrillation with RVR (HCC); Left-sided epistaxis; Long term current use of anticoagulant therapy; Acute blood loss anemia; Medication management; Coagulopathy (HCC); AMS (altered mental status); Lower extremity edema; BMI 37.0-37.9, adult; Candidiasis of skin; Encephalopathy acute; Swelling of right hand; Acute encephalopathy; Lymphedema; Overactive bladder; and Benzodiazepine-based tranquilizers causing adverse effect in therapeutic use on their problem list..   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   She presents with chief complaint of Medical Management of Chronic Issues (Follow up DM. [facility] sensors. Increase mounjaro  to 15mg ) .  Discussed the use of AI scribe software for clinical note transcription with the patient, who gave verbal consent to proceed.  History of Present Illness   Shakemia Madera is an 83 year old female with diabetes who presents for a follow-up visit and glucose sensor setup.  She has been on tirzepatide  12.5 mg once a week for about eight months, but it has been less effective in the past month. Her current diabetes regimen also includes metformin  1000 mg twice a day. Her A1c has improved from 8.8% to 7.3%, and she has lost four pounds recently. She uses the [employer] sensor for glucose monitoring, which is being updated to a new model.  She is mindful of her physical activity and diet, particularly focusing on a low-carb diet. She engages in activities such as playing with her Schnauzer dog and using an exercise bike.  She has a history of atrial fibrillation and heart failure.  She denies any recent episodes of rapid heart rate or  palpitations.  Patient has history of overactive bladder.  Symptoms well-managed on Myrbetriq .  No symptoms of urinary frequency, polyuria, or dysuria.  No excessive thirst, chest pain, or shortness of breath. Blood pressure is stable and no recent use of Lantus  insulin , only using it occasionally if needed.     Review of Systems  Constitutional:  Negative for chills, diaphoresis, fever, malaise/fatigue and weight loss.  HENT:  Negative for congestion, ear discharge, ear pain and hearing loss.   Eyes:  Negative for blurred vision, double vision, photophobia, pain, discharge and redness.  Respiratory:  Negative for cough, sputum production, shortness of breath and wheezing.   Cardiovascular:  Negative for chest pain and palpitations.  Gastrointestinal:  Negative for abdominal pain, blood in stool, constipation, diarrhea, heartburn, melena, nausea and vomiting.  Genitourinary:  Negative for dysuria, flank pain, frequency, hematuria and urgency.  Musculoskeletal:  Negative for myalgias.  Skin:  Negative for itching and rash.  Neurological:  Negative for dizziness, tingling, tremors, speech change, seizures, loss of consciousness, weakness and headaches.  Endo/Heme/Allergies:  Negative for polydipsia.  Psychiatric/Behavioral:  Negative for depression, hallucinations, memory loss, substance abuse and suicidal ideas. The patient does not have insomnia.   All other systems reviewed and are negative.   Past Surgical History:  Procedure Laterality Date   APPENDECTOMY  1971   CARDIOVERSION N/A 06/23/2013   Procedure: CARDIOVERSION;  Surgeon: Debby DELENA Sor, MD;  Location[facility] ENDOSCOPY;  Service: Cardiovascular;  Laterality: N/A;   CATARACT EXTRACTION W/ INTRAOCULAR LENS IMPLANT Bilateral    CESAREAN SECTION  1971   PLACEMENT OF BREAST  IMPLANTS Bilateral 1985    Outpatient Medications Prior to Visit  Medication Sig Dispense Refill   acetaminophen  (TYLENOL ) 500 MG tablet Take 1 tablet (500 mg  total) by mouth every 4 (four) hours as needed for headache or mild pain (pain). 30 tablet 0   apixaban  (ELIQUIS ) 5 MG TABS tablet Take 1 tablet (5 mg total) by mouth 2 (two) times daily. 180 tablet 3   Ascorbic Acid (VITAMIN C ) 1000 MG tablet Take 0.5 tablets (500 mg total) by mouth daily.     cholecalciferol  (VITAMIN D3) 25 MCG (1000 UT) tablet Take 1,000 Units by mouth daily.     diltiazem  (CARDIZEM  CD) 360 MG 24 hr capsule TAKE 1 CAPSULE BY MOUTH EVERY DAY IN THE MORNING 90 capsule 3   ezetimibe  (ZETIA ) 10 MG tablet Take 10 mg by mouth at bedtime.      ferrous sulfate  325 (65 FE) MG tablet Take 1 tablet (325 mg total) by mouth 2 (two) times daily with a meal. 60 tablet 3   folic acid  (FOLVITE ) 1 MG tablet Take 1 mg by mouth every morning.      furosemide  (LASIX ) 40 MG tablet TAKE 1 TABLET BY MOUTH TWICE A DAY 180 tablet 3   hydrALAZINE  (APRESOLINE ) 25 MG tablet Take 1 tablet (25 mg total) by mouth 3 (three) times daily.     magnesium  oxide (MAG-OX) 400 MG tablet Take 1 tablet (400 mg total) by mouth daily. 30 tablet 5   metoprolol  succinate (TOPROL -XL) 50 MG 24 hr tablet Take 3 tablets (150 mg total) by mouth daily. Take with or immediately following a meal. 270 tablet 3   nystatin  ointment (MYCOSTATIN ) Apply topically 2 (two) times daily. 30 g 0   potassium chloride  (KLOR-CON ) 10 MEQ tablet Take 2 tablets (20 mEq total) by mouth daily. 180 tablet 3   QUEtiapine  (SEROQUEL ) 25 MG tablet Take 1 tablet (25 mg total) by mouth at bedtime.     sertraline  (ZOLOFT ) 100 MG tablet Take 100 mg by mouth every morning.     telmisartan  (MICARDIS ) 80 MG tablet Take 1 tablet (80 mg total) by mouth daily. 90 tablet 3   vitamin B-12 (CYANOCOBALAMIN ) 1000 MCG tablet Take 1 tablet (1,000 mcg total) by mouth every morning. 30 tablet 3   VITAMIN E PO Take 1 capsule by mouth once a week.     LANTUS  SOLOSTAR 100 UNIT/ML Solostar Pen Inject 42 Units into the skin daily. Called [facility]- they state that last prescription  that was picked up was ordered as Lantus  55 units daily 15 mL 11   metFORMIN  (GLUCOPHAGE ) 1000 MG tablet Take 1 tablet (1,000 mg total) by mouth 2 (two) times daily with a meal. 180 tablet 3   MYRBETRIQ  50 MG TB24 tablet Take 1 tablet (50 mg total) by mouth daily. 90 tablet 0   tirzepatide  (MOUNJARO ) 12.5 MG/0.5ML Pen Inject 12.5 mg into the skin once a week. 6 mL 0   tirzepatide  (MOUNJARO ) 12.5 MG/0.5ML Pen Inject 12.5 mg into the skin once a week.     guaiFENesin  (MUCINEX ) 600 MG 12 hr tablet Take 1 tablet (600 mg total) by mouth 2 (two) times daily. (Patient not taking: Reported on 10/15/2023)     No facility-administered medications prior to visit.    Family History  Problem Relation Age of Onset   Heart attack Mother    Cardiomyopathy Mother    Cancer - Lung Father    Cancer - Other Maternal Grandmother    Stroke  Paternal Grandfather    Diabetes Maternal Grandfather     Social History   Socioeconomic History   Marital status: Married    Spouse name: Not on file   Number of children: Not on file   Years of education: Not on file   Highest education level: Not on file  Occupational History   Not on file  Tobacco Use   Smoking status: Never    Passive exposure: Yes   Smokeless tobacco: Never  Vaping Use   Vaping status: Never Used  Substance and Sexual Activity   Alcohol use: Yes    Comment: 08/09/2013 nothing to drink in >4 yrs; never had a problem w/it   Drug use: No   Sexual activity: Not Currently  Other Topics Concern   Not on file  Social History Narrative   Not on file   Social Drivers of Health   Financial Resource Strain: Not on file  Food Insecurity: Low Risk  (01/13/2023)   [facility]   Hunger Vital Sign    Worried About Running Out of Food in the Last Year: Never true    Ran Out of Food in the Last Year: Never true  Transportation Needs: Not on file (01/13/2023)  Physical Activity: Not on file  Stress: Not on file   Social Connections: Not on file  Intimate Partner Violence: Not on file                                                                                                  Objective:  Physical Exam: BP 124/82   Pulse 74   Temp 97.7 F (36.5 C) (Temporal)   Wt 197 lb 3.2 oz (89.4 kg)   SpO2 97%   BMI 34.93 kg/m   Wt Readings from Last 3 Encounters:  10/15/23 197 lb 3.2 oz (89.4 kg)  08/03/23 201 lb 3.2 oz (91.3 kg)  07/17/23 204 lb (92.5 kg)     Physical Exam Constitutional:      General: She is not in acute distress.    Appearance: Normal appearance. She is not ill-appearing or toxic-appearing.  HENT:     Head: Normocephalic and atraumatic.     Nose: Nose normal. No congestion.  Eyes:     General: No scleral icterus.    Extraocular Movements: Extraocular movements intact.  Cardiovascular:     Rate and Rhythm: Normal rate and regular rhythm.     Pulses: Normal pulses.     Heart sounds: Normal heart sounds.     Comments: Chronic lower extremity lymphedema.  Unchanged. Pulmonary:     Effort: Pulmonary effort is normal. No respiratory distress.     Breath sounds: Normal breath sounds.  Abdominal:     General: Abdomen is flat. Bowel sounds are normal.     Palpations: Abdomen is soft.  Musculoskeletal:        General: Normal range of motion.     Right lower leg: Edema present.     Left lower leg: Edema present.  Lymphadenopathy:     Cervical: No cervical adenopathy.  Skin:  General: Skin is warm and dry.     Findings: No rash.  Neurological:     General: No focal deficit present.     Mental Status: She is alert and oriented to person, place, and time. Mental status is at baseline.  Psychiatric:        Attention and Perception: She is attentive.        Behavior: Behavior is cooperative.     No results found.  Recent Results (from the past 2160 hours)  POCT glycosylated hemoglobin (Hb A1C)     Status: Abnormal   Collection Time: 10/15/23  4:22 PM  Result  Value Ref Range   Hemoglobin A1C 7.3 (A) 4.0 - 5.6 %   HbA1c POC (<> result, manual entry)     HbA1c, POC (prediabetic range)     HbA1c, POC (controlled diabetic range)          Beverley Adine Hummer, MD, MS

## 2023-10-15 NOTE — Patient Instructions (Addendum)
 VISIT SUMMARY:  Amy Wright, an 83 year old female with diabetes, visited for a follow-up and glucose sensor setup. Her diabetes management includes tirzepatide  and metformin , and she has seen improvements in her A1c and weight. She also manages hypertension, heart failure, atrial fibrillation, overactive bladder, and lymphedema with her current medications. During the visit, her glucose sensor was updated, and she received flu and shingles shots.  YOUR PLAN:  -TYPE 2 DIABETES MELLITUS: Type 2 Diabetes Mellitus is a condition where the body does not use insulin  properly, leading to high blood sugar levels. Your A1c has improved from 8.8% to 7.3%, but we will increase your tirzepatide  dose to 15 mg weekly to further reduce your A1c below 7% and help with weight management. Continue taking metformin  1000 mg twice daily and using the [facility] for glucose monitoring.  -HYPERTENSION: Hypertension is high blood pressure. Your blood pressure is well-controlled at 124/82 mmHg with your current medications. Continue taking telmisartan , hydralazine , diltiazem , furosemide , and metoprolol  as prescribed.  -ATRIAL FIBRILLATION: Atrial fibrillation is an irregular and often rapid heart rate. Your condition is well-controlled with metoprolol  and diltiazem . Continue taking these medications as prescribed.  -OVERACTIVE BLADDER: Overactive bladder is a condition where there is a frequent urge to urinate. Your symptoms are well-controlled with Myrbetriq  50 mg daily. Continue taking this medication.  -LYMPHEDEMA: Lymphedema is swelling that generally occurs in one of your arms or legs. You have some residual swelling but overall improvement. Continue your current management plan.  -GENERAL HEALTH MAINTENANCE: You received the flu shot and shingles shot today. It is safe to receive both in one arm. If you experience soreness, you can take Tylenol .  INSTRUCTIONS:  Follow up in 3 months for diabetes  management. Continue to follow with cardiology for atrial fibrillation management.

## 2023-10-15 NOTE — Telephone Encounter (Signed)
Patient currently in office. 

## 2023-10-15 NOTE — Telephone Encounter (Signed)
 Rx and OV notes faxed to [facility] today.

## 2023-10-22 DIAGNOSIS — E1165 Type 2 diabetes mellitus with hyperglycemia: Secondary | ICD-10-CM | POA: Diagnosis not present

## 2023-12-21 ENCOUNTER — Other Ambulatory Visit: Payer: Self-pay | Admitting: Family Medicine

## 2023-12-21 DIAGNOSIS — N3281 Overactive bladder: Secondary | ICD-10-CM

## 2023-12-21 NOTE — Telephone Encounter (Signed)
 Copied from CRM [id number]. Topic: Clinical - Medication Refill >> Dec 21, 2023 10:14 AM Albertha Alosa wrote: Most Recent Primary Care Visit:  Provider: Catheryn Cluck  Department: [facility]  Visit Type: OFFICE VISIT  Date: 10/15/2023  Medication: apixaban (ELIQUIS) 5 MG TABS table MYRBETRIQ 50 MG TB24 tablet  diltiazem (CARDIZEM CD) 360 MG 24 hr capsule ezetimibe (ZETIA) 10 MG tablet   Has the patient contacted their pharmacy? Yes (Agent: If no, request that the patient contact the pharmacy for the refill. If patient does not wish to contact the pharmacy document the reason why and proceed with request.) (Agent: If yes, when and what did the pharmacy advise?)  Is this the correct pharmacy for this prescription? Yes If no, delete pharmacy and type the correct one.  This is the patient's preferred pharmacy:   [facility] - [place], [place] - [address] [place] [address] Phone: [phone] Fax: [phone]   Has the prescription been filled recently? No  Is the patient out of the medication? Yes  Has the patient been seen for an appointment in the last year OR does the patient have an upcoming appointment? Yes  Can we respond through MyChart? Yes  Agent: Please be advised that Rx refills may take up to 3 business days. We ask that you follow-up with your pharmacy.

## 2023-12-22 MED ORDER — EZETIMIBE 10 MG PO TABS: 10.0000 mg | ORAL_TABLET | Freq: Every day | ORAL | 3 refills | Status: AC

## 2023-12-22 MED ORDER — APIXABAN 5 MG PO TABS: 5.0000 mg | ORAL_TABLET | Freq: Two times a day (BID) | ORAL | 3 refills | Status: AC

## 2023-12-22 MED ORDER — MYRBETRIQ 50 MG PO TB24: 50.0000 mg | ORAL_TABLET | Freq: Every day | ORAL | 3 refills | Status: AC

## 2023-12-22 MED ORDER — DILTIAZEM HCL ER COATED BEADS 360 MG PO CP24: ORAL_CAPSULE | ORAL | 3 refills | Status: AC

## 2024-01-15 DIAGNOSIS — E1165 Type 2 diabetes mellitus with hyperglycemia: Secondary | ICD-10-CM | POA: Diagnosis not present

## 2024-04-12 ENCOUNTER — Telehealth: Payer: Self-pay

## 2024-04-12 NOTE — Telephone Encounter (Signed)
 Last OV was faxed with confirmation attached on 04/12/2024

## 2024-04-12 NOTE — Telephone Encounter (Signed)
 Copied from CRM [id number]. Topic: General - Other >> Apr 12, 2024 10:18 AM Amy Wright wrote: Reason for CRM: [facility] is calling because patient receives diabetic supplies through them. They would like to know if a request for most recent office visit notes and new prescription has been received from the office. They will be faxing it again today with contact information.

## 2024-04-12 NOTE — Telephone Encounter (Signed)
 Noted; form was received and required documentation will be faxed as requested.

## 2024-05-16 ENCOUNTER — Telehealth: Payer: Self-pay

## 2024-05-16 NOTE — Telephone Encounter (Signed)
 Copied from CRM [id number]. Topic: Clinical - Prescription Issue >> May 13, 2024 12:15 PM Pinkey ORN wrote: Reason for CRM: Continuous Glucose Sensor (FREESTYLE LIBRE 3 PLUS SENSOR) MISC >> May 13, 2024 12:17 PM Pinkey ORN wrote: Patient states [facility] hasn't received the requested information in regards to patient's prescription. Patient states this needs to be completely urgently, also when sending it has to have Sebastian Beverley NOVAK, MD name, address and a good contact number. Please follow up with patient in have further questions or concerns.

## 2024-05-16 NOTE — Telephone Encounter (Signed)
 Called patient to inform her that [facility] orders were faxed today with confirmation; left patient a brief VM and sent MyChart message

## 2024-05-17 ENCOUNTER — Other Ambulatory Visit: Payer: Self-pay | Admitting: Physician Assistant

## 2024-05-23 DIAGNOSIS — E1165 Type 2 diabetes mellitus with hyperglycemia: Secondary | ICD-10-CM | POA: Diagnosis not present

## 2024-05-23 DIAGNOSIS — Z794 Long term (current) use of insulin: Secondary | ICD-10-CM | POA: Diagnosis not present

## 2024-06-21 NOTE — Progress Notes (Signed)
 Amy Wright                                          MRN: [id number]   06/21/2024   The [facility] Quality Team Specialist reviewed this patient medical record for the purposes of chart review for care gap closure. The following were reviewed: chart review for care gap closure-kidney health evaluation for diabetes:eGFR  and uACR.    [facility] Quality Team

## 2024-07-05 ENCOUNTER — Telehealth: Payer: Self-pay

## 2024-07-05 NOTE — Telephone Encounter (Signed)
 Copied from CRM [id number]. Topic: Clinical - Medical Advice >> Jul 05, 2024  3:46 PM Brittany M wrote: Reason for CRM: Patient stating that we need to contact [facility] for a Libre 2 Plus sensor. Fax #[phone]  #[phone]

## 2024-07-05 NOTE — Telephone Encounter (Signed)
 Please see message below.   Thanks,  Nataliah Hatlestad CCMA

## 2024-07-07 NOTE — Telephone Encounter (Signed)
 Spoke to patient and she informed us  that her order w/ [facility] needs to be changed from the [employer] 2 to the Glucose Sensor (FREESTYLE LIBRE 3 PLUS SENSOR) MISC.

## 2024-07-08 NOTE — Telephone Encounter (Signed)
 Spoke to [facility] representative and they will be faxing over order to prescriptions change; fax and contact information was also given.

## 2024-07-15 ENCOUNTER — Other Ambulatory Visit: Payer: Self-pay | Admitting: Family Medicine

## 2024-07-15 NOTE — Telephone Encounter (Signed)
 Copied from CRM #[id number]. Topic: Clinical - Medication Refill >> Jul 15, 2024  2:34 PM China J wrote: Medication: sertraline  (ZOLOFT ) 100 MG tablet  Has the patient contacted their pharmacy? Yes (Agent: If no, request that the patient contact the pharmacy for the refill. If patient does not wish to contact the pharmacy document the reason why and proceed with request.) (Agent: If yes, when and what did the pharmacy advise?)  This is the patient's preferred pharmacy:  [facility] - [place], [place] - [address] [place] [address] Phone: [phone] Fax: [phone] Hours: Not open 24 hours  Is this the correct pharmacy for this prescription? Yes If no, delete pharmacy and type the correct one.   Has the prescription been filled recently? No  Is the patient out of the medication? No  Has the patient been seen for an appointment in the last year OR does the patient have an upcoming appointment? Yes  Can we respond through MyChart? No  Agent: Please be advised that Rx refills may take up to 3 business days. We ask that you follow-up with your pharmacy.

## 2024-07-15 NOTE — Telephone Encounter (Signed)
 Rx and LON was faxed to [facility] on 07/15/2024 with confirmation.

## 2024-07-27 NOTE — Progress Notes (Signed)
 Amy Wright                                          MRN: [id number]   07/27/2024   The [facility] Quality Team Specialist reviewed this patient medical record for the purposes of chart review for care gap closure. The following were reviewed: chart review for care gap closure-kidney health evaluation for diabetes:eGFR  and uACR.    [facility]I Quality Team

## 2024-08-12 ENCOUNTER — Ambulatory Visit: Admitting: Cardiology

## 2024-08-13 ENCOUNTER — Other Ambulatory Visit: Payer: Self-pay | Admitting: Family Medicine

## 2024-08-13 DIAGNOSIS — N3281 Overactive bladder: Secondary | ICD-10-CM

## 2024-08-30 ENCOUNTER — Other Ambulatory Visit: Payer: Self-pay | Admitting: Family Medicine

## 2024-08-30 ENCOUNTER — Telehealth: Payer: Self-pay

## 2024-08-30 DIAGNOSIS — E1165 Type 2 diabetes mellitus with hyperglycemia: Secondary | ICD-10-CM

## 2024-08-30 MED ORDER — TIRZEPATIDE 15 MG/0.5ML ~~LOC~~ SOAJ: 15.0000 mg | SUBCUTANEOUS | 0 refills | Status: AC

## 2024-08-30 NOTE — Telephone Encounter (Signed)
 RX already sent in today and appointment scheduled for 10/03/24 @ 1:20 pm. Dm/cma

## 2024-08-30 NOTE — Telephone Encounter (Signed)
 Requesting: tirzepatide  (MOUNJARO ) 15 MG/0.5ML Pen  Last Visit: 10/15/2023 Next Visit: Visit date not found Last Refill: 10/15/2023  Patient is overdue for an appointment Please Advise

## 2024-08-30 NOTE — Telephone Encounter (Signed)
 Copied from CRM #[id number]. Topic: Clinical - Medication Refill >> Aug 30, 2024 12:14 PM Shereese L wrote: Medication: tirzepatide  (MOUNJARO ) 15 MG/0.5ML Pen  Has the patient contacted their pharmacy? Yes (Agent: If no, request that the patient contact the pharmacy for the refill. If patient does not wish to contact the pharmacy document the reason why and proceed with request.) (Agent: If yes, when and what did the pharmacy advise?)  This is the patient's preferred pharmacy:  [facility] - [place], [place] - [address] [address] [place] [address] Phone: [phone] Fax: [phone]  Is this the correct pharmacy for this prescription? Yes If no, delete pharmacy and type the correct one.   Has the prescription been filled recently? Yes  Is the patient out of the medication? Yes  Has the patient been seen for an appointment in the last year OR does the patient have an upcoming appointment? Yes  Can we respond through MyChart? Yes  Agent: Please be advised that Rx refills may take up to 3 business days. We ask that you follow-up with your pharmacy.

## 2024-08-30 NOTE — Telephone Encounter (Signed)
 Copied from CRM #[id number]. Topic: Clinical - Medication Refill >> Aug 30, 2024 12:14 PM Shereese L wrote: Medication: tirzepatide  (MOUNJARO ) 15 MG/0.5ML Pen  Has the patient contacted their pharmacy? Yes (Agent: If no, request that the patient contact the pharmacy for the refill. If patient does not wish to contact the pharmacy document the reason why and proceed with request.) (Agent: If yes, when and what did the pharmacy advise?)  This is the patient's preferred pharmacy:  [facility] - [place], [place] - [address] [facility] [address] [place] [address] Phone: [phone] Fax: [phone]  Is this the correct pharmacy for this prescription? Yes If no, delete pharmacy and type the correct one.   Has the prescription been filled recently? Yes  Is the patient out of the medication? Yes  Has the patient been seen for an appointment in the last year OR does the patient have an upcoming appointment? Yes  Can we respond through MyChart? Yes  Agent: Please be advised that Rx refills may take up to 3 business days. We ask that you follow-up with your pharmacy. >> Aug 30, 2024  1:19 PM China J wrote: The patient is calling back because she assumed that the medication would be available by today. I let the patient know that there is a 3 business day turn around time. She acknowledges this turnaround time but wants to emphasize that the she is completely out of medication and does not want to miss a dose.   She would like a call from the medical assistant once the medication has been sent to her preferred pharmacy.

## 2024-09-22 DIAGNOSIS — D6869 Other thrombophilia: Secondary | ICD-10-CM | POA: Insufficient documentation

## 2024-09-22 NOTE — Progress Notes (Unsigned)
 " Cardiology Office Note:   Date:  09/22/2024  ID:  Amy Wright, DOB [birth year], MRN [id number] PCP: Sebastian Beverley NOVAK, MD  [facility] Providers Cardiologist:  None {  History of Present Illness:   Amy Wright is a 84 y.o. female with a history of atrial fib.  I have not seen her before.  She has seen EP and last saw  Dr. Glennon in 2024.  ***    with history of HTN, HLD, DM, anxiety, and AFib.   She saw Dr. Kelsie, April 2019, at that time on Tikosyn , having some paroxysms of Afib, and planned for DCCV.  The pt had not wanted to consider ablation or alternative AAD at that time.   Aug 2020 the patient reached out wanting to switch from coumadin  to Eliquis  (note mentions coumadin  was no longer bing made, presumably branded)   She developed nose bleeds with Eliquis , followed with ENT >> warfarin.   She saw Dr. Kelsie via Telehealth visit Jan 2021.  Discussed prior failure with Tikosyn  and pt did not want to consider amiodarone (her sister having a bad experience with the drug), not an ablation candidate and planned for rate control strategies, her Toprol  increased. Mentioned could consider surgical referral for MAZE/LAA ligation though the pt wanted to avoid that.   I saw her 12/10/20 She feels tired no energy. She mentions that in the last several weeks wakes after sleeping all night gets up and does a few things and once seated again for any length of time falls asleep for hours again. She stopped using CPAP probably 3 years ago, read bad things about the machines and felt like she struggled with it more then she slept with it. Not dizzy or lightheaded, but tired.  No CP, mentions that since I have put on this weight get winded very easily. No rest SOB She has marked brawny type edema that she tells me is not new for her and has a PRN lasix  (not noted on our med list), prescription via her PMD that she uses some weeks and not others, her PMD having given her instructions,  She was uncertain of the strength of the pills she has. Her PMD is managing her warfarin, no bleeding or signs of bleeding just had labs done Offered to re-establish with sleep team, she declined Planned to update her echo   Echo not done   She saw Dr. Kelsie 02/27/21, feeling reasonably well, not very active, rate controlled, labile INRs, discussed alternative OAC (failing Eliquis  historically), watchman, she did not want to make changes     No visits since that I can find She has had some vascular evals/US  2023, no DVT (right done only)    ROS: ***  Studies Reviewed:    EKG:       ***  Risk Assessment/Calculations:   {Does this patient have ATRIAL FIBRILLATION?:[phone]} No BP recorded.  {Refresh Note OR Click here to enter BP  :1}***        Physical Exam:   VS:  There were no vitals taken for this visit.   Wt Readings from Last 3 Encounters:  10/15/23 197 lb 3.2 oz (89.4 kg)  08/03/23 201 lb 3.2 oz (91.3 kg)  07/17/23 204 lb (92.5 kg)     GEN: Well nourished, well developed in no acute distress NECK: No JVD; No carotid bruits CARDIAC: ***RR, *** murmurs, rubs, gallops RESPIRATORY:  Clear to auscultation without rales, wheezing or rhonchi  ABDOMEN: Soft, non-tender, non-distended EXTREMITIES:  No edema; No deformity   ASSESSMENT AND PLAN:   ***    Permanent atrial fib:  ****  Secondary hypercoaguable state:  ***  HTN:  ***   Follow up ***  Signed, Lynwood Schilling, MD   "

## 2024-09-23 ENCOUNTER — Ambulatory Visit: Admitting: Cardiology

## 2024-09-23 DIAGNOSIS — D6869 Other thrombophilia: Secondary | ICD-10-CM

## 2024-09-23 DIAGNOSIS — I4821 Permanent atrial fibrillation: Secondary | ICD-10-CM

## 2024-09-23 DIAGNOSIS — I10 Essential (primary) hypertension: Secondary | ICD-10-CM

## 2024-09-26 ENCOUNTER — Encounter: Payer: Self-pay | Admitting: Family Medicine

## 2024-09-27 ENCOUNTER — Telehealth: Payer: Self-pay

## 2024-09-27 NOTE — Telephone Encounter (Signed)
 Patient needs a PA submitted for Mounjaro .

## 2024-09-28 ENCOUNTER — Other Ambulatory Visit (HOSPITAL_COMMUNITY): Payer: Self-pay

## 2024-09-28 ENCOUNTER — Telehealth: Payer: Self-pay

## 2024-09-28 NOTE — Telephone Encounter (Signed)
 Contacted patient to inform her that she didn't need a PA for her Mounjaro . Pt explained to me that her pharmacy told her she would have to pay $514. I contacted the pharmacy and spoke with Sham. He informed me the patient has not met her deductible being that it is a new year her prescription will cost her $514 until here deductible is met. Called the patient back and informed her of the information that was told to me by Sham at the [facility] Patient expressed understanding. Informed patient since she has [facility] Medicare, she can contact her insurance and let them know she will like to set up a prescription payment plan. Explained to her that with the prescription payment plan, she has 2 options. One option is she can set up a 2100 ded and pay $175 each month or she can pay $2100 in total and get her prescriptions at a total of $0 for the remainder of the year. Patient expressed understanding and stated she will contact her insurance. Nothing further needed at this time

## 2024-09-28 NOTE — Telephone Encounter (Signed)
 Pharmacy Patient Advocate Encounter   Received notification from Physician's Office that prior authorization for Mounjaro  15 mg/0.5 ml pen is required/requested.   Insurance verification completed.   The patient is insured through [employer].   Per test claim: Refill too soon. PA is not needed at this time. Medication was filled 09/23/2024. Next eligible fill date is 10/14/2024.

## 2024-10-03 ENCOUNTER — Ambulatory Visit: Admitting: Family Medicine

## 2024-10-03 ENCOUNTER — Telehealth: Payer: Self-pay | Admitting: Family Medicine

## 2024-10-03 NOTE — Telephone Encounter (Signed)
 Did not log into VV. Called.NA.LVM.

## 2024-10-03 NOTE — Progress Notes (Incomplete)
 Virtual Visit via Video Note  I connected with Amy Wright on 10/03/2024 at  1:20 PM EST by a video enabled telemedicine application and verified that I am speaking with the correct person using two identifiers.  Location: Patient: home Provider: office   I discussed the limitations of evaluation and management by telemedicine and the availability of in person appointments. The patient expressed understanding and agreed to proceed.    No chief complaint on file.   Discussed the use of AI scribe software for clinical note transcription with the patient, who gave verbal consent to proceed.  History of Present Illness           Type 2 Diabetes Mellitus - Managed with Tirzepatide  15 mg weekly and Metformin  1000 mg twice daily. - Last hgbA1c 7.3%% in February 2025, improved from 8.8%,  and last BMI was 34.93 with a weight of 197 pounds. - Using [facility] for glucose monitoring.     Hypertension - Well-controlled with current antihypertensive regimen including telmisartan  80 mg daily, hydralazine  25 mg TID, diltiazem  360 mg daily, furosemide  40 mg BID, and metoprolol  succinate 150 mg daily. - Last blood pressure was 124/82 mmHg.    Heart Failure - Heart failure well-managed on current regimen: Diltiazem  360 mg daily and Metoprolol  150?mg daily.     Atrial Fibrillation - Well-controlled with Diltiazem  360 mg daily and Metoprolol  150?mg daily. Also taking Eliquis  5 mg BID for anticoagulation.    Overactive Bladder - Managed with Myrbetriq  50 mg daily.    Observations/Objective: There were no vitals filed for this visit.  Gen: NAD, resting comfortably HEENT: EOMI Pulm: NWOB Skin: no rash on face Neuro: no facial asymmetry or dysmetria Psych: Normal affect   Assessment and Plan: Assessment and Plan                I discussed the assessment and treatment plan with the patient. The patient was provided an opportunity to ask questions and all were  answered. The patient agreed with the plan and demonstrated an understanding of the instructions.   The patient was advised to call back or seek an in-person evaluation if the symptoms worsen or if the condition fails to improve as anticipated.    Beverley KATHEE Hummer, MD  I,Emily Lagle,acting as a scribe for Beverley KATHEE Hummer, MD.,have documented all relevant documentation on the behalf of Beverley KATHEE Hummer, MD.  LILLETTE Beverley KATHEE Hummer, MD, have reviewed all documentation for this visit. The documentation on 10/03/2024 for the exam, diagnosis, procedures, and orders are all accurate and complete.

## 2024-10-11 ENCOUNTER — Other Ambulatory Visit: Payer: Self-pay | Admitting: Physician Assistant
